# Patient Record
Sex: Female | Born: 1975 | Race: Black or African American | Hispanic: No | Smoking: Former smoker
Health system: Southern US, Community
[De-identification: ages and names within clinical notes are randomized; demographics above are authoritative.]

## PROBLEM LIST (undated history)

## (undated) DIAGNOSIS — I10 Essential (primary) hypertension: Secondary | ICD-10-CM

## (undated) DIAGNOSIS — E7800 Pure hypercholesterolemia, unspecified: Secondary | ICD-10-CM

## (undated) DIAGNOSIS — D219 Benign neoplasm of connective and other soft tissue, unspecified: Secondary | ICD-10-CM

## (undated) DIAGNOSIS — M1990 Unspecified osteoarthritis, unspecified site: Secondary | ICD-10-CM

## (undated) DIAGNOSIS — M3500 Sicca syndrome, unspecified: Secondary | ICD-10-CM

## (undated) DIAGNOSIS — R51 Headache: Secondary | ICD-10-CM

## (undated) DIAGNOSIS — M549 Dorsalgia, unspecified: Secondary | ICD-10-CM

## (undated) DIAGNOSIS — M2550 Pain in unspecified joint: Secondary | ICD-10-CM

## (undated) DIAGNOSIS — M7989 Other specified soft tissue disorders: Secondary | ICD-10-CM

## (undated) DIAGNOSIS — E559 Vitamin D deficiency, unspecified: Secondary | ICD-10-CM

## (undated) HISTORY — DX: Other specified soft tissue disorders: M79.89

## (undated) HISTORY — DX: Dorsalgia, unspecified: M54.9

## (undated) HISTORY — DX: Sjogren syndrome, unspecified: M35.00

## (undated) HISTORY — DX: Pain in unspecified joint: M25.50

## (undated) HISTORY — DX: Benign neoplasm of connective and other soft tissue, unspecified: D21.9

## (undated) HISTORY — DX: Vitamin D deficiency, unspecified: E55.9

## (undated) HISTORY — PX: BACK SURGERY: SHX140

## (undated) HISTORY — DX: Pure hypercholesterolemia, unspecified: E78.00

## (undated) HISTORY — PX: DILATION AND CURETTAGE OF UTERUS: SHX78

## (undated) HISTORY — PX: ABDOMINAL HYSTERECTOMY: SHX81

---
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
## 2006-02-16 ENCOUNTER — Emergency Department (HOSPITAL_COMMUNITY): Admission: EM | Admit: 2006-02-16 | Discharge: 2006-02-16 | Payer: Self-pay | Admitting: Emergency Medicine

## 2006-03-25 ENCOUNTER — Emergency Department (HOSPITAL_COMMUNITY): Admission: EM | Admit: 2006-03-25 | Discharge: 2006-03-25 | Payer: Self-pay | Admitting: Emergency Medicine

## 2006-03-29 ENCOUNTER — Ambulatory Visit (HOSPITAL_COMMUNITY): Admission: RE | Admit: 2006-03-29 | Discharge: 2006-03-29 | Payer: Self-pay | Admitting: Obstetrics & Gynecology

## 2006-03-29 ENCOUNTER — Encounter (INDEPENDENT_AMBULATORY_CARE_PROVIDER_SITE_OTHER): Payer: Self-pay | Admitting: Specialist

## 2006-12-18 ENCOUNTER — Emergency Department (HOSPITAL_COMMUNITY): Admission: EM | Admit: 2006-12-18 | Discharge: 2006-12-19 | Payer: Self-pay | Admitting: Emergency Medicine

## 2006-12-18 IMAGING — CT CT HEAD W/O CM
1 series · 16 of 30 positions shown, 20 images · IV contrast (agent unspecified)
Comparison: none

CLINICAL DATA: 11-weeks pregnant with frontal headache for two days.  History of hypertension.
 HEAD CT WITHOUT CONTRAST:
TECHNIQUE: Contiguous axial images were obtained from the base of the skull through the vertex according to standard protocol without contrast.  The patient was shielded with three lead aprons.

[Series 2: headseq 4.8 h37s · axial · 0.47mm/px · z∈[+142,+277]mm · 16 of 30 slices shown, 20 images]
[im 2/30  brain]
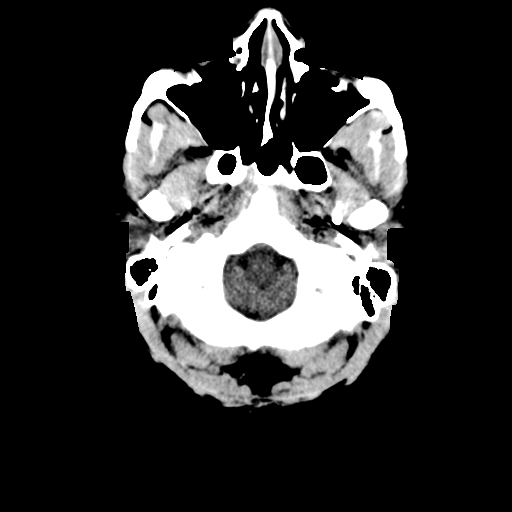
[im 2/30  bone]
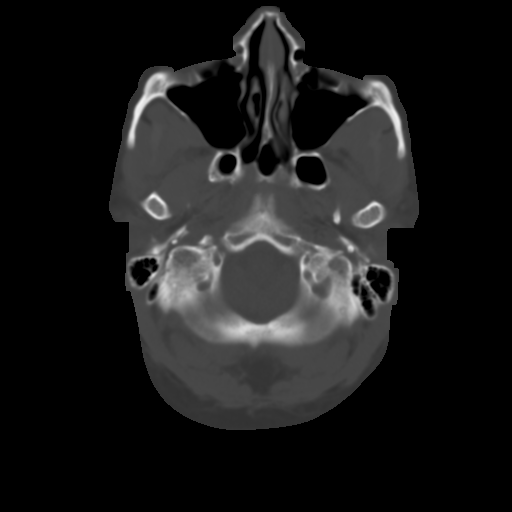
[im 4/30  brain]
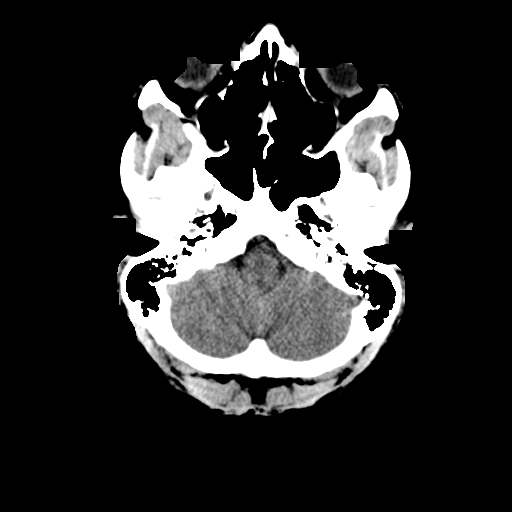
[im 6/30  brain]
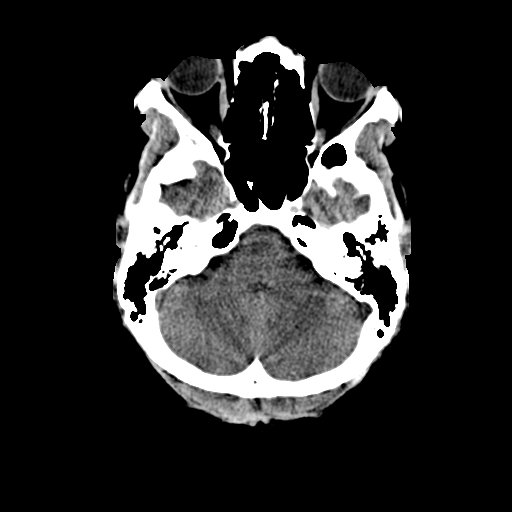
[im 8/30  brain]
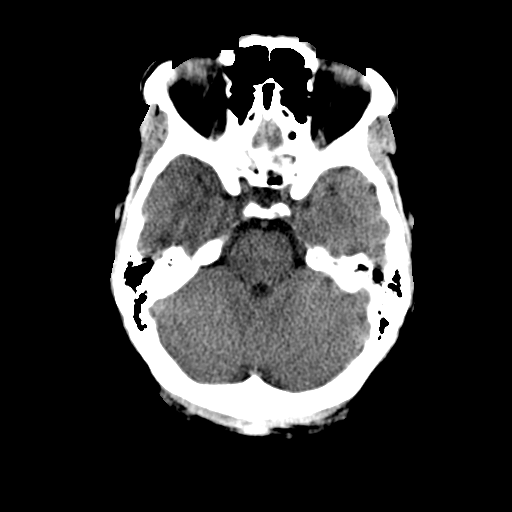
[im 9/30  brain]
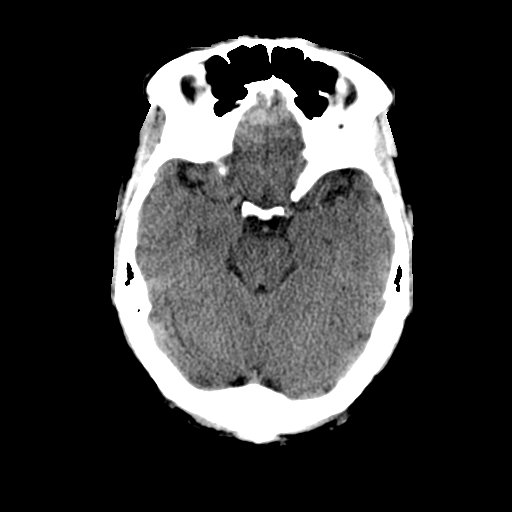
[im 9/30  bone]
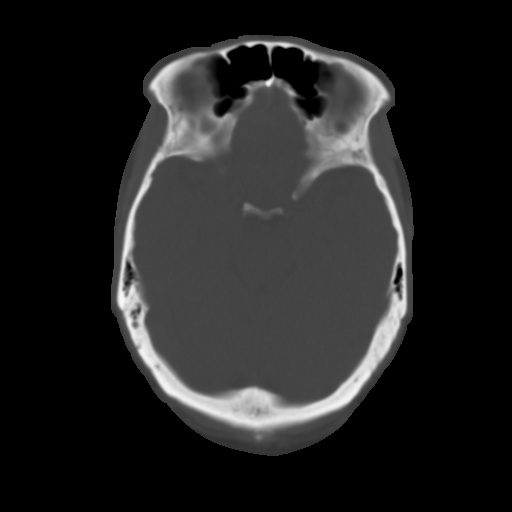
[im 11/30  brain]
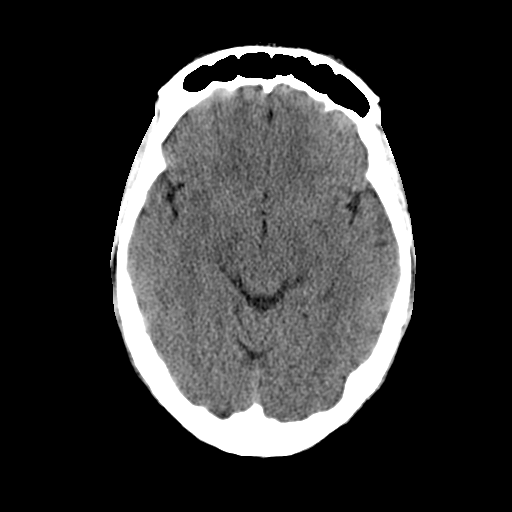
[im 13/30  brain]
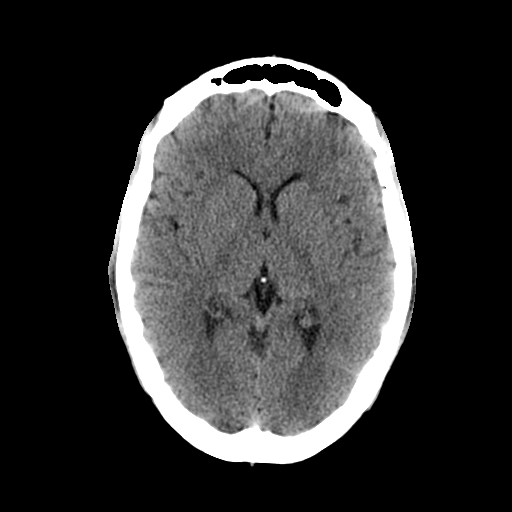
[im 15/30  brain]
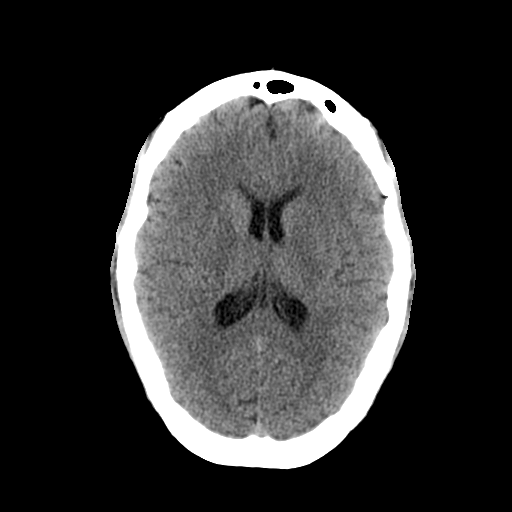
[im 16/30  brain]
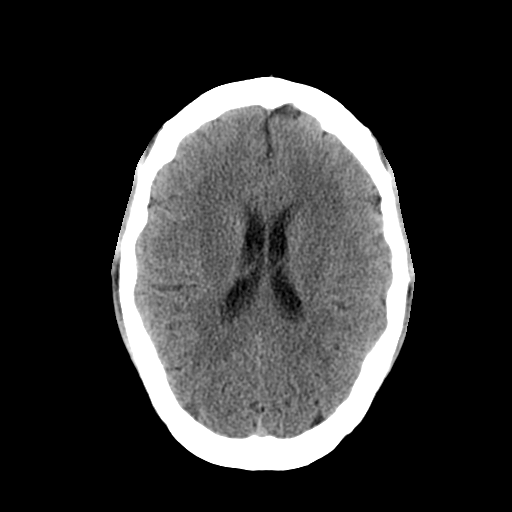
[im 16/30  bone]
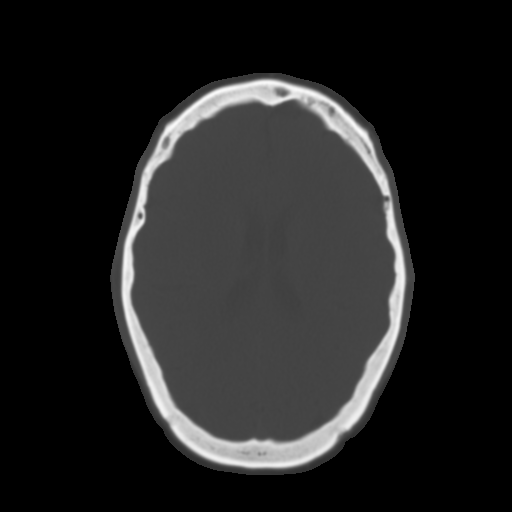
[im 18/30  brain]
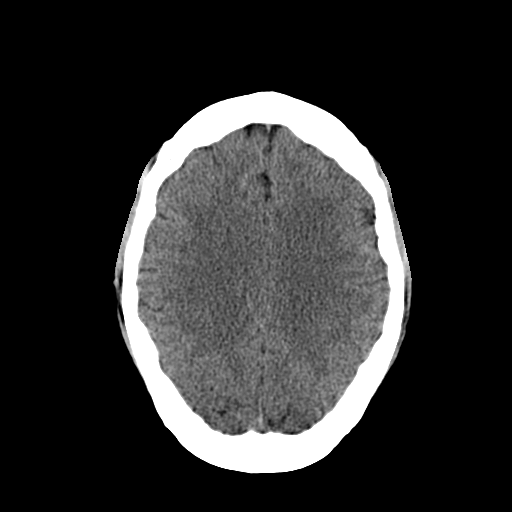
[im 20/30  brain]
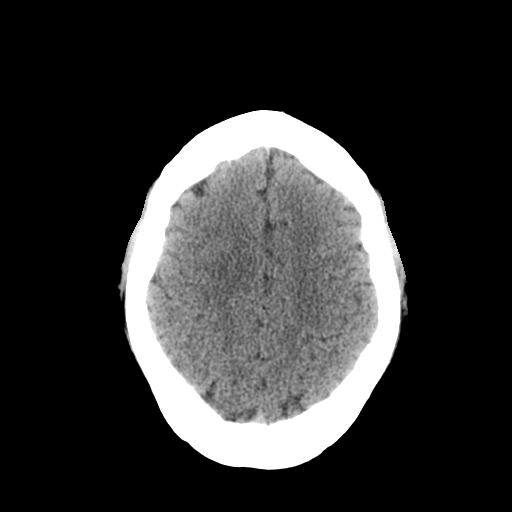
[im 22/30  brain]
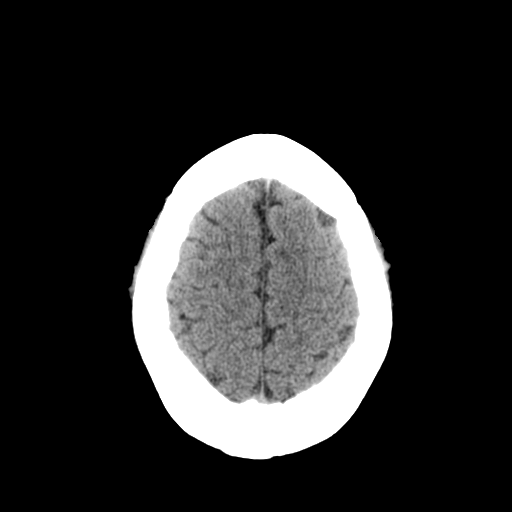
[im 23/30  brain]
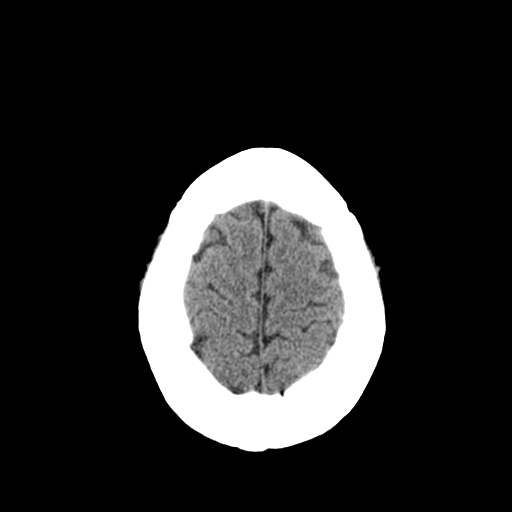
[im 23/30  bone]
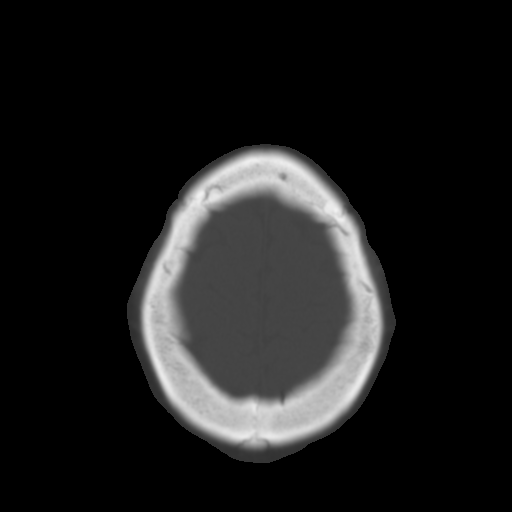
[im 25/30  brain]
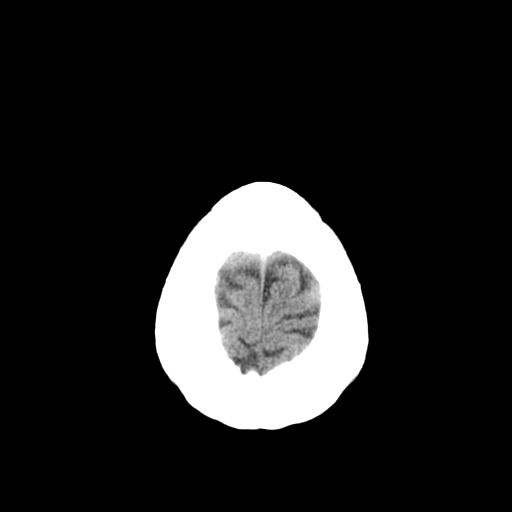
[im 27/30  brain]
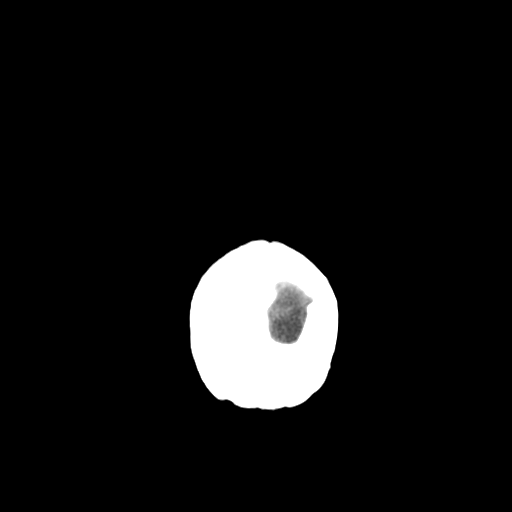
[im 29/30  brain]
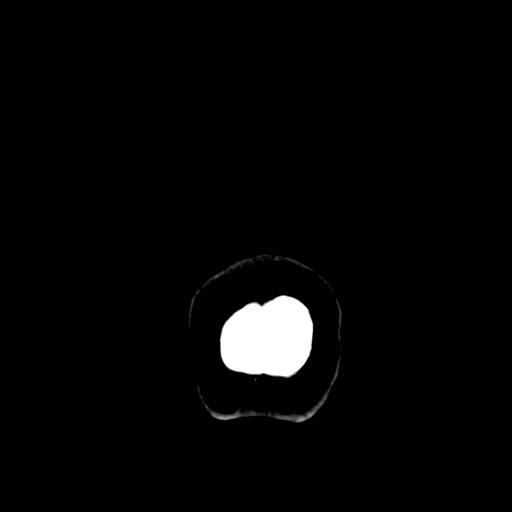

[16 of 30 positions shown; findings below may reference images not displayed]

FINDINGS: There is no evidence of intracranial hemorrhage, brain edema, acute infarct, mass lesion, or mass effect.  No other intra-axial abnormalities are seen, and the ventricles are within normal limits.  No abnormal extra-axial fluid collections or masses are identified.  No skull abnormalities are noted.
IMPRESSION: Negative non-contrast head CT.

## 2010-11-24 NOTE — Op Note (Signed)
Carol Vargas, Carol Vargas               ACCOUNT NO.:  [id number]   MEDICAL RECORD NO.:  [id number]          PATIENT TYPE:  AMB   LOCATION:  DAY                           FACILITY:  [facility]   PHYSICIAN:  Lazaro Arms, M.D.   DATE OF BIRTH:  [birth year]   DATE OF PROCEDURE:  03/29/2006  DATE OF DISCHARGE:  03/29/2006                                 OPERATIVE REPORT   PREOPERATIVE DIAGNOSIS:  Missed abortion in first trimester.   POSTOPERATIVE DIAGNOSIS:  Missed abortion in first trimester.   PROCEDURE:  Suction and sharp D&C.   SURGEON:  Lazaro Arms, MD.   ANESTHESIA:  Mask general.   FINDINGS:  The patient had been seen in the office for the last week or so.  She had had hCG levels, which were significantly declining with no  progression of pregnancy, and no fetal pulse seen.  As a result, she is  admitted for D&C at her request.   DESCRIPTION OF OPERATION:  The patient was taken to the operating room,  placed in the supine position, underwent mask anesthesia, placed in the  dorsal lithotomy position, prepped and draped in the usual sterile fashion.  The vagina was prepped and draped in the usual sterile fashion.  A  paracervical block was placed.  The cervix was grasped and dilated  superiorly to allow passage of an 8-French curved suction curette.  Several  passes were made, and all tissue was removed.  There was a good cry at the  end of the procedure.  The patient tolerated the procedure well, she  experienced minimal blood loss, and was taken to the recovery room in good,  stable condition.  All counts were correct.      Lazaro Arms, M.D.  Electronically Signed     LHE/MEDQ  D:  03/29/2006  T:  03/31/2006  Job:  [id number]

## 2011-04-26 LAB — COMPREHENSIVE METABOLIC PANEL
AST: 25
Albumin: 3.1 — ABNORMAL LOW
BUN: 10
Calcium: 9
Creatinine, Ser: 0.69
GFR calc Af Amer: >60
Total Bilirubin: 0.4
Total Protein: 6.7

## 2011-04-26 LAB — URINALYSIS, ROUTINE W REFLEX MICROSCOPIC
Glucose, UA: NEGATIVE
Hgb urine dipstick: NEGATIVE
Ketones, ur: NEGATIVE
Protein, ur: NEGATIVE
Urobilinogen, UA: 0.2

## 2011-04-26 LAB — CBC
HCT: 36.2
MCV: 90.7
Platelets: 181
RDW: 13.4

## 2011-04-26 LAB — DIFFERENTIAL
Basophils Absolute: 0
Lymphocytes Relative: 22
Lymphs Abs: 2.2
Monocytes Absolute: 0.7
Monocytes Relative: 7
Neutro Abs: 6.7

## 2012-02-28 ENCOUNTER — Telehealth: Payer: Self-pay

## 2012-02-28 NOTE — Telephone Encounter (Signed)
Created in error/cw,cma 

## 2013-07-05 ENCOUNTER — Emergency Department (HOSPITAL_COMMUNITY): Payer: Medicaid Other | Admitting: Anesthesiology

## 2013-07-05 ENCOUNTER — Emergency Department (HOSPITAL_COMMUNITY): Payer: Medicaid Other

## 2013-07-05 ENCOUNTER — Encounter (HOSPITAL_COMMUNITY): Payer: Medicaid Other | Admitting: Anesthesiology

## 2013-07-05 ENCOUNTER — Inpatient Hospital Stay (HOSPITAL_COMMUNITY)
Admission: EM | Admit: 2013-07-05 | Discharge: 2013-07-10 | DRG: 493 | Disposition: A | Discharge status: Home Health | Payer: Medicaid Other | Attending: Orthopedic Surgery | Admitting: Orthopedic Surgery

## 2013-07-05 ENCOUNTER — Encounter (HOSPITAL_COMMUNITY)
Admission: EM | Disposition: A | Discharge status: Home Health | Payer: Self-pay | Source: Home / Self Care | Attending: Orthopedic Surgery

## 2013-07-05 ENCOUNTER — Inpatient Hospital Stay (HOSPITAL_COMMUNITY): Payer: Medicaid Other

## 2013-07-05 ENCOUNTER — Encounter (HOSPITAL_COMMUNITY): Payer: Self-pay | Admitting: Emergency Medicine

## 2013-07-05 DIAGNOSIS — S82109A Unspecified fracture of upper end of unspecified tibia, initial encounter for closed fracture: Principal | ICD-10-CM | POA: Diagnosis present

## 2013-07-05 DIAGNOSIS — S0010XA Contusion of unspecified eyelid and periocular area, initial encounter: Secondary | ICD-10-CM | POA: Diagnosis present

## 2013-07-05 DIAGNOSIS — I10 Essential (primary) hypertension: Secondary | ICD-10-CM | POA: Diagnosis present

## 2013-07-05 DIAGNOSIS — K5900 Constipation, unspecified: Secondary | ICD-10-CM | POA: Diagnosis not present

## 2013-07-05 DIAGNOSIS — E669 Obesity, unspecified: Secondary | ICD-10-CM | POA: Diagnosis present

## 2013-07-05 DIAGNOSIS — S82131A Displaced fracture of medial condyle of right tibia, initial encounter for closed fracture: Secondary | ICD-10-CM

## 2013-07-05 DIAGNOSIS — S82143A Displaced bicondylar fracture of unspecified tibia, initial encounter for closed fracture: Secondary | ICD-10-CM

## 2013-07-05 DIAGNOSIS — Z6841 Body Mass Index (BMI) 40.0 and over, adult: Secondary | ICD-10-CM

## 2013-07-05 DIAGNOSIS — Z8781 Personal history of (healed) traumatic fracture: Secondary | ICD-10-CM | POA: Diagnosis present

## 2013-07-05 DIAGNOSIS — S82141A Displaced bicondylar fracture of right tibia, initial encounter for closed fracture: Secondary | ICD-10-CM

## 2013-07-05 HISTORY — PX: EXTERNAL FIXATION LEG: SHX1549

## 2013-07-05 LAB — COMPREHENSIVE METABOLIC PANEL
Albumin: 3.7 g/dL (ref 3.5–5.2)
Alkaline Phosphatase: 93 U/L (ref 39–117)
BUN: 7 mg/dL (ref 6–23)
CO2: 27 mEq/L (ref 19–32)
Chloride: 100 mEq/L (ref 96–112)
GFR calc non Af Amer: >90 mL/min (ref >90)
Potassium: 3.5 mEq/L (ref 3.5–5.1)
Total Bilirubin: 0.1 mg/dL — ABNORMAL LOW (ref 0.3–1.2)

## 2013-07-05 LAB — CBC
MCH: 29.3 pg (ref 26.0–34.0)
MCV: 87.7 fL (ref 78.0–100.0)
Platelets: 226 10*3/uL (ref 150–400)
RDW: 13.8 % (ref 11.5–15.5)
WBC: 9.9 10*3/uL (ref 4.0–10.5)

## 2013-07-05 LAB — POCT I-STAT, CHEM 8
Chloride: 102 mEq/L (ref 96–112)
HCT: 41 % (ref 36.0–46.0)
Hemoglobin: 13.9 g/dL (ref 12.0–15.0)
Potassium: 3.5 mEq/L (ref 3.5–5.1)
Sodium: 141 mEq/L (ref 135–145)

## 2013-07-05 LAB — POCT PREGNANCY, URINE: Preg Test, Ur: NEGATIVE

## 2013-07-05 LAB — SAMPLE TO BLOOD BANK

## 2013-07-05 LAB — CG4 I-STAT (LACTIC ACID): Lactic Acid, Venous: 1.01 mmol/L (ref 0.5–2.2)
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
## 2013-07-05 SURGERY — EXTERNAL FIXATION, LOWER EXTREMITY
Anesthesia: General | Site: Leg Lower | Laterality: Right

## 2013-07-05 MED ORDER — HYDROMORPHONE HCL PF 1 MG/ML IJ SOLN
INTRAMUSCULAR | Status: AC
  Filled 2013-07-05: qty 1

## 2013-07-05 MED ORDER — IOHEXOL 300 MG/ML  SOLN
125.0000 mL | Freq: Once | INTRAMUSCULAR | Status: AC | PRN
  Administered 2013-07-05: 120 mL via INTRAVENOUS

## 2013-07-05 MED ORDER — HYDROMORPHONE HCL PF 1 MG/ML IJ SOLN
0.2500 mg | INTRAMUSCULAR | Status: DC | PRN
  Administered 2013-07-05 (×4): 0.5 mg via INTRAVENOUS

## 2013-07-05 MED ORDER — LACTATED RINGERS IV SOLN
INTRAVENOUS | Status: DC | PRN
  Administered 2013-07-05: 19:00:00 via INTRAVENOUS

## 2013-07-05 MED ORDER — METHOCARBAMOL 100 MG/ML IJ SOLN
500.0000 mg | Freq: Four times a day (QID) | INTRAVENOUS | Status: DC | PRN
  Filled 2013-07-05: qty 5

## 2013-07-05 MED ORDER — FENTANYL CITRATE 0.05 MG/ML IJ SOLN
INTRAMUSCULAR | Status: DC | PRN
  Administered 2013-07-05: 50 ug via INTRAVENOUS

## 2013-07-05 MED ORDER — SUCCINYLCHOLINE CHLORIDE 20 MG/ML IJ SOLN
INTRAMUSCULAR | Status: DC | PRN
  Administered 2013-07-05: 100 mg via INTRAVENOUS

## 2013-07-05 MED ORDER — ENOXAPARIN SODIUM 40 MG/0.4ML ~~LOC~~ SOLN
40.0000 mg | SUBCUTANEOUS | Status: DC
  Administered 2013-07-05: 40 mg via SUBCUTANEOUS
  Filled 2013-07-05 (×2): qty 0.4

## 2013-07-05 MED ORDER — WHITE PETROLATUM GEL
Status: AC
  Filled 2013-07-05: qty 5

## 2013-07-05 MED ORDER — PROPOFOL 10 MG/ML IV BOLUS: INTRAVENOUS | Status: DC | PRN

## 2013-07-05 MED ORDER — KETOROLAC TROMETHAMINE 30 MG/ML IJ SOLN
30.0000 mg | Freq: Four times a day (QID) | INTRAMUSCULAR | Status: DC | PRN
  Administered 2013-07-05 – 2013-07-06 (×4): 30 mg via INTRAVENOUS
  Filled 2013-07-05 (×4): qty 1

## 2013-07-05 MED ORDER — METHOCARBAMOL 500 MG PO TABS
500.0000 mg | ORAL_TABLET | Freq: Four times a day (QID) | ORAL | Status: DC | PRN
  Administered 2013-07-06 (×2): 500 mg via ORAL
  Filled 2013-07-05 (×3): qty 1

## 2013-07-05 MED ORDER — HYDROMORPHONE HCL PF 1 MG/ML IJ SOLN
1.0000 mg | Freq: Once | INTRAMUSCULAR | Status: AC
  Administered 2013-07-05: 1 mg via INTRAVENOUS

## 2013-07-05 MED ORDER — SORBITOL 70 % SOLN
30.0000 mL | Freq: Every day | Status: DC | PRN
  Administered 2013-07-09: 30 mL via ORAL
  Filled 2013-07-05 (×2): qty 30

## 2013-07-05 MED ORDER — CEFAZOLIN SODIUM-DEXTROSE 2-3 GM-% IV SOLR
INTRAVENOUS | Status: DC | PRN
  Administered 2013-07-05: 2 g via INTRAVENOUS

## 2013-07-05 MED ORDER — SENNA 8.6 MG PO TABS
1.0000 | ORAL_TABLET | Freq: Two times a day (BID) | ORAL | Status: DC
  Administered 2013-07-05 – 2013-07-09 (×8): 8.6 mg via ORAL
  Filled 2013-07-05 (×13): qty 1

## 2013-07-05 MED ORDER — ONDANSETRON HCL 4 MG/2ML IJ SOLN
INTRAMUSCULAR | Status: AC
  Filled 2013-07-05: qty 2

## 2013-07-05 MED ORDER — CEFAZOLIN SODIUM-DEXTROSE 2-3 GM-% IV SOLR
2.0000 g | Freq: Four times a day (QID) | INTRAVENOUS | Status: AC
  Administered 2013-07-06 (×3): 2 g via INTRAVENOUS
  Filled 2013-07-05 (×3): qty 50

## 2013-07-05 MED ORDER — LIDOCAINE HCL (CARDIAC) 20 MG/ML IV SOLN: INTRAVENOUS | Status: DC | PRN

## 2013-07-05 MED ORDER — CEFAZOLIN SODIUM-DEXTROSE 2-3 GM-% IV SOLR
INTRAVENOUS | Status: AC
  Filled 2013-07-05: qty 50

## 2013-07-05 MED ORDER — ACETAMINOPHEN 10 MG/ML IV SOLN
INTRAVENOUS | Status: AC
  Administered 2013-07-05: 1000 mg via INTRAVENOUS
  Filled 2013-07-05: qty 100

## 2013-07-05 MED ORDER — ONDANSETRON HCL 4 MG PO TABS: 4.0000 mg | ORAL_TABLET | Freq: Four times a day (QID) | ORAL | Status: DC | PRN

## 2013-07-05 MED ORDER — ACETAMINOPHEN 10 MG/ML IV SOLN
1000.0000 mg | Freq: Once | INTRAVENOUS | Status: AC
  Administered 2013-07-05: 1000 mg via INTRAVENOUS

## 2013-07-05 MED ORDER — HYDROCODONE-ACETAMINOPHEN 5-325 MG PO TABS
1.0000 | ORAL_TABLET | ORAL | Status: DC | PRN
  Administered 2013-07-05 – 2013-07-06 (×3): 2 via ORAL
  Filled 2013-07-05 (×5): qty 2

## 2013-07-05 MED ORDER — ONDANSETRON HCL 4 MG/2ML IJ SOLN
INTRAMUSCULAR | Status: DC | PRN
  Administered 2013-07-05: 4 mg via INTRAVENOUS

## 2013-07-05 MED ORDER — OXYCODONE HCL 5 MG PO TABS
5.0000 mg | ORAL_TABLET | ORAL | Status: DC | PRN
  Administered 2013-07-06: 10 mg via ORAL
  Administered 2013-07-06 (×2): 15 mg via ORAL
  Administered 2013-07-07: 5 mg via ORAL
  Administered 2013-07-08 – 2013-07-09 (×4): 10 mg via ORAL
  Administered 2013-07-09: 15 mg via ORAL
  Administered 2013-07-09 – 2013-07-10 (×2): 10 mg via ORAL
  Filled 2013-07-05 (×5): qty 2
  Filled 2013-07-05 (×2): qty 3
  Filled 2013-07-05: qty 2
  Filled 2013-07-05: qty 1
  Filled 2013-07-05: qty 3
  Filled 2013-07-05: qty 2

## 2013-07-05 MED ORDER — KETOROLAC TROMETHAMINE 15 MG/ML IJ SOLN
INTRAMUSCULAR | Status: AC
  Administered 2013-07-05: 15 mg
  Filled 2013-07-05: qty 2

## 2013-07-05 MED ORDER — DIPHENHYDRAMINE HCL 12.5 MG/5ML PO ELIX: 25.0000 mg | ORAL_SOLUTION | ORAL | Status: DC | PRN

## 2013-07-05 MED ORDER — SODIUM CHLORIDE 0.9 % IV SOLN
INTRAVENOUS | Status: DC
  Administered 2013-07-05 – 2013-07-06 (×2): via INTRAVENOUS

## 2013-07-05 MED ORDER — MORPHINE SULFATE 2 MG/ML IJ SOLN
1.0000 mg | INTRAMUSCULAR | Status: DC | PRN
  Administered 2013-07-05 – 2013-07-06 (×3): 1 mg via INTRAVENOUS
  Filled 2013-07-05 (×3): qty 1

## 2013-07-05 MED ORDER — LIDOCAINE HCL (CARDIAC) 20 MG/ML IV SOLN
INTRAVENOUS | Status: DC | PRN
  Administered 2013-07-05: 80 mg via INTRAVENOUS

## 2013-07-05 MED ORDER — OXYCODONE HCL 5 MG PO TABS: 5.0000 mg | ORAL_TABLET | Freq: Once | ORAL | Status: DC | PRN

## 2013-07-05 MED ORDER — POLYETHYLENE GLYCOL 3350 17 G PO PACK
17.0000 g | PACK | Freq: Every day | ORAL | Status: DC | PRN
  Administered 2013-07-07: 17 g via ORAL
  Filled 2013-07-05 (×2): qty 1

## 2013-07-05 MED ORDER — MAGNESIUM CITRATE PO SOLN
1.0000 | Freq: Once | ORAL | Status: AC | PRN
  Filled 2013-07-05: qty 296

## 2013-07-05 MED ORDER — OXYCODONE HCL 5 MG/5ML PO SOLN: 5.0000 mg | Freq: Once | ORAL | Status: DC | PRN

## 2013-07-05 MED ORDER — ONDANSETRON HCL 4 MG/2ML IJ SOLN: 4.0000 mg | Freq: Once | INTRAMUSCULAR | Status: DC | PRN

## 2013-07-05 MED ORDER — METOCLOPRAMIDE HCL 5 MG/ML IJ SOLN: 5.0000 mg | Freq: Three times a day (TID) | INTRAMUSCULAR | Status: DC | PRN

## 2013-07-05 MED ORDER — KETOROLAC TROMETHAMINE 30 MG/ML IJ SOLN: 30.0000 mg | Freq: Once | INTRAMUSCULAR | Status: DC

## 2013-07-05 MED ORDER — METOCLOPRAMIDE HCL 5 MG PO TABS: 5.0000 mg | ORAL_TABLET | Freq: Three times a day (TID) | ORAL | Status: DC | PRN

## 2013-07-05 MED ORDER — PROPOFOL 10 MG/ML IV BOLUS
INTRAVENOUS | Status: DC | PRN
  Administered 2013-07-05: 340 mg via INTRAVENOUS

## 2013-07-05 MED ORDER — HYDROMORPHONE HCL PF 1 MG/ML IJ SOLN
1.0000 mg | Freq: Once | INTRAMUSCULAR | Status: DC
  Filled 2013-07-05: qty 1

## 2013-07-05 MED ORDER — ONDANSETRON HCL 4 MG/2ML IJ SOLN
4.0000 mg | Freq: Once | INTRAMUSCULAR | Status: AC
  Administered 2013-07-05: 4 mg via INTRAVENOUS

## 2013-07-05 MED ORDER — KETOROLAC TROMETHAMINE 30 MG/ML IJ SOLN
INTRAMUSCULAR | Status: AC
  Filled 2013-07-05: qty 1

## 2013-07-05 MED ORDER — TETANUS-DIPHTH-ACELL PERTUSSIS 5-2.5-18.5 LF-MCG/0.5 IM SUSP
0.5000 mL | Freq: Once | INTRAMUSCULAR | Status: AC
  Administered 2013-07-05: 0.5 mL via INTRAMUSCULAR
  Filled 2013-07-05: qty 0.5

## 2013-07-05 MED ORDER — ONDANSETRON HCL 4 MG/2ML IJ SOLN
4.0000 mg | Freq: Four times a day (QID) | INTRAMUSCULAR | Status: DC | PRN
  Administered 2013-07-06: 4 mg via INTRAVENOUS
  Filled 2013-07-05: qty 2

## 2013-07-05 SURGICAL SUPPLY — 33 items
300 CARBON BAR ×2 IMPLANT
400 CARBON BAR ×2 IMPLANT
5MM PIN TO BAR CLAMP ×8 IMPLANT
BANDAGE GAUZE ELAST BULKY 4 IN (GAUZE/BANDAGES/DRESSINGS) ×2 IMPLANT
BAR EXFIX SM BONE 10.5X300 (Trauma) ×2 IMPLANT
BAR EXFIX SM BONE 10.5X400 (Trauma) ×4 IMPLANT
BAR EXFX 400 NS DISP CFBR (Trauma) ×2 IMPLANT
BAR TO BAR CLAMP ×2 IMPLANT
BIT DRILL 3.5 SHOFT HALF PIN (BIT) ×2 IMPLANT
CLAMP BAR TO BAR (Clamp) ×8 IMPLANT
COVER LIGHT HANDLE  DEROYL (MISCELLANEOUS) ×2 IMPLANT
CUFF TOURNIQUET SINGLE 34IN LL (TOURNIQUET CUFF) ×2 IMPLANT
DRAPE C-ARM 42X72 X-RAY (DRAPES) ×2 IMPLANT
DRAPE C-ARMOR (DRAPES) ×2 IMPLANT
DRAPE SURG 17X23 STRL (DRAPES) ×2 IMPLANT
DRAPE U-SHAPE 47X51 STRL (DRAPES) ×2 IMPLANT
GAUZE XEROFORM 1X8 LF (GAUZE/BANDAGES/DRESSINGS) ×2 IMPLANT
GLOVE BIO SURGEON STRL SZ 6 (GLOVE) ×2 IMPLANT
GLOVE BIO SURGEON STRL SZ7.5 (GLOVE) ×2 IMPLANT
GLOVE BIOGEL PI IND STRL 7.0 (GLOVE) ×2 IMPLANT
GLOVE BIOGEL PI IND STRL 8 (GLOVE) ×2 IMPLANT
GLOVE BIOGEL PI INDICATOR 7.0 (GLOVE) ×2
GLOVE BIOGEL PI INDICATOR 8 (GLOVE) ×2
GOWN EXTRA PROTECTION XL (GOWNS) ×2 IMPLANT
GOWN STRL NON-REIN LRG LVL3 (GOWN DISPOSABLE) ×4 IMPLANT
KIT BASIN OR (CUSTOM PROCEDURE TRAY) ×2 IMPLANT
PACK ORTHO EXTREMITY (CUSTOM PROCEDURE TRAY) ×2 IMPLANT
PAD ARMBOARD 7.5X6 YLW CONV (MISCELLANEOUS) ×2 IMPLANT
PIN HALF 5X40 (EXFIX) ×2 IMPLANT
SPONGE GAUZE 4X4 12PLY (GAUZE/BANDAGES/DRESSINGS) ×2 IMPLANT
SPONGE LAP 4X18 X RAY DECT (DISPOSABLE) ×2 IMPLANT
TOWEL OR 17X24 6PK STRL BLUE (TOWEL DISPOSABLE) ×2 IMPLANT
TOWEL OR 17X26 10 PK STRL BLUE (TOWEL DISPOSABLE) ×2 IMPLANT

## 2013-07-05 NOTE — Anesthesia Preprocedure Evaluation (Addendum)
Anesthesia Evaluation  Patient identified by MRN, date of birth, ID band Patient awake    Reviewed: Allergy & Precautions, H&P , NPO status , Patient's Chart, lab work & pertinent test results  Airway Mallampati: I TM Distance: >3 FB Neck ROM: Full    Dental  (+) Teeth Intact and Dental Advisory Given   Pulmonary  breath sounds clear to auscultation        Cardiovascular Rhythm:Regular Rate:Normal     Neuro/Psych    GI/Hepatic   Endo/Other  Morbid obesity  Renal/GU      Musculoskeletal   Abdominal   Peds  Hematology   Anesthesia Other Findings Recent tooth extraction  Reproductive/Obstetrics                           Anesthesia Physical Anesthesia Plan  ASA: II and emergent  Anesthesia Plan:    Post-op Pain Management:    Induction: Intravenous, Rapid sequence and Cricoid pressure planned  Airway Management Planned: Oral ETT  Additional Equipment:   Intra-op Plan:   Post-operative Plan: Extubation in OR  Informed Consent: I have reviewed the patients History and Physical, chart, labs and discussed the procedure including the risks, benefits and alternatives for the proposed anesthesia with the patient or authorized representative who has indicated his/her understanding and acceptance.   Dental advisory given  Plan Discussed with: CRNA, Anesthesiologist and Surgeon  Anesthesia Plan Comments:         Anesthesia Quick Evaluation
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
## 2013-07-05 NOTE — H&P (Signed)
H&P update  The surgical history has been reviewed and remains accurate without interval change.  The patient was re-examined and patient's physiologic condition has not changed significantly in the last 30 days. The condition still exists that makes this procedure necessary. The treatment plan remains the same, without new options for care.  No new pharmacological allergies or types of therapy has been initiated that would change the plan or the appropriateness of the plan.  The patient and/or family understand the potential benefits and risks.  Mayra Reel, MD 07/05/2013 6:01 PM

## 2013-07-05 NOTE — Addendum Note (Signed)
Addendum created 07/05/13 2027 by Kerby Nora, MD   Modules edited: Anesthesia Attestations

## 2013-07-05 NOTE — Progress Notes (Signed)
Orthopedic Tech Progress Note Patient Details:  Carol Vargas 24-Sep-1975 [id number]  Ortho Devices Type of Ortho Device: Knee Immobilizer Ortho Device/Splint Location: ohf on bed Ortho Device/Splint Interventions: Ordered;Application   Jennye Moccasin 07/05/2013, 9:49 PM

## 2013-07-05 NOTE — Progress Notes (Signed)
Orthopedic Tech Progress Note Patient Details:  Carol Vargas [birth year] [id number]  Ortho Devices Type of Ortho Device: Knee Immobilizer Ortho Device/Splint Location: RLE Ortho Device/Splint Interventions: Ordered;Application   Jennye Moccasin 07/05/2013, 4:59 PM
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
## 2013-07-05 NOTE — ED Notes (Signed)
Patient in radiology from 1500 - 1630

## 2013-07-05 NOTE — Anesthesia Postprocedure Evaluation (Signed)
  Anesthesia Post-op Note  Patient: Carol Vargas  Procedure(s) Performed: Procedure(s): EXTERNAL FIXATION LEG (Right)  Patient Location: PACU  Anesthesia Type:General  Level of Consciousness: awake, alert  and oriented  Airway and Oxygen Therapy: Patient Spontanous Breathing and Patient connected to nasal cannula oxygen  Post-op Pain: mild  Post-op Assessment: Post-op Vital signs reviewed  Post-op Vital Signs: Reviewed  Complications: No apparent anesthesia complications

## 2013-07-05 NOTE — ED Notes (Signed)
Obvious deformity noted to R knee, 10/10 pain, becomes worse with movement. Hematoma noted to R eyebrow. Pt is alert and oriented x4 at the time.

## 2013-07-05 NOTE — Progress Notes (Signed)
D; all pain meds given, remains pain. A; notiified MD, tylenol 1g IV ordered

## 2013-07-05 NOTE — ED Provider Notes (Signed)
I assumed care in signout Trauma imaging negative Pt has right periorbital hematoma but no evidence of orbital injury Pt denies any chest/abd pain D/w ortho dr Fayrene Fearing, will admit to OR  Joya Gaskins, MD 07/05/13 1731

## 2013-07-05 NOTE — Transfer of Care (Signed)
Immediate Anesthesia Transfer of Care Note  Patient: Carol Vargas  Procedure(s) Performed: Procedure(s): EXTERNAL FIXATION LEG (Right)  Patient Location: PACU  Anesthesia Type:General  Level of Consciousness: awake, alert  and oriented  Airway & Oxygen Therapy: Patient Spontanous Breathing  Post-op Assessment: Report given to PACU RN and Post -op Vital signs reviewed and stable  Post vital signs: Reviewed and stable  Complications: No apparent anesthesia complications
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
## 2013-07-06 ENCOUNTER — Inpatient Hospital Stay (HOSPITAL_COMMUNITY): Payer: Medicaid Other

## 2013-07-06 DIAGNOSIS — I10 Essential (primary) hypertension: Secondary | ICD-10-CM

## 2013-07-06 DIAGNOSIS — E669 Obesity, unspecified: Secondary | ICD-10-CM | POA: Diagnosis present
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
## 2013-07-06 MED ORDER — HYDRALAZINE HCL 20 MG/ML IJ SOLN
20.0000 mg | Freq: Four times a day (QID) | INTRAMUSCULAR | Status: DC | PRN
Start: 2013-07-06 — End: 2013-07-10

## 2013-07-06 MED ORDER — DEXTROSE 5 % IV SOLN
3.0000 g | Freq: Once | INTRAVENOUS | Status: AC
  Administered 2013-07-07: 3 g via INTRAVENOUS
  Filled 2013-07-06: qty 3000

## 2013-07-06 MED ORDER — METHOCARBAMOL 500 MG PO TABS
500.0000 mg | ORAL_TABLET | Freq: Four times a day (QID) | ORAL | Status: DC | PRN
  Administered 2013-07-08 – 2013-07-09 (×2): 500 mg via ORAL
  Filled 2013-07-06 (×2): qty 1

## 2013-07-06 MED ORDER — PNEUMOCOCCAL VAC POLYVALENT 25 MCG/0.5ML IJ INJ
0.5000 mL | INJECTION | INTRAMUSCULAR | Status: DC
  Filled 2013-07-06: qty 0.5

## 2013-07-06 MED ORDER — METHOCARBAMOL 100 MG/ML IJ SOLN
500.0000 mg | Freq: Four times a day (QID) | INTRAVENOUS | Status: DC | PRN
  Administered 2013-07-06: 1000 mg via INTRAVENOUS
  Filled 2013-07-06 (×2): qty 10

## 2013-07-06 MED ORDER — OXYCODONE-ACETAMINOPHEN 5-325 MG PO TABS
1.0000 | ORAL_TABLET | Freq: Four times a day (QID) | ORAL | Status: DC | PRN
  Administered 2013-07-06 – 2013-07-07 (×3): 2 via ORAL
  Administered 2013-07-07: 1 via ORAL
  Administered 2013-07-08 – 2013-07-10 (×5): 2 via ORAL
  Filled 2013-07-06 (×4): qty 2
  Filled 2013-07-06: qty 1
  Filled 2013-07-06 (×4): qty 2

## 2013-07-06 MED ORDER — LISINOPRIL 5 MG PO TABS
5.0000 mg | ORAL_TABLET | Freq: Every day | ORAL | Status: DC
  Administered 2013-07-06 – 2013-07-09 (×3): 5 mg via ORAL
  Filled 2013-07-06 (×4): qty 1

## 2013-07-06 NOTE — Care Management Note (Signed)
  Page 2 of 2   07/10/2013     10:50:05 AM   CARE MANAGEMENT NOTE 07/10/2013  Patient:  Carol Vargas, Carol Vargas   Account Number:  [id number]  Date Initiated:  07/06/2013  Documentation initiated by:  Ronny Flurry  Subjective/Objective Assessment:     Action/Plan:   Anticipated DC Date:  07/10/2013   Anticipated DC Plan:  HOME W HOME HEALTH SERVICES         Choice offered to / List presented to:     DME arranged  3-N-1  WALKER - ROLLING  WHEELCHAIR - MANUAL      DME agency  [facility]     [facility] arranged  HH-1 RN      [facility] agency  [facility]   Status of service:  Completed, signed off Medicare Important Message given?   (If response is "NO", the following Medicare IM given date fields will be blank) Date Medicare IM given:   Date Additional Medicare IM given:    Discharge Disposition:    Per UR Regulation:    If discussed at Long Length of Stay Meetings, dates discussed:    Comments:  07-10-12 Exceptions for Percocet and Oxy IR entered into MATCH by Carlyle Lipa CM . [facility] and Jill Alexanders both aware of discharge today . Ronny Flurry RN BSN [phone]    07-08-13 Lovenox 40 mg SQ daily x 4 weeks ( 28 days ) costs $281.81 , which will be covered by [facility] program . Gave patient MATCH letter valid 07-09-13 through 07-16-13 .  Gave patient for [facility], states she will call to schedule an appointment .  [address] [place] phone [phone]  Ronny Flurry RN BSN [phone]       07-06-13 Patient does not met medicaid guide lines for HHPT / HHOT , DR XU aware . HHRN ordered . Patient and bedside nurse aware  Ronny Flurry RN BSN [phone]   07-06-13 Face sheet information correct , Husband's cell is the number listed [phone] , home [phone] . Referral given to [facility] , checking to see if patient has a medicaid qualifing DX for HHPT / OT awaiting call back. Ronny Flurry RN BSN [phone]

## 2013-07-06 NOTE — Progress Notes (Signed)
   Subjective:  Patient reports pain as moderate.  HTN overnight  Objective:   VITALS:   Filed Vitals:   07/05/13 2301 07/06/13 0001 07/06/13 0428 07/06/13 0609  BP:  147/104 140/92 137/79  Pulse:  68 66 75  Temp:  97.8 F (36.6 C) 97.9 F (36.6 C)   TempSrc:  Oral Oral   Resp:  20 20   Height: 5\' 8"  (1.727 m)     Weight: 126.9 kg (279 lb 12.2 oz)     SpO2:  100% 100%     Neurologically intact Neurovascular intact Sensation intact distally Intact pulses distally Dorsiflexion/Plantar flexion intact Incision: dressing C/D/I and no drainage No cellulitis present Compartment soft   Lab Results  Component Value Date   WBC 9.9 07/05/2013   HGB 13.9 07/05/2013   HCT 41.0 07/05/2013   MCV 87.7 07/05/2013   PLT 226 07/05/2013     Assessment/Plan: 1 Day Post-Op   Problem List Items Addressed This Visit   None    Visit Diagnoses   Right medial tibial plateau fracture, closed, initial encounter    -  Primary       Advance diet Up with PT/OT DVT ppx - SCDs, ambulation, lovenox NWB right and lower extremity Pain control Dr. Carola Frost has been consulted for definitive management of injury Will consult hospitalist for HTN Foot plate ordered   Carol Vargas 07/06/2013, 8:07 AM [phone]
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
## 2013-07-06 NOTE — Progress Notes (Addendum)
Clinical Social Work Department BRIEF PSYCHOSOCIAL ASSESSMENT 07/06/2013  Patient:  Carol Vargas, Carol Vargas     Account Number:  [id number]     Admit date:  07/05/2013  Clinical Social Worker:  Varney Biles  Date/Time:  07/06/2013 12:37 PM  Referred by:  Physician  Date Referred:  07/06/2013 Referred for  SNF Placement   Other Referral:   Interview type:  Patient Other interview type:    PSYCHOSOCIAL DATA Living Status:  FAMILY Admitted from facility:   Level of care:   Primary support name:  Henli Hey [phone]) Primary support relationship to patient:  SPOUSE Degree of support available:   Good--pt lives with spouse and children.    CURRENT CONCERNS Current Concerns  Post-Acute Placement   Other Concerns:    SOCIAL WORK ASSESSMENT / PLAN Pt works at [employer], and expressed understanding of SNF placement process. Pt expressed that because she does not have insurance, she understands that she might need to go to SNF further from home based on availability/willingness to work with letter of guarantee patient. Pt also mentioned her daughter is going to SNF she works for to pick up [employer] paperwork; CSW also offered to write letter verifying pt's hospitalization on hospital letterhead. Pt states this would be very helpful, and CSW has typed a letter and will present this to pt and will fax it to [facility] if pt wishes, providing pt with the original letter.  Addendum: CSW left voicemail for financial counselor providing pt's name and room number and that pt is requesting assistance applying for Medicaid.   Assessment/plan status:  Psychosocial Support/Ongoing Assessment of Needs Other assessment/ plan:   Information/referral to community resources:   SNF    PATIENT'S/FAMILY'S RESPONSE TO PLAN OF CARE: Pt friendly and understanding of CSW role in discharge and SNF process, as she works for [employer].       Maryclare Labrador, MSW, [facility] Clinical  Social Worker [phone]

## 2013-07-06 NOTE — Evaluation (Addendum)
Occupational Therapy Evaluation Patient Details Name: Carol Vargas MRN: [id number] DOB: December 12, 1975 Today's Date: 07/06/2013 Time: 2130-8657 OT Time Calculation (min): 43 min  OT Assessment / Plan / Recommendation History of present illness 37 y.o. s/p EXTERNAL FIXATION LEG (Right)   Clinical Impression   Pt presents with below problem list. Pt independent with ADLs, PTA. Pt will benefit from acute OT to increase independence prior to d/c. Foot strap made for RLE.    OT Assessment  Patient needs continued OT Services    Follow Up Recommendations  Home health OT;Supervision/Assistance - 24 hour    Barriers to Discharge      Equipment Recommendations  3 in 1 bedside comode    Recommendations for Other Services    Frequency  Min 2X/week    Precautions / Restrictions Precautions Precautions: Fall Required Braces or Orthoses:  (foot strap to RLE) Restrictions Weight Bearing Restrictions: Yes RLE Weight Bearing: Non weight bearing   Pertinent Vitals/Pain Pain 6/10. Repositioned.    ADL  Grooming: Set up Where Assessed - Grooming: Supported sitting Upper Body Dressing: Set up Where Assessed - Upper Body Dressing: Supported sitting Lower Body Dressing: Moderate assistance Where Assessed - Lower Body Dressing: Supported sit to stand Toilet Transfer: Minimal assistance;Min guard Toilet Transfer Method: Sit to stand;Stand pivot Acupuncturist: Bedside commode Toileting - Clothing Manipulation and Hygiene: Moderate assistance Where Assessed - Toileting Clothing Manipulation and Hygiene: Sit to stand from 3-in-1 or toilet Tub/Shower Transfer Method: Not assessed Equipment Used: Gait belt;Rolling walker Transfers/Ambulation Related to ADLs: Min A for stand pivot transfer to [facility]. Min guard for sit <> stand transfers. Pt able to hop in session.  ADL Comments: Educated to sit for bathing and also for dressing technique. Pt educated to wear foot strap all the time and she  can remove if her foot needs a break. Spoke with pt about use of reacher.     OT Diagnosis: Acute pain  OT Problem List: Decreased strength;Decreased range of motion;Decreased activity tolerance;Impaired balance (sitting and/or standing);Decreased knowledge of precautions;Decreased knowledge of use of DME or AE;Pain OT Treatment Interventions: Self-care/ADL training;DME and/or AE instruction;Therapeutic activities;Patient/family education;Balance training   OT Goals(Current goals can be found in the care plan section) Acute Rehab OT Goals Patient Stated Goal: get back to normal OT Goal Formulation: With patient Time For Goal Achievement: 07/13/13 Potential to Achieve Goals: Good ADL Goals Pt Will Perform Lower Body Bathing: with set-up;with supervision;sit to/from stand Pt Will Perform Lower Body Dressing: with set-up;with supervision;sit to/from stand Pt Will Transfer to Toilet: with modified independence;ambulating (3 in 1 over commode) Pt Will Perform Toileting - Clothing Manipulation and hygiene: with modified independence;sit to/from stand Additional ADL Goal #1: Pt will perform bed mobility at Mod I level as precursor for ADLs.  Additional ADL Goal #2: Pt will tolerate foot strap on RLE.   Visit Information  Last OT Received On: 07/06/13 Assistance Needed: +1 History of Present Illness: 37 y.o. s/p EXTERNAL FIXATION LEG (Right)       Prior Functioning     Home Living Family/patient expects to be discharged to:: Private residence Living Arrangements: Children Available Help at Discharge: Family;Available 24 hours/day Type of Home: House Home Access: Stairs to enter [employer] of Steps: 1 Entrance Stairs-Rails: None Home Layout: One level Home Equipment: None Prior Function Level of Independence: Independent Communication Communication: No difficulties         Vision/Perception     Cognition  Cognition Arousal/Alertness: Awake/alert Behavior  During Therapy:  WFL for tasks assessed/performed Overall Cognitive Status: Within Functional Limits for tasks assessed    Extremity/Trunk Assessment Upper Extremity Assessment Upper Extremity Assessment: Overall WFL for tasks assessed Lower Extremity Assessment Lower Extremity Assessment: Defer to PT evaluation RLE Deficits / Details: R LE in external fixation at knee.  Ankle/hip ROM and strength limited by pain and edema.       Mobility Bed Mobility Bed Mobility: Supine to Sit Supine to Sit: 3: Mod assist Details for Bed Mobility Assistance: Assisted with RLE and also helped with trunk minimally. Transfers Transfers: Sit to Stand;Stand to Sit Sit to Stand: 4: Min guard;With upper extremity assist;From bed;From chair/3-in-1 Stand to Sit: 4: Min guard;To chair/3-in-1 Details for Transfer Assistance: Min guard for safety. Cues for technique. Min A for stand pivot to [facility]     Exercise        End of Session OT - End of Session Equipment Utilized During Treatment: Gait belt;Rolling walker Activity Tolerance: Patient tolerated treatment well Patient left: in chair;with call bell/phone within reach Nurse Communication: Other (comment);Mobility status (spoke with her about foot strap)  GO     Earlie Raveling OTR/L [phone] 07/06/2013, 1:02 PM

## 2013-07-06 NOTE — Progress Notes (Signed)
Orthopedic Tech Progress Note Patient Details:  Carol Vargas 29-Jul-1975 [id number] OHf application documented and completed by Ethelene Browns Patient ID: Star Age, female   DOB: [birth year], 37 y.o.   MRN: [id number]   Orie Rout 07/06/2013, 7:07 AM

## 2013-07-06 NOTE — Evaluation (Signed)
Physical Therapy Evaluation Patient Details Name: Carol Vargas MRN: [id number] DOB: 01-Oct-1975 Today's Date: 07/06/2013 Time: 1150-1208 PT Time Calculation (min): 18 min  PT Assessment / Plan / Recommendation History of Present Illness  37 y.o. s/p EXTERNAL FIXATION LEG (Right)  Clinical Impression  Pt very motivated, but limited by feeling nauseated and "medicine head".  Pt does very well maintaining NWBing on R LE during standing activity.  At this time pt feels she will not need a W/C at home, she feels she will be able to "hop" the distances she needs around her home.  Will continue to follow.      PT Assessment  Patient needs continued PT services    Follow Up Recommendations  Home health PT;Supervision/Assistance - 24 hour    Does the patient have the potential to tolerate intense rehabilitation      Barriers to Discharge        Equipment Recommendations  Rolling walker with 5" wheels;3in1 (PT)    Recommendations for Other Services     Frequency Min 3X/week    Precautions / Restrictions Precautions Precautions: Fall Required Braces or Orthoses:  (foot strap to RLE) Restrictions Weight Bearing Restrictions: Yes RLE Weight Bearing: Non weight bearing   Pertinent Vitals/Pain 6-7/10 R LE.        Mobility  Bed Mobility Bed Mobility: Supine to Sit Supine to Sit: 3: Mod assist Details for Bed Mobility Assistance: Assisted with RLE and also helped with trunk minimally. Transfers Transfers: Sit to Stand;Stand to Sit Sit to Stand: 4: Min guard;With upper extremity assist;From bed;From chair/3-in-1 Stand to Sit: 4: Min guard;To chair/3-in-1 Details for Transfer Assistance: Min guard for safety. Cues for technique. Ambulation/Gait Ambulation/Gait Assistance: Not tested (comment) Stairs: No Wheelchair Mobility Wheelchair Mobility: No    Exercises     PT Diagnosis: Difficulty walking;Acute pain  PT Problem List: Decreased strength;Decreased activity  tolerance;Decreased balance;Decreased mobility;Decreased knowledge of use of DME;Decreased knowledge of precautions;Obesity;Pain PT Treatment Interventions: DME instruction;Gait training;Stair training;Functional mobility training;Therapeutic activities;Therapeutic exercise;Balance training;Patient/family education     PT Goals(Current goals can be found in the care plan section) Acute Rehab PT Goals Patient Stated Goal: get back to normal PT Goal Formulation: With patient Time For Goal Achievement: 07/20/13 Potential to Achieve Goals: Good  Visit Information  Last PT Received On: 07/06/13 Assistance Needed: +1 History of Present Illness: 38 y.o. s/p EXTERNAL FIXATION LEG (Right)       Prior Functioning  Home Living Family/patient expects to be discharged to:: Private residence Living Arrangements: Children Available Help at Discharge: Family;Available 24 hours/day Type of Home: House Home Access: Stairs to enter [employer] of Steps: 1 Entrance Stairs-Rails: None Home Layout: One level Home Equipment: None Prior Function Level of Independence: Independent Communication Communication: No difficulties    Cognition  Cognition Arousal/Alertness: Awake/alert Behavior During Therapy: WFL for tasks assessed/performed Overall Cognitive Status: Within Functional Limits for tasks assessed    Extremity/Trunk Assessment Upper Extremity Assessment Upper Extremity Assessment: Overall WFL for tasks assessed Lower Extremity Assessment Lower Extremity Assessment: Defer to PT evaluation RLE Deficits / Details: R LE in external fixation at knee.  Ankle/hip ROM and strength limited by pain and edema.     Balance Balance Balance Assessed: Yes Static Standing Balance Static Standing - Balance Support: Bilateral upper extremity supported Static Standing - Level of Assistance: 5: Stand by assistance  End of Session PT - End of Session Equipment Utilized During Treatment: Gait  belt Activity Tolerance: Patient limited by pain (Limited by  nausea) Patient left: in chair;with call bell/phone within reach Nurse Communication: Mobility status  GP     Sunny Schlein, [place] [phone] 07/06/2013, 1:03 PM

## 2013-07-07 ENCOUNTER — Encounter (HOSPITAL_COMMUNITY)
Admission: EM | Disposition: A | Discharge status: Home Health | Payer: Self-pay | Source: Home / Self Care | Attending: Orthopedic Surgery

## 2013-07-07 ENCOUNTER — Inpatient Hospital Stay (HOSPITAL_COMMUNITY): Payer: Medicaid Other

## 2013-07-07 ENCOUNTER — Encounter (HOSPITAL_COMMUNITY): Payer: Medicaid Other | Admitting: Anesthesiology

## 2013-07-07 ENCOUNTER — Encounter (HOSPITAL_COMMUNITY): Payer: Self-pay | Admitting: Anesthesiology

## 2013-07-07 ENCOUNTER — Inpatient Hospital Stay (HOSPITAL_COMMUNITY): Payer: Medicaid Other | Admitting: Anesthesiology

## 2013-07-07 DIAGNOSIS — S82109A Unspecified fracture of upper end of unspecified tibia, initial encounter for closed fracture: Principal | ICD-10-CM

## 2013-07-07 HISTORY — PX: EXTERNAL FIXATION LEG: SHX1549

## 2013-07-07 LAB — CBC
HCT: 31.7 % — ABNORMAL LOW (ref 36.0–46.0)
Hemoglobin: 10.4 g/dL — ABNORMAL LOW (ref 12.0–15.0)
MCH: 29.1 pg (ref 26.0–34.0)
RBC: 3.58 MIL/uL — ABNORMAL LOW (ref 3.87–5.11)
RDW: 13.8 % (ref 11.5–15.5)
WBC: 10.5 10*3/uL (ref 4.0–10.5)

## 2013-07-07 LAB — BASIC METABOLIC PANEL
BUN: 7 mg/dL (ref 6–23)
Calcium: 8.3 mg/dL — ABNORMAL LOW (ref 8.4–10.5)
Chloride: 102 mEq/L (ref 96–112)
Creatinine, Ser: 1 mg/dL (ref 0.50–1.10)
GFR calc Af Amer: 81 mL/min — ABNORMAL LOW (ref >90)
Glucose, Bld: 103 mg/dL — ABNORMAL HIGH (ref 70–99)
Potassium: 3.6 mEq/L — ABNORMAL LOW (ref 3.7–5.3)

## 2013-07-07 IMAGING — DX DG KNEE 1-2V PORT*R*
2 series · 2 of 2 positions shown · non-contrast
Comparison: Right knee radiographs - 07/05/2013; intraoperative
radiographic images of the right knee - 07/06/2013

CLINICAL DATA: External fixation

EXAM:
PORTABLE RIGHT KNEE - 1-2 VIEW

[ap]
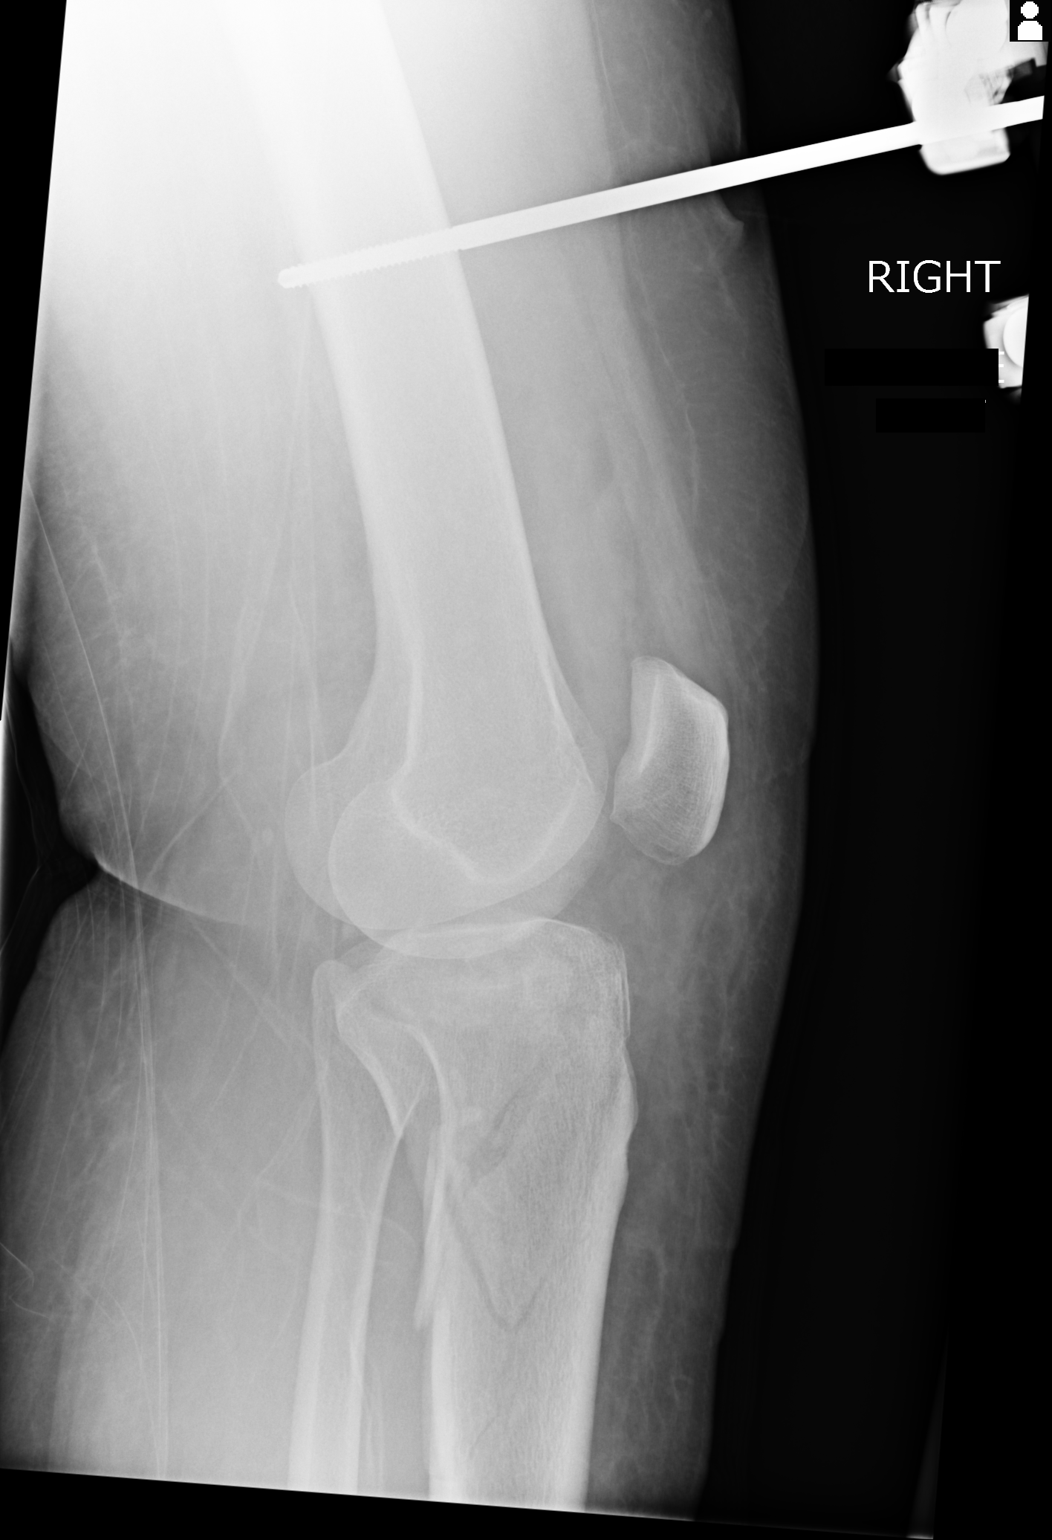

[lat]
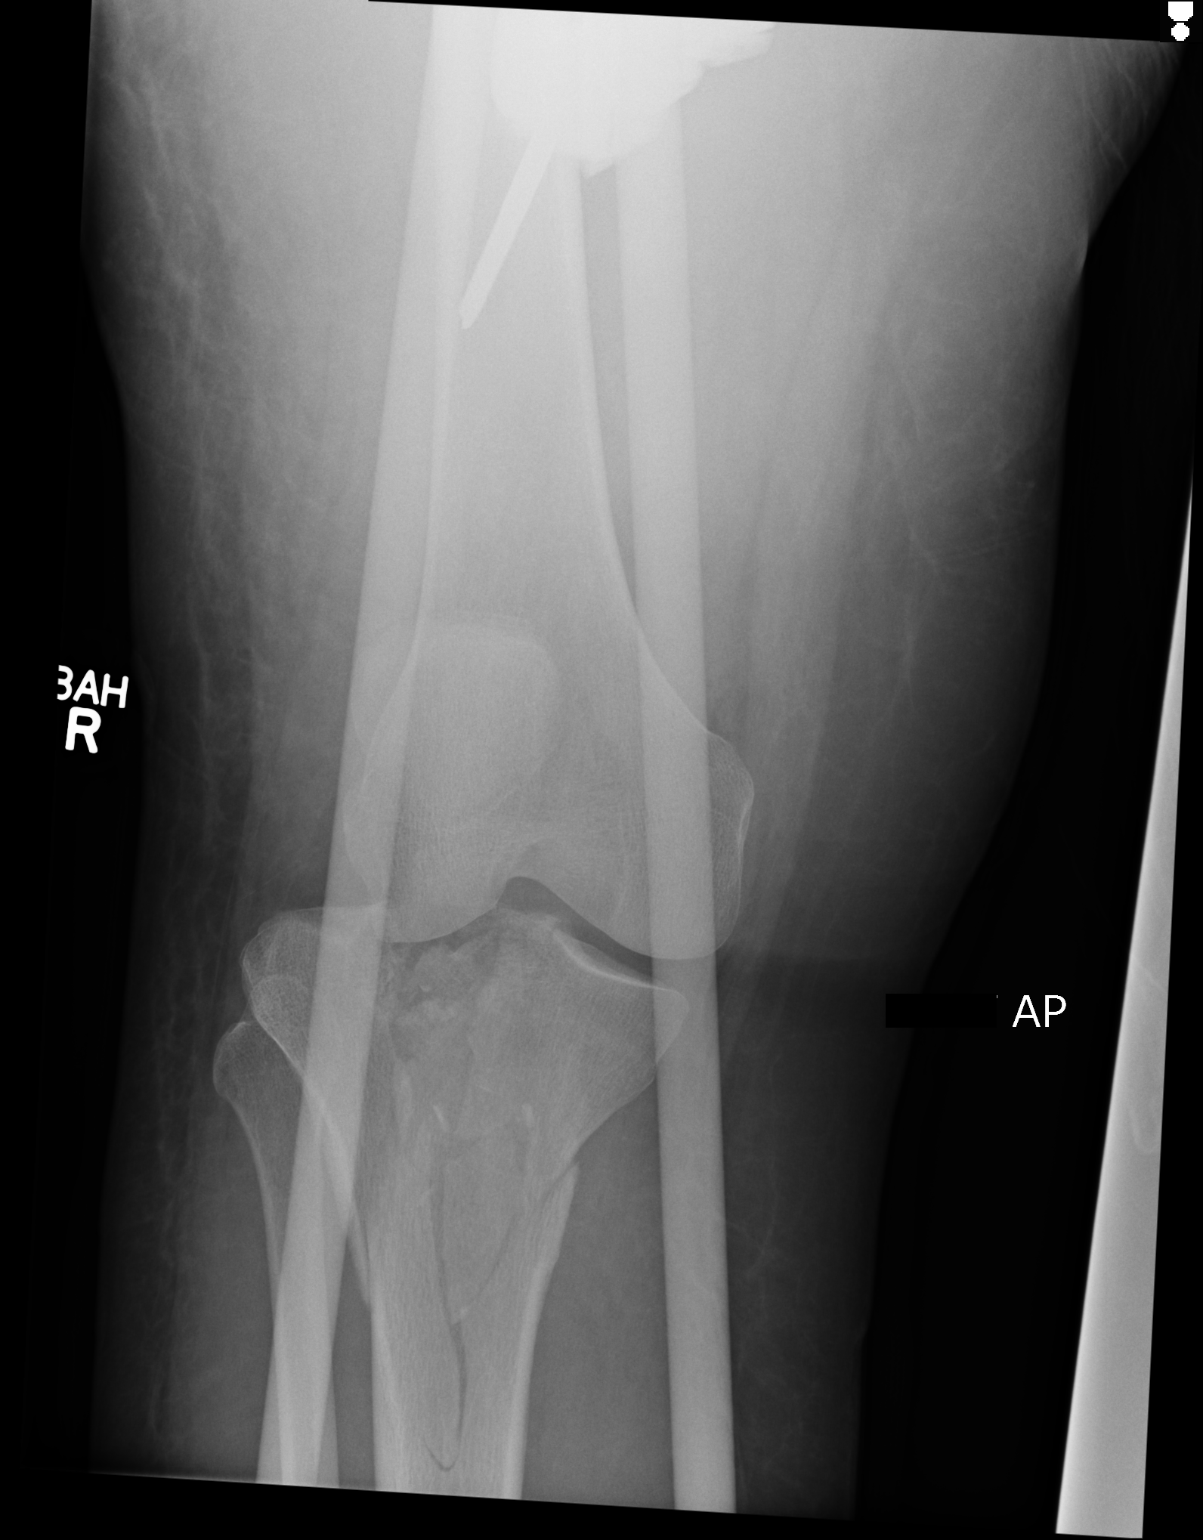

[2 of 2 positions shown; findings below may reference images not displayed]

FINDINGS: Images re demonstrate an external fixation rod within in the
mid/this aspect of the diaphysis of the femur. The known additional
external fixation rod within the proximal/mid tibial diaphysis is
not imaged appear

No change to minimally improved alignment of known comminuted tibial
plateau fracture. Expected adjacent soft tissue swelling. Persistent
findings of a lipohemarthrosis. No radiopaque foreign body.
IMPRESSION: No change to minimally improved alignment of the known comminuted
tibial plateau fracture.

## 2013-07-07 IMAGING — RF DG C-ARM 61-120 MIN
1 series · 5 of 5 positions shown · non-contrast
Comparison: None.

CLINICAL DATA: Revision of external fixation.

EXAM:
RIGHT TIBIA AND FIBULA - 2 VIEW; DG C-ARM 1-60 MIN

[Series 1: run · 5 of 5 slices shown]
[im 1/5]
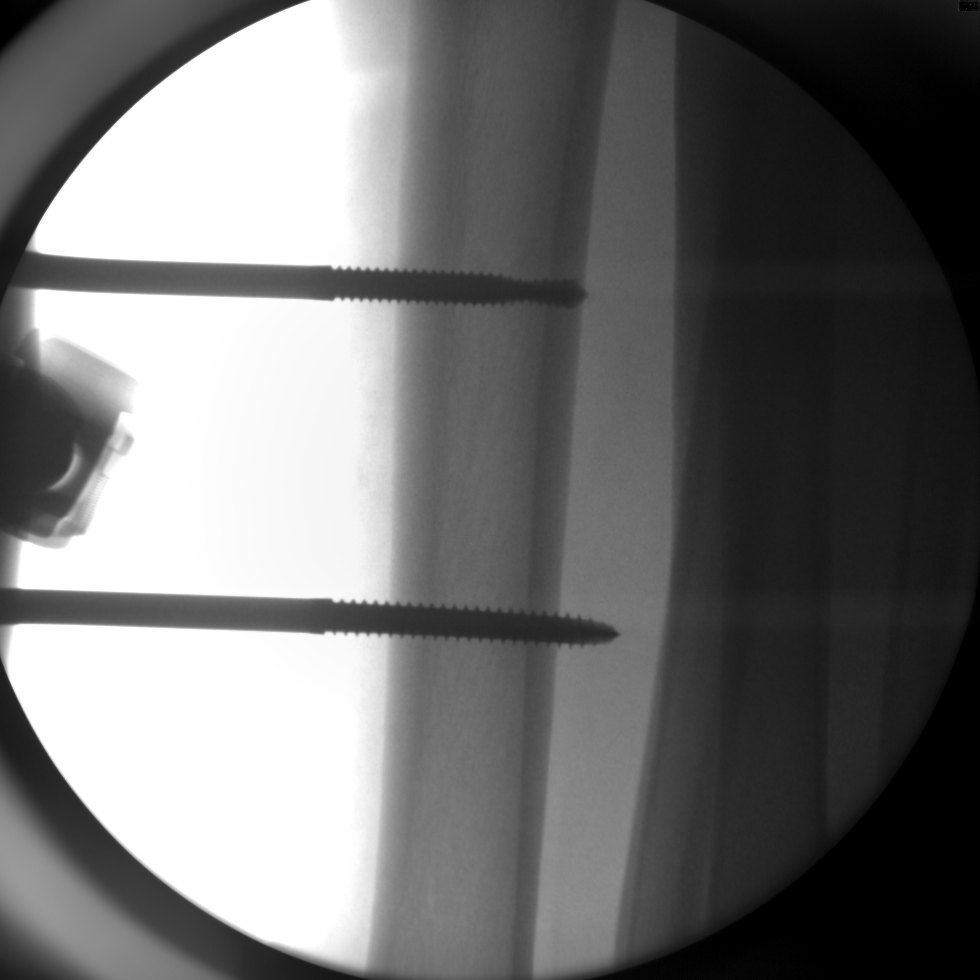
[im 2/5]
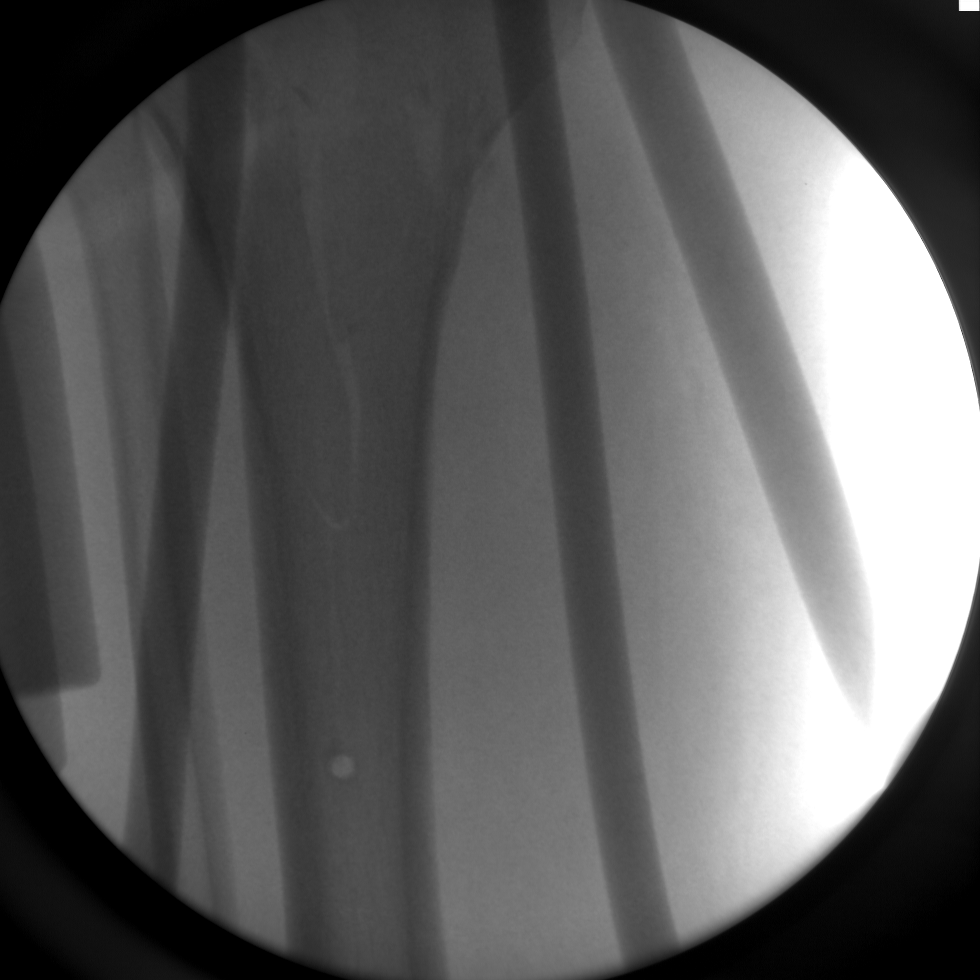
[im 3/5]
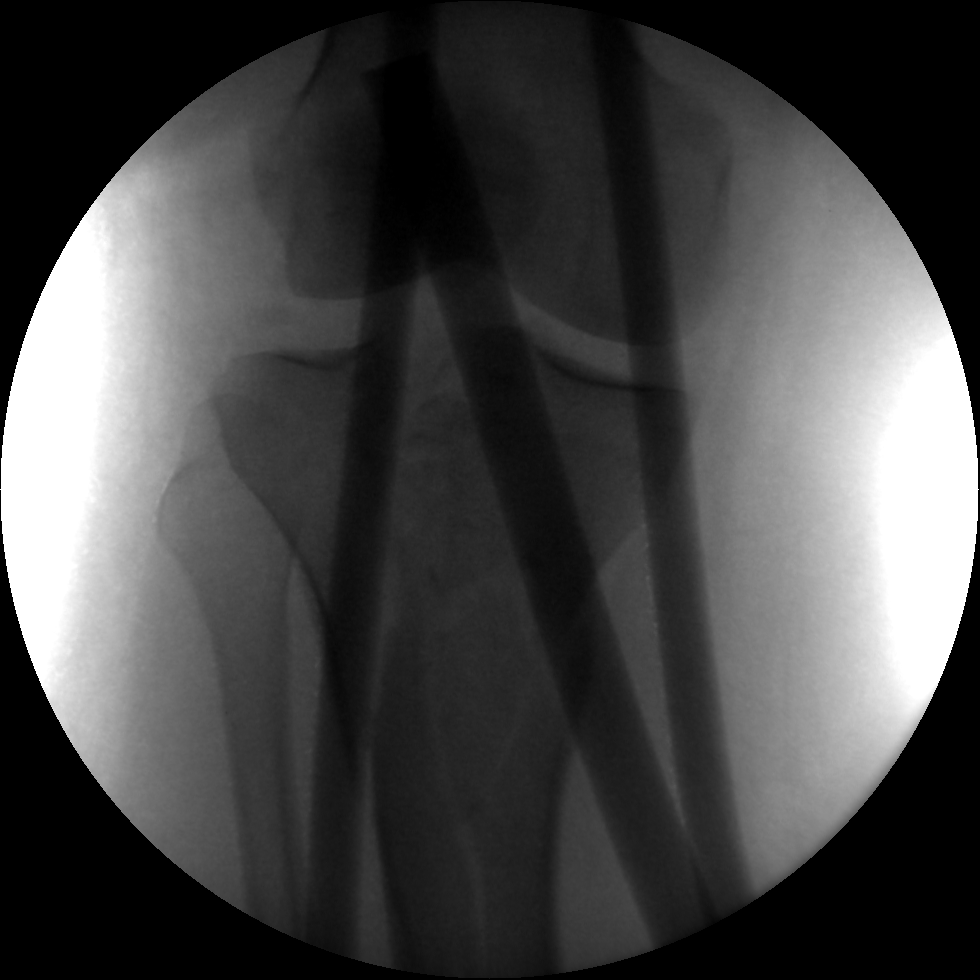
[im 4/5]
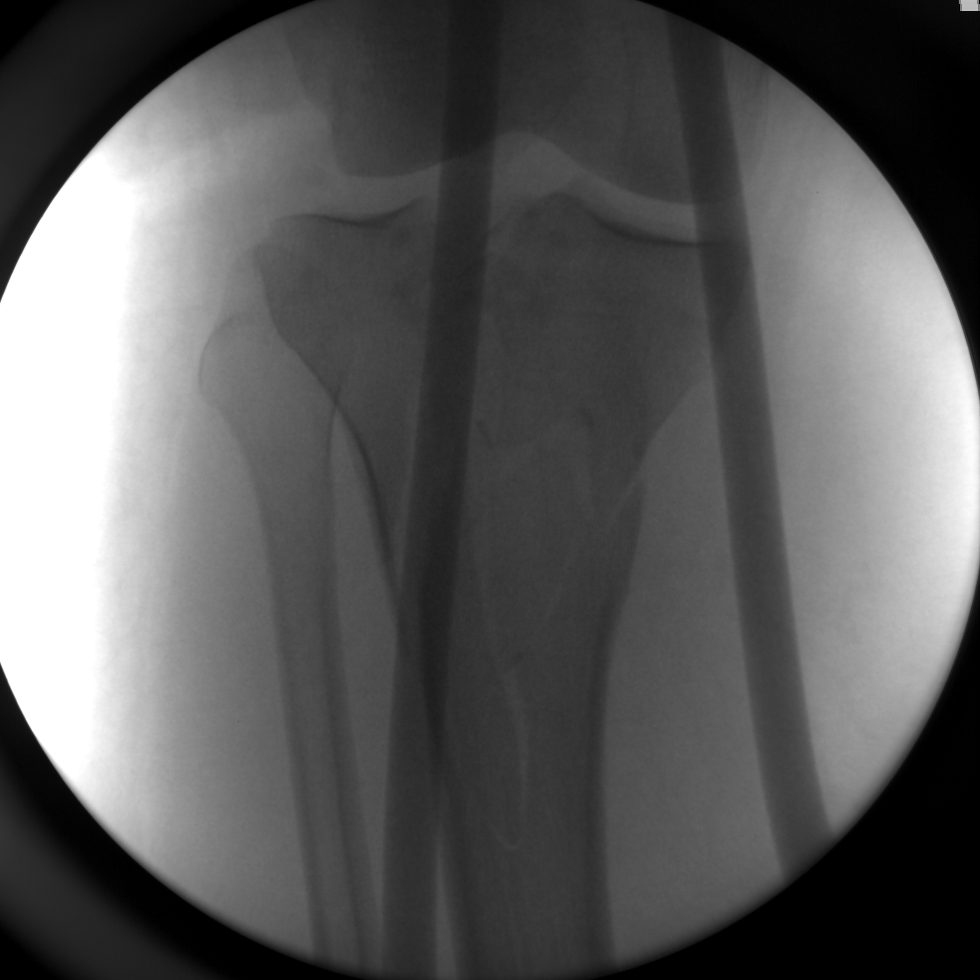
[im 5/5]
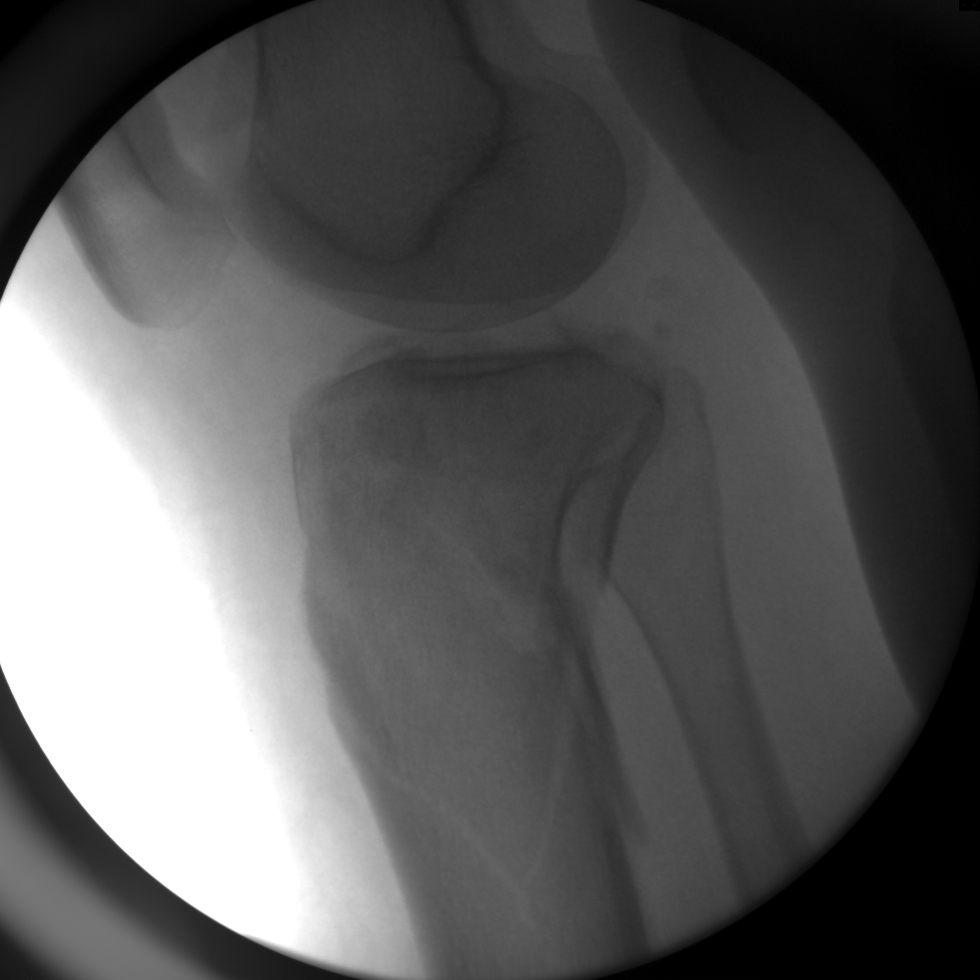

[5 of 5 positions shown; findings below may reference images not displayed]

FINDINGS: C-arm films document placement of external fixator device. The
initial scans appear to be in the tibia but now have been removed.
IMPRESSION: As above.

## 2013-07-07 SURGERY — EXTERNAL FIXATION, LOWER EXTREMITY
Anesthesia: General | Laterality: Right

## 2013-07-07 MED ORDER — PHENYLEPHRINE HCL 10 MG/ML IJ SOLN
INTRAMUSCULAR | Status: DC | PRN
  Administered 2013-07-07: 80 ug via INTRAVENOUS
  Administered 2013-07-07: 40 ug via INTRAVENOUS

## 2013-07-07 MED ORDER — LACTATED RINGERS IV SOLN
INTRAVENOUS | Status: DC
  Administered 2013-07-07: 08:00:00 via INTRAVENOUS

## 2013-07-07 MED ORDER — OXYCODONE HCL 5 MG/5ML PO SOLN: 5.0000 mg | Freq: Once | ORAL | Status: DC | PRN

## 2013-07-07 MED ORDER — OXYCODONE HCL 5 MG PO TABS: 5.0000 mg | ORAL_TABLET | Freq: Once | ORAL | Status: DC | PRN

## 2013-07-07 MED ORDER — PROMETHAZINE HCL 25 MG/ML IJ SOLN: 6.2500 mg | INTRAMUSCULAR | Status: DC | PRN

## 2013-07-07 MED ORDER — ENOXAPARIN SODIUM 40 MG/0.4ML ~~LOC~~ SOLN
40.0000 mg | SUBCUTANEOUS | Status: DC
  Administered 2013-07-07 – 2013-07-10 (×4): 40 mg via SUBCUTANEOUS
  Filled 2013-07-07 (×5): qty 0.4

## 2013-07-07 MED ORDER — NEOSTIGMINE METHYLSULFATE 1 MG/ML IJ SOLN
INTRAMUSCULAR | Status: DC | PRN
  Administered 2013-07-07: 4 mg via INTRAVENOUS

## 2013-07-07 MED ORDER — METOCLOPRAMIDE HCL 5 MG/ML IJ SOLN: 5.0000 mg | Freq: Three times a day (TID) | INTRAMUSCULAR | Status: DC | PRN

## 2013-07-07 MED ORDER — FENTANYL CITRATE 0.05 MG/ML IJ SOLN
INTRAMUSCULAR | Status: DC | PRN
  Administered 2013-07-07: 50 ug via INTRAVENOUS
  Administered 2013-07-07: 100 ug via INTRAVENOUS

## 2013-07-07 MED ORDER — HYDROMORPHONE HCL PF 1 MG/ML IJ SOLN
INTRAMUSCULAR | Status: AC
  Filled 2013-07-07: qty 1

## 2013-07-07 MED ORDER — HYDROMORPHONE HCL PF 1 MG/ML IJ SOLN
0.5000 mg | INTRAMUSCULAR | Status: DC | PRN
Start: 2013-07-07 — End: 2013-07-10
  Administered 2013-07-07 – 2013-07-08 (×3): 1 mg via INTRAVENOUS
  Filled 2013-07-07 (×3): qty 1

## 2013-07-07 MED ORDER — LIDOCAINE HCL (CARDIAC) 20 MG/ML IV SOLN
INTRAVENOUS | Status: DC | PRN
  Administered 2013-07-07: 30 mg via INTRAVENOUS

## 2013-07-07 MED ORDER — PROPOFOL 10 MG/ML IV BOLUS
INTRAVENOUS | Status: DC | PRN
  Administered 2013-07-07: 150 mg via INTRAVENOUS

## 2013-07-07 MED ORDER — HYDROMORPHONE HCL PF 1 MG/ML IJ SOLN
0.2500 mg | INTRAMUSCULAR | Status: DC | PRN
  Administered 2013-07-07 (×4): 0.5 mg via INTRAVENOUS

## 2013-07-07 MED ORDER — ARTIFICIAL TEARS OP OINT
TOPICAL_OINTMENT | OPHTHALMIC | Status: DC | PRN
  Administered 2013-07-07: 1 via OPHTHALMIC

## 2013-07-07 MED ORDER — ONDANSETRON HCL 4 MG/2ML IJ SOLN: 4.0000 mg | Freq: Four times a day (QID) | INTRAMUSCULAR | Status: DC | PRN

## 2013-07-07 MED ORDER — ONDANSETRON HCL 4 MG PO TABS: 4.0000 mg | ORAL_TABLET | Freq: Four times a day (QID) | ORAL | Status: DC | PRN

## 2013-07-07 MED ORDER — 0.9 % SODIUM CHLORIDE (POUR BTL) OPTIME
TOPICAL | Status: DC | PRN
  Administered 2013-07-07: 1000 mL

## 2013-07-07 MED ORDER — MIDAZOLAM HCL 5 MG/5ML IJ SOLN
INTRAMUSCULAR | Status: DC | PRN
  Administered 2013-07-07: 1 mg via INTRAVENOUS

## 2013-07-07 MED ORDER — LACTATED RINGERS IV SOLN
INTRAVENOUS | Status: DC | PRN
  Administered 2013-07-07 (×2): via INTRAVENOUS

## 2013-07-07 MED ORDER — POTASSIUM CHLORIDE IN NACL 20-0.9 MEQ/L-% IV SOLN
INTRAVENOUS | Status: DC
  Administered 2013-07-07 – 2013-07-08 (×2): via INTRAVENOUS
  Filled 2013-07-07 (×4): qty 1000

## 2013-07-07 MED ORDER — GLYCOPYRROLATE 0.2 MG/ML IJ SOLN
INTRAMUSCULAR | Status: DC | PRN
  Administered 2013-07-07: 0.6 mg via INTRAVENOUS

## 2013-07-07 MED ORDER — METOCLOPRAMIDE HCL 5 MG PO TABS: 5.0000 mg | ORAL_TABLET | Freq: Three times a day (TID) | ORAL | Status: DC | PRN

## 2013-07-07 MED ORDER — ONDANSETRON HCL 4 MG/2ML IJ SOLN
INTRAMUSCULAR | Status: DC | PRN
  Administered 2013-07-07: 4 mg via INTRAVENOUS

## 2013-07-07 MED ORDER — CEFAZOLIN SODIUM-DEXTROSE 2-3 GM-% IV SOLR
2.0000 g | Freq: Four times a day (QID) | INTRAVENOUS | Status: AC
  Administered 2013-07-07 – 2013-07-08 (×3): 2 g via INTRAVENOUS
  Filled 2013-07-07 (×3): qty 50

## 2013-07-07 MED ORDER — ROCURONIUM BROMIDE 100 MG/10ML IV SOLN
INTRAVENOUS | Status: DC | PRN
  Administered 2013-07-07: 5 mg via INTRAVENOUS
  Administered 2013-07-07: 50 mg via INTRAVENOUS

## 2013-07-07 SURGICAL SUPPLY — 75 items
BANDAGE ELASTIC 4 VELCRO ST LF (GAUZE/BANDAGES/DRESSINGS) ×2 IMPLANT
BANDAGE ELASTIC 6 VELCRO ST LF (GAUZE/BANDAGES/DRESSINGS) ×2 IMPLANT
BANDAGE ESMARK 6X9 LF (GAUZE/BANDAGES/DRESSINGS) ×1 IMPLANT
BANDAGE GAUZE ELAST BULKY 4 IN (GAUZE/BANDAGES/DRESSINGS) ×4 IMPLANT
BAR EXFX 200X11 NS LF (Trauma) ×1 IMPLANT
BAR EXFX 600X11 NS LF (MISCELLANEOUS) ×1
BAR GLASS FIBER EXFX 11X200 (Trauma) ×2 IMPLANT
BAR GLASS FIBER EXFX 11X500 (MISCELLANEOUS) ×2 IMPLANT
BAR GLASS FIBER EXFX 11X600 (MISCELLANEOUS) ×2 IMPLANT
BNDG CMPR 9X6 STRL LF SNTH (GAUZE/BANDAGES/DRESSINGS) ×1
BNDG COHESIVE 6X5 TAN STRL LF (GAUZE/BANDAGES/DRESSINGS) ×2 IMPLANT
BNDG ELASTIC 6X15 VLCR STRL LF (GAUZE/BANDAGES/DRESSINGS) ×2 IMPLANT
BNDG ESMARK 6X9 LF (GAUZE/BANDAGES/DRESSINGS) ×2
BNDG GAUZE ELAST 4 BULKY (GAUZE/BANDAGES/DRESSINGS) ×2 IMPLANT
BRUSH SCRUB DISP (MISCELLANEOUS) ×4 IMPLANT
CLAMP BLUE BAR TO BAR (MISCELLANEOUS) ×4 IMPLANT
CLAMP BLUE BAR TO PIN (MISCELLANEOUS) ×4 IMPLANT
CLEANER TIP ELECTROSURG 2X2 (MISCELLANEOUS) ×2 IMPLANT
CLOTH BEACON ORANGE TIMEOUT ST (SAFETY) ×2 IMPLANT
COVER SURGICAL LIGHT HANDLE (MISCELLANEOUS) ×4 IMPLANT
CUFF TOURNIQUET SINGLE 18IN (TOURNIQUET CUFF) IMPLANT
CUFF TOURNIQUET SINGLE 24IN (TOURNIQUET CUFF) IMPLANT
CUFF TOURNIQUET SINGLE 34IN LL (TOURNIQUET CUFF) IMPLANT
DRAPE C-ARM 42X72 X-RAY (DRAPES) IMPLANT
DRAPE C-ARMOR (DRAPES) ×2 IMPLANT
DRAPE U-SHAPE 47X51 STRL (DRAPES) ×2 IMPLANT
DRSG ADAPTIC 3X8 NADH LF (GAUZE/BANDAGES/DRESSINGS) ×2 IMPLANT
DRSG MEPITEL 3X4 ME34 (GAUZE/BANDAGES/DRESSINGS) ×2 IMPLANT
ELECT REM PT RETURN 9FT ADLT (ELECTROSURGICAL) ×2
ELECTRODE REM PT RTRN 9FT ADLT (ELECTROSURGICAL) ×1 IMPLANT
EVACUATOR 1/8 PVC DRAIN (DRAIN) IMPLANT
GAUZE XEROFORM 5X9 LF (GAUZE/BANDAGES/DRESSINGS) ×2 IMPLANT
GLOVE BIO SURGEON STRL SZ7.5 (GLOVE) ×2 IMPLANT
GLOVE BIO SURGEON STRL SZ8 (GLOVE) ×2 IMPLANT
GLOVE BIOGEL PI IND STRL 6.5 (GLOVE) ×3 IMPLANT
GLOVE BIOGEL PI IND STRL 7.5 (GLOVE) ×2 IMPLANT
GLOVE BIOGEL PI IND STRL 8 (GLOVE) ×1 IMPLANT
GLOVE BIOGEL PI INDICATOR 6.5 (GLOVE) ×3
GLOVE BIOGEL PI INDICATOR 7.5 (GLOVE) ×2
GLOVE BIOGEL PI INDICATOR 8 (GLOVE) ×1
GOWN PREVENTION PLUS XLARGE (GOWN DISPOSABLE) ×2 IMPLANT
GOWN STRL NON-REIN LRG LVL3 (GOWN DISPOSABLE) ×4 IMPLANT
GOWN STRL REIN XL XLG (GOWN DISPOSABLE) ×4 IMPLANT
HANDPIECE INTERPULSE COAX TIP (DISPOSABLE)
KIT BASIN OR (CUSTOM PROCEDURE TRAY) ×2 IMPLANT
KIT ROOM TURNOVER OR (KITS) ×2 IMPLANT
MANIFOLD NEPTUNE II (INSTRUMENTS) ×2 IMPLANT
NEEDLE 22X1 1/2 (OR ONLY) (NEEDLE) IMPLANT
NS IRRIG 1000ML POUR BTL (IV SOLUTION) ×2 IMPLANT
PACK ORTHO EXTREMITY (CUSTOM PROCEDURE TRAY) ×2 IMPLANT
PAD ARMBOARD 7.5X6 YLW CONV (MISCELLANEOUS) ×4 IMPLANT
PADDING CAST COTTON 6X4 STRL (CAST SUPPLIES) ×6 IMPLANT
PIN CLAMP 2BAR 75MM BLUE (PIN) ×2 IMPLANT
PIN HALF YELLOW 5X160X35 (PIN) ×2 IMPLANT
SET HNDPC FAN SPRY TIP SCT (DISPOSABLE) IMPLANT
SPONGE GAUZE 4X4 12PLY (GAUZE/BANDAGES/DRESSINGS) ×2 IMPLANT
SPONGE GAUZE 4X4 12PLY STER LF (GAUZE/BANDAGES/DRESSINGS) ×2 IMPLANT
SPONGE LAP 18X18 X RAY DECT (DISPOSABLE) ×2 IMPLANT
SPONGE SCRUB IODOPHOR (GAUZE/BANDAGES/DRESSINGS) ×2 IMPLANT
STAPLER VISISTAT 35W (STAPLE) IMPLANT
STOCKINETTE IMPERVIOUS LG (DRAPES) ×2 IMPLANT
STRIP CLOSURE SKIN 1/2X4 (GAUZE/BANDAGES/DRESSINGS) IMPLANT
SUCTION FRAZIER TIP 10 FR DISP (SUCTIONS) IMPLANT
SUT ETHILON 3 0 PS 1 (SUTURE) IMPLANT
SUT VIC AB 0 CT1 27 (SUTURE) ×4
SUT VIC AB 0 CT1 27XBRD ANBCTR (SUTURE) ×2 IMPLANT
SUT VIC AB 2-0 CT1 27 (SUTURE) ×4
SUT VIC AB 2-0 CT1 TAPERPNT 27 (SUTURE) ×2 IMPLANT
SYR CONTROL 10ML LL (SYRINGE) IMPLANT
TOWEL OR 17X24 6PK STRL BLUE (TOWEL DISPOSABLE) ×4 IMPLANT
TOWEL OR 17X26 10 PK STRL BLUE (TOWEL DISPOSABLE) ×4 IMPLANT
TUBE CONNECTING 12X1/4 (SUCTIONS) ×2 IMPLANT
UNDERPAD 30X30 INCONTINENT (UNDERPADS AND DIAPERS) ×2 IMPLANT
WATER STERILE IRR 1000ML POUR (IV SOLUTION) ×4 IMPLANT
YANKAUER SUCT BULB TIP NO VENT (SUCTIONS) ×2 IMPLANT
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
## 2013-07-07 NOTE — Progress Notes (Signed)
LATE ENTRY  Clinical Social Work Department CLINICAL SOCIAL WORK PLACEMENT NOTE 07/07/2013  Patient:  Carol Vargas, Carol Vargas  Account Number:  [id number] Admit date:  07/05/2013  Clinical Social Worker:  Maryclare Labrador, Theresia Majors  Date/time:  07/06/2013 12:00 N  Clinical Social Work is seeking post-discharge placement for this patient at the following level of care:   SKILLED NURSING   (*CSW will update this form in Epic as items are completed)   N/A-expanded search due to pt's status as self-pay  Patient/family provided with [facility] Department of Clinical Social Work's list of facilities offering this level of care within the geographic area requested by the patient (or if unable, by the patient's family).  07/06/13  Patient/family informed of their freedom to choose among providers that offer the needed level of care, that participate in Medicare, Medicaid or managed care program needed by the patient, have an available bed and are willing to accept the patient.  07/06/13 Patient/family informed of [facility]' ownership interest in [facility], as well as of the fact that they are under no obligation to receive care at this facility.  PASARR submitted to [employer] on 07/07/2013 PASARR number received from [employer] on 07/07/2013  FL2 transmitted to all facilities in geographic area requested by pt/family on  07/07/2013 FL2 transmitted to all facilities within larger geographic area on 07/07/2013  Patient informed that his/her managed care company has contracts with or will negotiate with  certain facilities, including the following:     Patient/family informed of bed offers received:   Patient chooses bed at  Physician recommends and patient chooses bed at    Patient to be transferred to  on   Patient to be transferred to facility by   The following physician request were entered in Epic:   Additional Comments:   Maryclare Labrador, MSW, [facility] Clinical Social  Worker [phone]

## 2013-07-07 NOTE — Progress Notes (Signed)
I have seen and examined the patient. I agree with the findings above.  I discussed with the patient the risks and benefits of surgery, including the possibility of infection, nerve injury, vessel injury, wound breakdown, arthritis, DVT/ PE, loss of motion, and need for further surgery among others.  We also specifically discussed the need to stage surgery because of the elevated risk of soft tissue breakdown that could lead to amputation.  She understood these risks and wished to proceed.   Budd Palmer, MD 07/07/2013 8:00 AM

## 2013-07-07 NOTE — Progress Notes (Signed)
Patient returned from O.R. S/P external fixator revision. ACE wrap dressing intact. Ice pack to site. Elevated on pillow. Call bell in reach.

## 2013-07-07 NOTE — Anesthesia Preprocedure Evaluation (Addendum)
Anesthesia Evaluation  Patient identified by MRN, date of birth, ID band Patient awake    Reviewed: Allergy & Precautions, H&P , NPO status , Patient's Chart, lab work & pertinent test results  Airway Mallampati: II TM Distance: >3 FB Neck ROM: Full    Dental  (+) Teeth Intact and Dental Advisory Given   Pulmonary Current Smoker,  breath sounds clear to auscultation        Cardiovascular hypertension, Pt. on medications Rhythm:Regular Rate:Normal     Neuro/Psych    GI/Hepatic   Endo/Other  Morbid obesity  Renal/GU      Musculoskeletal   Abdominal (+) + obese,   Peds  Hematology   Anesthesia Other Findings   Reproductive/Obstetrics                          Anesthesia Physical Anesthesia Plan  ASA: II  Anesthesia Plan: General   Post-op Pain Management:    Induction: Intravenous  Airway Management Planned: Oral ETT  Additional Equipment:   Intra-op Plan:   Post-operative Plan: Extubation in OR  Informed Consent: I have reviewed the patients History and Physical, chart, labs and discussed the procedure including the risks, benefits and alternatives for the proposed anesthesia with the patient or authorized representative who has indicated his/her understanding and acceptance.   Dental advisory given  Plan Discussed with: Anesthesiologist, Surgeon and CRNA  Anesthesia Plan Comments:        Anesthesia Quick Evaluation
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
## 2013-07-07 NOTE — Transfer of Care (Signed)
Immediate Anesthesia Transfer of Care Note  Patient: Carol Vargas  Procedure(s) Performed: Procedure(s): Ex-Fix Revision Right Tibial Plateau (Right)  Patient Location: PACU  Anesthesia Type:General  Level of Consciousness: awake, alert  and oriented  Airway & Oxygen Therapy: Patient Spontanous Breathing and Patient connected to nasal cannula oxygen  Post-op Assessment: Report given to PACU RN, Post -op Vital signs reviewed and stable and Patient moving all extremities X 4  Post vital signs: Reviewed and stable  Complications: No apparent anesthesia complications

## 2013-07-07 NOTE — Brief Op Note (Signed)
07/05/2013 - 07/07/2013  10:15 AM  PATIENT:  Carol Vargas  37 y.o. female  PRE-OPERATIVE DIAGNOSIS:  Right Tibial bicondylar Plateau Fx  POST-OPERATIVE DIAGNOSIS:  Right Tibial bicondylar Plateau Fx  PROCEDURE:  Procedure(s): 1. Ex-Fix Revision Right Tibial Plateau (Right) and 2. revision closed reduction of bicondylar plateau  SURGEON:  Surgeon(s) and Role:    * Budd Palmer, MD - Primary  PHYSICIAN ASSISTANT: Montez Morita, PA-C  ANESTHESIA:   general  I/O:  Total I/O In: 1000 [I.V.:1000] Out: -   SPECIMEN:  No Specimen  DISPOSITION OF SPECIMEN:  To micro  TOURNIQUET:  * No tourniquets in log *  DICTATION: Reubin Milan Dictation [id number]

## 2013-07-07 NOTE — Anesthesia Postprocedure Evaluation (Signed)
  Anesthesia Post-op Note  Patient: Carol Vargas  Procedure(s) Performed: Procedure(s): Ex-Fix Revision Right Tibial Plateau (Right)  Patient Location: PACU  Anesthesia Type:General  Level of Consciousness: awake, alert  and oriented  Airway and Oxygen Therapy: Patient Spontanous Breathing  Post-op Pain: mild  Post-op Assessment: Post-op Vital signs reviewed, Patient's Cardiovascular Status Stable, Respiratory Function Stable and Patent Airway  Post-op Vital Signs: stable  Complications: No apparent anesthesia complications

## 2013-07-07 NOTE — Preoperative (Signed)
Beta Blockers   Reason not to administer Beta Blockers:Not Applicable 

## 2013-07-07 NOTE — Anesthesia Procedure Notes (Signed)
Procedure Name: Intubation Date/Time: 07/07/2013 8:29 AM Performed by: Gayla Medicus Pre-anesthesia Checklist: Patient identified, Patient being monitored, Emergency Drugs available, Timeout performed and Suction available Patient Re-evaluated:Patient Re-evaluated prior to inductionOxygen Delivery Method: Circle system utilized Preoxygenation: Pre-oxygenation with 100% oxygen Intubation Type: IV induction Ventilation: Mask ventilation without difficulty and Oral airway inserted - appropriate to patient size Laryngoscope Size: Mac and 3 Grade View: Grade I Tube type: Oral Tube size: 7.5 mm Number of attempts: 1 Airway Equipment and Method: Stylet Placement Confirmation: ETT inserted through vocal cords under direct vision,  positive ETCO2 and breath sounds checked- equal and bilateral Secured at: 21 cm Tube secured with: Tape Dental Injury: Teeth and Oropharynx as per pre-operative assessment

## 2013-07-07 NOTE — Progress Notes (Signed)
PT Cancellation Note  Patient Details Name: Carol Vargas MRN: [id number] DOB: 09-16-1975   Cancelled Treatment:    Reason Eval/Treat Not Completed: Patient at procedure or test/unavailable . Patient in surgery at this time for external fixation. Will follow up in AM   Robinette, [name] 07/07/2013, 8:59 AM

## 2013-07-08 ENCOUNTER — Encounter (HOSPITAL_COMMUNITY): Payer: Self-pay | Admitting: Orthopedic Surgery

## 2013-07-08 ENCOUNTER — Inpatient Hospital Stay (HOSPITAL_COMMUNITY): Payer: Medicaid Other

## 2013-07-08 LAB — BASIC METABOLIC PANEL
CO2: 28 mEq/L (ref 19–32)
Calcium: 8.2 mg/dL — ABNORMAL LOW (ref 8.4–10.5)
Chloride: 104 mEq/L (ref 96–112)
Creatinine, Ser: 0.72 mg/dL (ref 0.50–1.10)
GFR calc Af Amer: >90 mL/min (ref >90)
Potassium: 3.8 mEq/L (ref 3.7–5.3)
Sodium: 142 mEq/L (ref 137–147)

## 2013-07-08 LAB — CBC
MCH: 28.7 pg (ref 26.0–34.0)
MCHC: 33 g/dL (ref 30.0–36.0)
MCV: 87 fL (ref 78.0–100.0)
Platelets: 211 10*3/uL (ref 150–400)
RBC: 3.24 MIL/uL — ABNORMAL LOW (ref 3.87–5.11)
RDW: 13.9 % (ref 11.5–15.5)
WBC: 8.9 10*3/uL (ref 4.0–10.5)

## 2013-07-08 IMAGING — CT CT KNEE*R* W/O CM
3 of 5 series · 7 of 33 positions shown, 8 images · non-contrast
Comparison: None.

CLINICAL DATA: Tibial plateau fracture

EXAM:
CT OF THE RIGHT KNEE WITHOUT CONTRAST
TECHNIQUE: Multidetector CT imaging was performed according to the standard
protocol. Multiplanar CT image reconstructions were also generated.

[Series 5: o-mar, soft tissue · axial · 0.33mm/px · z∈[-408,-358]mm · 2 of 79 slices shown]
[im 20/79  soft-tissue]
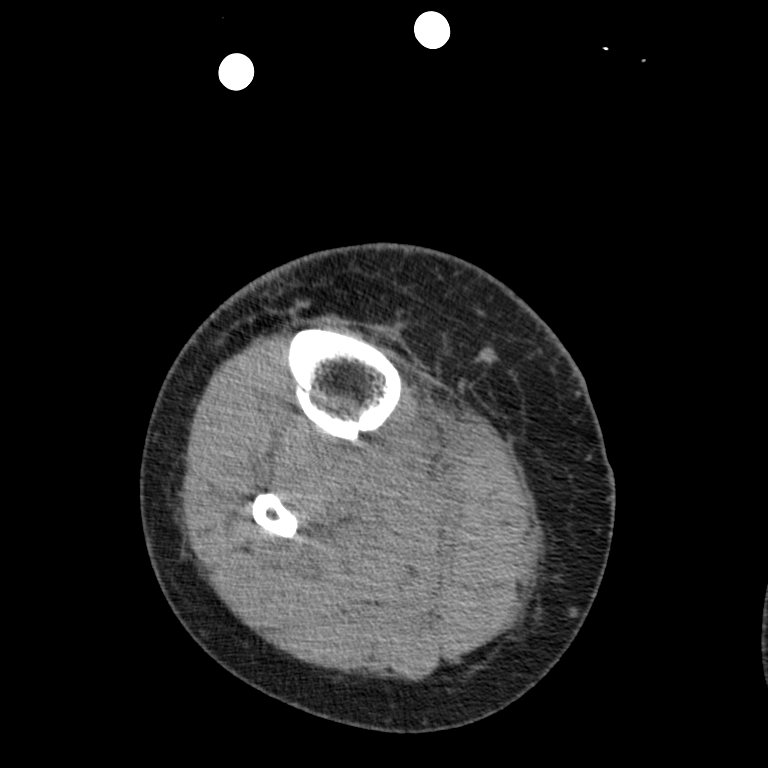
[im 40/79  soft-tissue]
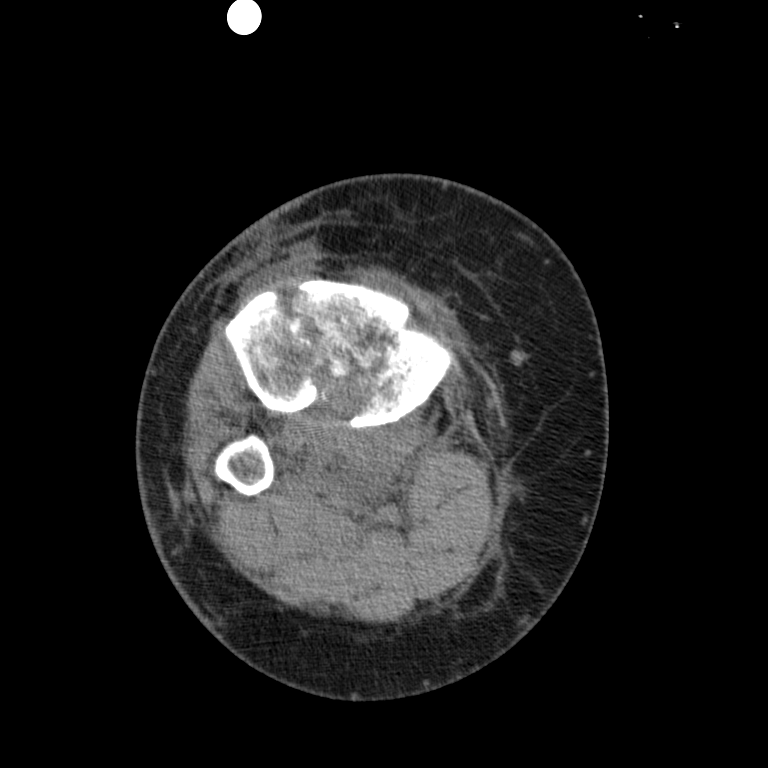

[Series 8078: o-mar, cor · coronal · 0.33mm/px · 3 of 56 slices shown]
[im 12/56  bone]
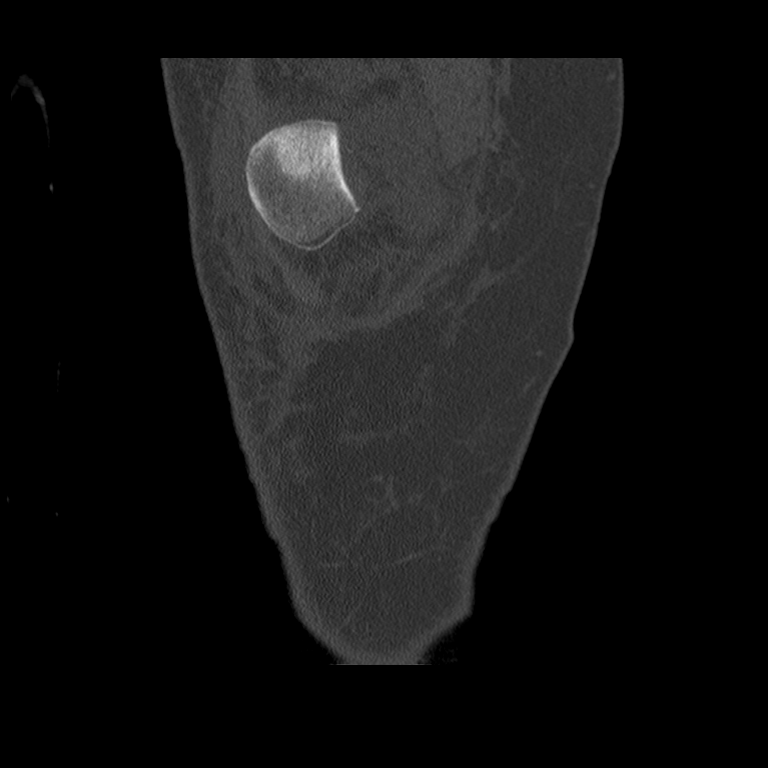
[im 23/56  bone]
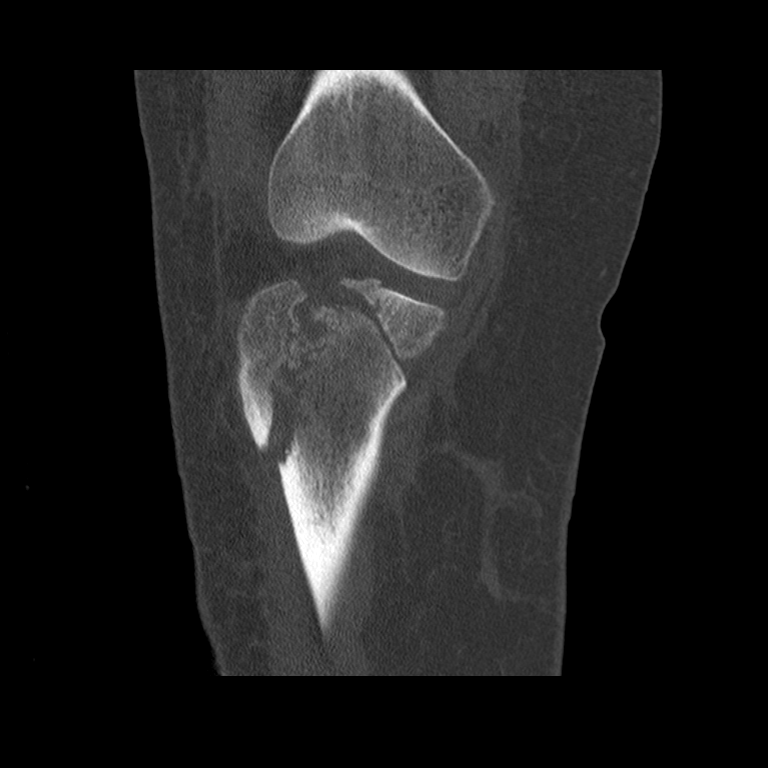
[im 34/56  bone]
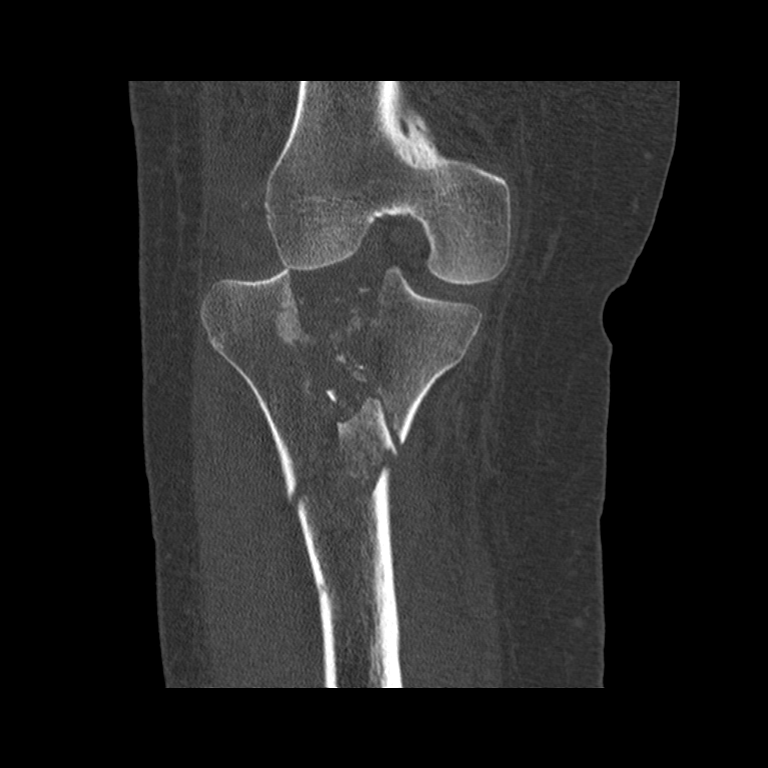

[o-mar, ang.axial · axial · 0.33mm/px · z∈[-405,-358]mm · 2 of 74 slices shown, 3 images]
[im 25/74  soft-tissue]
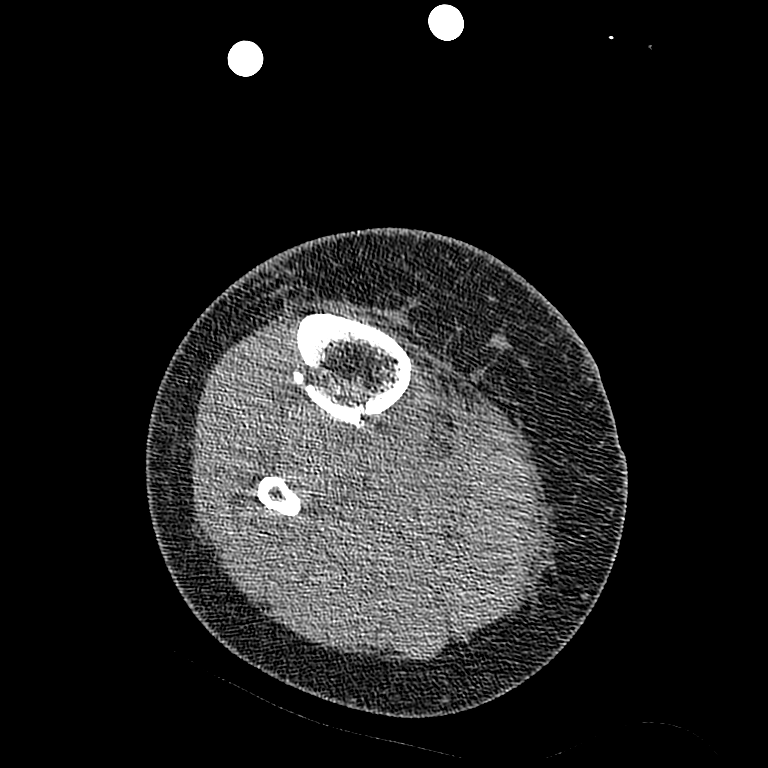
[im 25/74  bone]
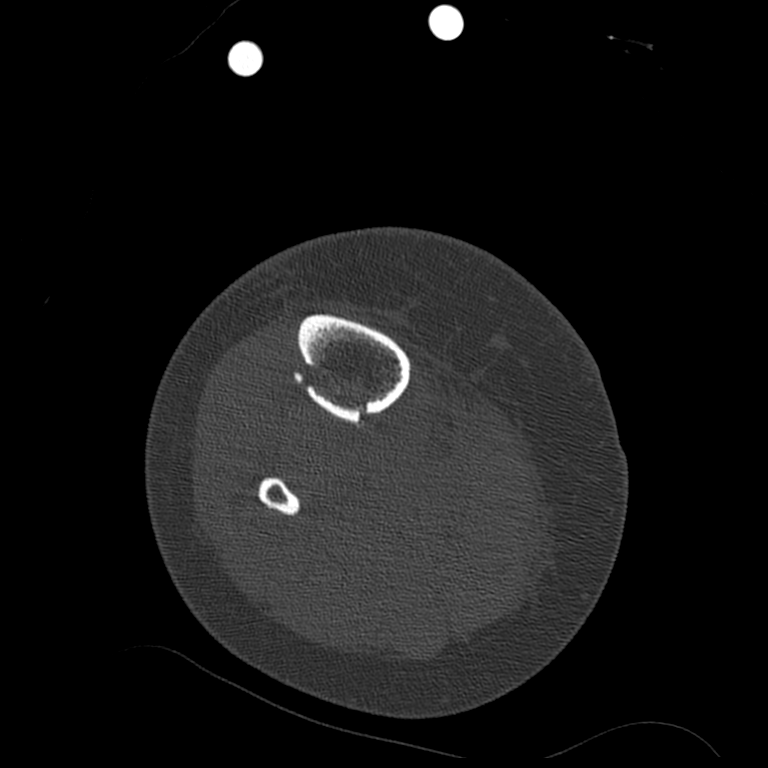
[im 49/74  bone]
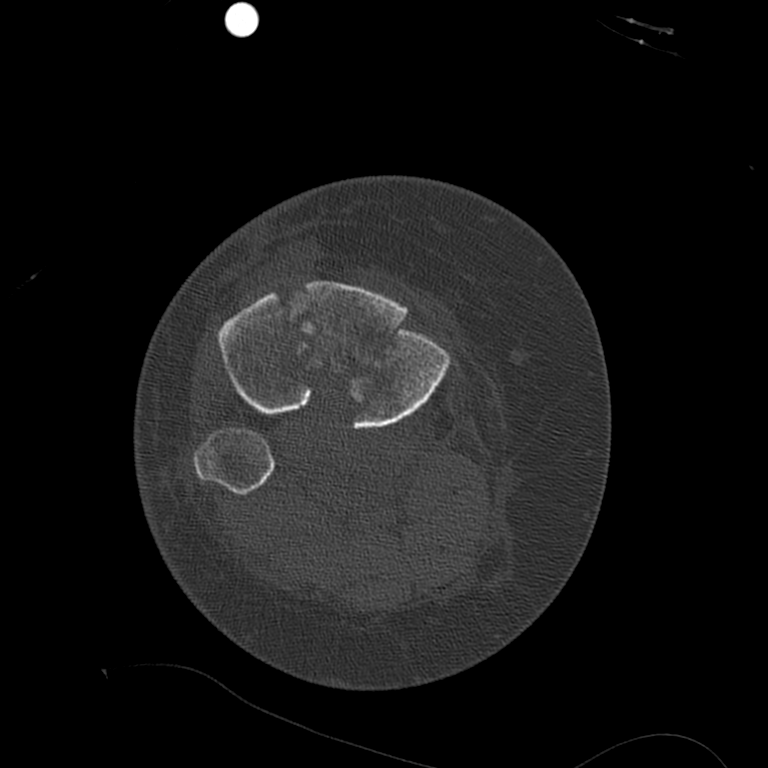

[7 of 33 positions shown; findings below may reference images not displayed]

FINDINGS: There is a severely comminuted fracture of the tibial plateau. The
fracture primarily involves the tibial spine. There is 2.7 cm of
lateral displacement of the lateral tibial plateau relative to the
femoral condyle. There is a fracture cleft which extends to the
lateral most aspect of the medial tibial plateau with 8 mm of
anterior displacement. The majority of the articular surface of the
medial and lateral tibial plateaus arm maintained. There are
multiple fracture clefts extending into the proximal tibial
metaphysis and diaphysis. The most severely comminuted fracture area
is at the site of the ACL insertion. There is a fracture cleft
extending to the tibial tubercle at the site of the patellar tendon
insertion. There are 3 osseous fragments along the posterior lateral
knee joint. There is a small 3 mm osseous fragment abutting the
lateral femoral condyle peripherally.

There is a tiny chip fracture from the peripheral corner of the
lateral femoral condyle.

The patellofemoral articulation is normal. There is no proximal
fibular fracture. There is a large joint effusion. There is a
lipomohemarthrosis. There is surrounding soft tissue edema. There is
no subcutaneous emphysema.
IMPRESSION: Severely comminuted fracture of the proximal right tibia primarily
involving the central portion in the region of the tibial spine with
2.7 cm of lateral displacement of the lateral tibial plateau a
relative to the femoral condyle. The fracture extends into the
proximal metaphysis and diaphysis with a fracture cleft extending to
the tibial tuberosity at the site of the patellar tendon insertion.
The ACL inserts at the site of severe comminution.

## 2013-07-08 MED ORDER — ACETAMINOPHEN 325 MG PO TABS
325.0000 mg | ORAL_TABLET | Freq: Four times a day (QID) | ORAL | Status: DC | PRN
  Administered 2013-07-08 (×2): 650 mg via ORAL
  Filled 2013-07-08 (×2): qty 2

## 2013-07-08 NOTE — Progress Notes (Signed)
Physical Therapy Treatment Patient Details Name: Carol Vargas MRN: [id number] DOB: [birth year] Today's Date: 07/08/2013 Time: 7829-5621 PT Time Calculation (min): 39 min  PT Assessment / Plan / Recommendation  History of Present Illness 37 y.o. s/p EXTERNAL FIXATION LEG (Right)   PT Comments   Pt nervous about attempting to step enter home and planning for transportation to home.  Pt ed on transfers in/out of car or use of transportation company.  Reviewed and then practiced single step for entering home with pt demoing good technique despite being nervous.  Also discussed using W/C and family members to A with bumping pt up step.  Will continue to follow.    Follow Up Recommendations  Home health PT;Supervision/Assistance - 24 hour     Does the patient have the potential to tolerate intense rehabilitation     Barriers to Discharge        Equipment Recommendations  Rolling walker with 5" wheels;3in1 (PT);Wheelchair (measurements PT);Wheelchair cushion (measurements PT)    Recommendations for Other Services    Frequency Min 3X/week   Progress towards PT Goals Progress towards PT goals: Progressing toward goals  Plan Current plan remains appropriate    Precautions / Restrictions Precautions Precautions: Fall Restrictions Weight Bearing Restrictions: Yes RLE Weight Bearing: Non weight bearing   Pertinent Vitals/Pain R LE "starting to hurt"  RN made aware.      Mobility  Bed Mobility Bed Mobility: Supine to Sit;Sitting - Scoot to Edge of Bed Supine to Sit: 5: Supervision Sitting - Scoot to Edge of Bed: 5: Supervision Details for Bed Mobility Assistance: pt demos good technique with sheet to move LE.   Transfers Transfers: Sit to Stand;Stand to Sit Sit to Stand: 5: Supervision;With upper extremity assist;From bed Stand to Sit: 5: Supervision;With upper extremity assist;To bed;To chair/3-in-1 Details for Transfer Assistance: cues for positioning of R LE.    Ambulation/Gait Ambulation/Gait Assistance: 4: Min guard Ambulation Distance (Feet): 5 Feet Assistive device: Rolling walker Ambulation/Gait Assistance Details: cues for use of RW, safety with R LE.   Gait Pattern: Step-to pattern Stairs: Yes Stairs Assistance: 4: Min assist Stairs Assistance Details (indicate cue type and reason): cues for safe technique with RW and encouragement.  pt's son-in-law present as 2nd person for pt safety and comfort.   Stair Management Technique: No rails;Backwards;With walker Number of Stairs: 1 Wheelchair Mobility Wheelchair Mobility: No    Exercises     PT Diagnosis:    PT Problem List:   PT Treatment Interventions:     PT Goals (current goals can now be found in the care plan section) Acute Rehab PT Goals Patient Stated Goal: not stated Time For Goal Achievement: 07/20/13 Potential to Achieve Goals: Good  Visit Information  Last PT Received On: 07/08/13 Assistance Needed: +1 History of Present Illness: 37 y.o. s/p EXTERNAL FIXATION LEG (Right)    Subjective Data  Patient Stated Goal: not stated   Cognition  Cognition Arousal/Alertness: Awake/alert Behavior During Therapy: WFL for tasks assessed/performed Overall Cognitive Status: Within Functional Limits for tasks assessed    Balance  Balance Balance Assessed: No  End of Session PT - End of Session Equipment Utilized During Treatment: Gait belt Activity Tolerance: Patient limited by fatigue;Patient limited by pain Patient left: with call bell/phone within reach;with family/visitor present (Sitting on 3-in-1 bathing with daughter.  ) Nurse Communication: Mobility status   GP     RitenourAlison Murray, [place] [phone] 07/08/2013, 2:17 PM
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
## 2013-07-08 NOTE — Progress Notes (Signed)
Occupational Therapy Treatment Patient Details Name: Carol Vargas MRN: [id number] DOB: 12-15-1975 Today's Date: 07/08/2013 Time: 4098-1191 OT Time Calculation (min): 28 min  OT Assessment / Plan / Recommendation  History of present illness 37 y.o. s/p EXTERNAL FIXATION LEG (Right)   OT comments  Pt moving well in session. Education provided to pt. New velcro placed on fixator for foot strap.   Follow Up Recommendations  Home health OT;Supervision/Assistance - 24 hour    Barriers to Discharge       Equipment Recommendations  3 in 1 bedside comode;Other (comment) (larger size 3 in 1)    Recommendations for Other Services    Frequency Min 2X/week   Progress towards OT Goals Progress towards OT goals: Progressing toward goals  Plan Discharge plan remains appropriate    Precautions / Restrictions Precautions Precautions: Fall Restrictions Weight Bearing Restrictions: Yes RLE Weight Bearing: Non weight bearing   Pertinent Vitals/Pain Pain 8/10. Repositioned. RLE elevated on pillow.     ADL  Grooming: Set up (hand sanitizer on hands) Where Assessed - Grooming: Supported sitting Lower Body Dressing: Minimal assistance Where Assessed - Lower Body Dressing: Supported sit to stand Toilet Transfer: Hydrographic surveyor Method: Sit to Barista: Raised toilet seat with arms (or 3-in-1 over toilet) Toileting - Clothing Manipulation and Hygiene: Minimal assistance Where Assessed - Engineer, mining and Hygiene: Sit to stand from 3-in-1 or toilet Equipment Used: Gait belt;Rolling walker;Reacher Transfers/Ambulation Related to ADLs: Min guard/Min A for ambulation-pt hopped to bathroom. Min guard for sit <> stand transfers. ADL Comments: Educated on use of reacher and pt practiced with this. Educated on dressing technique. Recommended sitting for bathing and dressing and to stand in front of chair/bed with walker in front when pulling up LB  clothing. Told pt she could take foot strap off to give foot a break if needed, otherwise have it on. New velcro placed on external fixator for foot strap. Explained that elevating and ice will help with edema of RLE.    OT Diagnosis:    OT Problem List:   OT Treatment Interventions:     OT Goals(current goals can now be found in the care plan section) Acute Rehab OT Goals Patient Stated Goal: not stated OT Goal Formulation: With patient Time For Goal Achievement: 07/13/13 Potential to Achieve Goals: Good ADL Goals Pt Will Perform Lower Body Bathing: with set-up;with supervision;sit to/from stand Pt Will Perform Lower Body Dressing: with set-up;with supervision;sit to/from stand Pt Will Transfer to Toilet: with modified independence;ambulating (3 in 1 over commode) Pt Will Perform Toileting - Clothing Manipulation and hygiene: with modified independence;sit to/from stand Additional ADL Goal #1: Pt will perform bed mobility at Mod I level as precursor for ADLs.  Additional ADL Goal #2: Pt will tolerate foot strap on RLE.   Visit Information  Last OT Received On: 07/08/13 Assistance Needed: +1 History of Present Illness: 37 y.o. s/p EXTERNAL FIXATION LEG (Right)    Subjective Data      Prior Functioning       Cognition  Cognition Arousal/Alertness: Awake/alert Behavior During Therapy: WFL for tasks assessed/performed Overall Cognitive Status: Within Functional Limits for tasks assessed    Mobility  Bed Mobility Bed Mobility: Supine to Sit;Sitting - Scoot to Edge of Bed Supine to Sit: 4: Min assist Sitting - Scoot to [place] of Bed: 4: Min guard Details for Bed Mobility Assistance: positioned sheet around RLE under ankle to help pt with moving RLE.  Transfers Transfers: Sit  to Stand;Stand to Sit Sit to Stand: 4: Min guard;With upper extremity assist;From bed;From chair/3-in-1 Stand to Sit: 4: Min guard;With upper extremity assist;To chair/3-in-1 Details for Transfer  Assistance: Cues for technique.    Exercises      Balance     End of Session OT - End of Session Equipment Utilized During Treatment: Gait belt;Rolling walker Activity Tolerance: Patient tolerated treatment well Patient left: in chair;with call bell/phone within reach  GO      Earlie Raveling OTR/L [phone] 07/08/2013, 12:45 PM

## 2013-07-08 NOTE — Progress Notes (Signed)
[facility] PROGRESS NOTE  Carol Vargas ZOX:[id number] DOB: 1976-01-28 DOA: 07/05/2013 PCP: No primary provider on file.  Assessment/Plan: Hypertension  -History of elevated blood pressure during pregnancy.  -BPs mostly controlled on current lisinopril- will continue to monitor and consider increasing to 10 if blood pressure staying high.   -Needs to followup with primary care physician for HTN.  Obesity  -Patient's weight is 279 pounds, her BMI is 42.  -Counseled about weight loss.  -Recommend to have a polysomnogram as outpatient.  -agree If her HTN is secondary to OSA this likely to be very difficult to control without OSA/CPAP control  Constipation  conitnue miralax, senokot and prn sorbitol per primary      Procedures: Ex-Fix Revision Right Tibial Plateau (Right) and 2. revision closed reduction of bicondylar plateau-per Dr. Carola Frost 12/30   Antibiotics:  Cefazolin per orthopedics  HPI/Subjective: -feels much better today- denies CP, no SOB  Objective: Filed Vitals:   07/08/13 0511  BP: 151/95  Pulse: 79  Temp: 97.8 F (36.6 C)  Resp: 20    Intake/Output Summary (Last 24 hours) at 07/08/13 1055 Last data filed at 07/08/13 0503  Gross per 24 hour  Intake   1461 ml  Output    550 ml  Net    911 ml   Filed Weights   07/05/13 1300 07/05/13 2301  Weight: 90.719 kg (200 lb) 126.9 kg (279 lb 12.2 oz)    Exam:  General: alert & oriented x 3 In NAD Cardiovascular: RRR, nl S1 s2 Respiratory: CTAB Abdomen: soft +BS NT/ND, no masses palpable Extremities: No cyanosis and no edema    Data Reviewed: Basic Metabolic Panel:  Recent Labs Lab 07/05/13 1356 07/05/13 1422 07/07/13 1025 07/08/13 0705  NA 137 141 141 142  K 3.5 3.5 3.6* 3.8  CL 100 102 102 104  CO2 27  --  24 28  GLUCOSE 104* 106* 103* 101*  BUN 7 6 7  4*  CREATININE 0.80 0.90 1.00 0.72  CALCIUM 8.7  --  8.3* 8.2*   Liver Function Tests:  Recent Labs Lab 07/05/13 1356  AST 25   ALT 25  ALKPHOS 93  BILITOT 0.1*  PROT 7.9  ALBUMIN 3.7   No results found for this basename: LIPASE, AMYLASE,  in the last 168 hours No results found for this basename: AMMONIA,  in the last 168 hours CBC:  Recent Labs Lab 07/05/13 1356 07/05/13 1422 07/07/13 1025 07/08/13 0705  WBC 9.9  --  10.5 8.9  HGB 12.4 13.9 10.4* 9.3*  HCT 37.1 41.0 31.7* 28.2*  MCV 87.7  --  88.5 87.0  PLT 226  --  198 211   Cardiac Enzymes: No results found for this basename: CKTOTAL, CKMB, CKMBINDEX, TROPONINI,  in the last 168 hours BNP (last 3 results) No results found for this basename: PROBNP,  in the last 8760 hours CBG: No results found for this basename: GLUCAP,  in the last 168 hours  Recent Results (from the past 240 hour(s))  SURGICAL PCR SCREEN     Status: None   Collection Time    07/06/13 10:10 PM      Result Value Range Status   MRSA, PCR NEGATIVE  NEGATIVE Final   Staphylococcus aureus NEGATIVE  NEGATIVE Final   Comment:            The Xpert SA Assay (FDA     approved for NASAL specimens     in patients over 40 years of age),  is one component of     a comprehensive surveillance     program.  Test performance has     been validated by [facility] for patients greater     than or equal to 28 year old.     It is not intended     to diagnose infection nor to     guide or monitor treatment.     Studies: Dg Tibia/fibula Right  07/07/2013   CLINICAL DATA:  Revision of external fixation.  EXAM: RIGHT TIBIA AND FIBULA - 2 VIEW; DG C-ARM 1-60 MIN  COMPARISON:  None.  FINDINGS: C-arm films document placement of external fixator device. The initial scans appear to be in the tibia but now have been removed.  IMPRESSION: As above.   Electronically Signed   By: Davonna Belling M.D.   On: 07/07/2013 11:05   Dg Knee Right Port  07/07/2013   CLINICAL DATA:  External fixation  EXAM: PORTABLE RIGHT KNEE - 1-2 VIEW  COMPARISON:  Right knee radiographs - 07/05/2013; intraoperative  radiographic images of the right knee - 07/06/2013  FINDINGS: Images re demonstrate an external fixation rod within in the mid/this aspect of the diaphysis of the femur. The known additional external fixation rod within the proximal/mid tibial diaphysis is not imaged appear  No change to minimally improved alignment of known comminuted tibial plateau fracture. Expected adjacent soft tissue swelling. Persistent findings of a lipohemarthrosis. No radiopaque foreign body.  IMPRESSION: No change to minimally improved alignment of the known comminuted tibial plateau fracture.   Electronically Signed   By: Simonne Come M.D.   On: 07/07/2013 11:02   Dg Knee Right Port  07/06/2013   CLINICAL DATA:  Right tibial plateau fracture and external fixator placement.  EXAM: PORTABLE RIGHT KNEE - 1-2 VIEW  COMPARISON:  07/05/2013  FINDINGS: Frontal and cross-table lateral view of the right knee were obtained. Fixator screw was placed through the distal femur and the proximal diaphysis of the right tibia. Again noted is a complex tibial plateau fracture with lateral displacement. Evidence for lipohemarthrosis in the suprapatellar region.  IMPRESSION: Placement of external fixator.  Comminuted and displaced tibial plateau fracture.   Electronically Signed   By: Richarda Overlie M.D.   On: 07/06/2013 14:43   Dg C-arm 1-60 Min  07/07/2013   CLINICAL DATA:  Revision of external fixation.  EXAM: RIGHT TIBIA AND FIBULA - 2 VIEW; DG C-ARM 1-60 MIN  COMPARISON:  None.  FINDINGS: C-arm films document placement of external fixator device. The initial scans appear to be in the tibia but now have been removed.  IMPRESSION: As above.   Electronically Signed   By: Davonna Belling M.D.   On: 07/07/2013 11:05    Scheduled Meds: . enoxaparin (LOVENOX) injection  40 mg Subcutaneous Q24H  . lisinopril  5 mg Oral Daily  . pneumococcal 23 valent vaccine  0.5 mL Intramuscular Tomorrow-1000  . senna  1 tablet Oral BID   Continuous Infusions:     Principal Problem:   Fracture of right tibial plateau Active Problems:   Fracture, tibial plateau   HTN (hypertension)   Severe obesity (BMI >= 40)    Time spent: 25    [facility]  [facility] Pager [phone]. If 7PM-7AM, please contact night-coverage at [email or web address], password [facility] 07/08/2013, 10:55 AM  LOS: 3 days

## 2013-07-09 LAB — CBC
HCT: 27.7 % — ABNORMAL LOW (ref 36.0–46.0)
Hemoglobin: 9 g/dL — ABNORMAL LOW (ref 12.0–15.0)
MCH: 28.7 pg (ref 26.0–34.0)
MCHC: 32.5 g/dL (ref 30.0–36.0)
MCV: 88.2 fL (ref 78.0–100.0)
Platelets: 209 10*3/uL (ref 150–400)
RBC: 3.14 MIL/uL — ABNORMAL LOW (ref 3.87–5.11)
RDW: 13.8 % (ref 11.5–15.5)
WBC: 7.8 10*3/uL (ref 4.0–10.5)

## 2013-07-09 MED ORDER — LISINOPRIL 10 MG PO TABS
10.0000 mg | ORAL_TABLET | Freq: Every day | ORAL | Status: DC
  Administered 2013-07-10: 10 mg via ORAL
  Filled 2013-07-09: qty 1

## 2013-07-09 MED ORDER — LISINOPRIL 10 MG PO TABS: 10.0000 mg | ORAL_TABLET | Freq: Every day | ORAL | Status: DC

## 2013-07-09 NOTE — Progress Notes (Signed)
Subjective: 2 Days Post-Op Procedure(s) (LRB): Ex-Fix Revision Right Tibial Plateau (Right) Patient reports pain as 4 on 0-10 scale.  Pain has somewhat subsided at rest but is a little more pronounced when up moving around.  Patient appears to be doing well otherwise.  Headache from yesterday has subsided.  Positive flatus.  No BM as of yet.  No nausea/vomiting.    Objective: Vital signs in last 24 hours: Temp:  [97.4 F (36.3 C)-98.6 F (37 C)] 97.4 F (36.3 C) (01/01 0607) Pulse Rate:  [67-81] 67 (01/01 0607) Resp:  [18] 18 (01/01 0607) BP: (129-160)/(84-86) 144/85 mmHg (01/01 0607) SpO2:  [99 %-100 %] 100 % (01/01 0607)  Intake/Output from previous day: 12/31 0701 - 01/01 0700 In: -  Out: 2250 [Urine:2250] Intake/Output this shift: Total I/O In: 360 [P.O.:360] Out: -    Recent Labs  07/07/13 1025 07/08/13 0705 07/09/13 0455  HGB 10.4* 9.3* 9.0*    Recent Labs  07/08/13 0705 07/09/13 0455  WBC 8.9 7.8  RBC 3.24* 3.14*  HCT 28.2* 27.7*  PLT 211 209    Recent Labs  07/07/13 1025 07/08/13 0705  NA 141 142  K 3.6* 3.8  CL 102 104  CO2 24 28  BUN 7 4*  CREATININE 1.00 0.72  GLUCOSE 103* 101*  CALCIUM 8.3* 8.2*   No results found for this basename: LABPT, INR,  in the last 72 hours  Neurologically intact ABD soft Neurovascular intact Sensation intact distally Intact pulses distally Dorsiflexion/Plantar flexion intact Compartment soft Negative calf tenderness No drainage through ACE or through dressing around exfix   Assessment/Plan: 2 Days Post-Op Procedure(s) (LRB): Ex-Fix Revision Right Tibial Plateau (Right) Advance diet Up with therapy Non-weight bearing Continue with toe/ankle motion  Continue with foot strap  Plan for surgery in the next 10-14 days Anticipate discharge tomorrow   Larae Grooms 07/09/2013, 9:17 AM

## 2013-07-09 NOTE — Progress Notes (Signed)
[facility] PROGRESS NOTE  Janette Harvie OHY:[id number] DOB: [birth year] DOA: 07/05/2013 PCP: No primary provider on file.  Assessment/Plan: Hypertension  -History of elevated blood pressure during pregnancy.  -will increase lisinopril to 10 mg a day for better blood pressure control and recommend to continue this dose on DC  -Needs to followup with primary care physician for HTN.  -she is doing well overall clinically, we'll sign>>she is to follow up outpatient with her PCP Obesity  -Patient's weight is 279 pounds, her BMI is 42.  -Counseled about weight loss.  -Recommend to have a polysomnogram as outpatient.  -agree If her HTN is secondary to OSA this likely to be very difficult to control without OSA/CPAP control  Constipation  conitnue miralax, senokot and prn sorbitol per primary      Procedures: Ex-Fix Revision Right Tibial Plateau (Right) and 2. revision closed reduction of bicondylar plateau-per Dr. Marcelino Scot 12/30   Antibiotics:  Cefazolin per orthopedics  HPI/Subjective: -doing well today, denies any nausea or vomiting. States no BM sofar  Objective: Filed Vitals:   07/09/13 0607  BP: 144/85  Pulse: 67  Temp: 97.4 F (36.3 C)  Resp: 18    Intake/Output Summary (Last 24 hours) at 07/09/13 1319 Last data filed at 07/09/13 0912  Gross per 24 hour  Intake    360 ml  Output   1450 ml  Net  -1090 ml   Filed Weights   07/05/13 1300 07/05/13 2301  Weight: 90.719 kg (200 lb) 126.9 kg (279 lb 12.2 oz)    Exam:  General: alert & oriented x 3 In NAD Cardiovascular: RRR, nl S1 s2 Respiratory: CTAB Abdomen: soft +BS NT/ND, no masses palpable Extremities: No cyanosis and no edema    Data Reviewed: Basic Metabolic Panel:  Recent Labs Lab 07/05/13 1356 07/05/13 1422 07/07/13 1025 07/08/13 0705  NA 137 141 141 142  K 3.5 3.5 3.6* 3.8  CL 100 102 102 104  CO2 27  --  24 28  GLUCOSE 104* 106* 103* 101*  BUN 7 6 7  4*  CREATININE 0.80 0.90 1.00  0.72  CALCIUM 8.7  --  8.3* 8.2*   Liver Function Tests:  Recent Labs Lab 07/05/13 1356  AST 25  ALT 25  ALKPHOS 93  BILITOT 0.1*  PROT 7.9  ALBUMIN 3.7   No results found for this basename: LIPASE, AMYLASE,  in the last 168 hours No results found for this basename: AMMONIA,  in the last 168 hours CBC:  Recent Labs Lab 07/05/13 1356 07/05/13 1422 07/07/13 1025 07/08/13 0705 07/09/13 0455  WBC 9.9  --  10.5 8.9 7.8  HGB 12.4 13.9 10.4* 9.3* 9.0*  HCT 37.1 41.0 31.7* 28.2* 27.7*  MCV 87.7  --  88.5 87.0 88.2  PLT 226  --  198 211 209   Cardiac Enzymes: No results found for this basename: CKTOTAL, CKMB, CKMBINDEX, TROPONINI,  in the last 168 hours BNP (last 3 results) No results found for this basename: PROBNP,  in the last 8760 hours CBG: No results found for this basename: GLUCAP,  in the last 168 hours  Recent Results (from the past 240 hour(s))  SURGICAL PCR SCREEN     Status: None   Collection Time    07/06/13 10:10 PM      Result Value Range Status   MRSA, PCR NEGATIVE  NEGATIVE Final   Staphylococcus aureus NEGATIVE  NEGATIVE Final   Comment:  The Xpert SA Assay (FDA     approved for NASAL specimens     in patients over 8 years of age),     is one component of     a comprehensive surveillance     program.  Test performance has     been validated by [employer] for patients greater     than or equal to 73 year old.     It is not intended     to diagnose infection nor to     guide or monitor treatment.     Studies: Ct Knee Right Wo Contrast  07/08/2013   CLINICAL DATA:  Tibial plateau fracture  EXAM: CT OF THE RIGHT KNEE WITHOUT CONTRAST  TECHNIQUE: Multidetector CT imaging was performed according to the standard protocol. Multiplanar CT image reconstructions were also generated.  COMPARISON:  None.  FINDINGS: There is a severely comminuted fracture of the tibial plateau. The fracture primarily involves the tibial spine. There is 2.7 cm  of lateral displacement of the lateral tibial plateau relative to the femoral condyle. There is a fracture cleft which extends to the lateral most aspect of the medial tibial plateau with 8 mm of anterior displacement. The majority of the articular surface of the medial and lateral tibial plateaus arm maintained. There are multiple fracture clefts extending into the proximal tibial metaphysis and diaphysis. The most severely comminuted fracture area is at the site of the ACL insertion. There is a fracture cleft extending to the tibial tubercle at the site of the patellar tendon insertion. There are 3 osseous fragments along the posterior lateral knee joint. There is a small 3 mm osseous fragment abutting the lateral femoral condyle peripherally.  There is a tiny chip fracture from the peripheral corner of the lateral femoral condyle.  The patellofemoral articulation is normal. There is no proximal fibular fracture. There is a large joint effusion. There is a lipomohemarthrosis. There is surrounding soft tissue edema. There is no subcutaneous emphysema.  IMPRESSION: Severely comminuted fracture of the proximal right tibia primarily involving the central portion in the region of the tibial spine with 2.7 cm of lateral displacement of the lateral tibial plateau a relative to the femoral condyle. The fracture extends into the proximal metaphysis and diaphysis with a fracture cleft extending to the tibial tuberosity at the site of the patellar tendon insertion. The ACL inserts at the site of severe comminution.   Electronically Signed   By: Kathreen Devoid   On: 07/08/2013 13:18    Scheduled Meds: . enoxaparin (LOVENOX) injection  40 mg Subcutaneous Q24H  . [START ON 07/10/2013] lisinopril  10 mg Oral Daily  . pneumococcal 23 valent vaccine  0.5 mL Intramuscular Tomorrow-1000  . senna  1 tablet Oral BID   Continuous Infusions:    Principal Problem:   Fracture of right tibial plateau Active Problems:   Fracture,  tibial plateau   HTN (hypertension)   Severe obesity (BMI >= 40)    Time spent: [facility] Pager [phone]. If 7PM-7AM, please contact night-coverage at [email or web address], password [email or web address] [facility] 07/09/2013, 1:19 PM  LOS: 4 days

## 2013-07-10 MED ORDER — OXYCODONE-ACETAMINOPHEN 5-325 MG PO TABS: 1.0000 | ORAL_TABLET | Freq: Four times a day (QID) | ORAL | Status: DC | PRN

## 2013-07-10 MED ORDER — ACETAMINOPHEN 325 MG PO TABS: 325.0000 mg | ORAL_TABLET | Freq: Four times a day (QID) | ORAL | Status: DC | PRN

## 2013-07-10 MED ORDER — OXYCODONE HCL 5 MG PO TABS: 5.0000 mg | ORAL_TABLET | ORAL | Status: DC | PRN

## 2013-07-10 MED ORDER — ENOXAPARIN SODIUM 40 MG/0.4ML ~~LOC~~ SOLN: 40.0000 mg | SUBCUTANEOUS | Status: DC

## 2013-07-10 MED ORDER — DOCUSATE SODIUM 100 MG PO CAPS: 100.0000 mg | ORAL_CAPSULE | Freq: Two times a day (BID) | ORAL | Status: DC

## 2013-07-10 MED ORDER — METHOCARBAMOL 500 MG PO TABS: 500.0000 mg | ORAL_TABLET | Freq: Four times a day (QID) | ORAL | Status: DC | PRN

## 2013-07-10 MED ORDER — POLYETHYLENE GLYCOL 3350 17 G PO PACK: 17.0000 g | PACK | Freq: Every day | ORAL | Status: DC | PRN

## 2013-07-10 MED ORDER — ENOXAPARIN (LOVENOX) PATIENT EDUCATION KIT: PACK | Freq: Once | Status: DC
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
## 2013-07-10 NOTE — Discharge Instructions (Signed)
[facility] Discharge Instructions   General Discharge Instructions  WEIGHT BEARING STATUS: Nonweightbearing Right Leg  RANGE OF MOTION/ACTIVITY: Range of motion of R ankle and toes as tolerated. Wear foot strap at night   Diet: as you were eating previously.  Can use over the counter stool softeners and bowel preparations, such as Miralax, to help with bowel movements.  Narcotics can be constipating.  Be sure to drink plenty of fluids  STOP SMOKING OR USING NICOTINE PRODUCTS!!!!  As discussed nicotine severely impairs your body's ability to heal surgical and traumatic wounds but also impairs bone healing.  Wounds and bone heal by forming microscopic blood vessels (angiogenesis) and nicotine is a vasoconstrictor (essentially, shrinks blood vessels).  Therefore, if vasoconstriction occurs to these microscopic blood vessels they essentially disappear and are unable to deliver necessary nutrients to the healing tissue.  This is one modifiable factor that you can do to dramatically increase your chances of healing your injury.    (This means no smoking, no nicotine gum, patches, etc)  DO NOT USE NONSTEROIDAL ANTI-INFLAMMATORY DRUGS (NSAID'S)  Using products such as Advil (ibuprofen), Aleve (naproxen), Motrin (ibuprofen) for additional pain control during fracture healing can delay and/or prevent the healing response.  If you would like to take over the counter (OTC) medication, Tylenol (acetaminophen) is ok.  However, some narcotic medications that are given for pain control contain acetaminophen as well. Therefore, you should not exceed more than 4000 mg of tylenol in a day if you do not have liver disease.  Also note that there are may OTC medicines, such as cold medicines and allergy medicines that my contain tylenol as well.  If you have any questions about medications and/or interactions please ask your doctor/PA or your pharmacist.   PAIN MEDICATION USE AND EXPECTATIONS  You have  likely been given narcotic medications to help control your pain.  After a traumatic event that results in an fracture (broken bone) with or without surgery, it is ok to use narcotic pain medications to help control one's pain.  We understand that everyone responds to pain differently and each individual patient will be evaluated on a regular basis for the continued need for narcotic medications. Ideally, narcotic medication use should last no more than 6-8 weeks (coinciding with fracture healing).   As a patient it is your responsibility as well to monitor narcotic medication use and report the amount and frequency you use these medications when you come to your office visit.   We would also advise that if you are using narcotic medications, you should take a dose prior to therapy to maximize you participation.  IF YOU ARE ON NARCOTIC MEDICATIONS IT IS NOT PERMISSIBLE TO OPERATE A MOTOR VEHICLE (MOTORCYCLE/CAR/TRUCK/MOPED) OR HEAVY MACHINERY DO NOT MIX NARCOTICS WITH OTHER CNS (CENTRAL NERVOUS SYSTEM) DEPRESSANTS SUCH AS ALCOHOL       ICE AND ELEVATE INJURED/OPERATIVE EXTREMITY  Using ice and elevating the injured extremity above your heart can help with swelling and pain control.  Icing in a pulsatile fashion, such as 20 minutes on and 20 minutes off, can be followed.    Do not place ice directly on skin. Make sure there is a barrier between to skin and the ice pack.    Using frozen items such as frozen peas works well as the conform nicely to the are that needs to be iced.  USE AN ACE WRAP OR TED HOSE FOR SWELLING CONTROL  In addition to icing and elevation, Ace wraps  or TED hose are used to help limit and resolve swelling.  It is recommended to use Ace wraps or TED hose until you are informed to stop.    When using Ace Wraps start the wrapping distally (farthest away from the body) and wrap proximally (closer to the body)   Example: If you had surgery on your leg or thing and you do not have a  splint on, start the ace wrap at the toes and work your way up to the thigh        If you had surgery on your upper extremity and do not have a splint on, start the ace wrap at your fingers and work your way up to the upper arm  IF YOU ARE IN A SPLINT OR CAST DO NOT [place]   If your splint gets wet for any reason please contact the office immediately. You may shower in your splint or cast as long as you keep it dry.  This can be done by wrapping in a cast cover or garbage back (or similar)  Do Not stick any thing down your splint or cast such as pencils, money, or hangers to try and scratch yourself with.  If you feel itchy take benadryl as prescribed on the bottle for itching  IF YOU ARE IN A CAM BOOT (BLACK BOOT)  You may remove boot periodically. Perform daily dressing changes as noted below.  Wash the liner of the boot regularly and wear a sock when wearing the boot. It is recommended that you sleep in the boot until told otherwise  CALL THE OFFICE WITH ANY QUESTIONS OR CONCERTS: [phone]     Discharge Pin Site Instructions  Dress pins daily with Kerlix roll starting on POD 2. Wrap the Kerlix so that it tamps the skin down around the pin-skin interface to prevent/limit motion of the skin relative to the pin.  (Pin-skin motion is the primary cause of pain and infection related to external fixator pin sites).  Remove any crust or coagulum that may obstruct drainage with a saline moistened gauze or soap and water.  After POD 3, if there is no discernable drainage on the pin site dressing, the interval for change can by increased to every other day.  You may shower with the fixator, cleaning all pin sites gently with soap and water.  If you have a surgical wound this needs to be completely dry and without drainage before showering.  The extremity can be lifted by the fixator to facilitate wound care and transfers.  Notify the office/Doctor if you experience increasing  drainage, redness, or pain from a pin site, or if you notice purulent (thick, snot-like) drainage.  Discharge Wound Care Instructions  Do NOT apply any ointments, solutions or lotions to pin sites or surgical wounds.  These prevent needed drainage and even though solutions like hydrogen peroxide kill bacteria, they also damage cells lining the pin sites that help fight infection.  Applying lotions or ointments can keep the wounds moist and can cause them to breakdown and open up as well. This can increase the risk for infection. When in doubt call the office.  Surgical incisions should be dressed daily.  If any drainage is noted, use one layer of adaptic, then gauze, Kerlix, and an ace wrap.  Once the incision is completely dry and without drainage, it may be left open to air out.  Showering may begin 36-48 hours later.  Cleaning gently with soap and  water.  Traumatic wounds should be dressed daily as well.    One layer of adaptic, gauze, Kerlix, then ace wrap.  The adaptic can be discontinued once the draining has ceased    If you have a wet to dry dressing: wet the gauze with saline the squeeze as much saline out so the gauze is moist (not soaking wet), place moistened gauze over wound, then place a dry gauze over the moist one, followed by Kerlix wrap, then ace wrap.
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
## 2013-07-10 NOTE — Discharge Planning (Signed)
Copy of home instructions, rx and match letter to pt for meds given to pt who verbalizes understanding.  Instruction on Lovenox injection done, pt gives insulin to her Mom so knows how to do injection. D'c' per her own w/c with [facility] and [facility]  Also to [place] home at 1245.

## 2013-07-13 ENCOUNTER — Encounter (HOSPITAL_COMMUNITY): Payer: Self-pay | Admitting: Orthopedic Surgery

## 2013-07-13 NOTE — Progress Notes (Signed)
Received call from pt who discharged from [facility] on 07/10/2013. She stated that she worked with PT here and agreed with them that home with [facility] was a safe option for her but now that she has been home for several days, she doesn't feel it is a safe option for her. Stated that she has maintained her NWB status but that her leg is very heavy and she has banged it into things and that the pain and swelling is the same or worse, rather than better.    Pt has no insurance at this time.  She asked about being able to go to a nursing facility.  That would have to be private pay which she can't afford to do.  She currently has a rolling walker, bedside commode and wheelchair and a HHRN is coming to do pin care and address the wounds pt has. She was willing to try to work with a HHPT to see if they could come for one [place] visit and work with her in her surroundings to see what they can recommend for mobility that might make the patient safer until her ORIF surgery.  Pt stated that she had a Medicaid application pending but I don't know that she will be approved with only one extremity out and even if she did, Medicaid wouldn't pay for PT for a single RLE fracture. Pt thinks she will do much better once the ex fix is removed from her leg.   I called Dr. Carlean Jews office and spoke with [facility] about pt's needs as I didn't want to interrupt MD in the OR with this non-emergent need.  I advised the patient that I would get back with her (or Gwen would) by the end of the day to let her know what we can work out for her. She was agreeable to this solution.  Pt's phone number is [phone].
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
## 2013-07-16 NOTE — Progress Notes (Signed)
I have seen and examined the patient. I agree with the findings above.  Camren Lipsett H, MD  

## 2013-07-20 ENCOUNTER — Other Ambulatory Visit (HOSPITAL_COMMUNITY): Payer: Self-pay | Admitting: *Deleted

## 2013-07-20 ENCOUNTER — Encounter (HOSPITAL_COMMUNITY): Payer: Self-pay | Admitting: Pharmacy Technician

## 2013-07-20 ENCOUNTER — Encounter (HOSPITAL_COMMUNITY): Payer: Self-pay | Admitting: *Deleted

## 2013-07-20 MED ORDER — ACETAMINOPHEN 500 MG PO TABS
1000.0000 mg | ORAL_TABLET | Freq: Once | ORAL | Status: AC
  Administered 2013-07-21: 1000 mg via ORAL
  Filled 2013-07-20: qty 2

## 2013-07-20 MED ORDER — CHLORHEXIDINE GLUCONATE 4 % EX LIQD: 60.0000 mL | Freq: Once | CUTANEOUS | Status: DC

## 2013-07-20 MED ORDER — CEFAZOLIN SODIUM 10 G IJ SOLR
3.0000 g | INTRAMUSCULAR | Status: DC
  Filled 2013-07-20 (×2): qty 3000

## 2013-07-20 NOTE — Progress Notes (Signed)
Pt states she was instructed to take Lovenox today, but not tomorrow (day of surgery).

## 2013-07-20 NOTE — Interval H&P Note (Signed)
History and Physical Interval Note:  Pt seen in office last week. No interval changes. Now presents for ORIF of complex R tibial plateau fx with removal of ex fix.   Anticipate 2-3 day hospital stay after surgery NWB x 8 weeks Unrestricted ROM R knee   07/20/2013 7:28 PM  Carol Vargas  has presented today for surgery, with the diagnosis of Right Tibial Plateau Fracture  The various methods of treatment have been discussed with the patient and family. After consideration of risks, benefits and other options for treatment, the patient has consented to  Procedure(s): OPEN REDUCTION INTERNAL FIXATION (ORIF) RIGHT TIBIAL PLATEAU (Right) REMOVAL EXTERNAL FIXATION RIGHT LEG (Right) as a surgical intervention .  The patient's history has been reviewed, patient examined, no change in status, stable for surgery.  I have reviewed the patient's chart and labs.  Questions were answered to the patient's satisfaction.     Jari Pigg, PA-C [facility] [phone] (P)

## 2013-07-20 NOTE — Progress Notes (Signed)
Called Dr. Carlean Jews office for pre-op orders. Spoke with [employer].
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
## 2013-07-20 NOTE — H&P (View-Only) (Signed)
I have seen and examined the patient. I agree with the findings above.  Right bicondylar tibial plateau fracture with shaft extension,  Schatzker 6 No sensory or motor deficits.  Proximal tibial pin in close enough proximity to fracture to constitute infection risk; excellent alignment, but strongly recommend return to OR for revision of pin and attempt at gaining more length.  I discussed with the patient the risks and benefits of surgery, including the possibility of infection, nerve injury, vessel injury, wound breakdown, arthritis, symptomatic hardware, DVT/ PE, loss of motion, and need for further surgery among others.  We also specifically discussed the need to stage surgery because of the elevated risk of soft tissue breakdown that could lead to amputation.  She understood these risks and wished to proceed.   Tandra Rosado H, MD 2:37 PM   

## 2013-07-21 ENCOUNTER — Inpatient Hospital Stay (HOSPITAL_COMMUNITY): Payer: Medicaid Other

## 2013-07-21 ENCOUNTER — Inpatient Hospital Stay (HOSPITAL_COMMUNITY): Payer: Medicaid Other | Admitting: Anesthesiology

## 2013-07-21 ENCOUNTER — Encounter (HOSPITAL_COMMUNITY)
Admission: RE | Disposition: A | Discharge status: Home / Self Care | Payer: Self-pay | Source: Ambulatory Visit | Attending: Orthopedic Surgery

## 2013-07-21 ENCOUNTER — Ambulatory Visit (HOSPITAL_COMMUNITY)
Admission: RE | Admit: 2013-07-21 | Discharge: 2013-07-21 | DRG: 563 | Disposition: A | Discharge status: Home / Self Care | Payer: Medicaid Other | Source: Ambulatory Visit | Attending: Orthopedic Surgery | Admitting: Orthopedic Surgery

## 2013-07-21 ENCOUNTER — Encounter (HOSPITAL_COMMUNITY): Payer: Medicaid Other | Admitting: Anesthesiology

## 2013-07-21 ENCOUNTER — Encounter (HOSPITAL_COMMUNITY): Payer: Self-pay | Admitting: Anesthesiology

## 2013-07-21 DIAGNOSIS — S82143A Displaced bicondylar fracture of unspecified tibia, initial encounter for closed fracture: Secondary | ICD-10-CM | POA: Diagnosis present

## 2013-07-21 DIAGNOSIS — E669 Obesity, unspecified: Secondary | ICD-10-CM | POA: Diagnosis present

## 2013-07-21 DIAGNOSIS — I10 Essential (primary) hypertension: Secondary | ICD-10-CM | POA: Diagnosis present

## 2013-07-21 DIAGNOSIS — I160 Hypertensive urgency: Secondary | ICD-10-CM | POA: Diagnosis present

## 2013-07-21 DIAGNOSIS — S82109A Unspecified fracture of upper end of unspecified tibia, initial encounter for closed fracture: Secondary | ICD-10-CM

## 2013-07-21 DIAGNOSIS — Z5309 Procedure and treatment not carried out because of other contraindication: Secondary | ICD-10-CM

## 2013-07-21 DIAGNOSIS — G4733 Obstructive sleep apnea (adult) (pediatric): Secondary | ICD-10-CM | POA: Diagnosis present

## 2013-07-21 HISTORY — DX: Headache: R51

## 2013-07-21 HISTORY — DX: Essential (primary) hypertension: I10

## 2013-07-21 LAB — URINE MICROSCOPIC-ADD ON

## 2013-07-21 LAB — CBC WITH DIFFERENTIAL/PLATELET
BASOS ABS: 0 10*3/uL (ref 0.0–0.1)
BASOS PCT: 1 % (ref 0–1)
EOS ABS: 0.2 10*3/uL (ref 0.0–0.7)
Eosinophils Relative: 3 % (ref 0–5)
HCT: 33.5 % — ABNORMAL LOW (ref 36.0–46.0)
HEMOGLOBIN: 10.8 g/dL — AB (ref 12.0–15.0)
Lymphocytes Relative: 24 % (ref 12–46)
Lymphs Abs: 1.9 10*3/uL (ref 0.7–4.0)
MCH: 28.4 pg (ref 26.0–34.0)
MCHC: 32.2 g/dL (ref 30.0–36.0)
MCV: 88.2 fL (ref 78.0–100.0)
MONO ABS: 0.6 10*3/uL (ref 0.1–1.0)
MONOS PCT: 7 % (ref 3–12)
Neutro Abs: 5.2 10*3/uL (ref 1.7–7.7)
Neutrophils Relative %: 66 % (ref 43–77)
Platelets: 437 10*3/uL — ABNORMAL HIGH (ref 150–400)
RBC: 3.8 MIL/uL — ABNORMAL LOW (ref 3.87–5.11)
RDW: 14.3 % (ref 11.5–15.5)
WBC: 7.9 10*3/uL (ref 4.0–10.5)

## 2013-07-21 LAB — COMPREHENSIVE METABOLIC PANEL
ALK PHOS: 109 U/L (ref 39–117)
ALT: 20 U/L (ref 0–35)
AST: 16 U/L (ref 0–37)
Albumin: 3.2 g/dL — ABNORMAL LOW (ref 3.5–5.2)
BUN: 11 mg/dL (ref 6–23)
CALCIUM: 9.3 mg/dL (ref 8.4–10.5)
CHLORIDE: 99 meq/L (ref 96–112)
CO2: 28 meq/L (ref 19–32)
Creatinine, Ser: 0.79 mg/dL (ref 0.50–1.10)
GFR calc Af Amer: >90 mL/min (ref >90)
Glucose, Bld: 96 mg/dL (ref 70–99)
Potassium: 4 mEq/L (ref 3.7–5.3)
Sodium: 140 mEq/L (ref 137–147)
Total Bilirubin: 0.3 mg/dL (ref 0.3–1.2)
Total Protein: 7.9 g/dL (ref 6.0–8.3)

## 2013-07-21 LAB — URINALYSIS, ROUTINE W REFLEX MICROSCOPIC
Bilirubin Urine: NEGATIVE
GLUCOSE, UA: NEGATIVE mg/dL
Ketones, ur: NEGATIVE mg/dL
Nitrite: NEGATIVE
PH: 6 (ref 5.0–8.0)
Protein, ur: NEGATIVE mg/dL
SPECIFIC GRAVITY, URINE: 1.026 (ref 1.005–1.030)
Urobilinogen, UA: 0.2 mg/dL (ref 0.0–1.0)

## 2013-07-21 LAB — APTT: aPTT: 35 seconds (ref 24–37)

## 2013-07-21 LAB — PROTIME-INR
INR: 0.96 (ref 0.00–1.49)
Prothrombin Time: 12.6 seconds (ref 11.6–15.2)

## 2013-07-21 LAB — HCG, SERUM, QUALITATIVE: Preg, Serum: NEGATIVE

## 2013-07-21 IMAGING — CR DG CHEST 2V
2 series · 2 of 2 positions shown · non-contrast
Comparison: 07/05/2013.

CLINICAL DATA: Preop examinations hypertension.

EXAM:
CHEST  2 VIEW

[w chest lat]
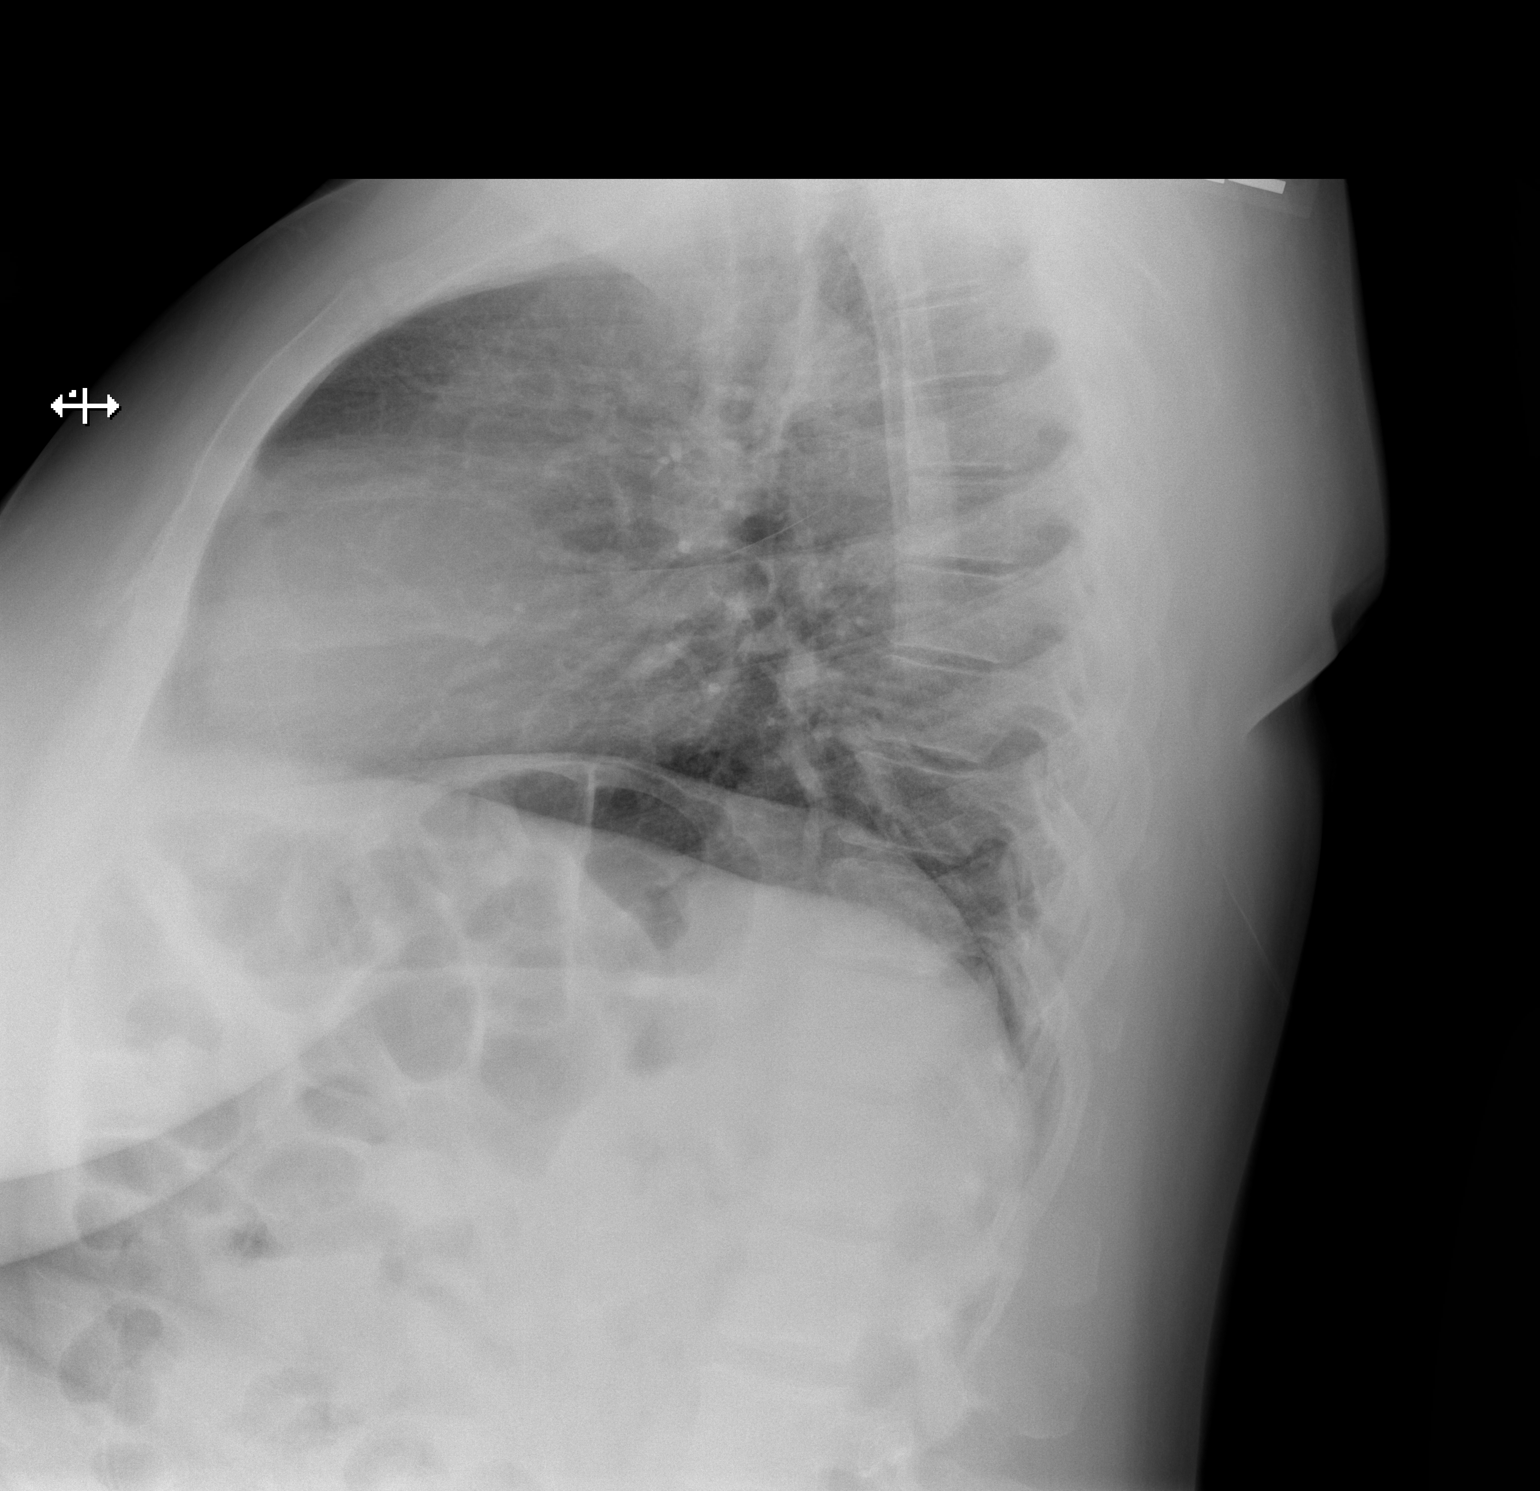

[x chest ap]
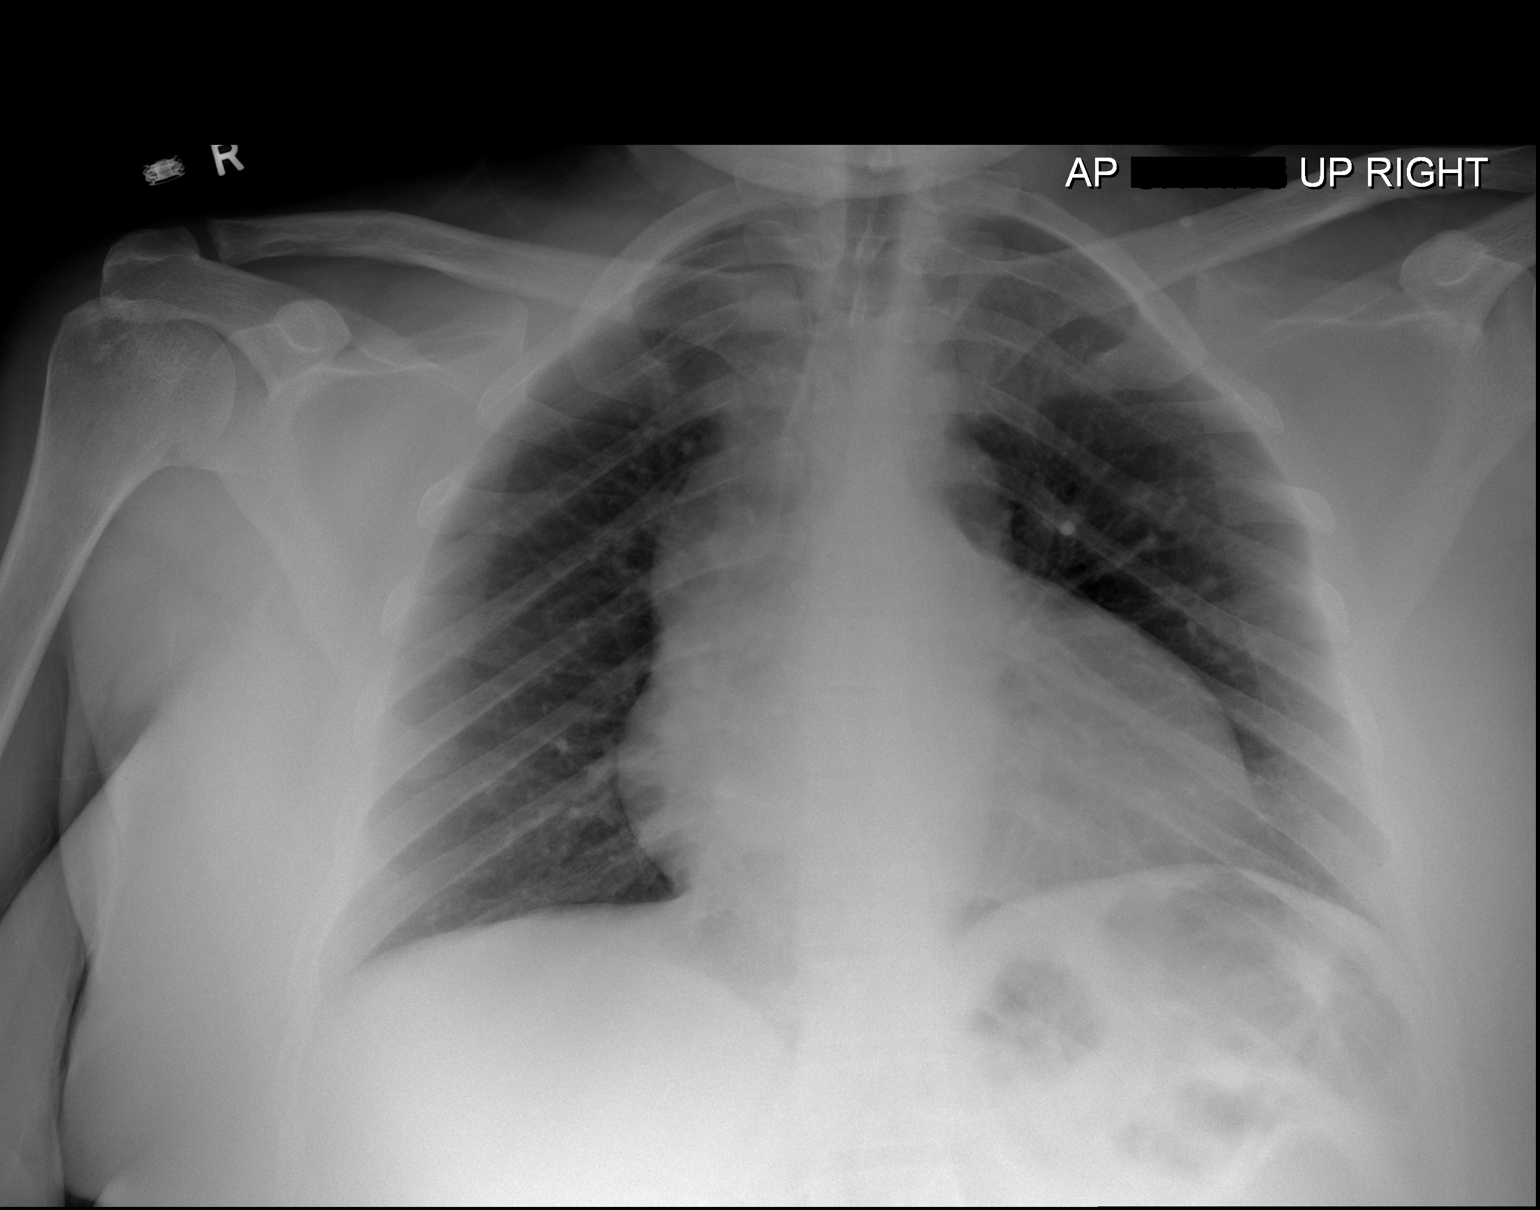

[2 of 2 positions shown; findings below may reference images not displayed]

FINDINGS: Chronic cardiomegaly. No failure. No infiltrate, effusion, or
pneumothorax. Negative osseous structures.
IMPRESSION: Cardiomegaly without edema.

## 2013-07-21 SURGERY — OPEN REDUCTION INTERNAL FIXATION (ORIF) TIBIAL PLATEAU
Anesthesia: General | Site: Leg Lower | Laterality: Right

## 2013-07-21 MED ORDER — LISINOPRIL 10 MG PO TABS
10.0000 mg | ORAL_TABLET | Freq: Every day | ORAL | Status: DC
  Administered 2013-07-21: 10 mg via ORAL
  Filled 2013-07-21: qty 1

## 2013-07-21 MED ORDER — ISOSORB DINITRATE-HYDRALAZINE 20-37.5 MG PO TABS
1.0000 | ORAL_TABLET | Freq: Two times a day (BID) | ORAL | Status: DC
  Administered 2013-07-21: 1 via ORAL
  Filled 2013-07-21 (×2): qty 1

## 2013-07-21 MED ORDER — METHOCARBAMOL 500 MG PO TABS
1000.0000 mg | ORAL_TABLET | Freq: Once | ORAL | Status: AC
  Administered 2013-07-21: 1000 mg via ORAL
  Filled 2013-07-21: qty 2

## 2013-07-21 MED ORDER — CLONIDINE HCL 0.2 MG PO TABS: 0.2000 mg | ORAL_TABLET | Freq: Two times a day (BID) | ORAL | Status: DC

## 2013-07-21 MED ORDER — OXYCODONE-ACETAMINOPHEN 5-325 MG PO TABS
ORAL_TABLET | ORAL | Status: AC
  Filled 2013-07-21: qty 2

## 2013-07-21 MED ORDER — LACTATED RINGERS IV SOLN: INTRAVENOUS | Status: DC

## 2013-07-21 MED ORDER — ISOSORB DINITRATE-HYDRALAZINE 20-37.5 MG PO TABS: 1.0000 | ORAL_TABLET | Freq: Two times a day (BID) | ORAL | Status: DC

## 2013-07-21 MED ORDER — 0.9 % SODIUM CHLORIDE (POUR BTL) OPTIME: TOPICAL | Status: DC | PRN

## 2013-07-21 MED ORDER — CLONIDINE HCL 0.2 MG PO TABS
0.2000 mg | ORAL_TABLET | Freq: Two times a day (BID) | ORAL | Status: DC
  Filled 2013-07-21 (×2): qty 1

## 2013-07-21 MED ORDER — OXYCODONE-ACETAMINOPHEN 5-325 MG PO TABS
2.0000 | ORAL_TABLET | Freq: Once | ORAL | Status: AC
  Administered 2013-07-21: 2 via ORAL

## 2013-07-21 SURGICAL SUPPLY — 80 items
BANDAGE ELASTIC 4 VELCRO ST LF (GAUZE/BANDAGES/DRESSINGS) IMPLANT
BANDAGE ELASTIC 6 VELCRO ST LF (GAUZE/BANDAGES/DRESSINGS) IMPLANT
BANDAGE ESMARK 6X9 LF (GAUZE/BANDAGES/DRESSINGS) IMPLANT
BANDAGE GAUZE ELAST BULKY 4 IN (GAUZE/BANDAGES/DRESSINGS) IMPLANT
BLADE SURG 10 STRL SS (BLADE) IMPLANT
BLADE SURG 15 STRL LF DISP TIS (BLADE) IMPLANT
BLADE SURG 15 STRL SS (BLADE)
BLADE SURG ROTATE 9660 (MISCELLANEOUS) IMPLANT
BNDG CMPR 9X6 STRL LF SNTH (GAUZE/BANDAGES/DRESSINGS)
BNDG COHESIVE 4X5 TAN STRL (GAUZE/BANDAGES/DRESSINGS) IMPLANT
BNDG COHESIVE 6X5 TAN STRL LF (GAUZE/BANDAGES/DRESSINGS) IMPLANT
BNDG ESMARK 6X9 LF (GAUZE/BANDAGES/DRESSINGS)
BRUSH SCRUB DISP (MISCELLANEOUS) IMPLANT
CLEANER TIP ELECTROSURG 2X2 (MISCELLANEOUS) IMPLANT
CLOTH BEACON ORANGE TIMEOUT ST (SAFETY) IMPLANT
COVER MAYO STAND STRL (DRAPES) IMPLANT
COVER SURGICAL LIGHT HANDLE (MISCELLANEOUS) IMPLANT
CUFF TOURNIQUET SINGLE 18IN (TOURNIQUET CUFF) IMPLANT
CUFF TOURNIQUET SINGLE 24IN (TOURNIQUET CUFF) IMPLANT
CUFF TOURNIQUET SINGLE 34IN LL (TOURNIQUET CUFF) IMPLANT
DRAPE C-ARM 42X72 X-RAY (DRAPES) IMPLANT
DRAPE C-ARMOR (DRAPES) IMPLANT
DRAPE INCISE IOBAN 66X45 STRL (DRAPES) IMPLANT
DRAPE OEC MINIVIEW 54X84 (DRAPES) IMPLANT
DRAPE ORTHO SPLIT 77X108 STRL (DRAPES)
DRAPE SURG ORHT 6 SPLT 77X108 (DRAPES) IMPLANT
DRAPE U-SHAPE 47X51 STRL (DRAPES) IMPLANT
DRSG ADAPTIC 3X8 NADH LF (GAUZE/BANDAGES/DRESSINGS) IMPLANT
DRSG PAD ABDOMINAL 8X10 ST (GAUZE/BANDAGES/DRESSINGS) IMPLANT
ELECT REM PT RETURN 9FT ADLT (ELECTROSURGICAL)
ELECTRODE REM PT RTRN 9FT ADLT (ELECTROSURGICAL) IMPLANT
EVACUATOR 1/8 PVC DRAIN (DRAIN) IMPLANT
EVACUATOR 3/16  PVC DRAIN (DRAIN)
EVACUATOR 3/16 PVC DRAIN (DRAIN) IMPLANT
GLOVE BIO SURGEON STRL SZ7.5 (GLOVE) IMPLANT
GLOVE BIO SURGEON STRL SZ8 (GLOVE) IMPLANT
GLOVE BIOGEL PI IND STRL 7.5 (GLOVE) IMPLANT
GLOVE BIOGEL PI IND STRL 8 (GLOVE) IMPLANT
GLOVE BIOGEL PI INDICATOR 7.5 (GLOVE)
GLOVE BIOGEL PI INDICATOR 8 (GLOVE)
GOWN PREVENTION PLUS XLARGE (GOWN DISPOSABLE) IMPLANT
GOWN STRL NON-REIN LRG LVL3 (GOWN DISPOSABLE) IMPLANT
IMMOBILIZER KNEE 22 UNIV (SOFTGOODS) IMPLANT
KIT BASIN OR (CUSTOM PROCEDURE TRAY) IMPLANT
KIT ROOM TURNOVER OR (KITS) IMPLANT
MANIFOLD NEPTUNE II (INSTRUMENTS) IMPLANT
NDL SUT 6 .5 CRC .975X.05 MAYO (NEEDLE) IMPLANT
NEEDLE 22X1 1/2 (OR ONLY) (NEEDLE) IMPLANT
NEEDLE MAYO TAPER (NEEDLE)
NS IRRIG 1000ML POUR BTL (IV SOLUTION) IMPLANT
PACK ORTHO EXTREMITY (CUSTOM PROCEDURE TRAY) IMPLANT
PAD ARMBOARD 7.5X6 YLW CONV (MISCELLANEOUS) IMPLANT
PAD CAST 4YDX4 CTTN HI CHSV (CAST SUPPLIES) IMPLANT
PADDING CAST COTTON 4X4 STRL (CAST SUPPLIES)
PADDING CAST COTTON 6X4 STRL (CAST SUPPLIES) IMPLANT
SPONGE GAUZE 4X4 12PLY (GAUZE/BANDAGES/DRESSINGS) IMPLANT
SPONGE LAP 18X18 X RAY DECT (DISPOSABLE) IMPLANT
SPONGE SCRUB IODOPHOR (GAUZE/BANDAGES/DRESSINGS) IMPLANT
STAPLER VISISTAT 35W (STAPLE) IMPLANT
STOCKINETTE IMPERVIOUS LG (DRAPES) IMPLANT
STRIP CLOSURE SKIN 1/2X4 (GAUZE/BANDAGES/DRESSINGS) IMPLANT
SUCTION FRAZIER TIP 10 FR DISP (SUCTIONS) IMPLANT
SUT ETHILON 3 0 PS 1 (SUTURE) IMPLANT
SUT PDS AB 2-0 CT1 27 (SUTURE) IMPLANT
SUT PROLENE 0 CT 2 (SUTURE) IMPLANT
SUT VIC AB 0 CT1 27 (SUTURE)
SUT VIC AB 0 CT1 27XBRD ANBCTR (SUTURE) IMPLANT
SUT VIC AB 1 CT1 27 (SUTURE)
SUT VIC AB 1 CT1 27XBRD ANBCTR (SUTURE) IMPLANT
SUT VIC AB 2-0 CT1 27 (SUTURE)
SUT VIC AB 2-0 CT1 TAPERPNT 27 (SUTURE) IMPLANT
SYR 20ML ECCENTRIC (SYRINGE) IMPLANT
SYR CONTROL 10ML LL (SYRINGE) IMPLANT
TOWEL OR 17X24 6PK STRL BLUE (TOWEL DISPOSABLE) IMPLANT
TOWEL OR 17X26 10 PK STRL BLUE (TOWEL DISPOSABLE) IMPLANT
TRAY FOLEY CATH 16FRSI W/METER (SET/KITS/TRAYS/PACK) IMPLANT
TUBE CONNECTING 12X1/4 (SUCTIONS) IMPLANT
UNDERPAD 30X30 INCONTINENT (UNDERPADS AND DIAPERS) IMPLANT
WATER STERILE IRR 1000ML POUR (IV SOLUTION) IMPLANT
YANKAUER SUCT BULB TIP NO VENT (SUCTIONS) IMPLANT

## 2013-07-21 NOTE — Consult Note (Addendum)
[facility] Medical Consultation  Carol Vargas UXN:[id number] DOB: [birth year] DOA: 07/21/2013 PCP: No primary provider on file.   Requesting physician: Marcelino Scot Date of consultation: 07/21/13 Reason for consultation: PEri-op HTn management  Impression/Recommendations Principal Problem:   Asymptomatic hypertensive urgency-in Active Problems:   Fracture, tibial plateau   Severe obesity (BMI >= 40)   HTN, goal below 130/80   OSA (obstructive sleep apnea)    1. Accelerated hypertension-we'll give 5 mg lisinopril stat, start Bidil 20-37.5 bid Stat.  She will need close follow-up in the outpatient setting. Perioperative beta blockade is a reasonable choice if surgery can be delayed about 14 days, but it increases risk if started prior to that 14 day window, hence I would recommend using either a calcium channel blocker nitroglycerin combination medication [quickest way to drop the blood pressure is likely a nitrate] and maybe adding a low-dose thiazide given she is an [place] American female and has high renin hypertension. I have also added clonidine 0.2 mg twice a day short-term to control the pressure-this can probably be weaned off in the outpatient setting        I suspect that pain is driving a lot of her hypertension as well as anxiety that the procedure and I will let anesthesiology determine what the best medication to give the patient for this indication      She will nevertheless need close follow-up in the outpatient setting and I called Dr. Karie Kirks, FP in [place] close to where the patient lives [her home is in [place]] and this appt is set up on 07/29/13, @09 :48.      Chief Complaint: tibial plateau fracture,hypertensive urgency  HPI:  This pleasant 38 year old female recently admitted to the hospital Right bicondylar tibial plateau fracture with shaft extension,  Schatzker 6, OTA classification 41-C3. recent external fixator placed 07/05/13 by Dr. Erlinda Hong, recent diagnosis of  both hypertension as well as class III in obesity at that hospital stay was readmitted 1/13/215 for definitive management and fixation as per Dr. Marcelino Scot Her initial hospital course was complicated by subluxation of the lateral plateau from under the lateral femoral condyle and she had a revision external fixation at that time on 12/30. She came to the preop this morning and had not taken any pain make medications last night at 8 PM. She was seen by home health nurse and it was noted that although her systolic blood pressure is 2:54, diastolic was 270. She states she's been taking all of her medications and in the perioperative area, her blood pressures went as high as 200/110. Per anesthesiology, it was thought that patient presented a relatively high risk for acute event perioperatively with blood sugars this high and hence internal medicine was consulted to help with medications She is a little anxious and she states that she's currently in 10/10 pain. She does not have a primary care physician.    Review of Systems:  Denies nausea vomiting chest pain blurred vision double visionweakness in any one side of body Does state that she has a headache currently  Past Medical History  Diagnosis Date  . Hypertension   . Headache(784.0)     at times, nothing severe per pt.   Past Surgical History  Procedure Laterality Date  . Cesarean section    . External fixation leg Right 07/05/2013    Procedure: EXTERNAL FIXATION LEG;  Surgeon: Marianna Payment, MD;  Location: [place];  Service: Orthopedics;  Laterality: Right;  . External fixation leg Right 07/07/2013  Procedure: Ex-Fix Revision Right Tibial Plateau;  Surgeon: Rozanna Box, MD;  Location: [place];  Service: Orthopedics;  Laterality: Right;  . Tonsillectomy  1992  . Dilation and curettage of uterus     Social History:  reports that she has never smoked. She has never used smokeless tobacco. She reports that she does not drink alcohol or  use illicit drugs.  No Known Allergies History reviewed. No pertinent family history.  Prior to Admission medications   Medication Sig Start Date End Date Taking? Authorizing Provider  acetaminophen (TYLENOL) 325 MG tablet Take 1-2 tablets (325-650 mg total) by mouth every 6 (six) hours as needed for mild pain, fever or headache. 07/10/13  Yes Jari Pigg, PA-C  docusate sodium (COLACE) 100 MG capsule Take 200 mg by mouth daily.   Yes Historical Provider, MD  enoxaparin (LOVENOX) 40 MG/0.4ML injection Inject 0.4 mLs (40 mg total) into the skin daily. 07/10/13  Yes Jari Pigg, PA-C  lisinopril (PRINIVIL,ZESTRIL) 10 MG tablet Take 1 tablet (10 mg total) by mouth daily. 07/10/13  Yes Adeline Saralyn Pilar, MD  methocarbamol (ROBAXIN) 500 MG tablet Take 1-2 tablets (500-1,000 mg total) by mouth every 6 (six) hours as needed for muscle spasms. 07/10/13  Yes Jari Pigg, PA-C  oxyCODONE (OXY IR/ROXICODONE) 5 MG immediate release tablet Take 1-2 tablets (5-10 mg total) by mouth every 3 (three) hours as needed for breakthrough pain (take between percocet). 07/10/13  Yes Jari Pigg, PA-C  oxyCODONE-acetaminophen (PERCOCET/ROXICET) 5-325 MG per tablet Take 1-2 tablets by mouth every 6 (six) hours as needed for moderate pain or severe pain. 07/10/13  Yes Jari Pigg, PA-C  polyethylene glycol [facility] / GLYCOLAX) packet Take 17 g by mouth daily as needed for mild constipation. 07/10/13  Yes Jari Pigg, PA-C   Physical Exam: Blood pressure 196/106, pulse 84, temperature 98.1 F (36.7 C), temperature source Oral, resp. rate 18, height 5\' 9"  (1.753 m), weight 117.283 kg (258 lb 9 oz), last menstrual period 07/08/2013, SpO2 99.00%. Filed Vitals:   07/21/13 0747  BP: 196/106  Pulse:   Temp:   Resp:      General:  Alert obese African American female  Eyes: no icterus or pallor  ENT: Mallampati 2  Neck: soft nontender nondistended no thyromegaly  Cardiovascular: S1-S2 no murmur rub or gallop  Respiratory:  clinically clear  Abdomen: soft nontender nondistended no rebound  Skin: no lower extremity edema, external fixator in place right lower extremity  Musculoskeletal: range of motion intact  Psychiatric: euthymic  Neurologic: moving all 4 limbs equally  Labs on Admission:  Basic Metabolic Panel:  Recent Labs Lab 07/21/13 0648  NA 140  K 4.0  CL 99  CO2 28  GLUCOSE 96  BUN 11  CREATININE 0.79  CALCIUM 9.3   Liver Function Tests:  Recent Labs Lab 07/21/13 0648  AST 16  ALT 20  ALKPHOS 109  BILITOT 0.3  PROT 7.9  ALBUMIN 3.2*   No results found for this basename: LIPASE, AMYLASE,  in the last 168 hours No results found for this basename: AMMONIA,  in the last 168 hours CBC:  Recent Labs Lab 07/21/13 0648  WBC 7.9  NEUTROABS 5.2  HGB 10.8*  HCT 33.5*  MCV 88.2  PLT 437*   Cardiac Enzymes: No results found for this basename: CKTOTAL, CKMB, CKMBINDEX, TROPONINI,  in the last 168 hours BNP: No components found with this basename: POCBNP,  CBG: No results found for this  basename: GLUCAP,  in the last 168 hours  Radiological Exams on Admission: Dg Chest 2 View  07/21/2013   CLINICAL DATA:  Preop examinations hypertension.  EXAM: CHEST  2 VIEW  COMPARISON:  07/05/2013.  FINDINGS: Chronic cardiomegaly. No failure. No infiltrate, effusion, or pneumothorax. Negative osseous structures.  IMPRESSION: Cardiomegaly without edema.   Electronically Signed   By: Jorje Guild M.D.   On: 07/21/2013 07:11    EKG: Independently reviewed. None perfomred  Time spent: [place], [facility] Pager [phone]  If 7PM-7AM, please contact night-coverage [email or web address] Password [facility] 07/21/2013, 8:49 AM

## 2013-07-21 NOTE — Progress Notes (Signed)
Update  Patient has now informed of [place] that her pain level and anxiety re surgery is significant.  She has not had any narcotics since 8pm yesterday.  She started lisinopril on last admission in late Dec, with increase to 10mg  dose on Jan 1st.  Review of prior OR records does show very well controlled pressure intraop.  She does not have a follow up PCP appt scheduled at this time, despite plans to do so, and it is unlikely she would be seen within the next two days, thereby delaying surgery until the following week.  She has already been in the fixator over two weeks.  I have asked internal medicine service to reevaluate BP with new recs in case of either surgery or discharge.  I have also asked Dr. Conrad Granville to allow administration of narcotics to control pain or meds to address anxiety and then to carefully reassess BP, particularly given previous actual intraop numbers that demonstrated excellent control.  Carol Bayfield, MD [facility] [phone] (p)

## 2013-07-21 NOTE — Progress Notes (Signed)
Call to pharmacy due to unread message sent for lisinopril and isosorbide/hydralazine. They will send ASAP.

## 2013-07-21 NOTE — Progress Notes (Signed)
Pt said she could not wait any longer because she needed to go home and rest. She said she had all that she needed with her prescriptions and what I had given her ( the phone number and appointment time for Dr. Karie Kirks ( 1/21 at 0950)).

## 2013-07-21 NOTE — Progress Notes (Signed)
I have seen and examined the patient. I agree with the findings above.  I discussed with the patient the risks and benefits of surgery for right tibial plateau fracture, including the possibility of infection, nerve injury, vessel injury, wound breakdown, arthritis, symptomatic hardware, DVT/ PE, loss of motion, and need for further surgery among others.  She understood these risks and wished to proceed.  Unfortunately, diastolic simply too high today. 165mmhg aand opinion of Dr. Orson Gear that poses stroke risk.  Will reschedule.  Rozanna Box, MD 07/21/2013

## 2013-07-21 NOTE — Progress Notes (Signed)
Pt was discharged to home with her mother.

## 2013-07-21 NOTE — Progress Notes (Signed)
Preop  Pressure rebounded to 124mmHg.  Simply too dangerous.  Will reschedule. Hospitalists to see with additional BP recs.  Altamese Ralston, MD [facility] [phone] (p)

## 2013-07-21 NOTE — Progress Notes (Signed)
Page to Dr. Verlon Au to inform him of patient's BP 151/94. Okay to DC from his standpoint and asked for me to call Dr. Marcelino Scot for his okay.  Dr. Marcelino Scot informed of Dr. Arlyss Queen instructions for patient to take the 3 BP meds ordered here, and that he would message Dr. Marcelino Scot about these and any other instructions for patient's discharge. Also that he has spoken with Dr. Joen Laura in [place] and has scheduled an appointment with him for F/U for patient.

## 2013-07-21 NOTE — Progress Notes (Signed)
Verbal order at 0940 from Dr. Verlon Au to give lisinopril and hydralazine which he ordered now, along with a heart healthy diet.

## 2013-07-21 NOTE — Progress Notes (Signed)
Unable to access [facility] to scan and administer meds.  Call to 2link to fix.

## 2013-07-21 NOTE — Preoperative (Signed)
Beta Blockers   Reason not to administer Beta Blockers:Not Applicable 

## 2013-07-22 LAB — URINE CULTURE

## 2013-07-25 ENCOUNTER — Encounter (HOSPITAL_COMMUNITY): Payer: Self-pay | Admitting: *Deleted

## 2013-07-27 ENCOUNTER — Emergency Department (HOSPITAL_COMMUNITY): Payer: Medicaid Other

## 2013-07-27 ENCOUNTER — Inpatient Hospital Stay (HOSPITAL_COMMUNITY)
Admission: EM | Admit: 2013-07-27 | Discharge: 2013-07-31 | DRG: 488 | Disposition: A | Discharge status: Skilled Nursing Facility | Payer: Medicaid Other | Attending: Orthopedic Surgery | Admitting: Orthopedic Surgery

## 2013-07-27 ENCOUNTER — Encounter (HOSPITAL_COMMUNITY): Payer: Self-pay | Admitting: Emergency Medicine

## 2013-07-27 DIAGNOSIS — E669 Obesity, unspecified: Secondary | ICD-10-CM | POA: Diagnosis present

## 2013-07-27 DIAGNOSIS — S82141A Displaced bicondylar fracture of right tibia, initial encounter for closed fracture: Secondary | ICD-10-CM | POA: Diagnosis present

## 2013-07-27 DIAGNOSIS — S82109A Unspecified fracture of upper end of unspecified tibia, initial encounter for closed fracture: Principal | ICD-10-CM | POA: Diagnosis present

## 2013-07-27 DIAGNOSIS — Z79899 Other long term (current) drug therapy: Secondary | ICD-10-CM

## 2013-07-27 DIAGNOSIS — Y92009 Unspecified place in unspecified non-institutional (private) residence as the place of occurrence of the external cause: Secondary | ICD-10-CM

## 2013-07-27 DIAGNOSIS — D62 Acute posthemorrhagic anemia: Secondary | ICD-10-CM | POA: Diagnosis not present

## 2013-07-27 DIAGNOSIS — Z23 Encounter for immunization: Secondary | ICD-10-CM

## 2013-07-27 DIAGNOSIS — W19XXXA Unspecified fall, initial encounter: Secondary | ICD-10-CM | POA: Diagnosis present

## 2013-07-27 DIAGNOSIS — Z87891 Personal history of nicotine dependence: Secondary | ICD-10-CM

## 2013-07-27 DIAGNOSIS — I10 Essential (primary) hypertension: Secondary | ICD-10-CM | POA: Diagnosis present

## 2013-07-27 DIAGNOSIS — Z4789 Encounter for other orthopedic aftercare: Secondary | ICD-10-CM

## 2013-07-27 DIAGNOSIS — S82143A Displaced bicondylar fracture of unspecified tibia, initial encounter for closed fracture: Secondary | ICD-10-CM | POA: Diagnosis present

## 2013-07-27 DIAGNOSIS — G4733 Obstructive sleep apnea (adult) (pediatric): Secondary | ICD-10-CM | POA: Diagnosis present
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
## 2013-07-27 MED ORDER — ONDANSETRON HCL 4 MG/2ML IJ SOLN
4.0000 mg | Freq: Once | INTRAMUSCULAR | Status: AC
  Administered 2013-07-27: 4 mg via INTRAVENOUS
  Filled 2013-07-27: qty 2

## 2013-07-27 MED ORDER — ACETAMINOPHEN 500 MG PO TABS: 1000.0000 mg | ORAL_TABLET | Freq: Once | ORAL | Status: DC

## 2013-07-27 MED ORDER — CEFAZOLIN SODIUM-DEXTROSE 2-3 GM-% IV SOLR
2.0000 g | INTRAVENOUS | Status: AC
  Administered 2013-07-28: 2 g via INTRAVENOUS

## 2013-07-27 MED ORDER — HYDROMORPHONE HCL PF 1 MG/ML IJ SOLN
1.0000 mg | Freq: Once | INTRAMUSCULAR | Status: AC
  Administered 2013-07-27: 1 mg via INTRAVENOUS
  Filled 2013-07-27: qty 1

## 2013-07-27 MED ORDER — CHLORHEXIDINE GLUCONATE CLOTH 2 % EX PADS: 6.0000 | MEDICATED_PAD | Freq: Once | CUTANEOUS | Status: DC

## 2013-07-27 MED ORDER — LACTATED RINGERS IV SOLN: INTRAVENOUS | Status: DC

## 2013-07-27 MED ORDER — CHLORHEXIDINE GLUCONATE 4 % EX LIQD: 60.0000 mL | Freq: Once | CUTANEOUS | Status: DC

## 2013-07-27 MED ORDER — HYDROMORPHONE HCL PF 1 MG/ML IJ SOLN
0.5000 mg | INTRAMUSCULAR | Status: DC | PRN
  Administered 2013-07-27 – 2013-07-28 (×3): 1 mg via INTRAVENOUS
  Filled 2013-07-27 (×3): qty 1

## 2013-07-27 NOTE — ED Notes (Signed)
Pt to xray at this time.

## 2013-07-27 NOTE — ED Notes (Signed)
Pt given something to drink.  °

## 2013-07-27 NOTE — ED Notes (Addendum)
Pt to ED via EMS after reported falling at home.  Pt was walking with walker and walker slid causing her to fall.  Pt has fx femur and is scheduled to have surgery tomorrow.  Pt c/o pain to right leg.  No bleeding noted at this time.

## 2013-07-27 NOTE — Interval H&P Note (Signed)
History and Physical Interval Note:  07/27/2013 10:22 AM  Carol Vargas  has presented today for surgery, with the diagnosis of right tibial plateau fracture  The various methods of treatment have been discussed with the patient and family. After consideration of risks, benefits and other options for treatment, the patient has consented to  Procedure(s): OPEN REDUCTION INTERNAL FIXATION (ORIF) RIGHT TIBIAL PLATEAU (Right) REMOVAL EXTERNAL FIXATION LEG (Right) as a surgical intervention .  The patient's history has been reviewed, patient examined, no change in status, stable for surgery.  I have reviewed the patient's chart and labs.  Questions were answered to the patient's satisfaction.    Pt was scheduled to have ORIF last week but surgery was cancelled due to very high BP.  She was seen and evaluated by hospitalist service and seen in follow up by PCP.  Pt cleared for ORIF.    Jari Pigg, PA-C [facility] [phone] (P)

## 2013-07-27 NOTE — ED Notes (Signed)
Pt returned from xray

## 2013-07-27 NOTE — H&P (Signed)
PREOPERATIVE H&P  Chief Complaint: Right tibia fracture, fall with uncontrolled pain  HPI: Carol Vargas is a 38 y.o. female who had a right tibial plateau fracture, underwent external fixation performed December 28, who is scheduled for conversion to plate fixation tomorrow. She fell at home tonight, and had acute onset severe pain, unable to walk, difficulty with ambulation even with her assistive devices, and came to the emergency room. She requested admission, due to the fact that she is going to be going surgery tomorrow morning, and she is not able to be controlled adequately with by mouth analgesics. She did not feel safe at home given her fall.  Past Medical History  Diagnosis Date  . Hypertension   . Headache(784.0)     at times, nothing severe per pt.   Past Surgical History  Procedure Laterality Date  . Cesarean section    . External fixation leg Right 07/05/2013    Procedure: EXTERNAL FIXATION LEG;  Surgeon: Marianna Payment, MD;  Location: [place];  Service: Orthopedics;  Laterality: Right;  . External fixation leg Right 07/07/2013    Procedure: Ex-Fix Revision Right Tibial Plateau;  Surgeon: Rozanna Box, MD;  Location: [place];  Service: Orthopedics;  Laterality: Right;  . Tonsillectomy  1992  . Dilation and curettage of uterus     History   Social History  . Marital Status: Divorced    Spouse Name: N/A    Number of Children: N/A  . Years of Education: N/A   Social History Main Topics  . Smoking status: Former Smoker -- 0.50 packs/day for 5 years    Quit date: 02/06/2013  . Smokeless tobacco: Never Used  . Alcohol Use: No  . Drug Use: No  . Sexual Activity: None   Other Topics Concern  . None   Social History Narrative  . None   Family History  Problem Relation Age of Onset  . Hypertension Mother   . Stroke Father     died from this  . Diabetes Mellitus II Mother   . Hypertension Father   . Lung cancer Maternal Aunt    No Known Allergies Prior  to Admission medications   Medication Sig Start Date End Date Taking? Authorizing Provider  acetaminophen (TYLENOL) 325 MG tablet Take 1-2 tablets (325-650 mg total) by mouth every 6 (six) hours as needed for mild pain, fever or headache. 07/10/13  Yes Jari Pigg, PA-C  cloNIDine (CATAPRES) 0.2 MG tablet Take 1 tablet (0.2 mg total) by mouth 2 (two) times daily. 07/21/13  Yes Jari Pigg, PA-C  docusate sodium (COLACE) 100 MG capsule Take 200 mg by mouth daily.   Yes Historical Provider, MD  enoxaparin (LOVENOX) 40 MG/0.4ML injection Inject 0.4 mLs (40 mg total) into the skin daily. 07/10/13  Yes Jari Pigg, PA-C  lisinopril (PRINIVIL,ZESTRIL) 10 MG tablet Take 1 tablet (10 mg total) by mouth daily. 07/10/13  Yes Adeline Saralyn Pilar, MD  methocarbamol (ROBAXIN) 500 MG tablet Take 1-2 tablets (500-1,000 mg total) by mouth every 6 (six) hours as needed for muscle spasms. 07/10/13  Yes Jari Pigg, PA-C  oxyCODONE (OXY IR/ROXICODONE) 5 MG immediate release tablet Take 1-2 tablets (5-10 mg total) by mouth every 3 (three) hours as needed for breakthrough pain (take between percocet). 07/10/13  Yes Jari Pigg, PA-C  polyethylene glycol [facility] / GLYCOLAX) packet Take 17 g by mouth daily as needed for mild constipation. 07/10/13  Yes Jari Pigg, PA-C  Positive ROS: All other systems have been reviewed and were otherwise negative with the exception of those mentioned in the HPI and as above.  Physical Exam: General: Alert, no acute distress Cardiovascular: No pedal edema, she does not have significant swelling in her right leg beyond what I would expect given her injury. Dorsalis pedis pulses intact. Respiratory: No cyanosis, no use of accessory musculature GI: No organomegaly, abdomen is soft and non-tender Skin: No lesions in the area of chief complaint, her pin sites are clean. Neurologic: Sensation intact distally throughout the right foot Psychiatric: Patient is competent for consent with normal  mood and affect Lymphatic: No axillary or cervical lymphadenopathy  MUSCULOSKELETAL: Right foot has intact EHL and FHL. She has an external fixator in place spanning the from femur across the knee with distal tibial fixation pins.  Assessment: Right bicondylar complex tibial plateau fracture, recent fall, uncontrolled pain  Plan: She is currently scheduled for surgical intervention tomorrow with Dr. handy. We will plan to admit her overnight, in order to maintain her pain, and optimize safety given her multiple recent falls. She is likely to need at least 2-3 days of inpatient hospitalization after her conversion to plate fixation, in order to monitor for neurovascular function, as well as minimize risks due to safety concerns, and we will plan to admit her tonight with IV pain medication. She'll be n.p.o. after midnight, and plan for surgery in the morning with Dr. handy.   Johnny Bridge, MD Cell [phone]   07/27/2013 9:43 PM
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
## 2013-07-27 NOTE — ED Notes (Signed)
Pain med given.  Pt waiting to go to x-ray.  Family at bedside.
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
## 2013-07-27 NOTE — H&P (View-Only) (Signed)
I have seen and examined the patient. I agree with the findings above.  Right bicondylar tibial plateau fracture with shaft extension,  Schatzker 6 No sensory or motor deficits.  Proximal tibial pin in close enough proximity to fracture to constitute infection risk; excellent alignment, but strongly recommend return to OR for revision of pin and attempt at gaining more length.  I discussed with the patient the risks and benefits of surgery, including the possibility of infection, nerve injury, vessel injury, wound breakdown, arthritis, symptomatic hardware, DVT/ PE, loss of motion, and need for further surgery among others.  We also specifically discussed the need to stage surgery because of the elevated risk of soft tissue breakdown that could lead to amputation.  She understood these risks and wished to proceed.   Rozanna Box, MD 2:37 PM

## 2013-07-28 ENCOUNTER — Inpatient Hospital Stay (HOSPITAL_COMMUNITY): Payer: Medicaid Other | Admitting: Anesthesiology

## 2013-07-28 ENCOUNTER — Encounter (HOSPITAL_COMMUNITY)
Admission: EM | Disposition: A | Discharge status: Skilled Nursing Facility | Payer: Self-pay | Source: Home / Self Care | Attending: Orthopedic Surgery

## 2013-07-28 ENCOUNTER — Inpatient Hospital Stay (HOSPITAL_COMMUNITY): Payer: Medicaid Other

## 2013-07-28 ENCOUNTER — Observation Stay (HOSPITAL_COMMUNITY): Payer: Medicaid Other

## 2013-07-28 ENCOUNTER — Encounter (HOSPITAL_COMMUNITY): Payer: Self-pay | Admitting: *Deleted

## 2013-07-28 ENCOUNTER — Inpatient Hospital Stay (HOSPITAL_COMMUNITY): Admission: RE | Admit: 2013-07-28 | Payer: Medicaid Other | Source: Ambulatory Visit | Admitting: Orthopedic Surgery

## 2013-07-28 ENCOUNTER — Encounter (HOSPITAL_COMMUNITY): Payer: Medicaid Other | Admitting: Anesthesiology

## 2013-07-28 DIAGNOSIS — S82141A Displaced bicondylar fracture of right tibia, initial encounter for closed fracture: Secondary | ICD-10-CM | POA: Diagnosis present

## 2013-07-28 HISTORY — PX: ORIF TIBIA PLATEAU: SHX2132

## 2013-07-28 HISTORY — PX: EXTERNAL FIXATION REMOVAL: SHX5040

## 2013-07-28 LAB — CREATININE, SERUM
Creatinine, Ser: 0.78 mg/dL (ref 0.50–1.10)
GFR calc Af Amer: >90 mL/min (ref >90)

## 2013-07-28 LAB — URINALYSIS, ROUTINE W REFLEX MICROSCOPIC
Bilirubin Urine: NEGATIVE
Glucose, UA: NEGATIVE mg/dL
Hgb urine dipstick: NEGATIVE
KETONES UR: NEGATIVE mg/dL
Nitrite: NEGATIVE
PROTEIN: NEGATIVE mg/dL
Specific Gravity, Urine: 1.02 (ref 1.005–1.030)
Urobilinogen, UA: 0.2 mg/dL (ref 0.0–1.0)
pH: 6 (ref 5.0–8.0)

## 2013-07-28 LAB — COMPREHENSIVE METABOLIC PANEL
ALT: 12 U/L (ref 0–35)
AST: 18 U/L (ref 0–37)
Albumin: 3.1 g/dL — ABNORMAL LOW (ref 3.5–5.2)
Alkaline Phosphatase: 104 U/L (ref 39–117)
BUN: 11 mg/dL (ref 6–23)
CO2: 28 mEq/L (ref 19–32)
CREATININE: 0.86 mg/dL (ref 0.50–1.10)
Calcium: 9.1 mg/dL (ref 8.4–10.5)
Chloride: 100 mEq/L (ref 96–112)
GFR calc non Af Amer: 84 mL/min — ABNORMAL LOW (ref >90)
GLUCOSE: 98 mg/dL (ref 70–99)
POTASSIUM: 3.9 meq/L (ref 3.7–5.3)
Sodium: 140 mEq/L (ref 137–147)
TOTAL PROTEIN: 7.2 g/dL (ref 6.0–8.3)
Total Bilirubin: 0.2 mg/dL — ABNORMAL LOW (ref 0.3–1.2)

## 2013-07-28 LAB — URINE MICROSCOPIC-ADD ON

## 2013-07-28 LAB — CBC WITH DIFFERENTIAL/PLATELET
BASOS PCT: 0 % (ref 0–1)
Basophils Absolute: 0 10*3/uL (ref 0.0–0.1)
EOS ABS: 0.1 10*3/uL (ref 0.0–0.7)
EOS PCT: 2 % (ref 0–5)
HCT: 32.7 % — ABNORMAL LOW (ref 36.0–46.0)
Hemoglobin: 10.3 g/dL — ABNORMAL LOW (ref 12.0–15.0)
Lymphocytes Relative: 26 % (ref 12–46)
Lymphs Abs: 2.1 10*3/uL (ref 0.7–4.0)
MCH: 27.9 pg (ref 26.0–34.0)
MCHC: 31.5 g/dL (ref 30.0–36.0)
MCV: 88.6 fL (ref 78.0–100.0)
MONO ABS: 0.7 10*3/uL (ref 0.1–1.0)
MONOS PCT: 8 % (ref 3–12)
NEUTROS PCT: 64 % (ref 43–77)
Neutro Abs: 5.2 10*3/uL (ref 1.7–7.7)
Platelets: 359 10*3/uL (ref 150–400)
RBC: 3.69 MIL/uL — ABNORMAL LOW (ref 3.87–5.11)
RDW: 14.1 % (ref 11.5–15.5)
WBC: 8.1 10*3/uL (ref 4.0–10.5)

## 2013-07-28 LAB — CBC
HCT: 35 % — ABNORMAL LOW (ref 36.0–46.0)
Hemoglobin: 11.3 g/dL — ABNORMAL LOW (ref 12.0–15.0)
MCH: 28.2 pg (ref 26.0–34.0)
MCHC: 32.3 g/dL (ref 30.0–36.0)
MCV: 87.3 fL (ref 78.0–100.0)
PLATELETS: 346 10*3/uL (ref 150–400)
RBC: 4.01 MIL/uL (ref 3.87–5.11)
RDW: 14 % (ref 11.5–15.5)
WBC: 13 10*3/uL — AB (ref 4.0–10.5)

## 2013-07-28 LAB — APTT: APTT: 35 s (ref 24–37)

## 2013-07-28 LAB — PROTIME-INR
INR: 1.04 (ref 0.00–1.49)
PROTHROMBIN TIME: 13.4 s (ref 11.6–15.2)

## 2013-07-28 IMAGING — RF DG KNEE 1-2V*R*
1 series · 4 of 4 positions shown · non-contrast
Comparison: Right femur and tibia/ fibula radiographs [DATE]

CLINICAL DATA: ORIF right tibial plateau fracture.

EXAM:
RIGHT KNEE - 1-2 VIEW; DG C-ARM 61-120 MIN

[Series 1: run · 4 of 4 slices shown]
[im 1/4]
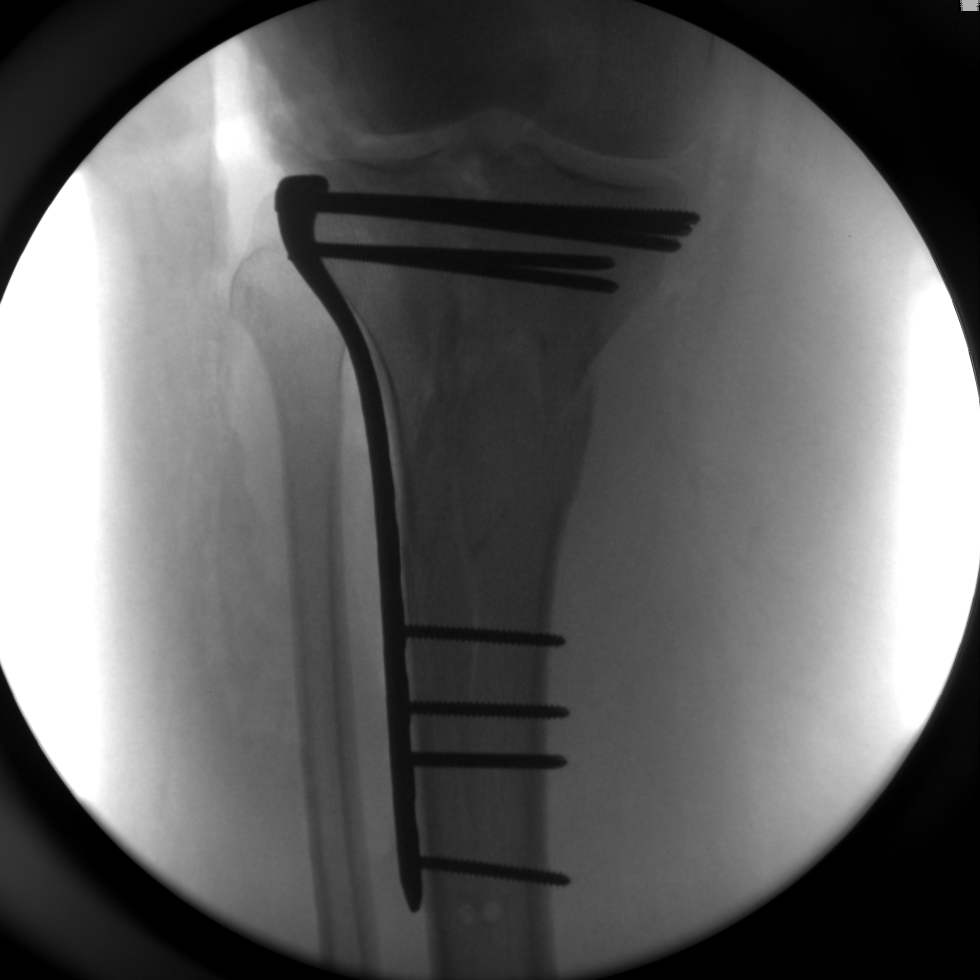
[im 2/4]
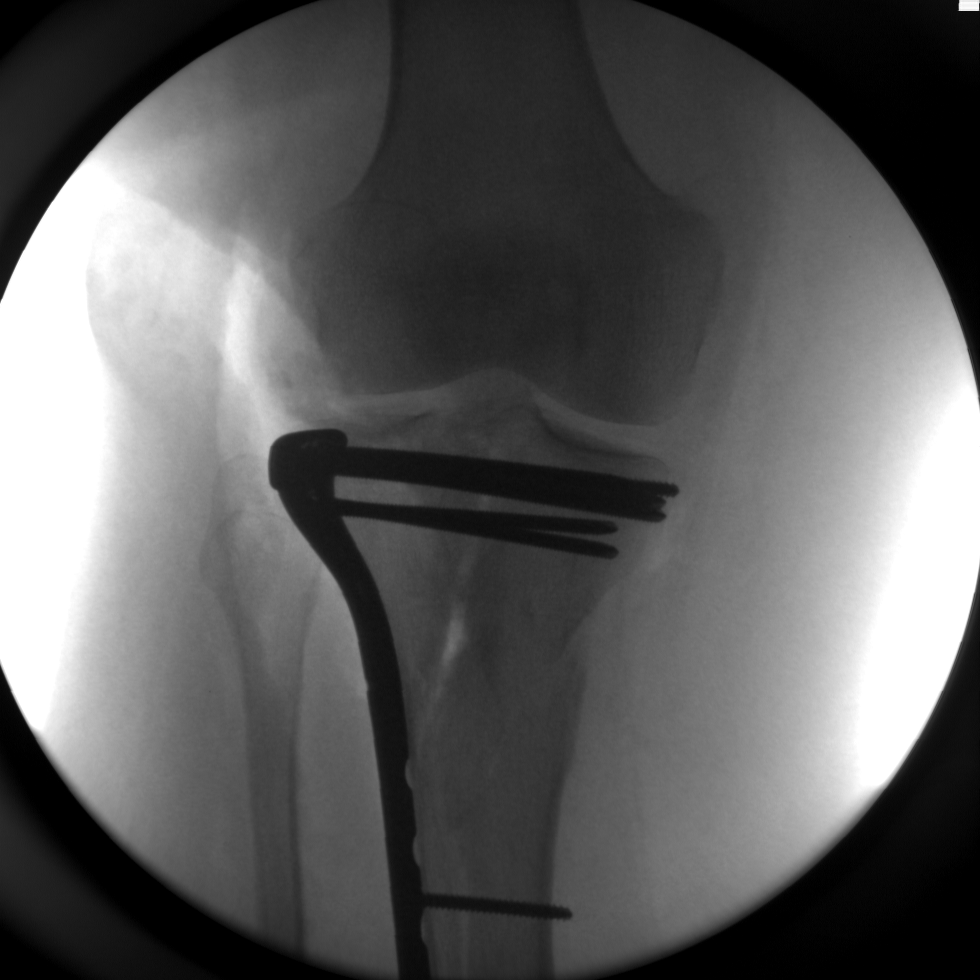
[im 3/4]
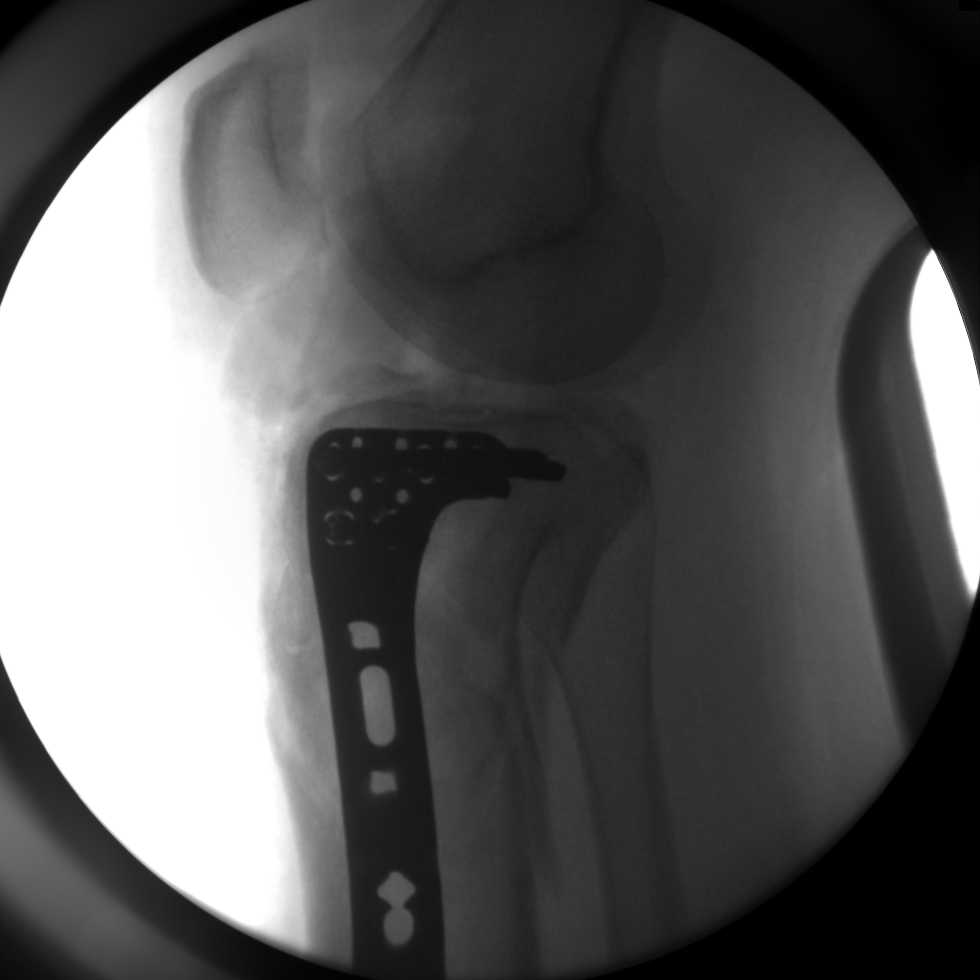
[im 4/4]
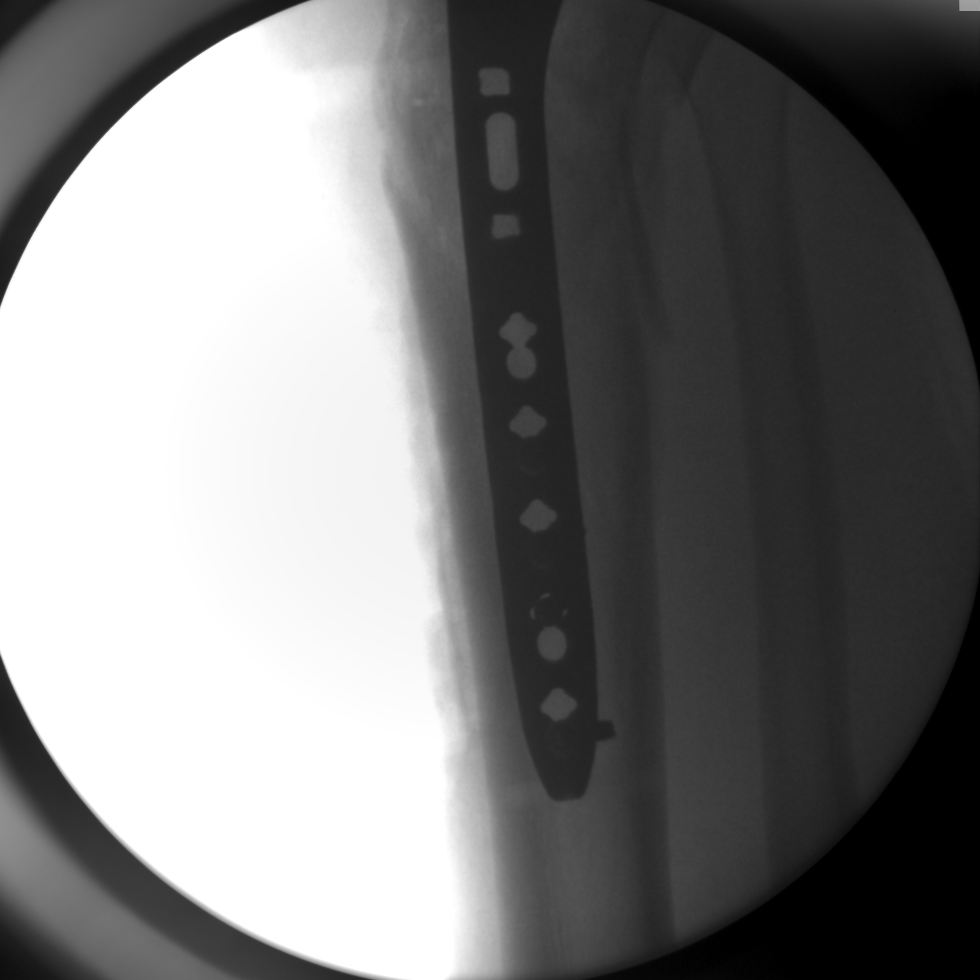

[4 of 4 positions shown; findings below may reference images not displayed]

FINDINGS: Two frontal and 2 lateral intraoperative spot fluoroscopic images of
the right knee/proximal tibia are provided. These demonstrate
interval removal of external fixation hardware and performance of
lateral plate and screw fixation of the previously described
comminuted tibial plateau fracture. The knee is approximated.
IMPRESSION: Intraoperative images during internal fixation of right tibial
plateau fracture.

## 2013-07-28 IMAGING — CR DG KNEE 1-2V PORT*R*
2 series · 2 of 2 positions shown · non-contrast
Comparison: 07/28/2013 intraoperative images an 07/27/2013 femur
and tibia/ fibula radiographs

CLINICAL DATA: Postop right tibial plateau fracture ORIF.

EXAM:
PORTABLE RIGHT KNEE - 1-2 VIEW

[AP]
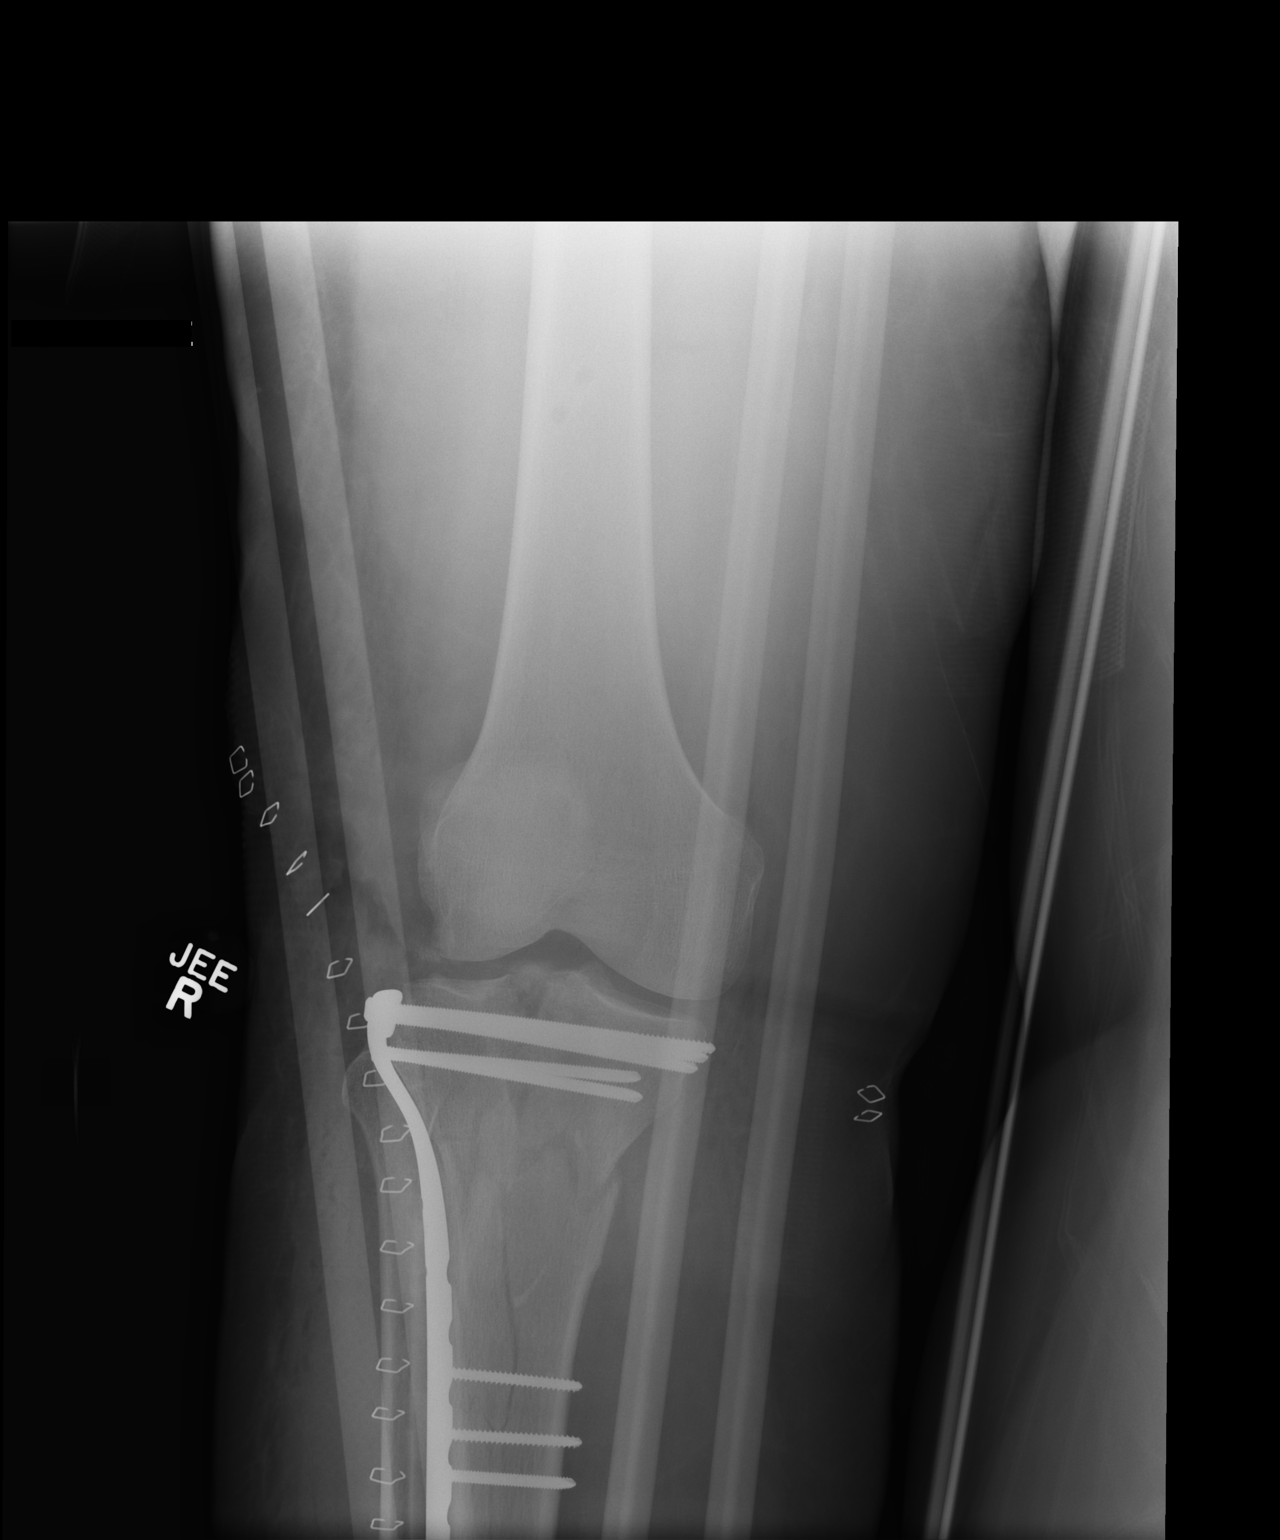

[xtable lateral]
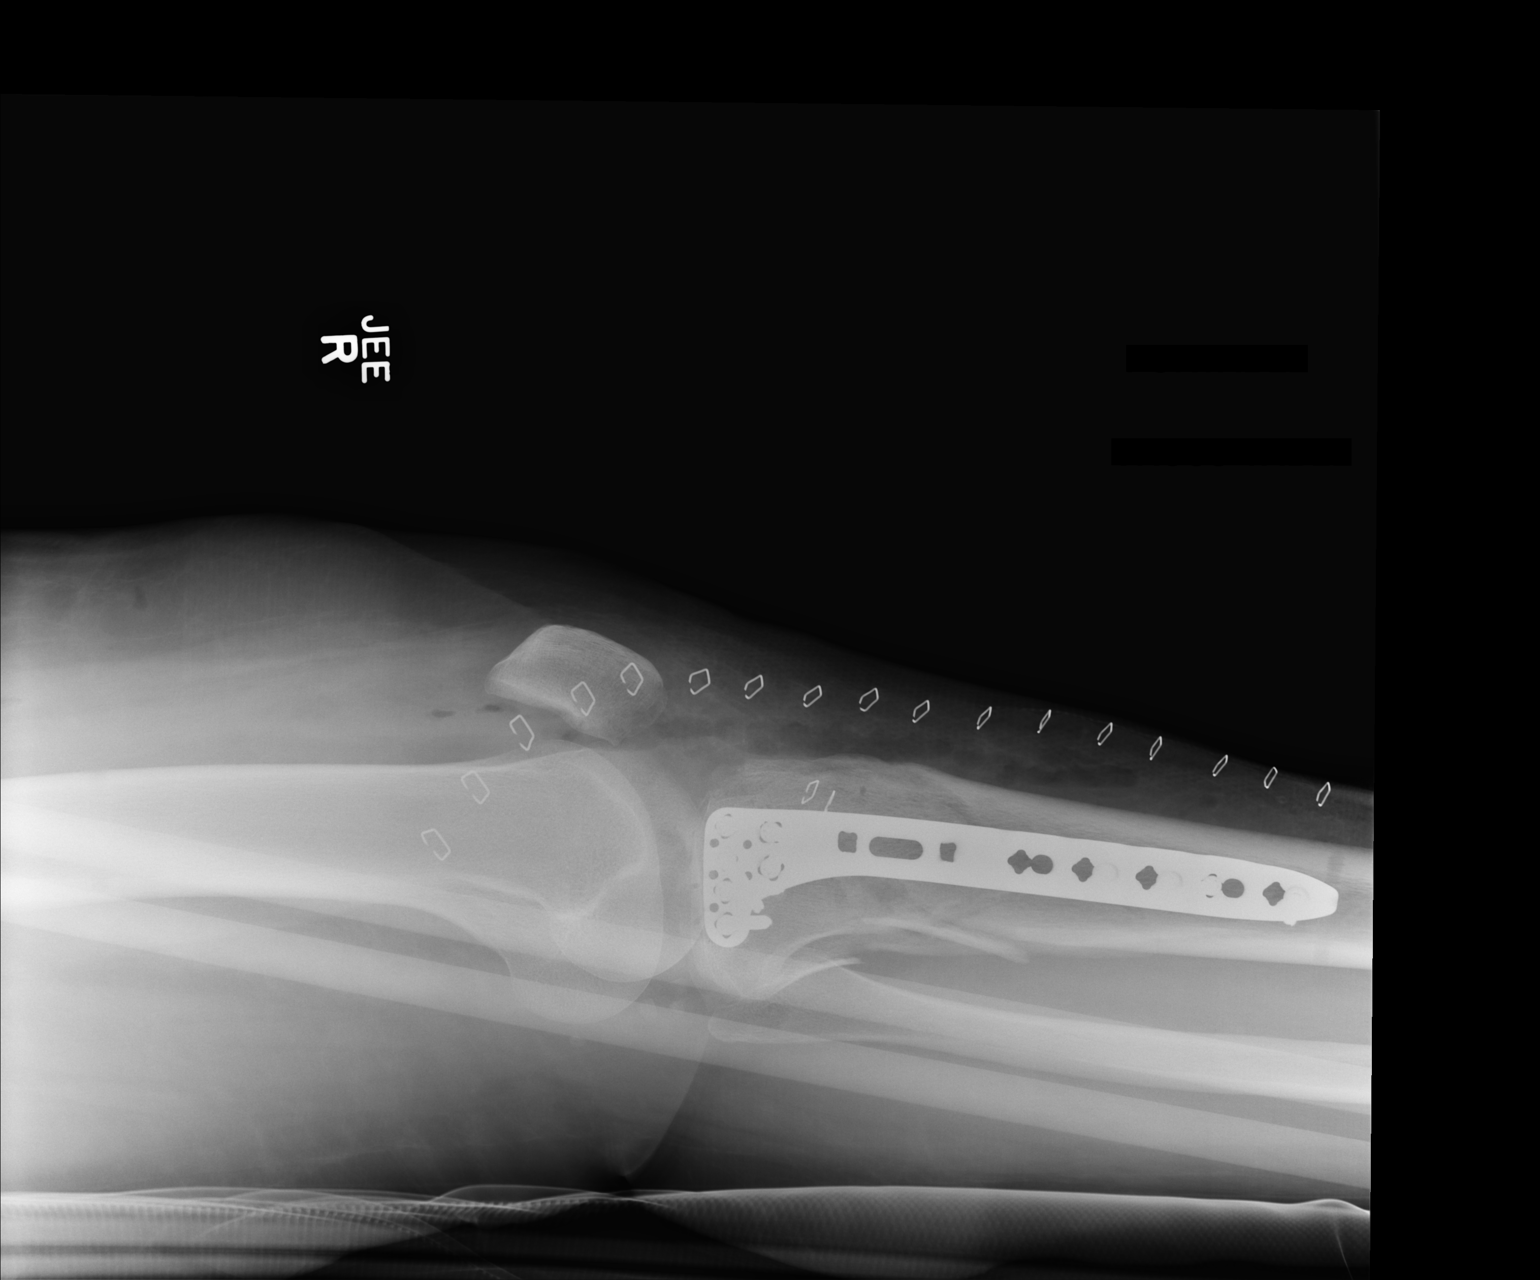

[2 of 2 positions shown; findings below may reference images not displayed]

FINDINGS: Sequelae of recent removal of external fixation hardware and
performance of lateral plate and screw fixation of a comminuted
tibial plateau fracture are identified. Overall alignment has
improved from the preoperative study, with improved approximation of
the 2 main tibial plateau fragments. There remains approximately a
1.5 cm wide articular surface discontinuity along the central aspect
of the tibial plateau. The femorotibial joint is approximated. Skin
staples and postoperative soft tissue emphysema is present. Small
suprapatellar effusion and gas is noted.
IMPRESSION: Improved alignment of tibial plateau fracture following ORIF as
above.

## 2013-07-28 IMAGING — CR DG CHEST 1V PORT
1 series · 1 of 1 positions shown · non-contrast
Comparison: PA and lateral chest x-ray July 21, 2013

CLINICAL DATA: Preoperative study prior to cesarean section.

EXAM:
PORTABLE CHEST - 1 VIEW

[AP]
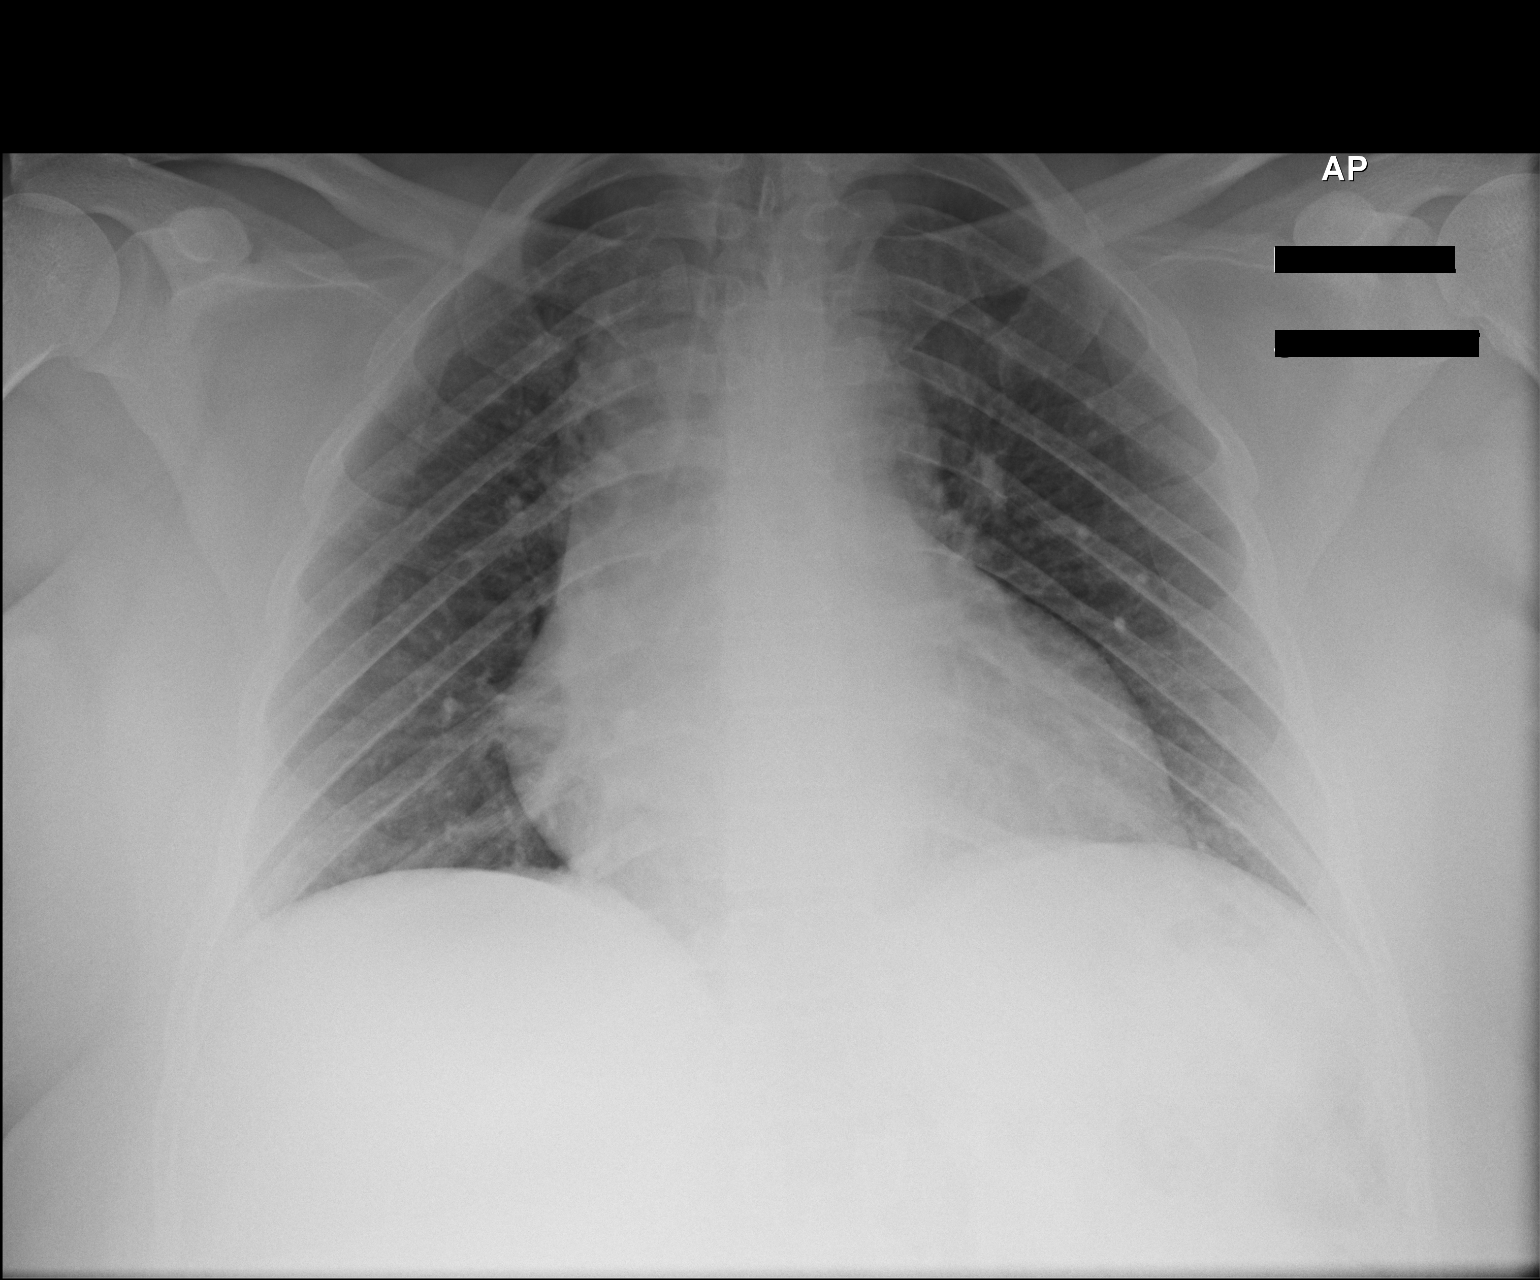

[1 of 1 positions shown; findings below may reference images not displayed]

FINDINGS: The cardiopericardial silhouette is mildly enlarged though stable.
There is no alveolar infiltrate. Interstitial markings are mildly
prominent bilaterally. The pulmonary vascularity is not engorged.
The mediastinum is normal in width. There is no pleural effusion.
The observed portions of the bony thorax appear normal.
IMPRESSION: There is mild stable enlargement of the cardiac silhouette. There is
no evidence of pulmonary edema or pneumonia nor other acute
cardiopulmonary disease.

## 2013-07-28 SURGERY — OPEN REDUCTION INTERNAL FIXATION (ORIF) TIBIAL PLATEAU
Anesthesia: General | Site: Leg Upper | Laterality: Right

## 2013-07-28 MED ORDER — GLYCOPYRROLATE 0.2 MG/ML IJ SOLN
INTRAMUSCULAR | Status: DC | PRN
  Administered 2013-07-28: .8 mg via INTRAVENOUS

## 2013-07-28 MED ORDER — 0.9 % SODIUM CHLORIDE (POUR BTL) OPTIME
TOPICAL | Status: DC | PRN
  Administered 2013-07-28: 1000 mL

## 2013-07-28 MED ORDER — CLONIDINE HCL 0.2 MG PO TABS
0.2000 mg | ORAL_TABLET | Freq: Two times a day (BID) | ORAL | Status: DC
  Administered 2013-07-28 – 2013-07-31 (×7): 0.2 mg via ORAL
  Filled 2013-07-28 (×9): qty 1

## 2013-07-28 MED ORDER — HYDROMORPHONE 0.3 MG/ML IV SOLN
INTRAVENOUS | Status: AC
Start: 2013-07-28 — End: 2013-07-28
  Administered 2013-07-28: 15:00:00
  Filled 2013-07-28: qty 25

## 2013-07-28 MED ORDER — LISINOPRIL 10 MG PO TABS
10.0000 mg | ORAL_TABLET | Freq: Every day | ORAL | Status: DC
  Administered 2013-07-28: 10 mg via ORAL
  Filled 2013-07-28: qty 1

## 2013-07-28 MED ORDER — VECURONIUM BROMIDE 10 MG IV SOLR
INTRAVENOUS | Status: DC | PRN
  Administered 2013-07-28 (×5): 2 mg via INTRAVENOUS

## 2013-07-28 MED ORDER — SODIUM CHLORIDE 0.9 % IJ SOLN: 9.0000 mL | INTRAMUSCULAR | Status: DC | PRN

## 2013-07-28 MED ORDER — METOCLOPRAMIDE HCL 5 MG PO TABS: 5.0000 mg | ORAL_TABLET | Freq: Three times a day (TID) | ORAL | Status: DC | PRN

## 2013-07-28 MED ORDER — DIPHENHYDRAMINE HCL 12.5 MG/5ML PO ELIX: 12.5000 mg | ORAL_SOLUTION | ORAL | Status: DC | PRN

## 2013-07-28 MED ORDER — METOCLOPRAMIDE HCL 5 MG PO TABS
5.0000 mg | ORAL_TABLET | Freq: Three times a day (TID) | ORAL | Status: DC | PRN
Start: 2013-07-28 — End: 2013-07-28

## 2013-07-28 MED ORDER — LABETALOL HCL 5 MG/ML IV SOLN
INTRAVENOUS | Status: DC | PRN
  Administered 2013-07-28 (×4): 5 mg via INTRAVENOUS

## 2013-07-28 MED ORDER — METOCLOPRAMIDE HCL 5 MG/ML IJ SOLN: 5.0000 mg | Freq: Three times a day (TID) | INTRAMUSCULAR | Status: DC | PRN

## 2013-07-28 MED ORDER — SENNA 8.6 MG PO TABS
1.0000 | ORAL_TABLET | Freq: Two times a day (BID) | ORAL | Status: DC
  Administered 2013-07-28 – 2013-07-31 (×6): 8.6 mg via ORAL
  Filled 2013-07-28 (×9): qty 1

## 2013-07-28 MED ORDER — PROMETHAZINE HCL 25 MG/ML IJ SOLN: 6.2500 mg | INTRAMUSCULAR | Status: DC | PRN

## 2013-07-28 MED ORDER — OXYCODONE HCL 5 MG/5ML PO SOLN: 5.0000 mg | Freq: Once | ORAL | Status: DC | PRN

## 2013-07-28 MED ORDER — ONDANSETRON HCL 4 MG/2ML IJ SOLN: 4.0000 mg | Freq: Four times a day (QID) | INTRAMUSCULAR | Status: DC | PRN

## 2013-07-28 MED ORDER — WHITE PETROLATUM GEL
Status: AC
  Filled 2013-07-28: qty 5

## 2013-07-28 MED ORDER — POLYETHYLENE GLYCOL 3350 17 G PO PACK: 17.0000 g | PACK | Freq: Every day | ORAL | Status: DC | PRN

## 2013-07-28 MED ORDER — PNEUMOCOCCAL VAC POLYVALENT 25 MCG/0.5ML IJ INJ
0.5000 mL | INJECTION | INTRAMUSCULAR | Status: AC
  Administered 2013-07-30: 0.5 mL via INTRAMUSCULAR
  Filled 2013-07-28: qty 0.5

## 2013-07-28 MED ORDER — POTASSIUM CHLORIDE IN NACL 20-0.9 MEQ/L-% IV SOLN
INTRAVENOUS | Status: DC
  Administered 2013-07-28 – 2013-07-29 (×2): via INTRAVENOUS
  Filled 2013-07-28 (×5): qty 1000

## 2013-07-28 MED ORDER — ONDANSETRON HCL 4 MG PO TABS
4.0000 mg | ORAL_TABLET | Freq: Four times a day (QID) | ORAL | Status: DC | PRN
  Administered 2013-07-31: 4 mg via ORAL
  Filled 2013-07-28: qty 1

## 2013-07-28 MED ORDER — FENTANYL CITRATE 0.05 MG/ML IJ SOLN: 50.0000 ug | Freq: Once | INTRAMUSCULAR | Status: DC

## 2013-07-28 MED ORDER — FENTANYL CITRATE 0.05 MG/ML IJ SOLN
INTRAMUSCULAR | Status: DC | PRN
  Administered 2013-07-28 (×3): 50 ug via INTRAVENOUS
  Administered 2013-07-28: 100 ug via INTRAVENOUS
  Administered 2013-07-28: 50 ug via INTRAVENOUS
  Administered 2013-07-28 (×2): 100 ug via INTRAVENOUS

## 2013-07-28 MED ORDER — MIDAZOLAM HCL 2 MG/2ML IJ SOLN: 1.0000 mg | INTRAMUSCULAR | Status: DC | PRN

## 2013-07-28 MED ORDER — POTASSIUM CHLORIDE IN NACL 20-0.45 MEQ/L-% IV SOLN
INTRAVENOUS | Status: DC
  Administered 2013-07-28: 02:00:00 via INTRAVENOUS
  Filled 2013-07-28 (×3): qty 1000

## 2013-07-28 MED ORDER — HYDROMORPHONE HCL PF 1 MG/ML IJ SOLN
INTRAMUSCULAR | Status: AC
  Filled 2013-07-28: qty 1

## 2013-07-28 MED ORDER — ZOLPIDEM TARTRATE 5 MG PO TABS
5.0000 mg | ORAL_TABLET | Freq: Every evening | ORAL | Status: DC | PRN
  Administered 2013-07-28: 5 mg via ORAL
  Filled 2013-07-28 (×2): qty 1

## 2013-07-28 MED ORDER — DOCUSATE SODIUM 100 MG PO CAPS: 200.0000 mg | ORAL_CAPSULE | Freq: Every day | ORAL | Status: DC

## 2013-07-28 MED ORDER — ONDANSETRON HCL 4 MG/2ML IJ SOLN
INTRAMUSCULAR | Status: DC | PRN
  Administered 2013-07-28: 4 mg via INTRAVENOUS

## 2013-07-28 MED ORDER — BISACODYL 10 MG RE SUPP: 10.0000 mg | Freq: Every day | RECTAL | Status: DC | PRN

## 2013-07-28 MED ORDER — NEOSTIGMINE METHYLSULFATE 1 MG/ML IJ SOLN
INTRAMUSCULAR | Status: DC | PRN
  Administered 2013-07-28: 4 mg via INTRAVENOUS

## 2013-07-28 MED ORDER — PROPOFOL 10 MG/ML IV BOLUS
INTRAVENOUS | Status: DC | PRN
  Administered 2013-07-28: 300 mg via INTRAVENOUS

## 2013-07-28 MED ORDER — CHLORHEXIDINE GLUCONATE 4 % EX LIQD
60.0000 mL | Freq: Once | CUTANEOUS | Status: AC
  Administered 2013-07-28: 4 via TOPICAL
  Filled 2013-07-28: qty 60

## 2013-07-28 MED ORDER — NALOXONE HCL 0.4 MG/ML IJ SOLN
0.4000 mg | INTRAMUSCULAR | Status: DC | PRN
  Filled 2013-07-28: qty 1

## 2013-07-28 MED ORDER — LIDOCAINE HCL (CARDIAC) 20 MG/ML IV SOLN
INTRAVENOUS | Status: DC | PRN
  Administered 2013-07-28: 100 mg via INTRAVENOUS

## 2013-07-28 MED ORDER — HYDROMORPHONE HCL PF 1 MG/ML IJ SOLN
0.2500 mg | INTRAMUSCULAR | Status: DC | PRN
  Administered 2013-07-28 (×4): 0.5 mg via INTRAVENOUS

## 2013-07-28 MED ORDER — ONDANSETRON HCL 4 MG PO TABS: 4.0000 mg | ORAL_TABLET | Freq: Four times a day (QID) | ORAL | Status: DC | PRN

## 2013-07-28 MED ORDER — CEFAZOLIN SODIUM-DEXTROSE 2-3 GM-% IV SOLR
2.0000 g | INTRAVENOUS | Status: DC
  Filled 2013-07-28: qty 50

## 2013-07-28 MED ORDER — POLYETHYLENE GLYCOL 3350 17 G PO PACK
17.0000 g | PACK | Freq: Every day | ORAL | Status: DC
  Administered 2013-07-29 – 2013-07-31 (×3): 17 g via ORAL
  Filled 2013-07-28 (×3): qty 1

## 2013-07-28 MED ORDER — OXYCODONE HCL 5 MG PO TABS
5.0000 mg | ORAL_TABLET | ORAL | Status: DC | PRN
  Administered 2013-07-29 – 2013-07-30 (×5): 10 mg via ORAL
  Filled 2013-07-28 (×5): qty 2

## 2013-07-28 MED ORDER — OXYCODONE-ACETAMINOPHEN 5-325 MG PO TABS
1.0000 | ORAL_TABLET | ORAL | Status: DC | PRN
  Administered 2013-07-28 – 2013-07-31 (×8): 2 via ORAL
  Filled 2013-07-28 (×2): qty 2
  Filled 2013-07-28: qty 1
  Filled 2013-07-28 (×5): qty 2
  Filled 2013-07-28: qty 1
  Filled 2013-07-28: qty 2

## 2013-07-28 MED ORDER — ONDANSETRON HCL 4 MG/2ML IJ SOLN
4.0000 mg | Freq: Four times a day (QID) | INTRAMUSCULAR | Status: DC | PRN
Start: 2013-07-28 — End: 2013-07-28
  Filled 2013-07-28: qty 2

## 2013-07-28 MED ORDER — MAGNESIUM CITRATE PO SOLN
1.0000 | Freq: Once | ORAL | Status: AC | PRN
  Filled 2013-07-28 (×2): qty 296

## 2013-07-28 MED ORDER — MIDAZOLAM HCL 5 MG/5ML IJ SOLN
INTRAMUSCULAR | Status: DC | PRN
  Administered 2013-07-28: 2 mg via INTRAVENOUS

## 2013-07-28 MED ORDER — CHLORHEXIDINE GLUCONATE CLOTH 2 % EX PADS
6.0000 | MEDICATED_PAD | Freq: Once | CUTANEOUS | Status: AC
  Administered 2013-07-28: 6 via TOPICAL

## 2013-07-28 MED ORDER — OXYCODONE HCL 5 MG PO TABS: 5.0000 mg | ORAL_TABLET | Freq: Once | ORAL | Status: DC | PRN

## 2013-07-28 MED ORDER — DOCUSATE SODIUM 100 MG PO CAPS
100.0000 mg | ORAL_CAPSULE | Freq: Two times a day (BID) | ORAL | Status: DC
  Administered 2013-07-28 – 2013-07-31 (×6): 100 mg via ORAL
  Filled 2013-07-28 (×6): qty 1

## 2013-07-28 MED ORDER — ENOXAPARIN SODIUM 40 MG/0.4ML ~~LOC~~ SOLN
40.0000 mg | Freq: Every day | SUBCUTANEOUS | Status: DC
  Administered 2013-07-28 – 2013-07-30 (×3): 40 mg via SUBCUTANEOUS
  Filled 2013-07-28 (×3): qty 0.4

## 2013-07-28 MED ORDER — DIPHENHYDRAMINE HCL 50 MG/ML IJ SOLN
12.5000 mg | Freq: Four times a day (QID) | INTRAMUSCULAR | Status: DC | PRN
  Filled 2013-07-28: qty 0.25

## 2013-07-28 MED ORDER — LACTATED RINGERS IV SOLN
INTRAVENOUS | Status: DC
  Administered 2013-07-28: 11:00:00 via INTRAVENOUS

## 2013-07-28 MED ORDER — LACTATED RINGERS IV SOLN
INTRAVENOUS | Status: DC | PRN
  Administered 2013-07-28 (×2): via INTRAVENOUS

## 2013-07-28 MED ORDER — HYDROMORPHONE 0.3 MG/ML IV SOLN
INTRAVENOUS | Status: DC
  Administered 2013-07-28: 4.5 mg via INTRAVENOUS
  Administered 2013-07-29: 0.3 mg via INTRAVENOUS
  Administered 2013-07-29: 2.1 mg via INTRAVENOUS
  Administered 2013-07-29: 11:00:00 via INTRAVENOUS
  Administered 2013-07-29: 1.2 mg via INTRAVENOUS
  Administered 2013-07-29: 6.6 mg via INTRAVENOUS
  Administered 2013-07-29 (×2): 0.3 mg via INTRAVENOUS
  Administered 2013-07-30: 3 mg via INTRAVENOUS
  Administered 2013-07-30: 0.9 mg via INTRAVENOUS
  Administered 2013-07-30: 1.8 mg via INTRAVENOUS
  Filled 2013-07-28 (×3): qty 25

## 2013-07-28 MED ORDER — METHOCARBAMOL 500 MG PO TABS
500.0000 mg | ORAL_TABLET | Freq: Four times a day (QID) | ORAL | Status: DC | PRN
  Administered 2013-07-28 – 2013-07-31 (×11): 1000 mg via ORAL
  Filled 2013-07-28 (×11): qty 2

## 2013-07-28 MED ORDER — CHLORHEXIDINE GLUCONATE CLOTH 2 % EX PADS
6.0000 | MEDICATED_PAD | Freq: Once | CUTANEOUS | Status: AC
  Administered 2013-07-28: 6 via TOPICAL
  Filled 2013-07-28: qty 6

## 2013-07-28 MED ORDER — DIPHENHYDRAMINE HCL 12.5 MG/5ML PO ELIX
12.5000 mg | ORAL_SOLUTION | Freq: Four times a day (QID) | ORAL | Status: DC | PRN
  Filled 2013-07-28: qty 5

## 2013-07-28 MED ORDER — HYDROMORPHONE HCL PF 1 MG/ML IJ SOLN
INTRAMUSCULAR | Status: AC
Start: 2013-07-28 — End: 2013-07-28
  Filled 2013-07-28: qty 1

## 2013-07-28 MED ORDER — CEFAZOLIN SODIUM 1-5 GM-% IV SOLN
1.0000 g | Freq: Four times a day (QID) | INTRAVENOUS | Status: AC
  Administered 2013-07-28 – 2013-07-29 (×3): 1 g via INTRAVENOUS
  Filled 2013-07-28 (×3): qty 50

## 2013-07-28 MED ORDER — ROCURONIUM BROMIDE 100 MG/10ML IV SOLN
INTRAVENOUS | Status: DC | PRN
  Administered 2013-07-28: 50 mg via INTRAVENOUS

## 2013-07-28 SURGICAL SUPPLY — 114 items
1.6 K-WIRES ×9 IMPLANT
ANCH SUT 2 CRKSCW 12X3.5 EYLT (Anchor) IMPLANT
ANCH SUT 2 SUTTK 14.5X3 BABSR (Orthopedic Implant) ×1 IMPLANT
ANCHOR BIO-SUTURETAK 3X14.5 (Orthopedic Implant) ×3 IMPLANT
ANCHOR CORKSCREW 3.5 FIBERWIRE (Anchor) IMPLANT
BANDAGE ELASTIC 4 VELCRO ST LF (GAUZE/BANDAGES/DRESSINGS) ×3 IMPLANT
BANDAGE ELASTIC 6 VELCRO ST LF (GAUZE/BANDAGES/DRESSINGS) IMPLANT
BANDAGE ESMARK 6X9 LF (GAUZE/BANDAGES/DRESSINGS) ×2 IMPLANT
BANDAGE GAUZE ELAST BULKY 4 IN (GAUZE/BANDAGES/DRESSINGS) ×3 IMPLANT
BIT DRILL 2.5X110 QC LCP DISP (BIT) ×3 IMPLANT
BIT DRILL PERC QC 2.8X200 100 (BIT) ×2 IMPLANT
BIT DRILL QC 3.5X110 (BIT) ×3 IMPLANT
BLADE SURG 10 STRL SS (BLADE) ×3 IMPLANT
BLADE SURG 15 STRL LF DISP TIS (BLADE) IMPLANT
BLADE SURG 15 STRL SS (BLADE)
BLADE SURG ROTATE 9660 (MISCELLANEOUS) IMPLANT
BNDG CMPR 9X6 STRL LF SNTH (GAUZE/BANDAGES/DRESSINGS) ×2
BNDG CMPR MED 10X6 ELC LF (GAUZE/BANDAGES/DRESSINGS) ×1
BNDG COHESIVE 4X5 TAN STRL (GAUZE/BANDAGES/DRESSINGS) IMPLANT
BNDG COHESIVE 6X5 TAN STRL LF (GAUZE/BANDAGES/DRESSINGS) ×3 IMPLANT
BNDG ELASTIC 6X10 VLCR STRL LF (GAUZE/BANDAGES/DRESSINGS) ×3 IMPLANT
BNDG ESMARK 6X9 LF (GAUZE/BANDAGES/DRESSINGS) ×3
BRUSH SCRUB DISP (MISCELLANEOUS) ×6 IMPLANT
CLEANER TIP ELECTROSURG 2X2 (MISCELLANEOUS) IMPLANT
CLOTH BEACON ORANGE TIMEOUT ST (SAFETY) IMPLANT
COVER MAYO STAND STRL (DRAPES) IMPLANT
COVER SURGICAL LIGHT HANDLE (MISCELLANEOUS) ×3 IMPLANT
CUFF TOURNIQUET SINGLE 18IN (TOURNIQUET CUFF) IMPLANT
CUFF TOURNIQUET SINGLE 24IN (TOURNIQUET CUFF) IMPLANT
CUFF TOURNIQUET SINGLE 34IN LL (TOURNIQUET CUFF) IMPLANT
CUFF TOURNIQUET SINGLE 44IN (TOURNIQUET CUFF) ×3 IMPLANT
DRAPE C-ARM 42X72 X-RAY (DRAPES) ×3 IMPLANT
DRAPE C-ARMOR (DRAPES) ×3 IMPLANT
DRAPE INCISE IOBAN 66X45 STRL (DRAPES) ×6 IMPLANT
DRAPE OEC MINIVIEW 54X84 (DRAPES) IMPLANT
DRAPE ORTHO SPLIT 77X108 STRL (DRAPES) ×4
DRAPE PROXIMA HALF (DRAPES) ×6 IMPLANT
DRAPE SURG ORHT 6 SPLT 77X108 (DRAPES) ×4 IMPLANT
DRAPE U-SHAPE 47X51 STRL (DRAPES) ×3 IMPLANT
DRILL BIT QUICK COUP 2.8MM 100 (BIT) ×1
DRSG ADAPTIC 3X8 NADH LF (GAUZE/BANDAGES/DRESSINGS) IMPLANT
DRSG MEPITEL 4X7.2 (GAUZE/BANDAGES/DRESSINGS) ×3 IMPLANT
DRSG PAD ABDOMINAL 8X10 ST (GAUZE/BANDAGES/DRESSINGS) ×3 IMPLANT
ELECT CAUTERY BLADE 6.4 (BLADE) ×3 IMPLANT
ELECT REM PT RETURN 9FT ADLT (ELECTROSURGICAL) ×3
ELECTRODE REM PT RTRN 9FT ADLT (ELECTROSURGICAL) ×2 IMPLANT
EVACUATOR 1/8 PVC DRAIN (DRAIN) IMPLANT
EVACUATOR 3/16  PVC DRAIN (DRAIN)
EVACUATOR 3/16 PVC DRAIN (DRAIN) IMPLANT
GLOVE BIO SURGEON STRL SZ7 (GLOVE) ×15 IMPLANT
GLOVE BIO SURGEON STRL SZ7.5 (GLOVE) ×3 IMPLANT
GLOVE BIO SURGEON STRL SZ8 (GLOVE) ×6 IMPLANT
GLOVE BIO SURGEON STRL SZ8.5 (GLOVE) ×3 IMPLANT
GLOVE BIOGEL PI IND STRL 6.5 (GLOVE) ×2 IMPLANT
GLOVE BIOGEL PI IND STRL 7.0 (GLOVE) ×6 IMPLANT
GLOVE BIOGEL PI IND STRL 7.5 (GLOVE) ×2 IMPLANT
GLOVE BIOGEL PI IND STRL 8 (GLOVE) ×2 IMPLANT
GLOVE BIOGEL PI INDICATOR 6.5 (GLOVE) ×1
GLOVE BIOGEL PI INDICATOR 7.0 (GLOVE) ×3
GLOVE BIOGEL PI INDICATOR 7.5 (GLOVE) ×1
GLOVE BIOGEL PI INDICATOR 8 (GLOVE) ×1
GOWN PREVENTION PLUS XLARGE (GOWN DISPOSABLE) ×9 IMPLANT
GOWN STRL NON-REIN LRG LVL3 (GOWN DISPOSABLE) ×6 IMPLANT
IMMOBILIZER KNEE 22 UNIV (SOFTGOODS) ×3 IMPLANT
KIT BASIN OR (CUSTOM PROCEDURE TRAY) ×3 IMPLANT
KIT ROOM TURNOVER OR (KITS) ×3 IMPLANT
MANIFOLD NEPTUNE II (INSTRUMENTS) IMPLANT
NDL SUT 6 .5 CRC .975X.05 MAYO (NEEDLE) ×2 IMPLANT
NEEDLE 22X1 1/2 (OR ONLY) (NEEDLE) IMPLANT
NEEDLE MAYO TAPER (NEEDLE) ×3
NS IRRIG 1000ML POUR BTL (IV SOLUTION) ×3 IMPLANT
PACK ORTHO EXTREMITY (CUSTOM PROCEDURE TRAY) ×3 IMPLANT
PAD ARMBOARD 7.5X6 YLW CONV (MISCELLANEOUS) ×3 IMPLANT
PAD CAST 4YDX4 CTTN HI CHSV (CAST SUPPLIES) IMPLANT
PADDING CAST COTTON 4X4 STRL (CAST SUPPLIES)
PADDING CAST COTTON 6X4 STRL (CAST SUPPLIES) IMPLANT
PENCIL BUTTON HOLSTER BLD 10FT (ELECTRODE) ×3 IMPLANT
PLATE PROX TIBIA VA LCP 3.5MM (Plate) ×3 IMPLANT
SCREW CORTEX 3.5 34MM (Screw) ×1 IMPLANT
SCREW CORTEX 3.5 36MM (Screw) ×2 IMPLANT
SCREW LOCK CORT ST 3.5X34 (Screw) ×2 IMPLANT
SCREW LOCK CORT ST 3.5X36 (Screw) ×4 IMPLANT
SCREW LOCKING 3.5X80MM VA (Screw) ×6 IMPLANT
SCREW LOCKING VA 3.5X34MM (Screw) ×3 IMPLANT
SCREW PELVIC CORT ST 3.5X85 (Screw) ×2 IMPLANT
SCREW SELF TAP 3.5 85M (Screw) ×1 IMPLANT
SCREW VA-LOCKING 65MM 3.5 (Screw) ×9 IMPLANT
SPONGE GAUZE 4X4 12PLY (GAUZE/BANDAGES/DRESSINGS) ×3 IMPLANT
SPONGE LAP 18X18 X RAY DECT (DISPOSABLE) IMPLANT
SPONGE SCRUB IODOPHOR (GAUZE/BANDAGES/DRESSINGS) ×3 IMPLANT
STAPLER VISISTAT 35W (STAPLE) ×3 IMPLANT
STOCKINETTE IMPERVIOUS LG (DRAPES) ×3 IMPLANT
STRIP CLOSURE SKIN 1/2X4 (GAUZE/BANDAGES/DRESSINGS) IMPLANT
SUCTION FRAZIER TIP 10 FR DISP (SUCTIONS) ×3 IMPLANT
SUT ETHILON 3 0 PS 1 (SUTURE) IMPLANT
SUT PDS AB 2-0 CT1 27 (SUTURE) IMPLANT
SUT PROLENE 0 CT 2 (SUTURE) ×6 IMPLANT
SUT VIC AB 0 CT1 27 (SUTURE) ×6
SUT VIC AB 0 CT1 27XBRD ANBCTR (SUTURE) ×4 IMPLANT
SUT VIC AB 1 CT1 27 (SUTURE) ×4
SUT VIC AB 1 CT1 27XBRD ANBCTR (SUTURE) ×4 IMPLANT
SUT VIC AB 2-0 CT1 27 (SUTURE) ×4
SUT VIC AB 2-0 CT1 TAPERPNT 27 (SUTURE) ×4 IMPLANT
SYR 20ML ECCENTRIC (SYRINGE) IMPLANT
SYR BULB 3OZ (MISCELLANEOUS) ×3 IMPLANT
SYR CONTROL 10ML LL (SYRINGE) IMPLANT
TOWEL OR 17X24 6PK STRL BLUE (TOWEL DISPOSABLE) ×3 IMPLANT
TOWEL OR 17X26 10 PK STRL BLUE (TOWEL DISPOSABLE) ×6 IMPLANT
TRAY FOLEY CATH 14FR (SET/KITS/TRAYS/PACK) ×3 IMPLANT
TRAY FOLEY CATH 16FRSI W/METER (SET/KITS/TRAYS/PACK) IMPLANT
TUBE CONNECTING 12X1/4 (SUCTIONS) ×3 IMPLANT
UNDERPAD 30X30 INCONTINENT (UNDERPADS AND DIAPERS) ×3 IMPLANT
WATER STERILE IRR 1000ML POUR (IV SOLUTION) IMPLANT
YANKAUER SUCT BULB TIP NO VENT (SUCTIONS) ×3 IMPLANT

## 2013-07-28 NOTE — Progress Notes (Signed)
I have seen and examined the patient. I have no additions to Dr. Luanna Cole note and findings above. Ms. Folden is well known to the Orthopaedic Trauma Service and has been scheduled for surgical repair of her right tibial plateau.  I have discussed with the patient the risks and benefits of surgery, including the possibility of infection, nerve injury, vessel injury, wound breakdown, arthritis, symptomatic hardware, DVT/ PE, loss of motion, and need for further surgery among others.  She understood these risks and wished to proceed.   Rozanna Box, MD 07/28/2013 10:46 AM

## 2013-07-28 NOTE — ED Notes (Signed)
Pt updated again on bed assignment.  Pain med given for pain in right leg.  Warm blanket given and lights turned down.

## 2013-07-28 NOTE — Anesthesia Preprocedure Evaluation (Addendum)
Anesthesia Evaluation  Patient identified by MRN, date of birth, ID band Patient awake    Reviewed: Allergy & Precautions, H&P , NPO status , Patient's Chart, lab work & pertinent test results  Airway Mallampati: II TM Distance: >3 FB Neck ROM: Full    Dental  (+) Teeth Intact and Dental Advisory Given   Pulmonary sleep apnea , former smoker,  breath sounds clear to auscultation        Cardiovascular hypertension, Pt. on medications Rhythm:Regular Rate:Normal     Neuro/Psych  Headaches,    GI/Hepatic   Endo/Other  Morbid obesity  Renal/GU      Musculoskeletal   Abdominal (+) + obese,   Peds  Hematology   Anesthesia Other Findings   Reproductive/Obstetrics                         Anesthesia Physical Anesthesia Plan  ASA: III  Anesthesia Plan: General   Post-op Pain Management:    Induction: Intravenous  Airway Management Planned: Oral ETT  Additional Equipment:   Intra-op Plan:   Post-operative Plan: Extubation in OR  Informed Consent: I have reviewed the patients History and Physical, chart, labs and discussed the procedure including the risks, benefits and alternatives for the proposed anesthesia with the patient or authorized representative who has indicated his/her understanding and acceptance.     Plan Discussed with: CRNA and Surgeon  Anesthesia Plan Comments:         Anesthesia Quick Evaluation

## 2013-07-28 NOTE — Anesthesia Procedure Notes (Signed)
Procedure Name: Intubation Date/Time: 07/28/2013 11:03 AM Performed by: Rush Farmer E Pre-anesthesia Checklist: Patient identified, Emergency Drugs available, Suction available, Patient being monitored and Timeout performed Patient Re-evaluated:Patient Re-evaluated prior to inductionOxygen Delivery Method: Circle system utilized Preoxygenation: Pre-oxygenation with 100% oxygen Intubation Type: IV induction Ventilation: Mask ventilation without difficulty Laryngoscope Size: Mac and 3 Grade View: Grade I Tube type: Oral Tube size: 7.0 mm Number of attempts: 1 Airway Equipment and Method: Stylet Placement Confirmation: ETT inserted through vocal cords under direct vision,  positive ETCO2 and breath sounds checked- equal and bilateral Secured at: 22 cm Tube secured with: Tape Dental Injury: Teeth and Oropharynx as per pre-operative assessment

## 2013-07-28 NOTE — Anesthesia Postprocedure Evaluation (Signed)
  Anesthesia Post-op Note  Patient: Carol Vargas  Procedure(s) Performed: Procedure(s): OPEN REDUCTION INTERNAL FIXATION (ORIF) RIGHT TIBIAL PLATEAU (Right) REMOVAL EXTERNAL FIXATION LEG (Right)  Patient Location: PACU  Anesthesia Type:General  Level of Consciousness: awake and alert   Airway and Oxygen Therapy: Patient Spontanous Breathing  Post-op Pain: mild  Post-op Assessment: Post-op Vital signs reviewed, Patient's Cardiovascular Status Stable, Respiratory Function Stable, Patent Airway, No signs of Nausea or vomiting and Pain level controlled  Post-op Vital Signs: Reviewed and stable  Complications: No apparent anesthesia complications

## 2013-07-28 NOTE — Preoperative (Signed)
Beta Blockers   Reason not to administer Beta Blockers:Not Applicable 

## 2013-07-28 NOTE — Brief Op Note (Signed)
07/27/2013 - 07/28/2013  2:18 PM  PATIENT:  Carol Vargas  38 y.o. female  PRE-OPERATIVE DIAGNOSIS:  right tibial plateau fracture, bicondylar and tibial shaft (Schatzker 6); retained ex-fix; ulcerated pin sites  POST-OPERATIVE DIAGNOSIS:  right tibial plateau fracture, bicondylar and tibial shaft (Schatzker 6); retained ex-fix; avulsed lateral meniscus; ulcerated pin sites  PROCEDURE:  Procedure(s): 1. OPEN REDUCTION INTERNAL FIXATION (ORIF) RIGHT TIBIAL PLATEAU (Right), bicondylar 2. ORIF tibial shaft 3. Arthrotomy with repair of lateral meniscus 4. Anterior compartment fasciotomy 5. Removal of external fixator under anesthesia 6. Debridement of ulcerated pin sites femur and tibia  SURGEON:  Surgeon(s) and Role:    * Rozanna Box, MD - Primary  PHYSICIAN ASSISTANT: Ainsley Spinner, PA-C  ANESTHESIA:   general  I/O:  Total I/O In: 1000 [I.V.:1000] Out: 850 [Urine:750; Blood:100]  SPECIMEN:  No Specimen  TOURNIQUET:   Total Tourniquet Time Documented: Thigh (Right) - 119 minutes Total: Thigh (Right) - 119 minutes   DICTATION: .Other Dictation: Dictation Number [id number]

## 2013-07-28 NOTE — Progress Notes (Signed)
0230 Patient states she is going for surgery in am and does not want to be awaken for q1h vital signs.

## 2013-07-28 NOTE — Transfer of Care (Signed)
Immediate Anesthesia Transfer of Care Note  Patient: Carol Vargas  Procedure(s) Performed: Procedure(s): OPEN REDUCTION INTERNAL FIXATION (ORIF) RIGHT TIBIAL PLATEAU (Right) REMOVAL EXTERNAL FIXATION LEG (Right)  Patient Location: PACU  Anesthesia Type:General  Level of Consciousness: awake, alert  and oriented  Airway & Oxygen Therapy: Patient Spontanous Breathing and Patient connected to nasal cannula oxygen  Post-op Assessment: Report given to PACU RN, Post -op Vital signs reviewed and stable and Patient moving all extremities X 4  Post vital signs: Reviewed and stable  Complications: No apparent anesthesia complications

## 2013-07-29 LAB — BASIC METABOLIC PANEL
BUN: 9 mg/dL (ref 6–23)
CALCIUM: 9 mg/dL (ref 8.4–10.5)
CO2: 25 meq/L (ref 19–32)
CREATININE: 0.78 mg/dL (ref 0.50–1.10)
Chloride: 99 mEq/L (ref 96–112)
GFR calc Af Amer: >90 mL/min (ref >90)
GFR calc non Af Amer: >90 mL/min (ref >90)
Glucose, Bld: 105 mg/dL — ABNORMAL HIGH (ref 70–99)
Potassium: 4.3 mEq/L (ref 3.7–5.3)
Sodium: 139 mEq/L (ref 137–147)
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
## 2013-07-29 NOTE — Progress Notes (Signed)
Orthopedic Tech Progress Note Patient Details:  Carol Vargas Jun 09, 1976 [id number]  Patient ID: Carol Vargas, female   DOB: Jan 23, 1976, 38 y.o.   MRN: [id number]   Carol Vargas, Carol Vargas 07/29/2013, 9:58 AMTrapeze bar

## 2013-07-29 NOTE — Progress Notes (Signed)
PT Cancellation Note  Patient Details Name: Jacilyn Sanpedro MRN: [id number] DOB: 1975/11/10   Cancelled Treatment:    Reason Eval/Treat Not Completed: Pain limiting ability to participate.  Will f/u another time.     Finnleigh Marchetti, Thornton Papas 07/29/2013, 11:05 AM

## 2013-07-29 NOTE — Progress Notes (Signed)
OT Cancellation Note  Patient Details Name: Carol Vargas MRN: [id number] DOB: [birth year]   Cancelled Treatment:    Reason Eval/Treat Not Completed: Pain limiting ability to participate  Peri Maris 07/29/2013, 3:36 PM

## 2013-07-30 LAB — BASIC METABOLIC PANEL
BUN: 10 mg/dL (ref 6–23)
CALCIUM: 8.7 mg/dL (ref 8.4–10.5)
CO2: 26 mEq/L (ref 19–32)
CREATININE: 0.84 mg/dL (ref 0.50–1.10)
Chloride: 99 mEq/L (ref 96–112)
GFR calc Af Amer: >90 mL/min (ref >90)
GFR calc non Af Amer: 86 mL/min — ABNORMAL LOW (ref >90)
GLUCOSE: 91 mg/dL (ref 70–99)
Potassium: 4.2 mEq/L (ref 3.7–5.3)
Sodium: 137 mEq/L (ref 137–147)

## 2013-07-30 MED ORDER — HYDROMORPHONE HCL PF 1 MG/ML IJ SOLN
0.5000 mg | INTRAMUSCULAR | Status: DC | PRN
  Administered 2013-07-30 – 2013-07-31 (×2): 0.5 mg via INTRAVENOUS
  Filled 2013-07-30 (×2): qty 1

## 2013-07-30 MED ORDER — OXYCODONE HCL 5 MG PO TABS
5.0000 mg | ORAL_TABLET | ORAL | Status: DC | PRN
  Administered 2013-07-30: 10 mg via ORAL
  Administered 2013-07-31 (×2): 15 mg via ORAL
  Filled 2013-07-30: qty 3
  Filled 2013-07-30: qty 2
  Filled 2013-07-30: qty 3

## 2013-07-30 NOTE — Progress Notes (Signed)
Orthopedic Tech Progress Note Patient Details:  Carol Vargas 09/21/1975 [id number] Brace completed by [facility]  Patient ID: Carol Vargas, female   DOB: 1975/09/19, 38 y.o.   MRN: [id number]   Braulio Bosch 07/30/2013, 4:07 PM

## 2013-07-30 NOTE — Progress Notes (Signed)
Orthopedic Tech Progress Note Patient Details:  Georga Stys May 12, 1976 [id number] [facility] called for brace order Patient ID: Lezlie Lye, female   DOB: Sep 17, 1975, 38 y.o.   MRN: [id number]   Fenton Foy 07/30/2013, 11:19 AM

## 2013-07-30 NOTE — Progress Notes (Signed)
I saw and examined the patient with Carol Vargas, communicating the findings and plan noted above.  Chelsea Nusz, MD [facility] [phone] (p)  

## 2013-07-30 NOTE — Progress Notes (Signed)
Orthopaedic Trauma Service Progress Note  Subjective  Doing much better Pain is controlled No new issues   Review of Systems  Constitutional: Negative for fever and chills.  Respiratory: Negative for shortness of breath.   Cardiovascular: Negative for chest pain and palpitations.  Gastrointestinal: Negative for heartburn, nausea and vomiting.  Genitourinary: Negative for dysuria.  Neurological: Negative for tingling, sensory change and headaches.      Objective   BP 131/77  Pulse 78  Temp(Src) 98.3 F (36.8 C) (Oral)  Resp 14  Ht 5\' 9"  (1.753 m)  Wt 121.473 kg (267 lb 12.8 oz)  BMI 39.53 kg/m2  SpO2 99%  LMP 07/08/2013  Intake/Output     01/21 0701 - 01/22 0700 01/22 0701 - 01/23 0700   P.O.     I.V. (mL/kg) 774 (6.4)    Total Intake(mL/kg) 774 (6.4)    Urine (mL/kg/hr)     Blood     Total Output       Net +774          Urine Occurrence 4 x      Labs  Results for Carol, Vargas (MRN [id number]) as of 07/30/2013 12:43  Ref. Range 07/30/2013 03:56  Sodium Latest Range: 137-147 mEq/L 137  Potassium Latest Range: 3.7-5.3 mEq/L 4.2  Chloride Latest Range: 96-112 mEq/L 99  CO2 Latest Range: 19-32 mEq/L 26  BUN Latest Range: 6-23 mg/dL 10  Creatinine Latest Range: 0.50-1.10 mg/dL 0.84  Calcium Latest Range: 8.4-10.5 mg/dL 8.7  GFR calc non Af Amer Latest Range: >90 mL/min 86 (L)  GFR calc Af Amer Latest Range: >90 mL/min >90  Glucose Latest Range: 70-99 mg/dL 91    Exam   Gen: awake and alert, sitting chair Lungs: clear B   Cardiac: RRR, s1 and s2 Abd: +  BS, NTND Ext:        Right Lower Extremity               Dressing c/d/i             Knee immobilizer in place             DPN, SPN, TN sensation intact             EHL, FHL, AT, PT, peroneals, gastroc motor intact             Ext warm             + DP pulse             No DCT             Compartments soft and NT             No pain with passive stretch     Assessment and Plan   POD/HD#:  2  1. R bicondylar tibial plateau fracture s/p ORIF             NWB x 8 weeks             Unrestricted R knee ROM, foot and ankle ROM as tolerated             Will have fitted for hinge knee brace             Continue with Ice and elevation                       Dressing change tomorrow  PT/OT                                    2. Pain management:            dc PCA  PO meds  3. ABL anemia/Hemodynamics             Stable              4. DVT/PE prophylaxis:             Lovenox x 4 weeks  5. ID:               Completed periop abx  6. Medical issues             HTN controlled, continuing to hold lisinopril for time being  7. Activity:             As tolerated             See #1  8. FEN/Foley/Lines:             Advance diet as tolerated             KVO IVF  9. Dispo:             PT/OT             SW eval for snf, pt with limited help at home               Anticipate ready for dc Friday if snf available       Jari Pigg, PA-C [facility] [phone] (P) 07/30/2013 12:38 PM
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
## 2013-07-31 LAB — CBC
HCT: 27.8 % — ABNORMAL LOW (ref 36.0–46.0)
Hemoglobin: 9 g/dL — ABNORMAL LOW (ref 12.0–15.0)
MCH: 28.4 pg (ref 26.0–34.0)
MCHC: 32.4 g/dL (ref 30.0–36.0)
MCV: 87.7 fL (ref 78.0–100.0)
PLATELETS: 243 10*3/uL (ref 150–400)
RBC: 3.17 MIL/uL — ABNORMAL LOW (ref 3.87–5.11)
RDW: 13.7 % (ref 11.5–15.5)
WBC: 8.3 10*3/uL (ref 4.0–10.5)

## 2013-07-31 LAB — BASIC METABOLIC PANEL
BUN: 8 mg/dL (ref 6–23)
CALCIUM: 8.9 mg/dL (ref 8.4–10.5)
CO2: 26 mEq/L (ref 19–32)
Chloride: 102 mEq/L (ref 96–112)
Creatinine, Ser: 0.76 mg/dL (ref 0.50–1.10)
Glucose, Bld: 101 mg/dL — ABNORMAL HIGH (ref 70–99)
POTASSIUM: 4.2 meq/L (ref 3.7–5.3)
Sodium: 141 mEq/L (ref 137–147)

## 2013-07-31 MED ORDER — OXYCODONE HCL 5 MG PO TABS: 5.0000 mg | ORAL_TABLET | ORAL | Status: DC | PRN

## 2013-07-31 MED ORDER — OXYCODONE-ACETAMINOPHEN 5-325 MG PO TABS: 1.0000 | ORAL_TABLET | ORAL | Status: DC | PRN

## 2013-07-31 MED ORDER — ENOXAPARIN SODIUM 40 MG/0.4ML ~~LOC~~ SOLN: 40.0000 mg | SUBCUTANEOUS | Status: DC

## 2013-07-31 MED ORDER — METHOCARBAMOL 500 MG PO TABS: 500.0000 mg | ORAL_TABLET | Freq: Four times a day (QID) | ORAL | Status: DC | PRN

## 2013-07-31 NOTE — Discharge Instructions (Signed)
[facility] Discharge Instructions   General Discharge Instructions  WEIGHT BEARING STATUS: nonweight bearing R leg  RANGE OF MOTION/ACTIVITY: Range of motion as tolerated R knee. No ROM restrictions to R hip or ankle.  Ok to leave brace off for sleeping.  Brace on during ambulation   Wound care/soft tissue care: dry dressing changes daily or every other day.  Ok to shower and wash surgical wounds with soap and water. See detailed instructions below.  TED hose or other compressive wrap to Leg from foot to thigh.  Ok to remove during sleep   Diet: as you were eating previously.  Can use over the counter stool softeners and bowel preparations, such as Miralax, to help with bowel movements.  Narcotics can be constipating.  Be sure to drink plenty of fluids  STOP SMOKING OR USING NICOTINE PRODUCTS!!!!  As discussed nicotine severely impairs your body's ability to heal surgical and traumatic wounds but also impairs bone healing.  Wounds and bone heal by forming microscopic blood vessels (angiogenesis) and nicotine is a vasoconstrictor (essentially, shrinks blood vessels).  Therefore, if vasoconstriction occurs to these microscopic blood vessels they essentially disappear and are unable to deliver necessary nutrients to the healing tissue.  This is one modifiable factor that you can do to dramatically increase your chances of healing your injury.    (This means no smoking, no nicotine gum, patches, etc)  DO NOT USE NONSTEROIDAL ANTI-INFLAMMATORY DRUGS (NSAID'S)  Using products such as Advil (ibuprofen), Aleve (naproxen), Motrin (ibuprofen) for additional pain control during fracture healing can delay and/or prevent the healing response.  If you would like to take over the counter (OTC) medication, Tylenol (acetaminophen) is ok.  However, some narcotic medications that are given for pain control contain acetaminophen as well. Therefore, you should not exceed more than 4000 mg of tylenol in  a day if you do not have liver disease.  Also note that there are may OTC medicines, such as cold medicines and allergy medicines that my contain tylenol as well.  If you have any questions about medications and/or interactions please ask your doctor/PA or your pharmacist.   PAIN MEDICATION USE AND EXPECTATIONS  You have likely been given narcotic medications to help control your pain.  After a traumatic event that results in an fracture (broken bone) with or without surgery, it is ok to use narcotic pain medications to help control one's pain.  We understand that everyone responds to pain differently and each individual patient will be evaluated on a regular basis for the continued need for narcotic medications. Ideally, narcotic medication use should last no more than 6-8 weeks (coinciding with fracture healing).   As a patient it is your responsibility as well to monitor narcotic medication use and report the amount and frequency you use these medications when you come to your office visit.   We would also advise that if you are using narcotic medications, you should take a dose prior to therapy to maximize you participation.  IF YOU ARE ON NARCOTIC MEDICATIONS IT IS NOT PERMISSIBLE TO OPERATE A MOTOR VEHICLE (MOTORCYCLE/CAR/TRUCK/MOPED) OR HEAVY MACHINERY DO NOT MIX NARCOTICS WITH OTHER CNS (CENTRAL NERVOUS SYSTEM) DEPRESSANTS SUCH AS ALCOHOL       ICE AND ELEVATE INJURED/OPERATIVE EXTREMITY  Using ice and elevating the injured extremity above your heart can help with swelling and pain control.  Icing in a pulsatile fashion, such as 20 minutes on and 20 minutes off, can be followed.    Do  not place ice directly on skin. Make sure there is a barrier between to skin and the ice pack.    Using frozen items such as frozen peas works well as the conform nicely to the are that needs to be iced.  USE AN ACE WRAP OR TED HOSE FOR SWELLING CONTROL  In addition to icing and elevation, Ace wraps or TED  hose are used to help limit and resolve swelling.  It is recommended to use Ace wraps or TED hose until you are informed to stop.    When using Ace Wraps start the wrapping distally (farthest away from the body) and wrap proximally (closer to the body)   Example: If you had surgery on your leg or thing and you do not have a splint on, start the ace wrap at the toes and work your way up to the thigh        If you had surgery on your upper extremity and do not have a splint on, start the ace wrap at your fingers and work your way up to the upper arm  IF YOU ARE IN A SPLINT OR CAST DO NOT [place]   If your splint gets wet for any reason please contact the office immediately. You may shower in your splint or cast as long as you keep it dry.  This can be done by wrapping in a cast cover or garbage back (or similar)  Do Not stick any thing down your splint or cast such as pencils, money, or hangers to try and scratch yourself with.  If you feel itchy take benadryl as prescribed on the bottle for itching  IF YOU ARE IN A CAM BOOT (BLACK BOOT)  You may remove boot periodically. Perform daily dressing changes as noted below.  Wash the liner of the boot regularly and wear a sock when wearing the boot. It is recommended that you sleep in the boot until told otherwise  CALL THE OFFICE WITH ANY QUESTIONS OR CONCERTS: [phone]     Discharge Pin Site Instructions  Dress pins daily with Kerlix roll starting on POD 2. Wrap the Kerlix so that it tamps the skin down around the pin-skin interface to prevent/limit motion of the skin relative to the pin.  (Pin-skin motion is the primary cause of pain and infection related to external fixator pin sites).  Remove any crust or coagulum that may obstruct drainage with a saline moistened gauze or soap and water.  After POD 3, if there is no discernable drainage on the pin site dressing, the interval for change can by increased to every other  day.  You may shower with the fixator, cleaning all pin sites gently with soap and water.  If you have a surgical wound this needs to be completely dry and without drainage before showering.  The extremity can be lifted by the fixator to facilitate wound care and transfers.  Notify the office/Doctor if you experience increasing drainage, redness, or pain from a pin site, or if you notice purulent (thick, snot-like) drainage.  Discharge Wound Care Instructions  Do NOT apply any ointments, solutions or lotions to pin sites or surgical wounds.  These prevent needed drainage and even though solutions like hydrogen peroxide kill bacteria, they also damage cells lining the pin sites that help fight infection.  Applying lotions or ointments can keep the wounds moist and can cause them to breakdown and open up as well. This can increase the risk for  infection. When in doubt call the office.  Surgical incisions should be dressed daily.  If any drainage is noted, use one layer of adaptic, then gauze, Kerlix, and an ace wrap.  Once the incision is completely dry and without drainage, it may be left open to air out.  Showering may begin 36-48 hours later.  Cleaning gently with soap and water.  Traumatic wounds should be dressed daily as well.    One layer of adaptic, gauze, Kerlix, then ace wrap.  The adaptic can be discontinued once the draining has ceased    If you have a wet to dry dressing: wet the gauze with saline the squeeze as much saline out so the gauze is moist (not soaking wet), place moistened gauze over wound, then place a dry gauze over the moist one, followed by Kerlix wrap, then ace wrap.
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
## 2013-07-31 NOTE — Progress Notes (Signed)
[facility]thopaedic Trauma Service Progress Note  Subjective  Doing great!! No new issues Tolerating diet Pain controlled with PO meds Fitted for hinge brace   Review of Systems  Constitutional: Negative for fever and chills.  Eyes: Negative for blurred vision.  Cardiovascular: Negative for chest pain and palpitations.  Gastrointestinal: Negative for nausea, vomiting and abdominal pain.  Genitourinary: Negative for dysuria.  Neurological: Negative for tingling, sensory change and headaches.     Objective   BP 117/69  Pulse 70  Temp(Src) 98.3 F (36.8 C) (Oral)  Resp 20  Ht 5\' 9"  (1.753 m)  Wt 121.473 kg (267 lb 12.8 oz)  BMI 39.53 kg/m2  SpO2 97%  LMP 07/08/2013  Intake/Output     01/22 0701 - 01/23 0700 01/23 0701 - 01/24 0700   I.V. (mL/kg)     Total Intake(mL/kg)     Net            Urine Occurrence 4 x      Labs  Results for AHRIANA, GUNKEL (MRN [id number]) as of 07/31/2013 08:58  Ref. Range 07/31/2013 03:20  Sodium Latest Range: 137-147 mEq/L 141  Potassium Latest Range: 3.7-5.3 mEq/L 4.2  Chloride Latest Range: 96-112 mEq/L 102  CO2 Latest Range: 19-32 mEq/L 26  BUN Latest Range: 6-23 mg/dL 8  Creatinine Latest Range: 0.50-1.10 mg/dL 0.76  Calcium Latest Range: 8.4-10.5 mg/dL 8.9  GFR calc non Af Amer Latest Range: >90 mL/min >90  GFR calc Af Amer Latest Range: >90 mL/min >90  Glucose Latest Range: 70-99 mg/dL 101 (H)  WBC Latest Range: 4.0-10.5 K/uL 8.3  RBC Latest Range: 3.87-5.11 MIL/uL 3.17 (L)  Hemoglobin Latest Range: 12.0-15.0 g/dL 9.0 (L)  HCT Latest Range: 36.0-46.0 % 27.8 (L)  MCV Latest Range: 78.0-100.0 fL 87.7  MCH Latest Range: 26.0-34.0 pg 28.4  MCHC Latest Range: 30.0-36.0 g/dL 32.4  RDW Latest Range: 11.5-15.5 % 13.7  Platelets Latest Range: 150-400 K/uL 243    Exam  Gen: awake and alert, NAD, comfortable Lungs: clear B Cardiac: s1 and s2, RRR Abd: +  BS, NTND Ext:       Right Lower Extremity   Incisions look fantastic   No signs  of infection  No drainage  pinsites healing nicely  Distal motor and sensory functions intact  Ext warm  + DP pulse  No DCT  Compartments soft and NT  Swelling stable     Assessment and Plan   POD/HD#: 3    1. R bicondylar tibial plateau fracture s/p ORIF             NWB x 8 weeks             Unrestricted R knee ROM, foot and ankle ROM as tolerated             hinge knee brace when up. Ok to leave off when in bed/sleeping             Continue with Ice and elevation                       Dressing changes as needed  Ok to shower, soap and water only.  No ointments, lotions or solutions              PT/OT  2. Pain management:                         PO meds  3. ABL anemia/Hemodynamics             Stable              4. DVT/PE prophylaxis:             Lovenox x 4 weeks after discharge  5. ID:               Completed periop abx  6. Medical issues             HTN controlled, continuing to hold lisinopril for time being  7. Activity:             As tolerated             See #1  8. FEN/Foley/Lines:             Advance diet as tolerated             nsl   9. Dispo:             PT/OT             SW eval for snf, pt with limited help at home    Pt is ready for snf when bed available                 Jari Pigg, PA-C [facility] [phone] (P) 07/31/2013 8:57 AM
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
## 2013-07-31 NOTE — Progress Notes (Addendum)
UR completed.  Await CSW and SNF placement process. Pt attempted home discharge while awaiting surgery and was not successful there, had multiple falls, the last requiring earlier admission for this most recent surgery.  Unfortunately will not be able to assist pt with medications for this discharge as that was done earlier this month at her last d/c and program only allows once per 35-month period.   Sandi Mariscal, RN BSN [facility] Trauma/Neuro ICU Case Manager [phone]

## 2013-07-31 NOTE — Progress Notes (Signed)
Chaplain offered spiritual and emotional support. Patient had no pastoral needs. Patient asked me to come back to see her. Patient thanked the chaplain for the pastoral visit.   07/31/13 1600  Clinical Encounter Type  Visited With Patient  Visit Type Initial

## 2013-07-31 NOTE — Progress Notes (Addendum)
Pt to be d/c today to [facility] in [place].   Pt and family agreeable. Confirmed plans with facility.  Plan transfer via EMS. PTAR stated that there are 9 ahead of the Pt and it may take several hours for transport.     [facility]  [address] 1[address];  [phone] Phone: [phone]

## 2013-08-03 ENCOUNTER — Encounter (HOSPITAL_COMMUNITY): Payer: Self-pay | Admitting: Orthopedic Surgery

## 2013-08-03 NOTE — Progress Notes (Signed)
I agree with the following treatment note after reviewing documentation.   Carolyn Maniscalco, Brynn OTR/L Pager: [phone] Office: [phone] .   

## 2013-12-30 ENCOUNTER — Other Ambulatory Visit: Payer: Self-pay | Admitting: Orthopedic Surgery

## 2013-12-30 DIAGNOSIS — M25561 Pain in right knee: Secondary | ICD-10-CM

## 2014-01-01 ENCOUNTER — Ambulatory Visit
Admission: RE | Admit: 2014-01-01 | Discharge: 2014-01-01 | Disposition: A | Discharge status: Home / Self Care | Payer: Medicaid Other | Source: Ambulatory Visit | Attending: Orthopedic Surgery | Admitting: Orthopedic Surgery

## 2014-01-01 DIAGNOSIS — M25561 Pain in right knee: Secondary | ICD-10-CM

## 2014-01-01 IMAGING — CT CT KNEE*R* W/O CM
1 of 5 series · 3 of 14 positions shown, 4 images · non-contrast
Comparison: 07/28/2013.  07/08/2013.

CLINICAL DATA: Generalized knee pain. Tibial plateau fracture with
fixation July 2013.

EXAM:
CT OF THE RIGHT KNEE WITHOUT CONTRAST
TECHNIQUE: Multidetector CT imaging of the RIGHT knee was performed according
to the standard protocol. Multiplanar CT image reconstructions were
also generated.

[Series 3: knee detail · axial · 0.35mm/px · z∈[-154,+36]mm · 3 of 77 slices shown, 4 images]
[im 1/77  soft-tissue]
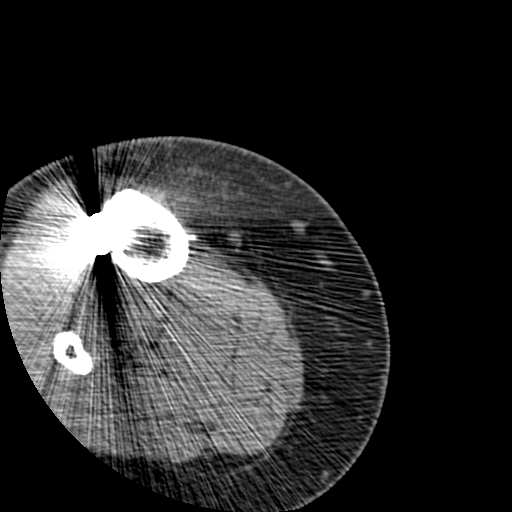
[im 1/77  bone]
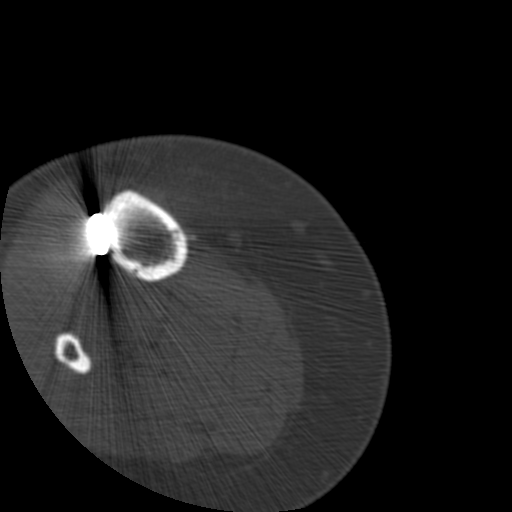
[im 39/77  bone]
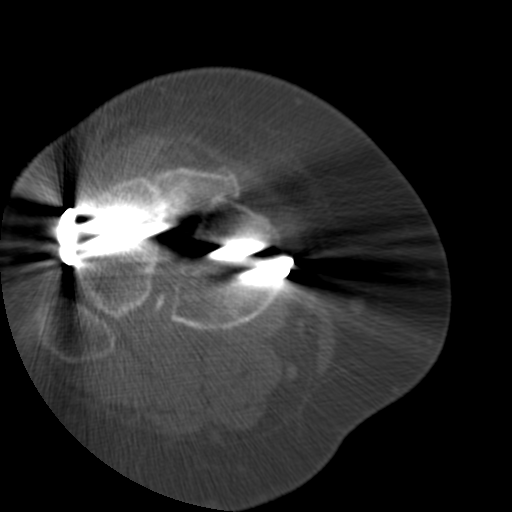
[im 77/77  bone]
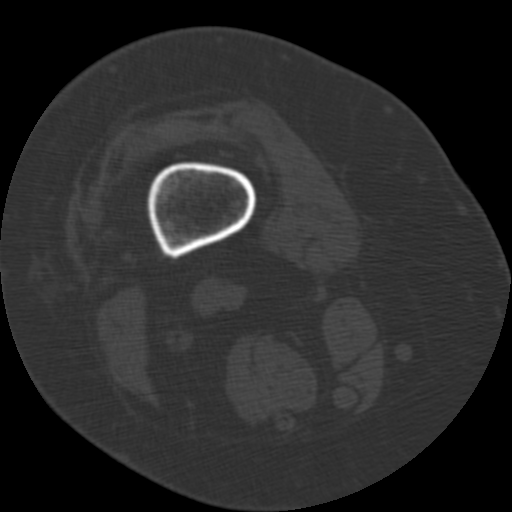

[3 of 14 positions shown; findings below may reference images not displayed]

FINDINGS: Bicondylar tibial plateau fracture status post lateral approach
buttress plate and screw fixation. The proximal screws traverse the
vertical split fracture and terminate in the medial tibial plateau.
The fracture is healed, with bridging bone along the posterior
cortex. There is still a substantial gap in the lateral tibial
plateau extending to the tibial eminence, measuring 17 mm
transverse.

There is no hardware complication or failure. Lateral compartment
osteoarthritis is present with subchondral cysts in the far lateral
femoral condyle. Subchondral cysts are developing in the tibial
plateau is well. Bony debris is present in the lateral aspect of the
knee joint. There is ala moderate knee effusion. The patella and
femur appear within normal limits. Proximal fibula appears normal.
Mild disuse osteopenia.
IMPRESSION: 1. Healed tibial plateau fracture status post lateral buttress plate
and screw fixation.
2. Articular surface gap in the lateral tibial plateau extending to
the intercondylar eminence measuring 17 mm in width.

## 2014-06-18 ENCOUNTER — Ambulatory Visit
Admission: RE | Admit: 2014-06-18 | Discharge: 2014-06-18 | Disposition: A | Discharge status: Home / Self Care | Payer: Medicaid Other | Source: Ambulatory Visit | Attending: Orthopedic Surgery | Admitting: Orthopedic Surgery

## 2014-06-18 ENCOUNTER — Other Ambulatory Visit: Payer: Self-pay | Admitting: Orthopedic Surgery

## 2014-06-18 DIAGNOSIS — M25561 Pain in right knee: Secondary | ICD-10-CM
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
## 2014-06-21 ENCOUNTER — Other Ambulatory Visit: Payer: Self-pay | Admitting: Orthopedic Surgery

## 2014-06-21 DIAGNOSIS — S82101K Unspecified fracture of upper end of right tibia, subsequent encounter for closed fracture with nonunion: Secondary | ICD-10-CM

## 2014-07-13 ENCOUNTER — Other Ambulatory Visit: Payer: Self-pay | Admitting: Orthopedic Surgery

## 2014-07-22 ENCOUNTER — Encounter (HOSPITAL_COMMUNITY): Payer: Self-pay | Admitting: Orthopedic Surgery

## 2014-07-22 NOTE — Pre-Procedure Instructions (Addendum)
Carol Vargas  07/22/2014   Your procedure is scheduled on:  Monday, January 25th  Report to [facility] Admitting at 8 AM.  Call this number if you have problems the morning of surgery: [phone]   Remember:   Do not eat food or drink liquids after midnight.   Take these medicines the morning of surgery with A SIP OF WATER:pain medication if needed    STOP all herbel meds, nsaids (aleve,naproxen,advil,ibuprofen) 5 days prior to surgery 07/28/14 including aspirin, vitamins,        Do not wear jewelry, make-up or nail polish.  Do not wear lotions, powders, or perfumes. You may wear deodorant.  Do not shave 48 hours prior to surgery. Men may shave face and neck.  Do not bring valuables to the hospital.  [facility] is not responsible for any belongings or valuables.               Contacts, dentures or bridgework may not be worn into surgery.  Leave suitcase in the car. After surgery it may be brought to your room.  For patients admitted to the hospital, discharge time is determined by your treatment team.               Patients discharged the day of surgery will not be allowed to drive home.  Please read over the following fact sheets that you were given: Pain Booklet, Coughing and Deep Breathing and Surgical Site Infection Prevention  [place] - Preparing for Surgery  Before surgery, you can play an important role.  Because skin is not sterile, your skin needs to be as free of germs as possible.  You can reduce the number of germs on you skin by washing with CHG (chlorahexidine gluconate) soap before surgery.  CHG is an antiseptic cleaner which kills germs and bonds with the skin to continue killing germs even after washing.  Please DO NOT use if you have an allergy to CHG or antibacterial soaps.  If your skin becomes reddened/irritated stop using the CHG and inform your nurse when you arrive at Short Stay.  Do not shave (including legs and underarms) for at least 48  hours prior to the first CHG shower.  You may shave your face.  Please follow these instructions carefully:   1.  Shower with CHG Soap the night before surgery and the morning of Surgery.  2.  If you choose to wash your hair, wash your hair first as usual with your normal shampoo.  3.  After you shampoo, rinse your hair and body thoroughly to remove the shampoo.  4.  Use CHG as you would any other liquid soap.  You can apply CHG directly to the skin and wash gently with scrungie or a clean washcloth.  5.  Apply the CHG Soap to your body ONLY FROM THE NECK DOWN.  Do not use on open wounds or open sores.  Avoid contact with your eyes, ears, mouth and genitals (private parts).  Wash genitals (private parts) with your normal soap.  6.  Wash thoroughly, paying special attention to the area where your surgery will be performed.  7.  Thoroughly rinse your body with warm water from the neck down.  8.  DO NOT shower/wash with your normal soap after using and rinsing off the CHG Soap.  9.  Pat yourself dry with a clean towel.            10.  Wear clean pajamas.  11.  Place clean sheets on your bed the night of your first shower and do not sleep with pets.  Day of Surgery  Do not apply any lotions/deoderants the morning of surgery.  Please wear clean clothes to the hospital/surgery center.

## 2014-07-23 ENCOUNTER — Encounter (HOSPITAL_COMMUNITY): Payer: Self-pay

## 2014-07-23 ENCOUNTER — Encounter (HOSPITAL_COMMUNITY)
Admission: RE | Admit: 2014-07-23 | Discharge: 2014-07-23 | Disposition: A | Discharge status: Home / Self Care | Payer: 59 | Source: Ambulatory Visit | Attending: Orthopedic Surgery | Admitting: Orthopedic Surgery

## 2014-07-23 DIAGNOSIS — Z01812 Encounter for preprocedural laboratory examination: Secondary | ICD-10-CM | POA: Insufficient documentation

## 2014-07-23 DIAGNOSIS — Y838 Other surgical procedures as the cause of abnormal reaction of the patient, or of later complication, without mention of misadventure at the time of the procedure: Secondary | ICD-10-CM | POA: Insufficient documentation

## 2014-07-23 DIAGNOSIS — T8484XA Pain due to internal orthopedic prosthetic devices, implants and grafts, initial encounter: Secondary | ICD-10-CM | POA: Insufficient documentation

## 2014-07-23 LAB — BASIC METABOLIC PANEL
Anion gap: 12 (ref 5–15)
BUN: 11 mg/dL (ref 6–23)
CO2: 30 mmol/L (ref 19–32)
Calcium: 9.4 mg/dL (ref 8.4–10.5)
Chloride: 97 mEq/L (ref 96–112)
Creatinine, Ser: 0.88 mg/dL (ref 0.50–1.10)
GFR calc Af Amer: >90 mL/min (ref >90)
GFR calc non Af Amer: 81 mL/min — ABNORMAL LOW (ref >90)
GLUCOSE: 102 mg/dL — AB (ref 70–99)
POTASSIUM: 4 mmol/L (ref 3.5–5.1)
Sodium: 139 mmol/L (ref 135–145)

## 2014-07-23 LAB — CBC
HCT: 41 % (ref 36.0–46.0)
HEMOGLOBIN: 13.8 g/dL (ref 12.0–15.0)
MCH: 29.7 pg (ref 26.0–34.0)
MCHC: 33.7 g/dL (ref 30.0–36.0)
MCV: 88.4 fL (ref 78.0–100.0)
PLATELETS: 220 10*3/uL (ref 150–400)
RBC: 4.64 MIL/uL (ref 3.87–5.11)
RDW: 13.1 % (ref 11.5–15.5)
WBC: 7.3 10*3/uL (ref 4.0–10.5)

## 2014-07-23 LAB — HCG, SERUM, QUALITATIVE: Preg, Serum: NEGATIVE

## 2014-08-01 MED ORDER — DEXTROSE 5 % IV SOLN
3.0000 g | INTRAVENOUS | Status: AC
  Administered 2014-08-02: 3 g via INTRAVENOUS
  Filled 2014-08-01: qty 3000

## 2014-08-01 MED ORDER — CHLORHEXIDINE GLUCONATE 4 % EX LIQD
60.0000 mL | Freq: Once | CUTANEOUS | Status: DC
Start: 2014-08-01 — End: 2014-08-02
  Filled 2014-08-01: qty 60

## 2014-08-01 MED ORDER — CHLORHEXIDINE GLUCONATE 4 % EX LIQD
60.0000 mL | Freq: Once | CUTANEOUS | Status: DC
  Filled 2014-08-01: qty 60

## 2014-08-02 ENCOUNTER — Ambulatory Visit (HOSPITAL_COMMUNITY): Payer: 59 | Admitting: Anesthesiology

## 2014-08-02 ENCOUNTER — Encounter (HOSPITAL_COMMUNITY): Payer: Self-pay | Admitting: *Deleted

## 2014-08-02 ENCOUNTER — Ambulatory Visit (HOSPITAL_COMMUNITY)
Admission: RE | Admit: 2014-08-02 | Discharge: 2014-08-02 | Disposition: A | Discharge status: Home / Self Care | Payer: 59 | Source: Ambulatory Visit | Attending: Orthopedic Surgery | Admitting: Orthopedic Surgery

## 2014-08-02 ENCOUNTER — Encounter (HOSPITAL_COMMUNITY)
Admission: RE | Disposition: A | Discharge status: Home / Self Care | Payer: Self-pay | Source: Ambulatory Visit | Attending: Orthopedic Surgery

## 2014-08-02 DIAGNOSIS — Y929 Unspecified place or not applicable: Secondary | ICD-10-CM | POA: Diagnosis not present

## 2014-08-02 DIAGNOSIS — G4730 Sleep apnea, unspecified: Secondary | ICD-10-CM | POA: Insufficient documentation

## 2014-08-02 DIAGNOSIS — Z87891 Personal history of nicotine dependence: Secondary | ICD-10-CM | POA: Insufficient documentation

## 2014-08-02 DIAGNOSIS — Y939 Activity, unspecified: Secondary | ICD-10-CM | POA: Insufficient documentation

## 2014-08-02 DIAGNOSIS — G4733 Obstructive sleep apnea (adult) (pediatric): Secondary | ICD-10-CM | POA: Diagnosis not present

## 2014-08-02 DIAGNOSIS — S83206A Unspecified tear of unspecified meniscus, current injury, right knee, initial encounter: Secondary | ICD-10-CM | POA: Insufficient documentation

## 2014-08-02 DIAGNOSIS — Z6841 Body Mass Index (BMI) 40.0 and over, adult: Secondary | ICD-10-CM | POA: Diagnosis not present

## 2014-08-02 DIAGNOSIS — I10 Essential (primary) hypertension: Secondary | ICD-10-CM | POA: Insufficient documentation

## 2014-08-02 DIAGNOSIS — X58XXXA Exposure to other specified factors, initial encounter: Secondary | ICD-10-CM | POA: Insufficient documentation

## 2014-08-02 DIAGNOSIS — M94261 Chondromalacia, right knee: Secondary | ICD-10-CM | POA: Diagnosis not present

## 2014-08-02 DIAGNOSIS — Y999 Unspecified external cause status: Secondary | ICD-10-CM | POA: Insufficient documentation

## 2014-08-02 DIAGNOSIS — M1732 Unilateral post-traumatic osteoarthritis, left knee: Secondary | ICD-10-CM | POA: Insufficient documentation

## 2014-08-02 DIAGNOSIS — M25561 Pain in right knee: Secondary | ICD-10-CM | POA: Diagnosis present

## 2014-08-02 DIAGNOSIS — Z8249 Family history of ischemic heart disease and other diseases of the circulatory system: Secondary | ICD-10-CM | POA: Diagnosis not present

## 2014-08-02 HISTORY — PX: KNEE ARTHROSCOPY: SHX127

## 2014-08-02 HISTORY — PX: HARDWARE REMOVAL: SHX979

## 2014-08-02 SURGERY — ARTHROSCOPY, KNEE
Anesthesia: Regional | Site: Knee | Laterality: Right

## 2014-08-02 MED ORDER — SODIUM CHLORIDE 0.9 % IR SOLN
Status: DC | PRN
  Administered 2014-08-02: 1000 mL

## 2014-08-02 MED ORDER — ONDANSETRON HCL 4 MG/2ML IJ SOLN
INTRAMUSCULAR | Status: DC | PRN
  Administered 2014-08-02 (×2): 4 mg via INTRAVENOUS

## 2014-08-02 MED ORDER — PROPOFOL 10 MG/ML IV BOLUS
INTRAVENOUS | Status: DC | PRN
  Administered 2014-08-02: 200 mg via INTRAVENOUS

## 2014-08-02 MED ORDER — IBUPROFEN 200 MG PO TABS: 800.0000 mg | ORAL_TABLET | Freq: Three times a day (TID) | ORAL | Status: DC

## 2014-08-02 MED ORDER — DEXAMETHASONE SODIUM PHOSPHATE 4 MG/ML IJ SOLN
INTRAMUSCULAR | Status: AC
  Filled 2014-08-02: qty 1

## 2014-08-02 MED ORDER — LACTATED RINGERS IV SOLN
INTRAVENOUS | Status: DC | PRN
  Administered 2014-08-02 (×2): via INTRAVENOUS

## 2014-08-02 MED ORDER — OXYCODONE HCL 5 MG PO TABS
5.0000 mg | ORAL_TABLET | Freq: Once | ORAL | Status: AC | PRN
  Administered 2014-08-02: 5 mg via ORAL

## 2014-08-02 MED ORDER — MIDAZOLAM HCL 2 MG/2ML IJ SOLN
INTRAMUSCULAR | Status: AC
  Filled 2014-08-02: qty 2

## 2014-08-02 MED ORDER — FENTANYL CITRATE 0.05 MG/ML IJ SOLN
INTRAMUSCULAR | Status: AC
  Administered 2014-08-02: 50 ug
  Filled 2014-08-02: qty 2

## 2014-08-02 MED ORDER — HYDROMORPHONE HCL 1 MG/ML IJ SOLN
0.2500 mg | INTRAMUSCULAR | Status: DC | PRN
  Administered 2014-08-02: 0.5 mg via INTRAVENOUS
  Administered 2014-08-02 (×2): 0.25 mg via INTRAVENOUS
  Administered 2014-08-02 (×2): 0.5 mg via INTRAVENOUS

## 2014-08-02 MED ORDER — EPHEDRINE SULFATE 50 MG/ML IJ SOLN
INTRAMUSCULAR | Status: AC
  Filled 2014-08-02: qty 1

## 2014-08-02 MED ORDER — PROMETHAZINE HCL 25 MG/ML IJ SOLN: 6.2500 mg | INTRAMUSCULAR | Status: DC | PRN

## 2014-08-02 MED ORDER — LACTATED RINGERS IV SOLN
INTRAVENOUS | Status: DC
  Administered 2014-08-02: 09:00:00 via INTRAVENOUS

## 2014-08-02 MED ORDER — OXYCODONE HCL 5 MG/5ML PO SOLN: 5.0000 mg | Freq: Once | ORAL | Status: AC | PRN

## 2014-08-02 MED ORDER — ONDANSETRON HCL 4 MG/2ML IJ SOLN
INTRAMUSCULAR | Status: AC
  Filled 2014-08-02: qty 2

## 2014-08-02 MED ORDER — LIDOCAINE HCL (CARDIAC) 20 MG/ML IV SOLN
INTRAVENOUS | Status: AC
  Filled 2014-08-02: qty 5

## 2014-08-02 MED ORDER — ONDANSETRON 4 MG PO TBDP
4.0000 mg | ORAL_TABLET | Freq: Once | ORAL | Status: AC
  Administered 2014-08-02: 4 mg via ORAL
  Filled 2014-08-02 (×2): qty 1

## 2014-08-02 MED ORDER — OXYCODONE HCL 5 MG PO TABS: 5.0000 mg | ORAL_TABLET | ORAL | Status: DC | PRN

## 2014-08-02 MED ORDER — FENTANYL CITRATE 0.05 MG/ML IJ SOLN
INTRAMUSCULAR | Status: AC
  Filled 2014-08-02: qty 5

## 2014-08-02 MED ORDER — HYDROMORPHONE HCL 1 MG/ML IJ SOLN
INTRAMUSCULAR | Status: AC
  Filled 2014-08-02: qty 1

## 2014-08-02 MED ORDER — HYDROMORPHONE HCL 1 MG/ML IJ SOLN
INTRAMUSCULAR | Status: DC
Start: 2014-08-02 — End: 2014-08-02
  Filled 2014-08-02: qty 1

## 2014-08-02 MED ORDER — FENTANYL CITRATE 0.05 MG/ML IJ SOLN
INTRAMUSCULAR | Status: DC | PRN
  Administered 2014-08-02 (×2): 50 ug via INTRAVENOUS
  Administered 2014-08-02 (×2): 25 ug via INTRAVENOUS
  Administered 2014-08-02: 50 ug via INTRAVENOUS
  Administered 2014-08-02 (×2): 25 ug via INTRAVENOUS

## 2014-08-02 MED ORDER — OXYCODONE HCL 5 MG PO TABS
ORAL_TABLET | ORAL | Status: AC
  Filled 2014-08-02: qty 1

## 2014-08-02 MED ORDER — LIDOCAINE HCL (CARDIAC) 20 MG/ML IV SOLN
INTRAVENOUS | Status: DC | PRN
  Administered 2014-08-02: 50 mg via INTRAVENOUS

## 2014-08-02 MED ORDER — EPHEDRINE SULFATE 50 MG/ML IJ SOLN
INTRAMUSCULAR | Status: DC | PRN
  Administered 2014-08-02 (×4): 10 mg via INTRAVENOUS

## 2014-08-02 MED ORDER — STERILE WATER FOR INJECTION IJ SOLN
INTRAMUSCULAR | Status: AC
  Filled 2014-08-02: qty 10

## 2014-08-02 MED ORDER — MIDAZOLAM HCL 2 MG/2ML IJ SOLN
INTRAMUSCULAR | Status: AC
  Administered 2014-08-02: 2 mg
  Filled 2014-08-02: qty 2

## 2014-08-02 MED ORDER — MEPERIDINE HCL 25 MG/ML IJ SOLN: 6.2500 mg | INTRAMUSCULAR | Status: DC | PRN

## 2014-08-02 MED ORDER — PROPOFOL 10 MG/ML IV BOLUS
INTRAVENOUS | Status: AC
  Filled 2014-08-02: qty 20

## 2014-08-02 MED ORDER — DEXAMETHASONE SODIUM PHOSPHATE 4 MG/ML IJ SOLN
INTRAMUSCULAR | Status: DC | PRN
  Administered 2014-08-02: 4 mg via INTRAVENOUS

## 2014-08-02 MED ORDER — PHENYLEPHRINE 40 MCG/ML (10ML) SYRINGE FOR IV PUSH (FOR BLOOD PRESSURE SUPPORT)
PREFILLED_SYRINGE | INTRAVENOUS | Status: AC
  Filled 2014-08-02: qty 10

## 2014-08-02 SURGICAL SUPPLY — 65 items
BANDAGE ELASTIC 4 VELCRO ST LF (GAUZE/BANDAGES/DRESSINGS) ×2 IMPLANT
BANDAGE ELASTIC 6 VELCRO ST LF (GAUZE/BANDAGES/DRESSINGS) ×2 IMPLANT
BANDAGE ESMARK 6X9 LF (GAUZE/BANDAGES/DRESSINGS) ×1 IMPLANT
BLADE GREAT WHITE 4.2 (BLADE) ×2 IMPLANT
BNDG CMPR 9X6 STRL LF SNTH (GAUZE/BANDAGES/DRESSINGS) ×1
BNDG COHESIVE 4X5 TAN STRL (GAUZE/BANDAGES/DRESSINGS) ×2 IMPLANT
BNDG ESMARK 6X9 LF (GAUZE/BANDAGES/DRESSINGS) ×2
BNDG GAUZE ELAST 4 BULKY (GAUZE/BANDAGES/DRESSINGS) ×2 IMPLANT
CLSR STERI-STRIP ANTIMIC 1/2X4 (GAUZE/BANDAGES/DRESSINGS) ×2 IMPLANT
COVER SURGICAL LIGHT HANDLE (MISCELLANEOUS) ×2 IMPLANT
DRAPE ARTHROSCOPY W/POUCH 114 (DRAPES) ×2 IMPLANT
DRAPE C-ARM 42X72 X-RAY (DRAPES) ×2 IMPLANT
DRAPE EXTREMITY T 121X128X90 (DRAPE) ×2 IMPLANT
DRAPE INCISE IOBAN 66X45 STRL (DRAPES) ×2 IMPLANT
DRAPE ORTHO SPLIT 77X108 STRL (DRAPES) ×4
DRAPE PROXIMA HALF (DRAPES) ×4 IMPLANT
DRAPE SURG ORHT 6 SPLT 77X108 (DRAPES) ×2 IMPLANT
DRAPE U-SHAPE 47X51 STRL (DRAPES) ×2 IMPLANT
DRSG ADAPTIC 3X8 NADH LF (GAUZE/BANDAGES/DRESSINGS) ×2 IMPLANT
DRSG EMULSION OIL 3X3 NADH (GAUZE/BANDAGES/DRESSINGS) ×2 IMPLANT
DRSG PAD ABDOMINAL 8X10 ST (GAUZE/BANDAGES/DRESSINGS) ×2 IMPLANT
ELECT REM PT RETURN 9FT ADLT (ELECTROSURGICAL) ×2
ELECTRODE REM PT RTRN 9FT ADLT (ELECTROSURGICAL) ×1 IMPLANT
GAUZE SPONGE 4X4 12PLY STRL (GAUZE/BANDAGES/DRESSINGS) ×2 IMPLANT
GAUZE XEROFORM 1X8 LF (GAUZE/BANDAGES/DRESSINGS) ×2 IMPLANT
GLOVE BIOGEL M SZ8.5 STRL (GLOVE) ×2 IMPLANT
GLOVE BIOGEL PI IND STRL 7.5 (GLOVE) ×1 IMPLANT
GLOVE BIOGEL PI IND STRL 8.5 (GLOVE) ×3 IMPLANT
GLOVE BIOGEL PI INDICATOR 7.5 (GLOVE) ×1
GLOVE BIOGEL PI INDICATOR 8.5 (GLOVE) ×3
GLOVE SURG ORTHO 7.0 STRL STRW (GLOVE) IMPLANT
GLOVE SURG ORTHO 8.0 STRL STRW (GLOVE) ×6 IMPLANT
GOWN STRL REUS W/ TWL LRG LVL3 (GOWN DISPOSABLE) ×2 IMPLANT
GOWN STRL REUS W/ TWL XL LVL3 (GOWN DISPOSABLE) ×2 IMPLANT
GOWN STRL REUS W/TWL LRG LVL3 (GOWN DISPOSABLE) ×2
GOWN STRL REUS W/TWL XL LVL3 (GOWN DISPOSABLE) ×8 IMPLANT
KIT BASIN OR (CUSTOM PROCEDURE TRAY) ×2 IMPLANT
KIT ROOM TURNOVER OR (KITS) ×2 IMPLANT
MANIFOLD NEPTUNE II (INSTRUMENTS) ×2 IMPLANT
NS IRRIG 1000ML POUR BTL (IV SOLUTION) ×2 IMPLANT
PACK ARTHROSCOPY DSU (CUSTOM PROCEDURE TRAY) ×2 IMPLANT
PACK GENERAL/GYN (CUSTOM PROCEDURE TRAY) ×2 IMPLANT
PAD ARMBOARD 7.5X6 YLW CONV (MISCELLANEOUS) ×4 IMPLANT
PAD CAST 4YDX4 CTTN HI CHSV (CAST SUPPLIES) ×1 IMPLANT
PADDING CAST COTTON 4X4 STRL (CAST SUPPLIES) ×2
PADDING CAST COTTON 6X4 STRL (CAST SUPPLIES) ×2 IMPLANT
PENCIL BUTTON HOLSTER BLD 10FT (ELECTRODE) IMPLANT
SET ARTHROSCOPY TUBING (MISCELLANEOUS) ×2
SET ARTHROSCOPY TUBING LN (MISCELLANEOUS) ×1 IMPLANT
SPONGE GAUZE 4X4 12PLY STER LF (GAUZE/BANDAGES/DRESSINGS) ×2 IMPLANT
SPONGE LAP 4X18 X RAY DECT (DISPOSABLE) ×2 IMPLANT
STAPLER VISISTAT 35W (STAPLE) ×2 IMPLANT
STOCKINETTE IMPERVIOUS 9X36 MD (GAUZE/BANDAGES/DRESSINGS) ×2 IMPLANT
SUT ETHILON 4 0 FS 1 (SUTURE) ×2 IMPLANT
SUT ETHILON 4 0 PS 2 18 (SUTURE) IMPLANT
SUT VIC AB 0 CT1 27 (SUTURE) ×4
SUT VIC AB 0 CT1 27XBRD ANBCTR (SUTURE) ×2 IMPLANT
SUT VIC AB 2-0 CT1 27 (SUTURE) ×4
SUT VIC AB 2-0 CT1 TAPERPNT 27 (SUTURE) ×2 IMPLANT
SYR 30ML LL (SYRINGE) ×4 IMPLANT
SYR BULB 3OZ (MISCELLANEOUS) ×2 IMPLANT
TOWEL OR 17X24 6PK STRL BLUE (TOWEL DISPOSABLE) ×2 IMPLANT
TOWEL OR 17X26 10 PK STRL BLUE (TOWEL DISPOSABLE) ×2 IMPLANT
WAND HAND CNTRL MULTIVAC 90 (MISCELLANEOUS) ×2 IMPLANT
WATER STERILE IRR 1000ML POUR (IV SOLUTION) ×2 IMPLANT

## 2014-08-02 NOTE — Op Note (Signed)
Dictation Number:  [id number]

## 2014-08-02 NOTE — Transfer of Care (Signed)
Immediate Anesthesia Transfer of Care Note  Patient: Carol Vargas  Procedure(s) Performed: Procedure(s): ARTHROSCOPY KNEE (Right) HARDWARE REMOVAL (Right)  Patient Location: PACU  Anesthesia Type:General  Level of Consciousness: awake and alert   Airway & Oxygen Therapy: Patient Spontanous Breathing and Patient connected to nasal cannula oxygen  Post-op Assessment: Report given to PACU RN and Post -op Vital signs reviewed and stable  Post vital signs: Reviewed and stable  Complications: No apparent anesthesia complications

## 2014-08-02 NOTE — Discharge Instructions (Signed)
Diet: As you were doing prior to hospitalization   Activity:  Increase activity slowly as tolerated                  No lifting or driving for 6 weeks  Shower:  May shower without a dressing once there is no drainage from your wound.                 Do NOT wash over the wound.                 Dressing:  You may change your dressing on Wednesday                    Then change the dressing daily with sterile 4"x4"s gauze dressing                     And TED hose for knees.  Weight Bearing:  Weight bearing as tolerated as taught in physical therapy.  Use a                                walker or Crutches as instructed.  To prevent constipation: you may use a stool softener such as -               Colace ( over the counter) 100 mg by mouth twice a day                Drink plenty of fluids ( prune juice may be helpful) and high fiber foods                Miralax ( over the counter) for constipation as needed.    Precautions:  If you experience chest pain or shortness of breath - call 911 immediately               For transfer to the hospital emergency department!!               If you develop a fever greater that 101 F, purulent drainage from wound,                             increased redness or drainage from wound, or calf pain -- Call the office.  Follow- Up Appointment:  Please call for an appointment to be seen on 08/05/14                                              [facility] office:  [phone]            [place], [place] [address]                 General Anesthesia, Adult, Care After  Refer to this sheet in the next few weeks. These instructions provide you with information on caring for yourself after your procedure. Your health care provider may also give you more specific instructions. Your treatment has been planned according to current medical practices, but problems sometimes occur. Call your health care provider if you have any problems or questions  after your procedure.  WHAT TO EXPECT AFTER THE PROCEDURE  After the procedure, it is typical to experience:  Sleepiness.  Nausea and vomiting. HOME  CARE INSTRUCTIONS  For the first 24 hours after general anesthesia:  Have a responsible person with you.  Do not drive a car. If you are alone, do not take public transportation.  Do not drink alcohol.  Do not take medicine that has not been prescribed by your health care provider.  Do not sign important papers or make important decisions.  You may resume a normal diet and activities as directed by your health care provider.  Change bandages (dressings) as directed.  If you have questions or problems that seem related to general anesthesia, call the hospital and ask for the anesthetist or anesthesiologist on call. SEEK MEDICAL CARE IF:  You have nausea and vomiting that continue the day after anesthesia.  You develop a rash. SEEK IMMEDIATE MEDICAL CARE IF:  You have difficulty breathing.  You have chest pain.  You have any allergic problems. Document Released: 10/01/2000 Document Revised: 02/25/2013 Document Reviewed: 01/08/2013  [facility] Patient Information 2014 [place], [place].

## 2014-08-02 NOTE — Anesthesia Preprocedure Evaluation (Addendum)
Anesthesia Evaluation  Patient identified by MRN, date of birth, ID band Patient awake    Reviewed: Allergy & Precautions, H&P , NPO status , Patient's Chart, lab work & pertinent test results  Airway Mallampati: II  TM Distance: >3 FB Neck ROM: Full    Dental  (+) Teeth Intact, Dental Advisory Given   Pulmonary sleep apnea , former smoker,  breath sounds clear to auscultation        Cardiovascular hypertension, Pt. on medications Rhythm:Regular Rate:Normal     Neuro/Psych  Headaches, negative neurological ROS  negative psych ROS   GI/Hepatic negative GI ROS, Neg liver ROS,   Endo/Other  Morbid obesity  Renal/GU negative Renal ROS     Musculoskeletal negative musculoskeletal ROS (+)   Abdominal (+) + obese,   Peds  Hematology negative hematology ROS (+)   Anesthesia Other Findings   Reproductive/Obstetrics negative OB ROS                            Anesthesia Physical  Anesthesia Plan  ASA: III  Anesthesia Plan: Regional and General   Post-op Pain Management:    Induction: Intravenous  Airway Management Planned: LMA  Additional Equipment: None  Intra-op Plan:   Post-operative Plan: Extubation in OR  Informed Consent: I have reviewed the patients History and Physical, chart, labs and discussed the procedure including the risks, benefits and alternatives for the proposed anesthesia with the patient or authorized representative who has indicated his/her understanding and acceptance.   Dental advisory given  Plan Discussed with: CRNA  Anesthesia Plan Comments:        Anesthesia Quick Evaluation

## 2014-08-02 NOTE — Anesthesia Procedure Notes (Addendum)
Anesthesia Regional Block:  Adductor canal block  Pre-Anesthetic Checklist: ,, timeout performed, Correct Patient, Correct Site, Correct Laterality, Correct Procedure, Correct Position, site marked, Risks and benefits discussed,  Surgical consent,  Pre-op evaluation,  At surgeon's request and post-op pain management  Laterality: Right  Prep: Betadine       Needles:  Injection technique: Single-shot  Needle Type: Stimulator Needle - 80      Needle Gauge: 22 and 22 G  Needle insertion depth: 6 cm   Additional Needles:  Procedures: ultrasound guided (picture in chart) Adductor canal block Narrative:   Performed by: Personally   Additional Notes: BP cuff, EKG monitors applied. Sedation begun. Artery located with U/S. Nerve localized, good perineural spread.   Procedure Name: LMA Insertion Date/Time: 08/02/2014 10:08 AM Performed by: Ollen Bowl Pre-anesthesia Checklist: Patient identified, Emergency Drugs available, Suction available, Patient being monitored and Timeout performed Patient Re-evaluated:Patient Re-evaluated prior to inductionOxygen Delivery Method: Circle system utilized and Simple face mask Preoxygenation: Pre-oxygenation with 100% oxygen Intubation Type: IV induction Ventilation: Mask ventilation without difficulty LMA: LMA inserted LMA Size: 5.0 Number of attempts: 1 Airway Equipment and Method: Patient positioned with wedge pillow Placement Confirmation: positive ETCO2 and breath sounds checked- equal and bilateral Tube secured with: Tape Dental Injury: Teeth and Oropharynx as per pre-operative assessment
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
## 2014-08-02 NOTE — Anesthesia Postprocedure Evaluation (Signed)
Anesthesia Post Note  Patient: Carol Vargas  Procedure(s) Performed: Procedure(s) (LRB): ARTHROSCOPY KNEE (Right) HARDWARE REMOVAL (Right)  Anesthesia type: General  Patient location: PACU  Post pain: Pain level controlled  Post assessment: Post-op Vital signs reviewed  Last Vitals: BP 147/95 mmHg  Pulse 91  Temp(Src) 36.4 C (Oral)  Resp 15  Ht 5\' 9"  (1.753 m)  Wt 261 lb (118.389 kg)  BMI 38.53 kg/m2  SpO2 94%  LMP 07/27/2014  Post vital signs: Reviewed  Level of consciousness: sedated  Complications: No apparent anesthesia complications

## 2014-08-02 NOTE — Progress Notes (Signed)
Orthopedic Tech Progress Note Patient Details:  Carol Vargas [birth year] [id number] Delivered to PACU. Patient unable to stand at this time. Crutches set for approximate height. Patient states she has used crutches before and feels comfortable with them. If patient needs crutches adjusted or more crutch training PACU nurse to call Ortho Tech back.  Ortho Devices Type of Ortho Device: Crutches Ortho Device/Splint Interventions: Ordered   [place] 08/02/2014, 12:25 PM

## 2014-08-03 ENCOUNTER — Encounter (HOSPITAL_COMMUNITY): Payer: Self-pay | Admitting: Orthopedic Surgery

## 2014-08-05 NOTE — Op Note (Signed)
Carol Vargas, BRECHT               ACCOUNT NO.:  [id number]  MEDICAL RECORD NO.:  [id number]  LOCATION:                                 FACILITY:  PHYSICIAN:  Estill Bamberg. Ronnie Derby, M.D. DATE OF BIRTH:  10-12-1975  DATE OF PROCEDURE:  08/02/2014 DATE OF DISCHARGE:  08/02/2014                              OPERATIVE REPORT   SURGEON:  Estill Bamberg. Ronnie Derby, M.D.  ASSISTANT:  Carlynn Spry, [facility]  ANESTHESIA:  General.  PREOPERATIVE DIAGNOSIS:  Left knee retained hardware posttraumatic osteoarthritis.  POSTOPERATIVE DIAGNOSIS:  Left knee retained hardware posttraumatic osteoarthritis.  PROCEDURE:  Right knee arthroscopy with chondromalacia debridement and open removal of hardware.  Informed consent obtained.  DESCRIPTION OF PROCEDURE:  The patient was laid supine, administered general anesthesia.  Right leg was prepped and draped in usual fashion. Inferolateral and inferomedial portals were created with a #11 blade, blunt trocar and cannula.  After debridement of all 3 compartments with grade 4 chondromalacia in the lateral compartment, I then removed the fluid and closed the portals with 4-0 nylon.  I then exsanguinated the extremity, elevated the tourniquet to 350 mmHg.  I made the proximal 3-4 cm of incision and the distal 3-4 cm of the incision with a tibial plateau fracture had been fixed.  Once I developed layered down to the fascia, then incised the fascia proximally and removed all 6 screws proximally.  I then did the same distally and used a periosteal elevator, connected to subperiosteally underneath to loosen the plate. I removed the 4 distal screws as well.  I then removed the plate through the proximal incision.  I then irrigated it copiously.  The tourniquet down to ensure hemostasis and closed with 0 and 2-0 Vicryl sutures. Dressed with Xeroform, dressing sponges, sterile Webril, Ace wrap.  COMPLICATIONS:  None.  DRAINS:  None.  TOURNIQUET TIME:  20 minutes.     ______________________________ Estill Bamberg Ronnie Derby, M.D.    SDL/MEDQ  D:  08/02/2014  T:  08/03/2014  Job:  [id number]

## 2014-08-17 ENCOUNTER — Ambulatory Visit (HOSPITAL_COMMUNITY)
Admission: RE | Admit: 2014-08-17 | Discharge: 2014-08-17 | Disposition: A | Discharge status: Home / Self Care | Payer: 59 | Source: Ambulatory Visit | Attending: Orthopedic Surgery | Admitting: Orthopedic Surgery

## 2014-08-17 ENCOUNTER — Encounter (HOSPITAL_COMMUNITY): Payer: Self-pay | Admitting: Physical Therapy

## 2014-08-17 DIAGNOSIS — M25661 Stiffness of right knee, not elsewhere classified: Secondary | ICD-10-CM | POA: Insufficient documentation

## 2014-08-17 DIAGNOSIS — M25561 Pain in right knee: Secondary | ICD-10-CM | POA: Diagnosis not present

## 2014-08-17 DIAGNOSIS — Z9889 Other specified postprocedural states: Secondary | ICD-10-CM

## 2014-08-17 DIAGNOSIS — M6281 Muscle weakness (generalized): Secondary | ICD-10-CM | POA: Insufficient documentation

## 2014-08-17 DIAGNOSIS — R269 Unspecified abnormalities of gait and mobility: Secondary | ICD-10-CM | POA: Diagnosis not present

## 2014-08-17 DIAGNOSIS — R29898 Other symptoms and signs involving the musculoskeletal system: Secondary | ICD-10-CM

## 2014-08-17 NOTE — Patient Instructions (Signed)
EXTENSION: Standing (Active)   Stand, both feet flat. Draw right leg behind body as far as possible.  Complete _2__ sets of _10__ repetitions. Perform _2__ sessions per day.  Hamstring Stretch   Place sound foot on the floor, keep back and residual limb knee straight. Lean forward until a stretch is felt in back of residual limb thigh. Hold __30__ seconds. Repeat __2__ times. Do _2___ sessions per day.  Copyright  VHI. All rights reserved.  Toe / Heel Raise   Gently rock back on heels and raise toes. Then rock forward on toes and raise heels. Repeat sequence __15__ times per session. Do __2__ sessions per day.  Copyright  VHI. All rights reserved.    Copyright  VHI. All rights reserved.  Hamstring Curl   Hold onto kitchen counter to assist with balance. Shift weight onto right leg, while bending left knee. Repeat, switching legs. Repeat _10__ times, alternating legs. Do _2__ times per day.  Copyright  VHI. All rights reserved.
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
## 2014-08-19 ENCOUNTER — Ambulatory Visit (HOSPITAL_COMMUNITY): Payer: 59

## 2014-08-24 ENCOUNTER — Ambulatory Visit (HOSPITAL_COMMUNITY)
Admission: RE | Admit: 2014-08-24 | Discharge: 2014-08-24 | Disposition: A | Discharge status: Home / Self Care | Payer: 59 | Source: Ambulatory Visit | Attending: Orthopedic Surgery | Admitting: Orthopedic Surgery

## 2014-08-24 DIAGNOSIS — M25661 Stiffness of right knee, not elsewhere classified: Secondary | ICD-10-CM | POA: Diagnosis not present

## 2014-08-24 DIAGNOSIS — Z9889 Other specified postprocedural states: Secondary | ICD-10-CM

## 2014-08-24 DIAGNOSIS — M25561 Pain in right knee: Secondary | ICD-10-CM

## 2014-08-24 DIAGNOSIS — R269 Unspecified abnormalities of gait and mobility: Secondary | ICD-10-CM

## 2014-08-24 DIAGNOSIS — R29898 Other symptoms and signs involving the musculoskeletal system: Secondary | ICD-10-CM

## 2014-08-24 NOTE — Patient Instructions (Addendum)
Piriformis (Supine)   Cross legs, right on top. Gently pull other knee toward chest until stretch is felt in buttock/hip of top leg. Hold 30 seconds. Repeat 3 times per set.  Do 1-2 sessions per day.  http://orth.exer.us/677   Copyright  VHI. All rights reserved.   Calf Stretch + Stand with hands supported on wall, elbows slightly bent, front knee bent, back knee straight, feet parallel and both heels on floor. Lean into wall by pushing hips forward until a stretch is felt in calf muscle. Hold 30  Seconds, 3 sets. Repeat with leg positions switched.  Copyright  VHI. All rights reserved.  Quad Sets   Slowly tighten thigh muscles of straight, left leg while counting out loud to 5 seconds Relax. Repeat 10-20 times. Do 1-2  sessions per day.  http://gt2.exer.us/293   Copyright  VHI. All rights reserved.  Knee / Archie Balboa on stomach, knees together. Grab one ankle with same side hand. Use towel if needed to reach. Gently pull foot toward buttock. Hold 30  seconds. Repeat with other leg. Repeat 3  times. Do 1-2  sessions per day.  Copyright  VHI. All rights reserved.

## 2014-08-24 NOTE — Addendum Note (Signed)
Encounter addended by: Hunt Oris, PT on: 08/24/2014 10:03 AM<BR>     Documentation filed: Orders
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
## 2014-08-26 ENCOUNTER — Encounter (HOSPITAL_COMMUNITY): Payer: 59

## 2014-08-31 ENCOUNTER — Ambulatory Visit (HOSPITAL_COMMUNITY): Payer: 59 | Admitting: Physical Therapy

## 2014-08-31 DIAGNOSIS — M25561 Pain in right knee: Secondary | ICD-10-CM

## 2014-08-31 DIAGNOSIS — Z9889 Other specified postprocedural states: Secondary | ICD-10-CM

## 2014-08-31 DIAGNOSIS — M25661 Stiffness of right knee, not elsewhere classified: Secondary | ICD-10-CM | POA: Diagnosis not present

## 2014-08-31 DIAGNOSIS — R269 Unspecified abnormalities of gait and mobility: Secondary | ICD-10-CM

## 2014-08-31 DIAGNOSIS — R29898 Other symptoms and signs involving the musculoskeletal system: Secondary | ICD-10-CM
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
## 2014-09-02 ENCOUNTER — Ambulatory Visit (HOSPITAL_COMMUNITY): Payer: 59 | Admitting: Physical Therapy

## 2014-09-03 ENCOUNTER — Ambulatory Visit (HOSPITAL_COMMUNITY): Payer: 59

## 2014-09-03 DIAGNOSIS — M25661 Stiffness of right knee, not elsewhere classified: Secondary | ICD-10-CM | POA: Diagnosis not present

## 2014-09-03 DIAGNOSIS — M25561 Pain in right knee: Secondary | ICD-10-CM

## 2014-09-03 DIAGNOSIS — R269 Unspecified abnormalities of gait and mobility: Secondary | ICD-10-CM

## 2014-09-03 DIAGNOSIS — R29898 Other symptoms and signs involving the musculoskeletal system: Secondary | ICD-10-CM

## 2014-09-03 DIAGNOSIS — Z9889 Other specified postprocedural states: Secondary | ICD-10-CM
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
## 2014-09-07 ENCOUNTER — Ambulatory Visit (HOSPITAL_COMMUNITY): Payer: 59 | Admitting: Physical Therapy

## 2014-09-09 ENCOUNTER — Encounter (HOSPITAL_COMMUNITY): Payer: 59

## 2014-09-14 ENCOUNTER — Ambulatory Visit (HOSPITAL_COMMUNITY): Payer: 59 | Admitting: Physical Therapy

## 2014-09-15 ENCOUNTER — Ambulatory Visit (HOSPITAL_COMMUNITY): Payer: 59 | Attending: Orthopedic Surgery | Admitting: Physical Therapy

## 2014-09-15 DIAGNOSIS — R269 Unspecified abnormalities of gait and mobility: Secondary | ICD-10-CM | POA: Diagnosis not present

## 2014-09-15 DIAGNOSIS — M25661 Stiffness of right knee, not elsewhere classified: Secondary | ICD-10-CM | POA: Diagnosis not present

## 2014-09-15 DIAGNOSIS — M25561 Pain in right knee: Secondary | ICD-10-CM | POA: Insufficient documentation

## 2014-09-15 DIAGNOSIS — M6281 Muscle weakness (generalized): Secondary | ICD-10-CM | POA: Diagnosis not present

## 2014-09-15 DIAGNOSIS — Z9889 Other specified postprocedural states: Secondary | ICD-10-CM

## 2014-09-15 DIAGNOSIS — R29898 Other symptoms and signs involving the musculoskeletal system: Secondary | ICD-10-CM
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
## 2014-09-16 ENCOUNTER — Ambulatory Visit (HOSPITAL_COMMUNITY): Payer: 59 | Admitting: Physical Therapy

## 2014-09-21 ENCOUNTER — Ambulatory Visit (HOSPITAL_COMMUNITY): Payer: 59 | Admitting: Physical Therapy

## 2014-09-23 ENCOUNTER — Ambulatory Visit (HOSPITAL_COMMUNITY): Payer: 59

## 2014-09-28 ENCOUNTER — Ambulatory Visit (HOSPITAL_COMMUNITY): Payer: 59 | Admitting: Physical Therapy

## 2014-12-09 ENCOUNTER — Other Ambulatory Visit: Payer: Self-pay | Admitting: Orthopedic Surgery

## 2014-12-21 NOTE — Therapy (Addendum)
[place], [place], [address] Phone: [phone]   Fax:  [phone]  Patient Details  Name: Carol Vargas MRN: [id number] Date of Birth: [birth year] Referring Provider:  Vickey Huger, MD  Encounter Date: 12/21/2014  PHYSICAL THERAPY DISCHARGE SUMMARY  Visits from Start of Care: 5  Current functional level related to goals / functional outcomes:  Patient has not returned since appointment on 09/15/14, unable to assess functional status   Remaining deficits:  Unable to assess remaining functional deficits, as patient has not returned since last session.   Education / Equipment: None  Plan: Patient agrees to discharge.  Patient goals were not met. Patient is being discharged due to not returning since the last visit.  ?????        Deniece Ree PT, DPT [facility] [address] [place], [place], [address] Phone: [phone]   Fax:  [phone]

## 2014-12-24 ENCOUNTER — Other Ambulatory Visit: Payer: Self-pay

## 2014-12-24 ENCOUNTER — Encounter (HOSPITAL_COMMUNITY): Payer: Self-pay

## 2014-12-24 ENCOUNTER — Encounter (HOSPITAL_COMMUNITY)
Admission: RE | Admit: 2014-12-24 | Discharge: 2014-12-24 | Disposition: A | Discharge status: Home / Self Care | Payer: 59 | Source: Ambulatory Visit | Attending: Orthopedic Surgery | Admitting: Orthopedic Surgery

## 2014-12-24 ENCOUNTER — Ambulatory Visit (HOSPITAL_COMMUNITY): Admission: RE | Admit: 2014-12-24 | Payer: 59 | Source: Ambulatory Visit

## 2014-12-24 DIAGNOSIS — Z0183 Encounter for blood typing: Secondary | ICD-10-CM | POA: Diagnosis not present

## 2014-12-24 DIAGNOSIS — Z01810 Encounter for preprocedural cardiovascular examination: Secondary | ICD-10-CM | POA: Insufficient documentation

## 2014-12-24 DIAGNOSIS — Z01818 Encounter for other preprocedural examination: Secondary | ICD-10-CM | POA: Diagnosis not present

## 2014-12-24 DIAGNOSIS — I10 Essential (primary) hypertension: Secondary | ICD-10-CM | POA: Insufficient documentation

## 2014-12-24 DIAGNOSIS — Z01812 Encounter for preprocedural laboratory examination: Secondary | ICD-10-CM | POA: Diagnosis not present

## 2014-12-24 HISTORY — DX: Unspecified osteoarthritis, unspecified site: M19.90

## 2014-12-24 LAB — URINE MICROSCOPIC-ADD ON

## 2014-12-24 LAB — CBC WITH DIFFERENTIAL/PLATELET
BASOS ABS: 0 10*3/uL (ref 0.0–0.1)
BASOS PCT: 1 % (ref 0–1)
Eosinophils Absolute: 0.1 10*3/uL (ref 0.0–0.7)
Eosinophils Relative: 2 % (ref 0–5)
HCT: 42 % (ref 36.0–46.0)
Hemoglobin: 14.2 g/dL (ref 12.0–15.0)
Lymphocytes Relative: 26 % (ref 12–46)
Lymphs Abs: 1.9 10*3/uL (ref 0.7–4.0)
MCH: 30.3 pg (ref 26.0–34.0)
MCHC: 33.8 g/dL (ref 30.0–36.0)
MCV: 89.6 fL (ref 78.0–100.0)
Monocytes Absolute: 0.5 10*3/uL (ref 0.1–1.0)
Monocytes Relative: 7 % (ref 3–12)
NEUTROS PCT: 64 % (ref 43–77)
Neutro Abs: 4.9 10*3/uL (ref 1.7–7.7)
Platelets: 208 10*3/uL (ref 150–400)
RBC: 4.69 MIL/uL (ref 3.87–5.11)
RDW: 12.9 % (ref 11.5–15.5)
WBC: 7.5 10*3/uL (ref 4.0–10.5)

## 2014-12-24 LAB — COMPREHENSIVE METABOLIC PANEL
ALT: 20 U/L (ref 14–54)
ANION GAP: 11 (ref 5–15)
AST: 20 U/L (ref 15–41)
Albumin: 3.7 g/dL (ref 3.5–5.0)
Alkaline Phosphatase: 74 U/L (ref 38–126)
BILIRUBIN TOTAL: 0.7 mg/dL (ref 0.3–1.2)
BUN: 12 mg/dL (ref 6–20)
CO2: 26 mmol/L (ref 22–32)
CREATININE: 0.93 mg/dL (ref 0.44–1.00)
Calcium: 9.4 mg/dL (ref 8.9–10.3)
Chloride: 103 mmol/L (ref 101–111)
GLUCOSE: 101 mg/dL — AB (ref 65–99)
Potassium: 3.7 mmol/L (ref 3.5–5.1)
Sodium: 140 mmol/L (ref 135–145)
TOTAL PROTEIN: 7.6 g/dL (ref 6.5–8.1)

## 2014-12-24 LAB — HCG, SERUM, QUALITATIVE: Preg, Serum: NEGATIVE

## 2014-12-24 LAB — URINALYSIS, ROUTINE W REFLEX MICROSCOPIC
Bilirubin Urine: NEGATIVE
Glucose, UA: NEGATIVE mg/dL
Ketones, ur: NEGATIVE mg/dL
Leukocytes, UA: NEGATIVE
Nitrite: NEGATIVE
PROTEIN: NEGATIVE mg/dL
SPECIFIC GRAVITY, URINE: 1.019 (ref 1.005–1.030)
Urobilinogen, UA: 0.2 mg/dL (ref 0.0–1.0)
pH: 5 (ref 5.0–8.0)

## 2014-12-24 LAB — TYPE AND SCREEN
ABO/RH(D): O POS
Antibody Screen: NEGATIVE

## 2014-12-24 LAB — SURGICAL PCR SCREEN
MRSA, PCR: NEGATIVE
STAPHYLOCOCCUS AUREUS: NEGATIVE

## 2014-12-24 LAB — ABO/RH: ABO/RH(D): O POS

## 2014-12-24 LAB — PROTIME-INR
INR: 1.02 (ref 0.00–1.49)
Prothrombin Time: 13.6 seconds (ref 11.6–15.2)

## 2014-12-24 LAB — APTT: aPTT: 34 seconds (ref 24–37)

## 2014-12-24 IMAGING — CR DG CHEST 2V
2 series · 2 of 2 positions shown · non-contrast
Comparison: 07/28/2013.

CLINICAL DATA: Hypertension.

EXAM:
CHEST  2 VIEW

[w chest pa]
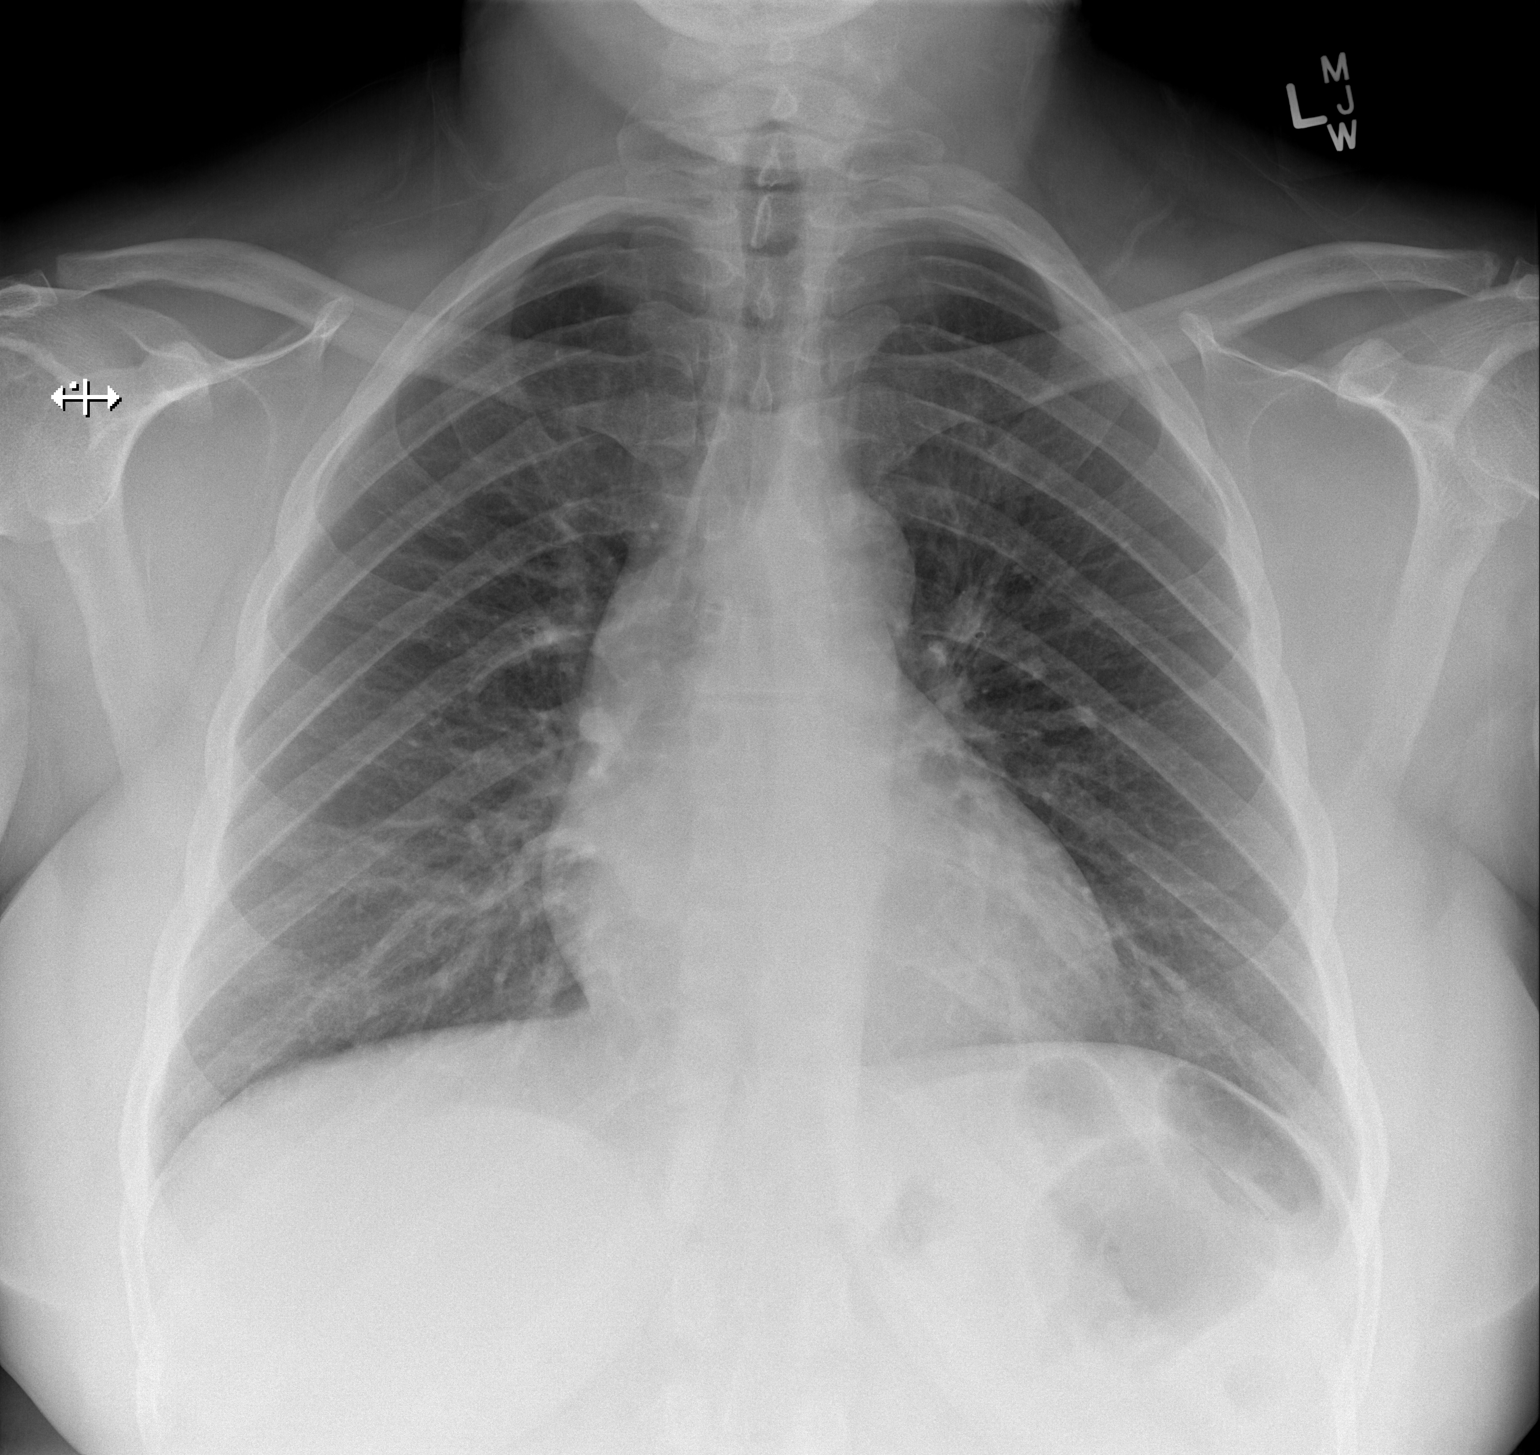

[w chest lat]
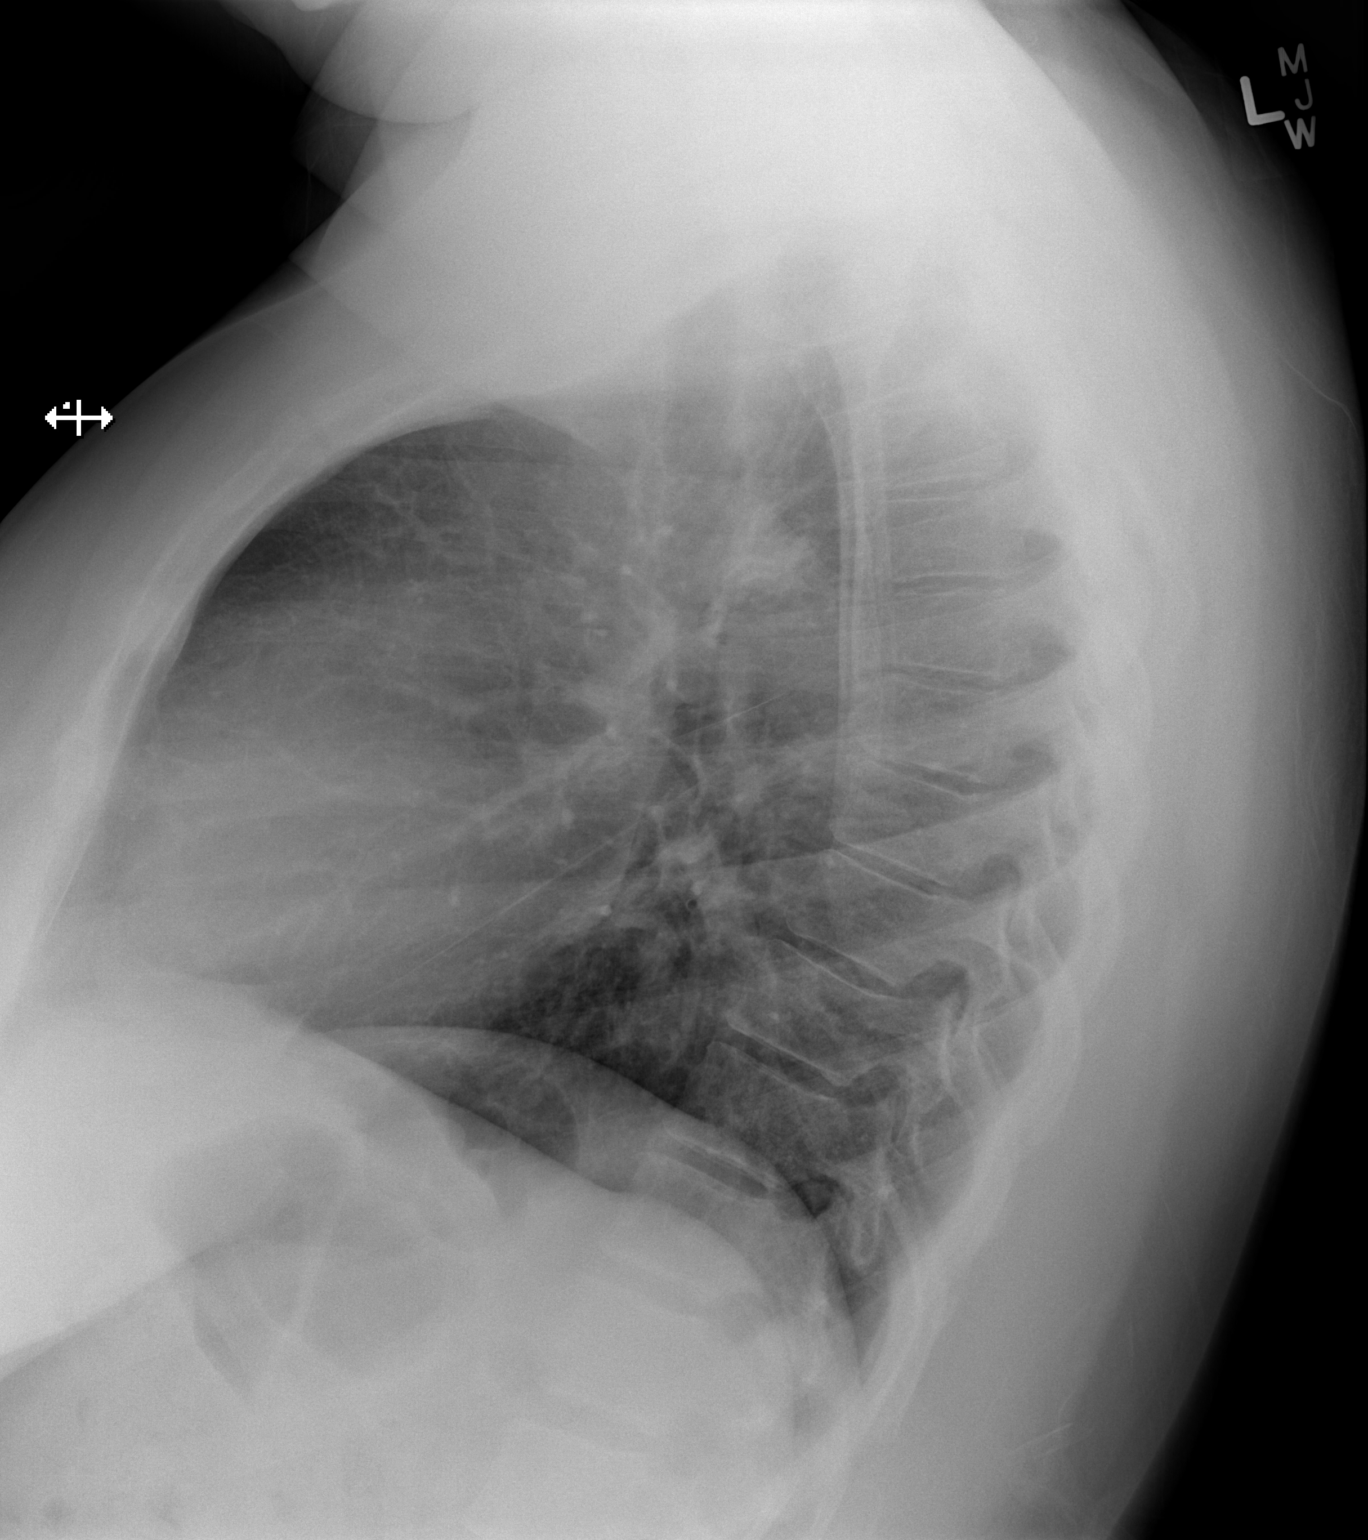

[2 of 2 positions shown; findings below may reference images not displayed]

FINDINGS: Mediastinum hilar structures are normal. Heart size stable. No focal
infiltrate. No pleural effusion or pneumothorax.
IMPRESSION: No acute cardiopulmonary disease.

## 2014-12-24 NOTE — Pre-Procedure Instructions (Addendum)
Carol Vargas  12/24/2014      [facility] [place], [place], [place] - [place] [address] Phone: [phone] Fax: [phone]    Your procedure is scheduled on 01/03/15  Report to [facility] short stay admitting at 914 A.M.  Call this number if you have problems the morning of surgery:  [phone]   Remember:  Do not eat food or drink liquids after midnight.  Take these medicines the morning of surgery with A SIP OF WATER  pain med if needed     STOP all herbel meds, nsaids (aleve,naproxen,advil,ibuprofen) 5 days prior to surgery starting 12/29/14 including vitamins, aspirin,    Do not wear jewelry, make-up or nail polish.  Do not wear lotions, powders, or perfumes.  You may wear deodorant.  Do not shave 48 hours prior to surgery.  Men may shave face and neck.  Do not bring valuables to the hospital.  [facility] is not responsible for any belongings or valuables.  Contacts, dentures or bridgework may not be worn into surgery.  Leave your suitcase in the car.  After surgery it may be brought to your room.  For patients admitted to the hospital, discharge time will be determined by your treatment team.  Patients discharged the day of surgery will not be allowed to drive home.   Name and phone number of your driver:    Special instructions:   Special Instructions: [place] - Preparing for Surgery  Before surgery, you can play an important role.  Because skin is not sterile, your skin needs to be as free of germs as possible.  You can reduce the number of germs on you skin by washing with CHG (chlorahexidine gluconate) soap before surgery.  CHG is an antiseptic cleaner which kills germs and bonds with the skin to continue killing germs even after washing.  Please DO NOT use if you have an allergy to CHG or antibacterial soaps.  If your skin becomes reddened/irritated stop using the CHG and inform your nurse when you arrive at Short  Stay.  Do not shave (including legs and underarms) for at least 48 hours prior to the first CHG shower.  You may shave your face.  Please follow these instructions carefully:   1.  Shower with CHG Soap the night before surgery and the morning of Surgery.  2.  If you choose to wash your hair, wash your hair first as usual with your normal shampoo.  3.  After you shampoo, rinse your hair and body thoroughly to remove the Shampoo.  4.  Use CHG as you would any other liquid soap.  You can apply chg directly  to the skin and wash gently with scrungie or a clean washcloth.  5.  Apply the CHG Soap to your body ONLY FROM THE NECK DOWN.  Do not use on open wounds or open sores.  Avoid contact with your eyes ears, mouth and genitals (private parts).  Wash genitals (private parts)       with your normal soap.  6.  Wash thoroughly, paying special attention to the area where your surgery will be performed.  7.  Thoroughly rinse your body with warm water from the neck down.  8.  DO NOT shower/wash with your normal soap after using and rinsing off the CHG Soap.  9.  Pat yourself dry with a clean towel.  10.  Wear clean pajamas.            11.  Place clean sheets on your bed the night of your first shower and do not sleep with pets.  Day of Surgery  Do not apply any lotions/deodorants the morning of surgery.  Please wear clean clothes to the hospital/surgery center.  Please read over the following fact sheets that you were given. Pain Booklet, Coughing and Deep Breathing, Blood Transfusion Information, Total Joint Packet, MRSA Information and Surgical Site Infection Prevention

## 2014-12-26 LAB — URINE CULTURE

## 2015-01-03 ENCOUNTER — Inpatient Hospital Stay (HOSPITAL_COMMUNITY)
Admission: RE | Admit: 2015-01-03 | Discharge: 2015-01-04 | DRG: 470 | Disposition: A | Discharge status: Home Health | Payer: 59 | Source: Ambulatory Visit | Attending: Orthopedic Surgery | Admitting: Orthopedic Surgery

## 2015-01-03 ENCOUNTER — Inpatient Hospital Stay (HOSPITAL_COMMUNITY): Payer: 59 | Admitting: Anesthesiology

## 2015-01-03 ENCOUNTER — Encounter (HOSPITAL_COMMUNITY): Payer: Self-pay | Admitting: Certified Registered Nurse Anesthetist

## 2015-01-03 ENCOUNTER — Encounter (HOSPITAL_COMMUNITY)
Admission: RE | Disposition: A | Discharge status: Home Health | Payer: Self-pay | Source: Ambulatory Visit | Attending: Orthopedic Surgery

## 2015-01-03 DIAGNOSIS — Z79899 Other long term (current) drug therapy: Secondary | ICD-10-CM

## 2015-01-03 DIAGNOSIS — D62 Acute posthemorrhagic anemia: Secondary | ICD-10-CM | POA: Diagnosis not present

## 2015-01-03 DIAGNOSIS — Z6841 Body Mass Index (BMI) 40.0 and over, adult: Secondary | ICD-10-CM | POA: Diagnosis not present

## 2015-01-03 DIAGNOSIS — Z8249 Family history of ischemic heart disease and other diseases of the circulatory system: Secondary | ICD-10-CM

## 2015-01-03 DIAGNOSIS — I10 Essential (primary) hypertension: Secondary | ICD-10-CM | POA: Diagnosis present

## 2015-01-03 DIAGNOSIS — Z96659 Presence of unspecified artificial knee joint: Secondary | ICD-10-CM

## 2015-01-03 DIAGNOSIS — M25561 Pain in right knee: Secondary | ICD-10-CM | POA: Diagnosis present

## 2015-01-03 DIAGNOSIS — Z823 Family history of stroke: Secondary | ICD-10-CM | POA: Diagnosis not present

## 2015-01-03 DIAGNOSIS — Z833 Family history of diabetes mellitus: Secondary | ICD-10-CM | POA: Diagnosis not present

## 2015-01-03 DIAGNOSIS — G4733 Obstructive sleep apnea (adult) (pediatric): Secondary | ICD-10-CM | POA: Diagnosis present

## 2015-01-03 DIAGNOSIS — Z87891 Personal history of nicotine dependence: Secondary | ICD-10-CM

## 2015-01-03 DIAGNOSIS — M12561 Traumatic arthropathy, right knee: Secondary | ICD-10-CM | POA: Diagnosis present

## 2015-01-03 HISTORY — PX: TOTAL KNEE ARTHROPLASTY: SHX125

## 2015-01-03 LAB — CREATININE, SERUM
CREATININE: 0.84 mg/dL (ref 0.44–1.00)
GFR calc Af Amer: >60 mL/min (ref >60)
GFR calc non Af Amer: >60 mL/min (ref >60)

## 2015-01-03 LAB — CBC
HEMATOCRIT: 40.1 % (ref 36.0–46.0)
HEMOGLOBIN: 13.9 g/dL (ref 12.0–15.0)
MCH: 30.8 pg (ref 26.0–34.0)
MCHC: 34.7 g/dL (ref 30.0–36.0)
MCV: 88.9 fL (ref 78.0–100.0)
Platelets: 10 10*3/uL — CL (ref 150–400)
RBC: 4.51 MIL/uL (ref 3.87–5.11)
RDW: 13.1 % (ref 11.5–15.5)
WBC: 7 10*3/uL (ref 4.0–10.5)

## 2015-01-03 LAB — PLATELET COUNT: Platelets: 165 10*3/uL (ref 150–400)

## 2015-01-03 SURGERY — ARTHROPLASTY, KNEE, TOTAL
Anesthesia: General | Site: Knee | Laterality: Right

## 2015-01-03 MED ORDER — OXYCODONE HCL 5 MG PO TABS
5.0000 mg | ORAL_TABLET | ORAL | Status: DC | PRN
  Administered 2015-01-03 – 2015-01-04 (×6): 10 mg via ORAL
  Administered 2015-01-04: 5 mg via ORAL
  Filled 2015-01-03 (×6): qty 2

## 2015-01-03 MED ORDER — ONDANSETRON HCL 4 MG/2ML IJ SOLN
INTRAMUSCULAR | Status: AC
  Filled 2015-01-03: qty 2

## 2015-01-03 MED ORDER — DOCUSATE SODIUM 100 MG PO CAPS
100.0000 mg | ORAL_CAPSULE | Freq: Two times a day (BID) | ORAL | Status: DC
  Administered 2015-01-03 – 2015-01-04 (×2): 100 mg via ORAL
  Filled 2015-01-03 (×2): qty 1

## 2015-01-03 MED ORDER — BUPIVACAINE-EPINEPHRINE 0.25% -1:200000 IJ SOLN
INTRAMUSCULAR | Status: DC | PRN
  Administered 2015-01-03: 30 mL

## 2015-01-03 MED ORDER — ACETAMINOPHEN 325 MG PO TABS
650.0000 mg | ORAL_TABLET | Freq: Four times a day (QID) | ORAL | Status: DC | PRN
Start: 2015-01-03 — End: 2015-01-04
  Administered 2015-01-04: 650 mg via ORAL
  Filled 2015-01-03: qty 2

## 2015-01-03 MED ORDER — METOCLOPRAMIDE HCL 5 MG PO TABS: 5.0000 mg | ORAL_TABLET | Freq: Three times a day (TID) | ORAL | Status: DC | PRN

## 2015-01-03 MED ORDER — OXYCODONE HCL 5 MG/5ML PO SOLN: 5.0000 mg | Freq: Once | ORAL | Status: DC | PRN

## 2015-01-03 MED ORDER — ROCURONIUM BROMIDE 100 MG/10ML IV SOLN
INTRAVENOUS | Status: DC | PRN
  Administered 2015-01-03: 50 mg via INTRAVENOUS

## 2015-01-03 MED ORDER — BUPIVACAINE LIPOSOME 1.3 % IJ SUSP
20.0000 mL | Freq: Once | INTRAMUSCULAR | Status: AC
  Administered 2015-01-03: 20 mL
  Filled 2015-01-03: qty 20

## 2015-01-03 MED ORDER — FENTANYL CITRATE (PF) 100 MCG/2ML IJ SOLN
INTRAMUSCULAR | Status: DC | PRN
  Administered 2015-01-03: 100 ug via INTRAVENOUS
  Administered 2015-01-03: 50 ug via INTRAVENOUS
  Administered 2015-01-03: 100 ug via INTRAVENOUS
  Administered 2015-01-03 (×5): 50 ug via INTRAVENOUS

## 2015-01-03 MED ORDER — OXYCODONE HCL 5 MG PO TABS
ORAL_TABLET | ORAL | Status: AC
  Filled 2015-01-03: qty 2

## 2015-01-03 MED ORDER — FENTANYL CITRATE (PF) 100 MCG/2ML IJ SOLN
INTRAMUSCULAR | Status: AC
  Filled 2015-01-03: qty 2

## 2015-01-03 MED ORDER — ENOXAPARIN SODIUM 30 MG/0.3ML ~~LOC~~ SOLN
30.0000 mg | Freq: Two times a day (BID) | SUBCUTANEOUS | Status: DC
  Administered 2015-01-04: 30 mg via SUBCUTANEOUS
  Filled 2015-01-03: qty 0.3

## 2015-01-03 MED ORDER — 0.9 % SODIUM CHLORIDE (POUR BTL) OPTIME
TOPICAL | Status: DC | PRN
  Administered 2015-01-03: 1000 mL

## 2015-01-03 MED ORDER — PROMETHAZINE HCL 25 MG/ML IJ SOLN: 6.2500 mg | INTRAMUSCULAR | Status: DC | PRN

## 2015-01-03 MED ORDER — CELECOXIB 200 MG PO CAPS
200.0000 mg | ORAL_CAPSULE | Freq: Two times a day (BID) | ORAL | Status: DC
  Administered 2015-01-03 – 2015-01-04 (×2): 200 mg via ORAL
  Filled 2015-01-03 (×2): qty 1

## 2015-01-03 MED ORDER — BISACODYL 5 MG PO TBEC: 5.0000 mg | DELAYED_RELEASE_TABLET | Freq: Every day | ORAL | Status: DC | PRN

## 2015-01-03 MED ORDER — DIPHENHYDRAMINE HCL 12.5 MG/5ML PO ELIX: 12.5000 mg | ORAL_SOLUTION | ORAL | Status: DC | PRN

## 2015-01-03 MED ORDER — LISINOPRIL 20 MG PO TABS
20.0000 mg | ORAL_TABLET | Freq: Two times a day (BID) | ORAL | Status: DC
  Administered 2015-01-03 – 2015-01-04 (×3): 20 mg via ORAL
  Filled 2015-01-03 (×3): qty 1

## 2015-01-03 MED ORDER — LISINOPRIL-HYDROCHLOROTHIAZIDE 20-12.5 MG PO TABS: 1.0000 | ORAL_TABLET | Freq: Two times a day (BID) | ORAL | Status: DC

## 2015-01-03 MED ORDER — LACTATED RINGERS IV SOLN
INTRAVENOUS | Status: DC
  Administered 2015-01-03 (×2): via INTRAVENOUS

## 2015-01-03 MED ORDER — OXYCODONE HCL ER 10 MG PO T12A
10.0000 mg | EXTENDED_RELEASE_TABLET | Freq: Two times a day (BID) | ORAL | Status: DC
  Administered 2015-01-03: 10 mg via ORAL
  Filled 2015-01-03: qty 1

## 2015-01-03 MED ORDER — NEOSTIGMINE METHYLSULFATE 10 MG/10ML IV SOLN
INTRAVENOUS | Status: DC | PRN
  Administered 2015-01-03: 4 mg via INTRAVENOUS

## 2015-01-03 MED ORDER — ACETAMINOPHEN 650 MG RE SUPP: 650.0000 mg | Freq: Four times a day (QID) | RECTAL | Status: DC | PRN

## 2015-01-03 MED ORDER — FLEET ENEMA 7-19 GM/118ML RE ENEM: 1.0000 | ENEMA | Freq: Once | RECTAL | Status: AC | PRN

## 2015-01-03 MED ORDER — SODIUM CHLORIDE 0.9 % IV SOLN
INTRAVENOUS | Status: DC
  Administered 2015-01-03 (×2): via INTRAVENOUS

## 2015-01-03 MED ORDER — SENNOSIDES-DOCUSATE SODIUM 8.6-50 MG PO TABS: 1.0000 | ORAL_TABLET | Freq: Every evening | ORAL | Status: DC | PRN

## 2015-01-03 MED ORDER — CEFAZOLIN SODIUM-DEXTROSE 2-3 GM-% IV SOLR
2.0000 g | Freq: Four times a day (QID) | INTRAVENOUS | Status: AC
  Administered 2015-01-03 (×2): 2 g via INTRAVENOUS
  Filled 2015-01-03 (×3): qty 50

## 2015-01-03 MED ORDER — PHENYLEPHRINE 40 MCG/ML (10ML) SYRINGE FOR IV PUSH (FOR BLOOD PRESSURE SUPPORT)
PREFILLED_SYRINGE | INTRAVENOUS | Status: AC
  Filled 2015-01-03: qty 10

## 2015-01-03 MED ORDER — TRANEXAMIC ACID 1000 MG/10ML IV SOLN
1000.0000 mg | INTRAVENOUS | Status: AC
  Administered 2015-01-03: 1000 mg via INTRAVENOUS
  Filled 2015-01-03: qty 10

## 2015-01-03 MED ORDER — ARTIFICIAL TEARS OP OINT
TOPICAL_OINTMENT | OPHTHALMIC | Status: AC
  Filled 2015-01-03: qty 3.5

## 2015-01-03 MED ORDER — CHLORHEXIDINE GLUCONATE 4 % EX LIQD: 60.0000 mL | Freq: Once | CUTANEOUS | Status: DC

## 2015-01-03 MED ORDER — METHOCARBAMOL 500 MG PO TABS
500.0000 mg | ORAL_TABLET | Freq: Four times a day (QID) | ORAL | Status: DC | PRN
  Administered 2015-01-03 – 2015-01-04 (×4): 500 mg via ORAL
  Filled 2015-01-03 (×3): qty 1

## 2015-01-03 MED ORDER — PHENYLEPHRINE HCL 10 MG/ML IJ SOLN
INTRAMUSCULAR | Status: DC | PRN
  Administered 2015-01-03: 80 ug via INTRAVENOUS

## 2015-01-03 MED ORDER — NEOSTIGMINE METHYLSULFATE 10 MG/10ML IV SOLN
INTRAVENOUS | Status: AC
  Filled 2015-01-03: qty 1

## 2015-01-03 MED ORDER — DEXTROSE 5 % IV SOLN: 500.0000 mg | Freq: Four times a day (QID) | INTRAVENOUS | Status: DC | PRN

## 2015-01-03 MED ORDER — LIDOCAINE HCL (CARDIAC) 20 MG/ML IV SOLN
INTRAVENOUS | Status: AC
  Filled 2015-01-03: qty 5

## 2015-01-03 MED ORDER — MIDAZOLAM HCL 5 MG/5ML IJ SOLN
INTRAMUSCULAR | Status: DC | PRN
  Administered 2015-01-03: 2 mg via INTRAVENOUS

## 2015-01-03 MED ORDER — MENTHOL 3 MG MT LOZG: 1.0000 | LOZENGE | OROMUCOSAL | Status: DC | PRN

## 2015-01-03 MED ORDER — ALUM & MAG HYDROXIDE-SIMETH 200-200-20 MG/5ML PO SUSP: 30.0000 mL | ORAL | Status: DC | PRN

## 2015-01-03 MED ORDER — BUPIVACAINE-EPINEPHRINE (PF) 0.5% -1:200000 IJ SOLN
INTRAMUSCULAR | Status: AC
  Filled 2015-01-03: qty 30

## 2015-01-03 MED ORDER — CEFAZOLIN SODIUM-DEXTROSE 2-3 GM-% IV SOLR
2.0000 g | INTRAVENOUS | Status: AC
  Administered 2015-01-03: 2 g via INTRAVENOUS
  Filled 2015-01-03: qty 50

## 2015-01-03 MED ORDER — ARTIFICIAL TEARS OP OINT
TOPICAL_OINTMENT | OPHTHALMIC | Status: DC | PRN
  Administered 2015-01-03: 1 via OPHTHALMIC

## 2015-01-03 MED ORDER — ONDANSETRON HCL 4 MG/2ML IJ SOLN: 4.0000 mg | Freq: Four times a day (QID) | INTRAMUSCULAR | Status: DC | PRN

## 2015-01-03 MED ORDER — ONDANSETRON HCL 4 MG PO TABS: 4.0000 mg | ORAL_TABLET | Freq: Four times a day (QID) | ORAL | Status: DC | PRN

## 2015-01-03 MED ORDER — HYDROCHLOROTHIAZIDE 12.5 MG PO CAPS
12.5000 mg | ORAL_CAPSULE | Freq: Two times a day (BID) | ORAL | Status: DC
  Administered 2015-01-03 – 2015-01-04 (×2): 12.5 mg via ORAL
  Filled 2015-01-03 (×2): qty 1

## 2015-01-03 MED ORDER — HYDROMORPHONE HCL 1 MG/ML IJ SOLN
INTRAMUSCULAR | Status: AC
  Filled 2015-01-03: qty 2

## 2015-01-03 MED ORDER — HYDROMORPHONE HCL 1 MG/ML IJ SOLN
1.0000 mg | INTRAMUSCULAR | Status: DC | PRN
  Administered 2015-01-03: 1 mg via INTRAVENOUS
  Filled 2015-01-03: qty 1

## 2015-01-03 MED ORDER — ROCURONIUM BROMIDE 50 MG/5ML IV SOLN
INTRAVENOUS | Status: AC
  Filled 2015-01-03: qty 1

## 2015-01-03 MED ORDER — METOCLOPRAMIDE HCL 5 MG/ML IJ SOLN: 5.0000 mg | Freq: Three times a day (TID) | INTRAMUSCULAR | Status: DC | PRN

## 2015-01-03 MED ORDER — METHOCARBAMOL 500 MG PO TABS
ORAL_TABLET | ORAL | Status: AC
  Filled 2015-01-03: qty 1

## 2015-01-03 MED ORDER — SODIUM CHLORIDE 0.9 % IV SOLN: INTRAVENOUS | Status: DC

## 2015-01-03 MED ORDER — GLYCOPYRROLATE 0.2 MG/ML IJ SOLN
INTRAMUSCULAR | Status: AC
  Filled 2015-01-03: qty 3

## 2015-01-03 MED ORDER — FENTANYL CITRATE (PF) 100 MCG/2ML IJ SOLN: 25.0000 ug | INTRAMUSCULAR | Status: DC | PRN

## 2015-01-03 MED ORDER — LIDOCAINE HCL (CARDIAC) 20 MG/ML IV SOLN
INTRAVENOUS | Status: DC | PRN
  Administered 2015-01-03: 60 mg via INTRAVENOUS

## 2015-01-03 MED ORDER — ZOLPIDEM TARTRATE 5 MG PO TABS: 5.0000 mg | ORAL_TABLET | Freq: Every evening | ORAL | Status: DC | PRN

## 2015-01-03 MED ORDER — MIDAZOLAM HCL 2 MG/2ML IJ SOLN
INTRAMUSCULAR | Status: AC
  Filled 2015-01-03: qty 2

## 2015-01-03 MED ORDER — FENTANYL CITRATE (PF) 250 MCG/5ML IJ SOLN
INTRAMUSCULAR | Status: AC
  Filled 2015-01-03: qty 5

## 2015-01-03 MED ORDER — PROPOFOL 10 MG/ML IV BOLUS
INTRAVENOUS | Status: DC | PRN
  Administered 2015-01-03: 200 mg via INTRAVENOUS

## 2015-01-03 MED ORDER — ONDANSETRON HCL 4 MG/2ML IJ SOLN
4.0000 mg | Freq: Four times a day (QID) | INTRAMUSCULAR | Status: DC | PRN
  Administered 2015-01-03: 4 mg via INTRAVENOUS
  Filled 2015-01-03: qty 2

## 2015-01-03 MED ORDER — ONDANSETRON HCL 4 MG/2ML IJ SOLN
INTRAMUSCULAR | Status: DC | PRN
  Administered 2015-01-03: 4 mg via INTRAVENOUS

## 2015-01-03 MED ORDER — GLYCOPYRROLATE 0.2 MG/ML IJ SOLN
INTRAMUSCULAR | Status: DC | PRN
  Administered 2015-01-03: 0.6 mg via INTRAVENOUS

## 2015-01-03 MED ORDER — PHENOL 1.4 % MT LIQD: 1.0000 | OROMUCOSAL | Status: DC | PRN

## 2015-01-03 MED ORDER — ATORVASTATIN CALCIUM 10 MG PO TABS
20.0000 mg | ORAL_TABLET | Freq: Every day | ORAL | Status: DC
  Administered 2015-01-03: 20 mg via ORAL
  Filled 2015-01-03: qty 2

## 2015-01-03 MED ORDER — OXYCODONE HCL 5 MG PO TABS: 5.0000 mg | ORAL_TABLET | Freq: Once | ORAL | Status: DC | PRN

## 2015-01-03 MED ORDER — HYDROMORPHONE HCL 1 MG/ML IJ SOLN
0.2500 mg | INTRAMUSCULAR | Status: DC | PRN
  Administered 2015-01-03 (×4): 0.5 mg via INTRAVENOUS

## 2015-01-03 SURGICAL SUPPLY — 73 items
BANDAGE ELASTIC 6 VELCRO ST LF (GAUZE/BANDAGES/DRESSINGS) ×2 IMPLANT
BANDAGE ESMARK 6X9 LF (GAUZE/BANDAGES/DRESSINGS) ×1 IMPLANT
BLADE SAGITTAL 13X1.27X60 (BLADE) ×2 IMPLANT
BLADE SAW SGTL 13X75X1.27 (BLADE) ×2 IMPLANT
BLADE SAW SGTL 83.5X18.5 (BLADE) ×2 IMPLANT
BLADE SAW SGTL NAR THIN XSHT (BLADE) ×2 IMPLANT
BLADE SURG 10 STRL SS (BLADE) ×8 IMPLANT
BNDG CMPR 9X6 STRL LF SNTH (GAUZE/BANDAGES/DRESSINGS) ×1
BNDG ESMARK 6X9 LF (GAUZE/BANDAGES/DRESSINGS) ×2
BOWL SMART MIX CTS (DISPOSABLE) IMPLANT
CAPT KNEE TOTAL 3 ×2 IMPLANT
CEMENT BONE SIMPLEX SPEEDSET (Cement) ×4 IMPLANT
COVER SURGICAL LIGHT HANDLE (MISCELLANEOUS) ×2 IMPLANT
CUFF TOURNIQUET SINGLE 34IN LL (TOURNIQUET CUFF) IMPLANT
DRAPE EXTREMITY T 121X128X90 (DRAPE) ×2 IMPLANT
DRAPE IMP U-DRAPE 54X76 (DRAPES) ×2 IMPLANT
DRAPE INCISE IOBAN 66X45 STRL (DRAPES) ×4 IMPLANT
DRAPE PROXIMA HALF (DRAPES) ×2 IMPLANT
DRAPE U-SHAPE 47X51 STRL (DRAPES) ×2 IMPLANT
DRSG ADAPTIC 3X8 NADH LF (GAUZE/BANDAGES/DRESSINGS) ×2 IMPLANT
DRSG PAD ABDOMINAL 8X10 ST (GAUZE/BANDAGES/DRESSINGS) ×4 IMPLANT
DURAPREP 26ML APPLICATOR (WOUND CARE) ×4 IMPLANT
ELECT REM PT RETURN 9FT ADLT (ELECTROSURGICAL) ×2
ELECTRODE REM PT RTRN 9FT ADLT (ELECTROSURGICAL) ×1 IMPLANT
EVACUATOR 1/8 PVC DRAIN (DRAIN) ×2 IMPLANT
FACESHIELD WRAPAROUND (MASK) ×2 IMPLANT
GAUZE SPONGE 4X4 12PLY STRL (GAUZE/BANDAGES/DRESSINGS) ×2 IMPLANT
GLOVE BIOGEL M 7.0 STRL (GLOVE) ×2 IMPLANT
GLOVE BIOGEL PI IND STRL 7.5 (GLOVE) IMPLANT
GLOVE BIOGEL PI IND STRL 8.5 (GLOVE) ×1 IMPLANT
GLOVE BIOGEL PI INDICATOR 7.5 (GLOVE)
GLOVE BIOGEL PI INDICATOR 8.5 (GLOVE) ×1
GLOVE BIOGEL PI ORTHO PRO SZ8 (GLOVE) ×1
GLOVE PI ORTHO PRO STRL SZ8 (GLOVE) ×1 IMPLANT
GLOVE SURG ORTHO 8.0 STRL STRW (GLOVE) ×6 IMPLANT
GOWN STRL REUS W/ TWL LRG LVL3 (GOWN DISPOSABLE) ×2 IMPLANT
GOWN STRL REUS W/ TWL XL LVL3 (GOWN DISPOSABLE) ×2 IMPLANT
GOWN STRL REUS W/TWL LRG LVL3 (GOWN DISPOSABLE) ×2
GOWN STRL REUS W/TWL XL LVL3 (GOWN DISPOSABLE) ×4
HANDPIECE INTERPULSE COAX TIP (DISPOSABLE)
HOOD PEEL AWAY FACE SHEILD DIS (HOOD) ×8 IMPLANT
KIT BASIN OR (CUSTOM PROCEDURE TRAY) ×2 IMPLANT
KIT ROOM TURNOVER OR (KITS) ×2 IMPLANT
KNEE CAPITATED TOTAL 3 ×1 IMPLANT
MANIFOLD NEPTUNE II (INSTRUMENTS) ×2 IMPLANT
NEEDLE 18GX1X1/2 (RX/OR ONLY) (NEEDLE) IMPLANT
NEEDLE 22X1 1/2 (OR ONLY) (NEEDLE) ×4 IMPLANT
NS IRRIG 1000ML POUR BTL (IV SOLUTION) ×2 IMPLANT
PACK TOTAL JOINT (CUSTOM PROCEDURE TRAY) ×2 IMPLANT
PACK UNIVERSAL I (CUSTOM PROCEDURE TRAY) ×2 IMPLANT
PAD ARMBOARD 7.5X6 YLW CONV (MISCELLANEOUS) ×4 IMPLANT
PADDING CAST COTTON 6X4 STRL (CAST SUPPLIES) ×2 IMPLANT
SET HNDPC FAN SPRY TIP SCT (DISPOSABLE) IMPLANT
SPONGE GAUZE 4X4 12PLY STER LF (GAUZE/BANDAGES/DRESSINGS) ×2 IMPLANT
STAPLER VISISTAT 35W (STAPLE) ×2 IMPLANT
SUCTION FRAZIER TIP 10 FR DISP (SUCTIONS) ×2 IMPLANT
SUT BONE WAX W31G (SUTURE) ×2 IMPLANT
SUT PDS AB 0 CT 36 (SUTURE) IMPLANT
SUT PDS AB 1 CT  36 (SUTURE)
SUT PDS AB 1 CT 36 (SUTURE) IMPLANT
SUT PDS AB 2-0 CT1 27 (SUTURE) IMPLANT
SUT VIC AB 0 CTB1 27 (SUTURE) IMPLANT
SUT VIC AB 1 CT1 27 (SUTURE)
SUT VIC AB 1 CT1 27XBRD ANBCTR (SUTURE) IMPLANT
SUT VIC AB 2-0 CTB1 (SUTURE) IMPLANT
SWAB COLLECTION DEVICE MRSA (MISCELLANEOUS) ×2 IMPLANT
SYR 20CC LL (SYRINGE) ×4 IMPLANT
SYR CONTROL 10ML LL (SYRINGE) ×4 IMPLANT
TOWEL OR 17X24 6PK STRL BLUE (TOWEL DISPOSABLE) ×2 IMPLANT
TOWEL OR 17X26 10 PK STRL BLUE (TOWEL DISPOSABLE) ×2 IMPLANT
TRAY FOLEY CATH 16FRSI W/METER (SET/KITS/TRAYS/PACK) IMPLANT
TUBE ANAEROBIC SPECIMEN COL (MISCELLANEOUS) IMPLANT
WATER STERILE IRR 1000ML POUR (IV SOLUTION) ×6 IMPLANT
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
## 2015-01-03 NOTE — Anesthesia Preprocedure Evaluation (Addendum)
Anesthesia Evaluation  Patient identified by MRN, date of birth, ID band Patient awake    Reviewed: Allergy & Precautions, NPO status , Patient's Chart, lab work & pertinent test results  Airway Mallampati: II   Neck ROM: full    Dental   Pulmonary sleep apnea , former smoker,  breath sounds clear to auscultation        Cardiovascular hypertension, Rhythm:regular Rate:Normal     Neuro/Psych  Headaches,    GI/Hepatic   Endo/Other  Morbid obesity  Renal/GU      Musculoskeletal  (+) Arthritis -,   Abdominal   Peds  Hematology   Anesthesia Other Findings   Reproductive/Obstetrics                            Anesthesia Physical Anesthesia Plan  ASA: II  Anesthesia Plan: General   Post-op Pain Management:    Induction: Intravenous  Airway Management Planned: Oral ETT  Additional Equipment:   Intra-op Plan:   Post-operative Plan: Extubation in OR  Informed Consent: I have reviewed the patients History and Physical, chart, labs and discussed the procedure including the risks, benefits and alternatives for the proposed anesthesia with the patient or authorized representative who has indicated his/her understanding and acceptance.     Plan Discussed with: CRNA, Anesthesiologist and Surgeon  Anesthesia Plan Comments:        Anesthesia Quick Evaluation

## 2015-01-03 NOTE — Anesthesia Procedure Notes (Signed)
Procedure Name: Intubation Date/Time: 01/03/2015 10:44 AM Performed by: Rogers Blocker Pre-anesthesia Checklist: Patient identified, Timeout performed, Emergency Drugs available, Suction available and Patient being monitored Patient Re-evaluated:Patient Re-evaluated prior to inductionOxygen Delivery Method: Circle system utilized Preoxygenation: Pre-oxygenation with 100% oxygen Intubation Type: IV induction Ventilation: Mask ventilation without difficulty Laryngoscope Size: Mac and 3 Grade View: Grade I Tube type: Oral Tube size: 7.5 mm Number of attempts: 1 Airway Equipment and Method: Stylet Placement Confirmation: ETT inserted through vocal cords under direct vision,  positive ETCO2,  CO2 detector and breath sounds checked- equal and bilateral Secured at: 21 cm Tube secured with: Tape Dental Injury: Teeth and Oropharynx as per pre-operative assessment

## 2015-01-03 NOTE — Anesthesia Postprocedure Evaluation (Signed)
  Anesthesia Post-op Note  Patient: Carol Vargas  Procedure(s) Performed: Procedure(s) with comments: TOTAL KNEE ARTHROPLASTY (Right) - former tibial plateau fracture orif with hardware removed 07/2014  Patient Location: PACU  Anesthesia Type:General  Level of Consciousness: awake, alert  and sedated  Airway and Oxygen Therapy: Patient Spontanous Breathing  Post-op Pain: mild  Post-op Assessment: Post-op Vital signs reviewed     RLE Motor Response: Purposeful movement, No tremor RLE Sensation: Full sensation      Post-op Vital Signs: stable  Last Vitals:  Filed Vitals:   01/03/15 1421  BP: 152/79  Pulse: 85  Temp: 36.7 C  Resp: 18    Complications: No apparent anesthesia complications

## 2015-01-03 NOTE — Op Note (Signed)
TOTAL KNEE REPLACEMENT OPERATIVE NOTE:  01/03/2015  2:19 PM  PATIENT:  Carol Vargas  39 y.o. female  PRE-OPERATIVE DIAGNOSIS:  post traumatic arthritis   POST-OPERATIVE DIAGNOSIS:  post traumatic arthritis   PROCEDURE:  Procedure(s): TOTAL KNEE ARTHROPLASTY  SURGEON:  Surgeon(s): Vickey Huger, MD  PHYSICIAN ASSISTANT: Carlynn Spry, [facility]  ANESTHESIA:   spinal  DRAINS: Hemovac  SPECIMEN: None  COUNTS:  Correct  TOURNIQUET:   Total Tourniquet Time Documented: Calf (Right) - 79 minutes Total: Calf (Right) - 79 minutes   DICTATION:  Indication for procedure:    The patient is a 39 y.o. female who has failed conservative treatment for post traumatic arthritis .  Informed consent was obtained prior to anesthesia. The risks versus benefits of the operation were explain and in a way the patient can, and did, understand.   On the implant demand matching protocol, this patient scored 8.  Therefore, this patient was not receive a polyethylene insert with vitamin E which is a high demand implant.  Description of procedure:     The patient was taken to the operating room and placed under anesthesia.  The patient was positioned in the usual fashion taking care that all body parts were adequately padded and/or protected.  I foley catheter was not placed.  A tourniquet was applied and the leg prepped and draped in the usual sterile fashion.  The extremity was exsanguinated with the esmarch and tourniquet inflated to 350 mmHg.  Pre-operative range of motion was normal.  The knee was in 5 degree of mild varus.  A midline incision approximately 6-7 inches long was made with a #10 blade.  A new blade was used to make a parapatellar arthrotomy going 2-3 cm into the quadriceps tendon, over the patella, and alongside the medial aspect of the patellar tendon.  A synovectomy was then performed with the #10 blade and forceps. I then elevated the deep MCL off the medial tibial metaphysis  subperiosteally around to the semimembranosus attachment.    I everted the patella and used calipers to measure patellar thickness.  I used the reamer to ream down to appropriate thickness to recreate the native thickness.  I then removed excess bone with the rongeur and sagittal saw.  I used the appropriately sized template and drilled the three lug holes.  I then put the trial in place and measured the thickness with the calipers to ensure recreation of the native thickness.  The trial was then removed and the patella subluxed and the knee brought into flexion.  A homan retractor was place to retract and protect the patella and lateral structures.  A Z-retractor was place medially to protect the medial structures.  The extra-medullary alignment system was used to make cut the tibial articular surface perpendicular to the anamotic axis of the tibia and in 3 degrees of posterior slope.  The cut surface and alignment jig was removed.  I then used the intramedullary alignment guide to make a 6 valgus cut on the distal femur.  I then marked out the epicondylar axis on the distal femur.  The posterior condylar axis measured 3 degrees.  I then used the anterior referencing sizer and measured the femur to be a size 8.  The 4-In-1 cutting block was screwed into place in external rotation matching the posterior condylar angle, making our cuts perpendicular to the epicondylar axis.  Anterior, posterior and chamfer cuts were made with the sagittal saw.  The cutting block and cut pieces were removed.  A lamina spreader was placed in 90 degrees of flexion.  The ACL, PCL, menisci, and posterior condylar osteophytes were removed.  A 16 mm spacer blocked was found to offer good flexion and extension gap balance after moderate in degree releasing.   The scoop retractor was then placed and the femoral finishing block was pinned in place.  The small sagittal saw was used as well as the lug drill to finish the femur.  The  block and cut surfaces were removed and the medullary canal hole filled with autograft bone from the cut pieces.  The tibia was delivered forward in deep flexion and external rotation.  A size D tray was selected and pinned into place centered on the medial 1/3 of the tibial tubercle.  The reamer and keel was used to prepare the tibia through the tray.    I then trialed with the size 8 femur, size D tibia, a 16 mm insert and the 32 patella.  I had excellent flexion/extension gap balance, excellent patella tracking.  Flexion was full and beyond 120 degrees; extension was zero.  These components were chosen and the staff opened them to me on the back table while the knee was lavaged copiously and the cement mixed.  The soft tissue was infiltrated with 60cc of exparel 1.3% through a 21 gauge needle.  I cemented in the components and removed all excess cement.  The polyethylene tibial component was snapped into place and the knee placed in extension while cement was hardening.  The capsule was infilltrated with 30cc of .25% Marcaine with epinephrine.  A hemovac was place in the joint exiting superolaterally.  A pain pump was place superomedially superficial to the arthrotomy.  Once the cement was hard, the tourniquet was let down.  Hemostasis was obtained.  The arthrotomy was closed with figure-8 #1 vicryl sutures.  The deep soft tissues were closed with #0 vicryls and the subcuticular layer closed with a running #2-0 vicryl.  The skin was reapproximated and closed with skin staples.  The wound was dressed with xeroform, 4 x4's, 2 ABD sponges, a single layer of webril and a TED stocking.   The patient was then awakened, extubated, and taken to the recovery room in stable condition.  BLOOD LOSS:  300cc DRAINS: 1 hemovac, 1 pain catheter COMPLICATIONS:  None.  PLAN OF CARE: Admit to inpatient   PATIENT DISPOSITION:  PACU - hemodynamically stable.   Delay start of Pharmacological VTE agent (>24hrs) due to  surgical blood loss or risk of bleeding:  not applicable  Please fax a copy of this op note to my office at [phone] (please only include page 1 and 2 of the Case Information op note)

## 2015-01-03 NOTE — Transfer of Care (Signed)
Immediate Anesthesia Transfer of Care Note  Patient: Carol Vargas  Procedure(s) Performed: Procedure(s) with comments: TOTAL KNEE ARTHROPLASTY (Right) - former tibial plateau fracture orif with hardware removed 07/2014  Patient Location: PACU  Anesthesia Type:General  Level of Consciousness: awake, alert , oriented and patient cooperative  Airway & Oxygen Therapy: Patient Spontanous Breathing and Patient connected to nasal cannula oxygen  Post-op Assessment: Report given to RN, Post -op Vital signs reviewed and stable and Patient moving all extremities X 4  Post vital signs: Reviewed and stable  Last Vitals:  Filed Vitals:   01/03/15 1136  BP:   Pulse:   Temp: 36.6 C  Resp:     Complications: No apparent anesthesia complications

## 2015-01-03 NOTE — Progress Notes (Signed)
Orthopedic Tech Progress Note Patient Details:  Carol Vargas March 29, 1976 [id number] CPM applied to RLE with appropriate settings. OHF applied to bed. Footsie roll provided. CPM Right Knee CPM Right Knee: On Right Knee Flexion (Degrees): 90 Right Knee Extension (Degrees): 0   Asia R Thompson 01/03/2015, 1:25 PM

## 2015-01-03 NOTE — Evaluation (Signed)
Physical Therapy Evaluation Patient Details Name: Carol Vargas MRN: [id number] DOB: [birth year] Today's Date: 01/03/2015   History of Present Illness  Patient is a 39 y/o female s/p R TKA -former tibial plateau fx ORIF with hardware removal 07/2014. PMH includes HTN, arthritis, HA.  Clinical Impression  Patient presents with nausea, lethargy, pain and post surgical deficits RLE impacting mobility. Nausea and pain are biggest limiting factors during today's session. Pt not able to tolerate zero degree knee or towel under right ankle. Able to get to EOB with Min A however needed to return to supine due to nausea. Pt plans to go home with parents at d/c. Will continue to follow to maximize independence and mobility prior to return home.     Follow Up Recommendations Home health PT;Supervision/Assistance - 24 hour    Equipment Recommendations  Rolling walker with 5" wheels    Recommendations for Other Services       Precautions / Restrictions Precautions Precautions: Fall;Knee Precaution Booklet Issued: No Precaution Comments: Reviewed no pillow under knee and zero degree knee.  Restrictions Weight Bearing Restrictions: Yes RLE Weight Bearing: Weight bearing as tolerated      Mobility  Bed Mobility Overal bed mobility: Needs Assistance Bed Mobility: Sit to Supine     Supine to sit: Min assist Sit to supine: Min assist   General bed mobility comments: Min A to bring RLE to EOB and into bed. Increased time. Lethargic. + nausea.   Transfers Overall transfer level:  (Declined OOB secondary to nausea/pain.)                  Ambulation/Gait                Stairs            Wheelchair Mobility    Modified Rankin (Stroke Patients Only)       Balance Overall balance assessment: Needs assistance Sitting-balance support: Feet supported;Bilateral upper extremity supported Sitting balance-Leahy Scale: Fair                                        Pertinent Vitals/Pain Pain Assessment: Faces Faces Pain Scale: Hurts whole lot Pain Location: right knee with movement.  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     Hand Dominance   Dominant Hand: Right    Extremity/Trunk Assessment   Upper Extremity Assessment: Defer to OT evaluation           Lower Extremity Assessment: RLE deficits/detail RLE Deficits / Details: Limited AROM/strength secondary to surgery and pain.        Communication   Communication: No difficulties  Cognition Arousal/Alertness:  (Eyes closed for most of session. ) Behavior During Therapy: WFL for tasks assessed/performed Overall Cognitive Status: Within Functional Limits for tasks assessed                      General Comments  General comments (skin integrity, edema, etc.): Pt's parents present in room during session.    Exercises Total Joint Exercises Ankle Circles/Pumps: Both;10 reps;Supine Quad Sets: Right;5 reps;Supine Gluteal Sets: Both;5 reps;Supine      Assessment/Plan    PT Assessment Patient needs continued PT services  PT Diagnosis Acute pain;Generalized weakness   PT Problem List Decreased strength;Pain;Decreased range of motion;Impaired sensation;Decreased activity tolerance;Decreased balance;Decreased mobility  PT Treatment Interventions Balance training;Gait  training;Functional mobility training;Therapeutic activities;Therapeutic exercise;Patient/family education;Stair training;DME instruction   PT Goals (Current goals can be found in the Care Plan section) Acute Rehab PT Goals Patient Stated Goal: pt unable. PT Goal Formulation: With patient Time For Goal Achievement: 01/17/15 Potential to Achieve Goals: Fair    Frequency 7X/week   Barriers to discharge        Co-evaluation               End of Session Equipment Utilized During Treatment: Gait belt Activity Tolerance: Patient limited by pain;Other (comment);Patient limited by lethargy (nausea.) Patient left: in bed;with call bell/phone within reach;with family/visitor present Nurse Communication: Mobility status         Time: 7001-7494 PT Time Calculation (min) (ACUTE ONLY): 29 min   Charges:   PT Evaluation $Initial PT Evaluation Tier I: 1 Procedure PT Treatments $Therapeutic Activity: 8-22 mins   PT G Codes:        Morty Ortwein A Avonna Iribe 01/03/2015, 4:35 PM Wray Kearns, [place], DPT [phone]

## 2015-01-03 NOTE — Plan of Care (Signed)
Problem: Consults Goal: Diagnosis- Total Joint Replacement Primary Total Knee Right     

## 2015-01-03 NOTE — Care Management (Signed)
Utilization review completed by Davonne Jarnigan N. Tanza Pellot, RN BSN 

## 2015-01-04 ENCOUNTER — Encounter (HOSPITAL_COMMUNITY): Payer: Self-pay | Admitting: Orthopedic Surgery

## 2015-01-04 LAB — CBC
HCT: 33.9 % — ABNORMAL LOW (ref 36.0–46.0)
Hemoglobin: 11.2 g/dL — ABNORMAL LOW (ref 12.0–15.0)
MCH: 29.9 pg (ref 26.0–34.0)
MCHC: 33 g/dL (ref 30.0–36.0)
MCV: 90.4 fL (ref 78.0–100.0)
PLATELETS: 186 10*3/uL (ref 150–400)
RBC: 3.75 MIL/uL — ABNORMAL LOW (ref 3.87–5.11)
RDW: 13.1 % (ref 11.5–15.5)
WBC: 11.2 10*3/uL — AB (ref 4.0–10.5)

## 2015-01-04 LAB — BASIC METABOLIC PANEL
Anion gap: 6 (ref 5–15)
BUN: 7 mg/dL (ref 6–20)
CHLORIDE: 102 mmol/L (ref 101–111)
CO2: 26 mmol/L (ref 22–32)
Calcium: 8 mg/dL — ABNORMAL LOW (ref 8.9–10.3)
Creatinine, Ser: 0.86 mg/dL (ref 0.44–1.00)
GFR calc non Af Amer: >60 mL/min (ref >60)
GLUCOSE: 121 mg/dL — AB (ref 65–99)
POTASSIUM: 3.1 mmol/L — AB (ref 3.5–5.1)
Sodium: 134 mmol/L — ABNORMAL LOW (ref 135–145)

## 2015-01-04 MED ORDER — METHOCARBAMOL 500 MG PO TABS: 500.0000 mg | ORAL_TABLET | Freq: Four times a day (QID) | ORAL | Status: DC | PRN

## 2015-01-04 MED ORDER — OXYCODONE HCL 5 MG PO TABS: 5.0000 mg | ORAL_TABLET | ORAL | Status: DC | PRN

## 2015-01-04 MED ORDER — OXYCODONE HCL ER 10 MG PO T12A: 10.0000 mg | EXTENDED_RELEASE_TABLET | Freq: Two times a day (BID) | ORAL | Status: DC

## 2015-01-04 MED ORDER — ENOXAPARIN SODIUM 40 MG/0.4ML ~~LOC~~ SOLN: 40.0000 mg | SUBCUTANEOUS | Status: DC

## 2015-01-04 NOTE — Progress Notes (Addendum)
Occupational Therapy Evaluation Patient Details Name: Carol Vargas MRN: [id number] DOB: [birth year] Today's Date: 01/04/2015    History of Present Illness Patient is a 39 y/o female s/p R TKA -former tibial plateau fx ORIF with hardware removal 07/2014. PMH includes HTN, arthritis, HA.   Clinical Impression   Pt admitted with the above diagnoses and presents with below problem list. Pt will benefit from continued acute OT to address the below listed deficits and maximize independence with BADLs prior to d/c home with family assisting. PTA pt was independent with ADLs. Pt is currently min guard level with ADLs. OT to continue to follow acutely.      Follow Up Recommendations  Supervision/Assistance - 24 hour;No OT follow up    Equipment Recommendations  3 in 1 bedside comode    Recommendations for Other Services       Precautions / Restrictions Precautions Precautions: Fall;Knee Precaution Booklet Issued: No Precaution Comments: Reviewed no pillow under knee and zero degree knee.  Restrictions Weight Bearing Restrictions: Yes RLE Weight Bearing: Weight bearing as tolerated      Mobility Bed Mobility               General bed mobility comments: not assessed  Transfers Overall transfer level: Needs assistance Equipment used: Rolling walker (2 wheeled) Transfers: Sit to/from Stand Sit to Stand: Min guard;From elevated surface         General transfer comment: from [facility]. Pt reluctant to weight bear on RLE.Reminded of WBAT status.    Balance Overall balance assessment: Needs assistance Sitting-balance support: No upper extremity supported;Feet supported Sitting balance-Leahy Scale: Good Sitting balance - Comments: Completed UB/LB bathing dressing in unsupported seating   Standing balance support: Bilateral upper extremity supported;During functional activity Standing balance-Leahy Scale: Fair Standing balance comment: rw for balance                            ADL Overall ADL's : Needs assistance/impaired Eating/Feeding: Set up;Sitting   Grooming: Set up;Sitting   Upper Body Bathing: Set up;Sitting   Lower Body Bathing: Min guard;Sit to/from stand Lower Body Bathing Details (indicate cue type and reason): donned LB dressing in sit<>stand Upper Body Dressing : Set up;Sitting   Lower Body Dressing: Min guard;Sit to/from stand Lower Body Dressing Details (indicate cue type and reason): donned in sit<>stand Toilet Transfer: Min guard;BSC;RW;Ambulation   Toileting- Clothing Manipulation and Hygiene: Min guard;Sit to/from stand   Tub/ Shower Transfer: Min guard;Ambulation;3 in 1;Rolling walker   Functional mobility during ADLs: Min guard;Rolling walker General ADL Comments: Pt completed UB/LB bathing/dressing in sit<>stand at min guard level. Pt then ambulated to recliner at min guard level. ADL education provided.      Vision     Perception     Praxis      Pertinent Vitals/Pain Pain Assessment: 0-10 Pain Score: 4  Pain Location: rt knee Pain Descriptors / Indicators: Aching;Sore Pain Intervention(s): Monitored during session;Repositioned     Hand Dominance Right   Extremity/Trunk Assessment Upper Extremity Assessment Upper Extremity Assessment: Overall WFL for tasks assessed   Lower Extremity Assessment Lower Extremity Assessment: Defer to PT evaluation       Communication Communication Communication: No difficulties   Cognition Arousal/Alertness: Awake/alert Behavior During Therapy: WFL for tasks assessed/performed Overall Cognitive Status: Within Functional Limits for tasks assessed                     General Comments  Exercises       Shoulder Instructions      Home Living Family/patient expects to be discharged to:: Private residence Living Arrangements: Parent Available Help at Discharge: Family;Available 24 hours/day Type of Home: House Home Access: Stairs to enter [employer] of Steps: 1 Entrance Stairs-Rails: None Home Layout: One level     Bathroom Shower/Tub: Occupational psychologist: Standard Bathroom Accessibility: Yes How Accessible: Accessible via walker Home Equipment: Shower seat (cane)          Prior Functioning/Environment Level of Independence: Independent        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    OT Diagnosis: Acute pain   OT Problem List: Impaired balance (sitting and/or standing);Decreased knowledge of use of DME or AE;Decreased knowledge of precautions;Pain   OT Treatment/Interventions: Self-care/ADL training;DME and/or AE instruction;Therapeutic activities;Patient/family education;Balance training    OT Goals(Current goals can be found in the care plan section) Acute Rehab OT Goals Patient Stated Goal: not stated OT Goal Formulation: With patient Time For Goal Achievement: 01/11/15 Potential to Achieve Goals: Good ADL Goals Pt Will Transfer to Toilet: with modified independence;ambulating (3n1 over toilet) Additional ADL Goal #1: Pt will complete walk-in shower transfer to 3n1 at supervision level.   OT Frequency: Min 2X/week   Barriers to D/C:            Co-evaluation              End of Session Equipment Utilized During Treatment: Rolling walker  Additional Comments: zero knee on  Activity Tolerance: Patient tolerated treatment well Patient left: in chair;with call bell/phone within reach;Other (comment) (in zero knee)   Time: 8590-9311 OT Time Calculation (min): 23 min Charges:  OT General Charges $OT Visit: 1 Procedure OT Evaluation $Initial OT Evaluation Tier I: 1 Procedure OT Treatments $Self Care/Home Management : 8-22 mins G-Codes:    Hortencia Pilar January 19, 2015, 9:54 AM

## 2015-01-04 NOTE — Plan of Care (Signed)
Problem: Consults Goal: Diagnosis- Total Joint Replacement Outcome: Completed/Met Date Met:  01/04/15 Primary Total Knee right

## 2015-01-04 NOTE — Progress Notes (Signed)
Discharge teaching and prescriptions given to pt, all questions answered. Peripheral IV removed. Equipment delivered to room and taken by family member at d/c. Wheeled out by NT.   Raquel James 01/04/2015 6:20 PM

## 2015-01-04 NOTE — Progress Notes (Signed)
Orthopedic Tech Progress Note Patient Details:  Carol Vargas [birth year] [id number] Patient placed in CPM CPM Right Knee CPM Right Knee: On Right Knee Flexion (Degrees): 80 Right Knee Extension (Degrees): 0 Additional Comments: zero knee on   [place] R Thompson 01/04/2015, 3:24 PM

## 2015-01-04 NOTE — Discharge Instructions (Signed)
INSTRUCTIONS AFTER JOINT REPLACEMENT   o Remove items at home which could result in a fall. This includes throw rugs or furniture in walking pathways o ICE to the affected joint every three hours while awake for 30 minutes at a time, for at least the first 3-5 days, and then as needed for pain and swelling.  Continue to use ice for pain and swelling. You may notice swelling that will progress down to the foot and ankle.  This is normal after surgery.  Elevate your leg when you are not up walking on it.   o Continue to use the breathing machine you got in the hospital (incentive spirometer) which will help keep your temperature down.  It is common for your temperature to cycle up and down following surgery, especially at night when you are not up moving around and exerting yourself.  The breathing machine keeps your lungs expanded and your temperature down.   DIET:  As you were doing prior to hospitalization, we recommend a well-balanced diet.  DRESSING / WOUND CARE / SHOWERING  May change dressing on Wednesday  ACTIVITY  o Increase activity slowly as tolerated, but follow the weight bearing instructions below.   o No driving for 6 weeks or until further direction given by your physician.  You cannot drive while taking narcotics.  o No lifting or carrying greater than 10 lbs. until further directed by your surgeon. o Avoid periods of inactivity such as sitting longer than an hour when not asleep. This helps prevent blood clots.  o You may return to work once you are authorized by your doctor.     WEIGHT BEARING   Weight bearing as tolerated with assist device (walker, cane, etc) as directed, use it as long as suggested by your surgeon or therapist, typically at least 4-6 weeks.   EXERCISES  Results after joint replacement surgery are often greatly improved when you follow the exercise, range of motion and muscle strengthening exercises prescribed by your doctor. Safety measures are also  important to protect the joint from further injury. Any time any of these exercises cause you to have increased pain or swelling, decrease what you are doing until you are comfortable again and then slowly increase them. If you have problems or questions, call your caregiver or physical therapist for advice.   Rehabilitation is important following a joint replacement. After just a few days of immobilization, the muscles of the leg can become weakened and shrink (atrophy).  These exercises are designed to build up the tone and strength of the thigh and leg muscles and to improve motion. Often times heat used for twenty to thirty minutes before working out will loosen up your tissues and help with improving the range of motion but do not use heat for the first two weeks following surgery (sometimes heat can increase post-operative swelling).   These exercises can be done on a training (exercise) mat, on the floor, on a table or on a bed. Use whatever works the best and is most comfortable for you.    Use music or television while you are exercising so that the exercises are a pleasant break in your day. This will make your life better with the exercises acting as a break in your routine that you can look forward to.   Perform all exercises about fifteen times, three times per day or as directed.  You should exercise both the operative leg and the other leg as well.  Exercises include:  Quad Sets - Tighten up the muscle on the front of the thigh (Quad) and hold for 5-10 seconds.   °• Straight Leg Raises - With your knee straight (if you were given a brace, keep it on), lift the leg to 60 degrees, hold for 3 seconds, and slowly lower the leg.  Perform this exercise against resistance later as your leg gets stronger.  °• Leg Slides: Lying on your back, slowly slide your foot toward your buttocks, bending your knee up off the floor (only go as far as is comfortable). Then slowly slide your foot back down until  your leg is flat on the floor again.  °• Angel Wings: Lying on your back spread your legs to the side as far apart as you can without causing discomfort.  °• Hamstring Strength:  Lying on your back, push your heel against the floor with your leg straight by tightening up the muscles of your buttocks.  Repeat, but this time bend your knee to a comfortable angle, and push your heel against the floor.  You may put a pillow under the heel to make it more comfortable if necessary.  ° °A rehabilitation program following joint replacement surgery can speed recovery and prevent re-injury in the future due to weakened muscles. Contact your doctor or a physical therapist for more information on knee rehabilitation.  ° ° °CONSTIPATION ° °Constipation is defined medically as fewer than three stools per week and severe constipation as less than one stool per week.  Even if you have a regular bowel pattern at home, your normal regimen is likely to be disrupted due to multiple reasons following surgery.  Combination of anesthesia, postoperative narcotics, change in appetite and fluid intake all can affect your bowels.  ° °YOU MUST use at least one of the following options; they are listed in order of increasing strength to get the job done.  They are all available over the counter, and you may need to use some, POSSIBLY even all of these options:   ° °Drink plenty of fluids (prune juice may be helpful) and high fiber foods °Colace 100 mg by mouth twice a day  °Senokot for constipation as directed and as needed Dulcolax (bisacodyl), take with full glass of water  °Miralax (polyethylene glycol) once or twice a day as needed. ° °If you have tried all these things and are unable to have a bowel movement in the first 3-4 days after surgery call either your surgeon or your primary doctor.   ° °If you experience loose stools or diarrhea, hold the medications until you stool forms back up.  If your symptoms do not get better within 1 week  or if they get worse, check with your doctor.  If you experience "the worst abdominal pain ever" or develop nausea or vomiting, please contact the office immediately for further recommendations for treatment. ° ° °ITCHING:  If you experience itching with your medications, try taking only a single pain pill, or even half a pain pill at a time.  You can also use Benadryl over the counter for itching or also to help with sleep.  ° °TED HOSE STOCKINGS:  Use stockings on both legs until for at least 2 weeks or as directed by physician office. They may be removed at night for sleeping. ° °MEDICATIONS:  See your medication summary on the “After Visit Summary” that nursing will review with you.  You may have some home medications which will be placed on hold until   you complete the course of blood thinner medication.  It is important for you to complete the blood thinner medication as prescribed. ° °PRECAUTIONS:  If you experience chest pain or shortness of breath - call 911 immediately for transfer to the hospital emergency department.  ° °If you develop a fever greater that 101 F, purulent drainage from wound, increased redness or drainage from wound, foul odor from the wound/dressing, or calf pain - CONTACT YOUR SURGEON.   °                                                °FOLLOW-UP APPOINTMENTS:  If you do not already have a post-op appointment, please call the office for an appointment to be seen by your surgeon.  Guidelines for how soon to be seen are listed in your “After Visit Summary”, but are typically between 1-4 weeks after surgery. ° °OTHER INSTRUCTIONS:  ° °Knee Replacement:  Do not place pillow under knee, focus on keeping the knee straight while resting. CPM instructions: 0-90 degrees, 2 hours in the morning, 2 hours in the afternoon, and 2 hours in the evening. Place foam block, curve side up under heel at all times except when in CPM or when walking.  DO NOT modify, tear, cut, or change the foam block in any  way. ° °MAKE SURE YOU:  °• Understand these instructions.  °• Get help right away if you are not doing well or get worse.  ° ° °Thank you for letting us be a part of your medical care team.  It is a privilege we respect greatly.  We hope these instructions will help you stay on track for a fast and full recovery!  ° °

## 2015-01-04 NOTE — Progress Notes (Signed)
SPORTS MEDICINE AND JOINT REPLACEMENT  Lara Mulch, MD   Carlynn Spry, PA-C [address], [place], [place]  [address]                             [phone]   PROGRESS NOTE  Subjective:  negative for Chest Pain  negative for Shortness of Breath  negative for Nausea/Vomiting   negative for Calf Pain  negative for Bowel Movement   Tolerating Diet: yes         Patient reports pain as 5 on 0-10 scale.    Objective: Vital signs in last 24 hours:   Patient Vitals for the past 24 hrs:  BP Temp Temp src Pulse Resp SpO2  01/04/15 0515 123/74 mmHg 99.5 F (37.5 C) Oral 68 18 96 %  01/04/15 0027 134/70 mmHg 98.6 F (37 C) Oral 80 18 97 %  01/03/15 2104 129/81 mmHg 98 F (36.7 C) Oral - - -  01/03/15 2008 120/85 mmHg - - 93 - -  01/03/15 1957 (!) 90/58 mmHg 99.5 F (37.5 C) Oral 73 18 97 %  01/03/15 1421 (!) 152/79 mmHg 98 F (36.7 C) - 85 18 100 %  01/03/15 1410 - - - 81 15 100 %  01/03/15 1403 (!) 152/96 mmHg - - - - -  01/03/15 1356 - - - 80 12 100 %  01/03/15 1348 (!) 141/81 mmHg - - - - -  01/03/15 1345 - - - 64 14 100 %  01/03/15 1333 (!) 141/90 mmHg - - - - -  01/03/15 1330 - - - 79 14 100 %  01/03/15 1318 (!) 151/92 mmHg - - - - -  01/03/15 1315 - - - 85 15 100 %  01/03/15 1312 - 98.2 F (36.8 C) - - - -  01/03/15 1303 (!) 145/81 mmHg - - - - -    @flow {1959:LAST@   Intake/Output from previous day:   06/27 0701 - 06/28 0700 In: 3693.8 [P.O.:1260; I.V.:2333.8] Out: 680 [Drains:630]   Intake/Output this shift:   06/28 0701 - 06/28 1900 In: 262.5 [I.V.:262.5] Out: -    Intake/Output      06/27 0701 - 06/28 0700 06/28 0701 - 06/29 0700   P.O. 1260    I.V. (mL/kg) 2333.8 (19.2) 262.5 (2.2)   IV Piggyback 100    Total Intake(mL/kg) 3693.8 (30.4) 262.5 (2.2)   Urine (mL/kg/hr) 0    Drains 630    Blood 50    Total Output 680     Net +3013.8 +262.5        Urine Occurrence 4 x 1 x      LABORATORY DATA:  Recent Labs  01/03/15 1608  01/03/15 1902 01/04/15 0808  WBC 7.0  --  11.2*  HGB 13.9  --  11.2*  HCT 40.1  --  33.9*  PLT 10* 165 186    Recent Labs  01/03/15 1608 01/04/15 0808  NA  --  134*  K  --  3.1*  CL  --  102  CO2  --  26  BUN  --  7  CREATININE 0.84 0.86  GLUCOSE  --  121*  CALCIUM  --  8.0*   Lab Results  Component Value Date   INR 1.02 12/24/2014   INR 1.04 07/28/2013   INR 0.96 07/21/2013    Examination:  General appearance: alert, cooperative and no distress Extremities: Homans sign is negative, no sign  of DVT  Wound Exam: clean, dry, intact   Drainage:  None: wound tissue dry  Motor Exam: EHL and FHL Intact  Sensory Exam: Deep Peroneal normal   Assessment:    1 Day Post-Op  Procedure(s) (LRB): TOTAL KNEE ARTHROPLASTY (Right)  ADDITIONAL DIAGNOSIS:  Active Problems:   S/P total knee arthroplasty  Acute Blood Loss Anemia   Plan: Physical Therapy as ordered Weight Bearing as Tolerated (WBAT)  DVT Prophylaxis:  Lovenox  DISCHARGE PLAN: Home  DISCHARGE NEEDS: HHPT, CPM, Walker and 3-in-1 comode seat         Zoeie Ritter 01/04/2015, 11:53 AM

## 2015-01-04 NOTE — Progress Notes (Signed)
Physical Therapy Treatment Patient Details Name: Carol Vargas MRN: [id number] DOB: Aug 01, 1975 Today's Date: 01/04/2015    History of Present Illness Patient is a 39 y/o female s/p R TKA -former tibial plateau fx ORIF with hardware removal 07/2014. PMH includes HTN, arthritis, HA.    PT Comments    Patient progressing well towards PT goals. Tolerated stair training and gait training this PM with Min guard-S for safety. Reviewed exercises. Discussed car transport home. Pt safe to discharge from a mobility standpoint. Will continue to follow if still in hospital tomorrow to maximize independence.   Follow Up Recommendations  Home health PT;Supervision/Assistance - 24 hour     Equipment Recommendations  Rolling walker with 5" wheels    Recommendations for Other Services       Precautions / Restrictions Precautions Precautions: Fall;Knee Precaution Booklet Issued: Yes (comment) Precaution Comments: Reviewed no pillow under knee and zero degree knee.  Restrictions Weight Bearing Restrictions: Yes RLE Weight Bearing: Weight bearing as tolerated    Mobility  Bed Mobility Overal bed mobility: Needs Assistance Bed Mobility: Supine to Sit;Sit to Supine     Supine to sit: Modified independent (Device/Increase time) Sit to supine: Modified independent (Device/Increase time)   General bed mobility comments: Pt in bathroom upon PT arrival.   Transfers Overall transfer level: Needs assistance Equipment used: Rolling walker (2 wheeled) Transfers: Sit to/from Stand Sit to Stand: Supervision         General transfer comment: Supervision for safety. Stood from toilet x1, from chair x2.   Ambulation/Gait Ambulation/Gait assistance: Supervision Ambulation Distance (Feet): 100 Feet Assistive device: Rolling walker (2 wheeled) Gait Pattern/deviations: Decreased stance time - right;Decreased step length - left;Step-through pattern;Step-to pattern;Trunk flexed;Antalgic   Gait  velocity interpretation: Below normal speed for age/gender General Gait Details: Cues for knee extension during stance phase for quad activation.    Stairs Stairs: Yes Stairs assistance: Min guard Stair Management: Step to pattern;Backwards;With walker Number of Stairs: 1 General stair comments: Cues for technique.  Wheelchair Mobility    Modified Rankin (Stroke Patients Only)       Balance Overall balance assessment: Needs assistance Sitting-balance support: Feet supported;No upper extremity supported Sitting balance-Leahy Scale: Good     Standing balance support: During functional activity Standing balance-Leahy Scale: Fair                      Cognition Arousal/Alertness: Awake/alert Behavior During Therapy: WFL for tasks assessed/performed Overall Cognitive Status: Within Functional Limits for tasks assessed                      Exercises     General Comments        Pertinent Vitals/Pain Pain Assessment: Faces Faces Pain Scale: Hurts a little bit Pain Location: right knee Pain Descriptors / Indicators: Sore Pain Intervention(s): Monitored during session;Repositioned    Home Living                      Prior Function            PT Goals (current goals can now be found in the care plan section) Progress towards PT goals: Progressing toward goals    Frequency  7X/week    PT Plan Current plan remains appropriate    Co-evaluation             End of Session Equipment Utilized During Treatment: Gait belt Activity Tolerance: Patient tolerated treatment well Patient left: in  chair;with call bell/phone within reach     Time: 1340-1358 PT Time Calculation (min) (ACUTE ONLY): 18 min  Charges:  $Gait Training: 8-22 mins                     G Codes:      Carol Vargas A Carol Vargas 01/04/2015, 2:56 PM Carol Vargas, [place], DPT [phone]

## 2015-01-04 NOTE — Care Management Note (Signed)
Case Management Note  Patient Details  Name: Carol Vargas MRN: [id number] Date of Birth: 06/03/1976  Subjective/Objective:              S/p right total knee arthroplasty      Action/Plan: Spoke with patient, she stated that MD office had set her up with [facility]. Patient stated that her mother and husband will be available after discharge to assist her. [employer] delivered 3N1 and rolling walker to patient's room and will deliver CPM to home. Contacted Farrah at [employer] and confirmed that they will be providing  HHPT,service to start day after d/c.   Expected Discharge Date:                  Expected Discharge Plan:  [facility]  In-House Referral:  NA  Discharge planning Services  CM Consult  Post Acute Care Choice:  Home Health, Durable Medical Equipment Choice offered to:  Patient  DME Arranged:  3-N-1, CPM, Walker rolling DME Agency:  [employer]  HH Arranged:  PT HH Agency:  [facility]  Status of Service:  Completed, signed off  Medicare Important Message Given:    Date Medicare IM Given:    Medicare IM give by:    Date Additional Medicare IM Given:    Additional Medicare Important Message give by:     If discussed at [place] of Stay Meetings, dates discussed:    Additional Comments:  Nila Nephew, RN 01/04/2015, 10:37 AM

## 2015-01-04 NOTE — Progress Notes (Signed)
Physical Therapy Treatment Patient Details Name: Carol Vargas MRN: [id number] DOB: [birth year] Today's Date: 01/04/2015    History of Present Illness Patient is a 39 y/o female s/p R TKA -former tibial plateau fx ORIF with hardware removal 07/2014. PMH includes HTN, arthritis, HA.    PT Comments    Patient progressing well towards PT goals. Tolerated ambulating 34' with Supervision for safety. Instructed pt in exercises and reviewed handout. Will plan for stair training in PM session as pt plans to d/c later today. Will continue to follow to maximize independence and mobility prior to return home.   Follow Up Recommendations  Home health PT;Supervision/Assistance - 24 hour     Equipment Recommendations  Rolling walker with 5" wheels    Recommendations for Other Services       Precautions / Restrictions Precautions Precautions: Fall;Knee Precaution Booklet Issued: Yes (comment) Precaution Comments: Reviewed no pillow under knee and zero degree knee.  Restrictions Weight Bearing Restrictions: Yes RLE Weight Bearing: Weight bearing as tolerated    Mobility  Bed Mobility Overal bed mobility: Needs Assistance Bed Mobility: Supine to Sit;Sit to Supine     Supine to sit: Modified independent (Device/Increase time) Sit to supine: Modified independent (Device/Increase time)   General bed mobility comments: HOB flat, no use of rails to simulate home environment. Instructed pt on ways to bring RLE to EOB and back into bed - use sheet.  Transfers Overall transfer level: Needs assistance Equipment used: Rolling walker (2 wheeled) Transfers: Sit to/from Stand Sit to Stand: Supervision         General transfer comment: Supervision for safety.   Ambulation/Gait Ambulation/Gait assistance: Supervision Ambulation Distance (Feet): 75 Feet Assistive device: Rolling walker (2 wheeled) Gait Pattern/deviations: Decreased stance time - right;Decreased step length - left;Step-to  pattern;Step-through pattern;Trunk flexed   Gait velocity interpretation: Below normal speed for age/gender General Gait Details: Cues for knee extension during stance phase for quad activation.    Stairs            Wheelchair Mobility    Modified Rankin (Stroke Patients Only)       Balance Overall balance assessment: Needs assistance Sitting-balance support: Feet supported;No upper extremity supported Sitting balance-Leahy Scale: Good Sitting balance - Comments: Completed UB/LB bathing dressing in unsupported seating   Standing balance support: During functional activity Standing balance-Leahy Scale: Fair Standing balance comment: rw for balance                    Cognition Arousal/Alertness: Awake/alert Behavior During Therapy: WFL for tasks assessed/performed Overall Cognitive Status: Within Functional Limits for tasks assessed                      Exercises Total Joint Exercises Ankle Circles/Pumps: Both;15 reps;Supine Quad Sets: Both;10 reps;Supine Towel Squeeze: Both;10 reps;Seated Short Arc Quad: Right;AAROM;10 reps;Supine Heel Slides: Right;10 reps;Supine Hip ABduction/ADduction: AAROM;Right;10 reps;Supine Goniometric ROM: 1-75 degrees knee AROM.    General Comments        Pertinent Vitals/Pain Pain Assessment: Faces Pain Score: 4  Faces Pain Scale: Hurts little more Pain Location: right knee Pain Descriptors / Indicators: Sore;Aching Pain Intervention(s): Monitored during session;Repositioned;Premedicated before session    Home Living Family/patient expects to be discharged to:: Private residence Living Arrangements: Parent Available Help at Discharge: Family;Available 24 hours/day Type of Home: House Home Access: Stairs to enter Entrance Stairs-Rails: None Home Layout: One level Home Equipment: Shower seat (cane)      Prior Function Level of Independence:  Independent      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    PT Goals (current goals can now be found in the care plan section) Acute Rehab PT Goals Patient Stated Goal: not stated Progress towards PT goals: Progressing toward goals    Frequency  7X/week    PT Plan Current plan remains appropriate    Co-evaluation             End of Session Equipment Utilized During Treatment: Gait belt Activity Tolerance: Patient tolerated treatment well Patient left: in bed;with call bell/phone within reach     Time: 1040-1103 PT Time Calculation (min) (ACUTE ONLY): 23 min  Charges:  $Gait Training: 8-22 mins $Therapeutic Exercise: 8-22 mins                    G Codes:      Alijah Akram A Onesti Bonfiglio 01/04/2015, 11:47 AM Wray Kearns, [place], DPT [phone]

## 2015-01-19 ENCOUNTER — Ambulatory Visit (HOSPITAL_COMMUNITY): Payer: 59 | Attending: Orthopedic Surgery | Admitting: Physical Therapy

## 2015-01-19 DIAGNOSIS — M6289 Other specified disorders of muscle: Secondary | ICD-10-CM | POA: Insufficient documentation

## 2015-01-19 DIAGNOSIS — Z9889 Other specified postprocedural states: Secondary | ICD-10-CM | POA: Diagnosis present

## 2015-01-19 DIAGNOSIS — R269 Unspecified abnormalities of gait and mobility: Secondary | ICD-10-CM | POA: Insufficient documentation

## 2015-01-19 DIAGNOSIS — M25561 Pain in right knee: Secondary | ICD-10-CM | POA: Insufficient documentation

## 2015-01-19 DIAGNOSIS — M25661 Stiffness of right knee, not elsewhere classified: Secondary | ICD-10-CM | POA: Insufficient documentation

## 2015-01-19 DIAGNOSIS — R29898 Other symptoms and signs involving the musculoskeletal system: Secondary | ICD-10-CM | POA: Diagnosis present

## 2015-01-19 NOTE — Patient Instructions (Signed)
Strengthening: Quadriceps Set   Tighten muscles on top of thighs by pushing knees down into surface. Hold __3__ seconds. Repeat __10__ times per set. Do _1___ sets per session. Do ___3_ sessions per day.  http://orth.exer.us/602   Copyright  VHI. All rights reserved.  Straight Leg Raise (Prone)   Abdomen and head supported, keep left knee locked and raise leg at hip. Avoid arching low back. Repeat 10____ times per set. Do _1___ sets per session. Do __3__ sessions per day.  http://orth.exer.us/1112   Copyright  VHI. All rights reserved.  Knee Extension (Sitting)   Place __0__ pound weight on left ankle and straighten knee fully, lower slowly. Repeat ___10-15_ times per set. Do __1__ sets per session. Do ____3 sessions per day.  http://orth.exer.us/732   Copyright  VHI. All rights reserved.  Self-Mobilization: Heel Slide (Supine)   Slide left heel toward buttocks until a gentle stretch is felt. Hold __3__ seconds. Relax. Repeat _10___ times per set. Do _1___ sets per session. Do _3___ sessions per day.  http://orth.exer.us/710   Copyright  VHI. All rights reserved.  Knee Extension Mobilization: Towel Prop   With rolled towel under right ankle, place __3__ pound weight across knee. Hold _5-20___ minutes. Repeat 1____ times per set. Do ___1_ sets per session. Do ___2_ sessions per day.  http://orth.exer.us/720   Copyright  VHI. All rights reserved.
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
## 2015-01-21 ENCOUNTER — Ambulatory Visit (HOSPITAL_COMMUNITY): Payer: 59 | Admitting: Physical Therapy

## 2015-01-21 DIAGNOSIS — M25561 Pain in right knee: Secondary | ICD-10-CM | POA: Diagnosis not present

## 2015-01-21 DIAGNOSIS — R29898 Other symptoms and signs involving the musculoskeletal system: Secondary | ICD-10-CM

## 2015-01-21 DIAGNOSIS — M25661 Stiffness of right knee, not elsewhere classified: Secondary | ICD-10-CM

## 2015-01-21 DIAGNOSIS — R269 Unspecified abnormalities of gait and mobility: Secondary | ICD-10-CM
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
## 2015-01-24 ENCOUNTER — Ambulatory Visit (HOSPITAL_COMMUNITY): Payer: 59 | Admitting: Physical Therapy

## 2015-01-24 NOTE — Discharge Summary (Signed)
SPORTS MEDICINE & JOINT REPLACEMENT   Lara Mulch, MD   Carlynn Spry, PA-C [place], [place], [place]  [address]                             [phone]  PATIENT ID: Carol Vargas        MRN:  [id number]          DOB/AGE: [birth year] / 39 y.o.    DISCHARGE SUMMARY  ADMISSION DATE:    01/03/2015 DISCHARGE DATE:   01/04/2015  ADMISSION DIAGNOSIS: Failed right total knee     DISCHARGE DIAGNOSIS:  post traumatic arthritis     ADDITIONAL DIAGNOSIS: Active Problems:   S/P total knee arthroplasty  Past Medical History  Diagnosis Date  . Hypertension   . Headache(784.0)     at times, nothing severe per pt.menstral  . Arthritis     PROCEDURE: Procedure(s): TOTAL KNEE ARTHROPLASTY on 01/03/2015  CONSULTS:     HISTORY:  See H&P in chart  HOSPITAL COURSE:  Carol Vargas is a 39 y.o. admitted on 01/03/2015 and found to have a diagnosis of post traumatic arthritis .  After appropriate laboratory studies were obtained  they were taken to the operating room on 01/03/2015 and underwent Procedure(s): TOTAL KNEE ARTHROPLASTY.   They were given perioperative antibiotics:  Anti-infectives    Start     Dose/Rate Route Frequency Ordered Stop   01/03/15 1700  ceFAZolin (ANCEF) IVPB 2 g/50 mL premix     2 g 100 mL/hr over 30 Minutes Intravenous Every 6 hours 01/03/15 1438 01/03/15 2324   01/03/15 0749  ceFAZolin (ANCEF) IVPB 2 g/50 mL premix     2 g 100 mL/hr over 30 Minutes Intravenous On call to O.R. 01/03/15 0749 01/03/15 1045    .  Tolerated the procedure well.  Placed with a foley intraoperatively.  Given Ofirmev at induction and for 48 hours.    POD# 1: Vital signs were stable.  Patient denied Chest pain, shortness of breath, or calf pain.  Patient was started on Lovenox 30 mg subcutaneously twice daily at 8am.  Consults to PT, OT, and care management were made.  The patient was weight bearing as tolerated.  CPM was placed on the operative leg 0-90 degrees for 6-8 hours  a day.  Incentive spirometry was taught.  Dressing was changed.  Hemovac was discontinued.      POD #2, Continued  PT for ambulation and exercise program.  IV saline locked.  O2 discontinued.    The remainder of the hospital course was dedicated to ambulation and strengthening.   The patient was discharged on day 1 post op in  Good condition.  Blood products given:none  DIAGNOSTIC STUDIES: Recent vital signs: No data found.      Recent laboratory studies: No results for input(s): WBC, HGB, HCT, PLT in the last 168 hours. No results for input(s): NA, K, CL, CO2, BUN, CREATININE, GLUCOSE, CALCIUM in the last 168 hours. Lab Results  Component Value Date   INR 1.02 12/24/2014   INR 1.04 07/28/2013   INR 0.96 07/21/2013     Recent Radiographic Studies :  No results found.  DISCHARGE INSTRUCTIONS: Discharge Instructions    CPM    Complete by:  As directed   Continuous passive motion machine (CPM):      Use the CPM from 0 to 90 for 6-8 hours per day.      You may  increase by 10 per day.  You may break it up into 2 or 3 sessions per day.      Use CPM for 2 weeks or until you are told to stop.     Call MD / Call 911    Complete by:  As directed   If you experience chest pain or shortness of breath, CALL 911 and be transported to the hospital emergency room.  If you develope a fever above 101 F, pus (white drainage) or increased drainage or redness at the wound, or calf pain, call your surgeon's office.     Change dressing    Complete by:  As directed   Change dressing on wednesday, then change the dressing daily with sterile 4 x 4 inch gauze dressing and apply TED hose.  You may clean the incision with alcohol prior to redressing.     Constipation Prevention    Complete by:  As directed   Drink plenty of fluids.  Prune juice may be helpful.  You may use a stool softener, such as Colace (over the counter) 100 mg twice a day.  Use MiraLax (over the counter) for constipation as needed.      Diet - low sodium heart healthy    Complete by:  As directed      Do not put a pillow under the knee. Place it under the heel.    Complete by:  As directed      Driving restrictions    Complete by:  As directed   No driving for 6 weeks     Increase activity slowly as tolerated    Complete by:  As directed      Lifting restrictions    Complete by:  As directed   No lifting for 6  weeks     TED hose    Complete by:  As directed   Use stockings (TED hose) for 3 weeks on both leg(s).  You may remove them at night for sleeping.           DISCHARGE MEDICATIONS:     Medication List    TAKE these medications        acetaminophen 325 MG tablet  Commonly known as:  TYLENOL  Take 1-2 tablets (325-650 mg total) by mouth every 6 (six) hours as needed for mild pain, fever or headache.     atorvastatin 20 MG tablet  Commonly known as:  LIPITOR  Take 20 mg by mouth daily.     cloNIDine 0.2 MG tablet  Commonly known as:  CATAPRES  Take 1 tablet (0.2 mg total) by mouth 2 (two) times daily.     enoxaparin 40 MG/0.4ML injection  Commonly known as:  LOVENOX  Inject 0.4 mLs (40 mg total) into the skin daily.     ibuprofen 200 MG tablet  Commonly known as:  ADVIL,MOTRIN  Take 4 tablets (800 mg total) by mouth 3 (three) times daily.     lisinopril 10 MG tablet  Commonly known as:  PRINIVIL,ZESTRIL  Take 1 tablet (10 mg total) by mouth daily.     lisinopril-hydrochlorothiazide 20-12.5 MG per tablet  Commonly known as:  PRINZIDE,ZESTORETIC  Take 1 tablet by mouth 2 (two) times daily.     methocarbamol 500 MG tablet  Commonly known as:  ROBAXIN  Take 1-2 tablets (500-1,000 mg total) by mouth every 6 (six) hours as needed for muscle spasms.     oxyCODONE 5 MG immediate release tablet  Commonly known  as:  Oxy IR/ROXICODONE  Take 1-2 tablets (5-10 mg total) by mouth every 3 (three) hours as needed for breakthrough pain.     OxyCODONE 10 mg T12a 12 hr tablet  Commonly known as:   OXYCONTIN  Take 1 tablet (10 mg total) by mouth every 12 (twelve) hours.     polyethylene glycol packet  Commonly known as:  MIRALAX / GLYCOLAX  Take 17 g by mouth daily as needed for mild constipation.     Vitamin D (Ergocalciferol) 50000 UNITS Caps capsule  Commonly known as:  DRISDOL  Take 50,000 Units by mouth every 7 (seven) days.        FOLLOW UP VISIT:       Follow-up Information    Follow up with [facility].   Specialty:  Home Health Services   Why:  They will contact you to schedule home therapy visits.   Contact information:   [place] [address] [phone]       Follow up with Rudean Haskell, MD. Call on 01/18/2015.   Specialty:  Orthopedic Surgery   Contact information:   [address] [place] [address] [phone]       DISPOSITION: HOME   CONDITION:  Good   Meta Kroenke 01/24/2015, 9:44 AM

## 2015-01-26 ENCOUNTER — Encounter (HOSPITAL_COMMUNITY): Payer: 59 | Admitting: Physical Therapy

## 2015-01-27 ENCOUNTER — Ambulatory Visit (HOSPITAL_COMMUNITY): Payer: 59

## 2015-01-27 ENCOUNTER — Telehealth (HOSPITAL_COMMUNITY): Payer: Self-pay

## 2015-01-27 NOTE — Telephone Encounter (Signed)
Patient is running late and dont feel she will make it in  time for her appt.

## 2015-01-31 ENCOUNTER — Ambulatory Visit (HOSPITAL_COMMUNITY): Payer: 59 | Admitting: Physical Therapy

## 2015-01-31 DIAGNOSIS — R269 Unspecified abnormalities of gait and mobility: Secondary | ICD-10-CM

## 2015-01-31 DIAGNOSIS — M25661 Stiffness of right knee, not elsewhere classified: Secondary | ICD-10-CM

## 2015-01-31 DIAGNOSIS — R29898 Other symptoms and signs involving the musculoskeletal system: Secondary | ICD-10-CM

## 2015-01-31 DIAGNOSIS — M25561 Pain in right knee: Secondary | ICD-10-CM | POA: Diagnosis not present

## 2015-01-31 DIAGNOSIS — Z9889 Other specified postprocedural states: Secondary | ICD-10-CM
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
## 2015-02-02 ENCOUNTER — Ambulatory Visit (HOSPITAL_COMMUNITY): Payer: 59 | Admitting: Physical Therapy

## 2015-02-02 DIAGNOSIS — R29898 Other symptoms and signs involving the musculoskeletal system: Secondary | ICD-10-CM

## 2015-02-02 DIAGNOSIS — R269 Unspecified abnormalities of gait and mobility: Secondary | ICD-10-CM

## 2015-02-02 DIAGNOSIS — M25661 Stiffness of right knee, not elsewhere classified: Secondary | ICD-10-CM

## 2015-02-02 DIAGNOSIS — M25561 Pain in right knee: Secondary | ICD-10-CM

## 2015-02-02 DIAGNOSIS — Z9889 Other specified postprocedural states: Secondary | ICD-10-CM
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
## 2015-02-04 ENCOUNTER — Ambulatory Visit (HOSPITAL_COMMUNITY): Payer: 59

## 2015-02-04 DIAGNOSIS — R269 Unspecified abnormalities of gait and mobility: Secondary | ICD-10-CM

## 2015-02-04 DIAGNOSIS — R29898 Other symptoms and signs involving the musculoskeletal system: Secondary | ICD-10-CM

## 2015-02-04 DIAGNOSIS — M25661 Stiffness of right knee, not elsewhere classified: Secondary | ICD-10-CM

## 2015-02-04 DIAGNOSIS — Z9889 Other specified postprocedural states: Secondary | ICD-10-CM

## 2015-02-04 DIAGNOSIS — M25561 Pain in right knee: Secondary | ICD-10-CM | POA: Diagnosis not present
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
## 2015-02-07 ENCOUNTER — Ambulatory Visit (HOSPITAL_COMMUNITY): Payer: 59 | Attending: Orthopedic Surgery | Admitting: Physical Therapy

## 2015-02-07 ENCOUNTER — Ambulatory Visit (HOSPITAL_COMMUNITY): Payer: 59 | Admitting: Physical Therapy

## 2015-02-07 ENCOUNTER — Telehealth (HOSPITAL_COMMUNITY): Payer: Self-pay | Admitting: Physical Therapy

## 2015-02-07 DIAGNOSIS — Z9889 Other specified postprocedural states: Secondary | ICD-10-CM | POA: Insufficient documentation

## 2015-02-07 DIAGNOSIS — R29898 Other symptoms and signs involving the musculoskeletal system: Secondary | ICD-10-CM | POA: Diagnosis present

## 2015-02-07 DIAGNOSIS — M25561 Pain in right knee: Secondary | ICD-10-CM | POA: Diagnosis present

## 2015-02-07 DIAGNOSIS — M6289 Other specified disorders of muscle: Secondary | ICD-10-CM | POA: Insufficient documentation

## 2015-02-07 DIAGNOSIS — M25661 Stiffness of right knee, not elsewhere classified: Secondary | ICD-10-CM | POA: Insufficient documentation

## 2015-02-07 DIAGNOSIS — R269 Unspecified abnormalities of gait and mobility: Secondary | ICD-10-CM | POA: Insufficient documentation
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
## 2015-02-07 NOTE — Telephone Encounter (Signed)
Lack of Transportation °

## 2015-02-09 ENCOUNTER — Ambulatory Visit (HOSPITAL_COMMUNITY): Payer: 59

## 2015-02-09 DIAGNOSIS — M25561 Pain in right knee: Secondary | ICD-10-CM | POA: Diagnosis not present

## 2015-02-09 DIAGNOSIS — Z9889 Other specified postprocedural states: Secondary | ICD-10-CM

## 2015-02-09 DIAGNOSIS — M25661 Stiffness of right knee, not elsewhere classified: Secondary | ICD-10-CM

## 2015-02-09 DIAGNOSIS — R29898 Other symptoms and signs involving the musculoskeletal system: Secondary | ICD-10-CM

## 2015-02-09 DIAGNOSIS — R269 Unspecified abnormalities of gait and mobility: Secondary | ICD-10-CM
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
## 2015-02-11 ENCOUNTER — Ambulatory Visit (HOSPITAL_COMMUNITY): Payer: 59 | Admitting: Physical Therapy

## 2015-02-11 DIAGNOSIS — M25561 Pain in right knee: Secondary | ICD-10-CM | POA: Diagnosis not present

## 2015-02-11 DIAGNOSIS — Z9889 Other specified postprocedural states: Secondary | ICD-10-CM

## 2015-02-11 DIAGNOSIS — R29898 Other symptoms and signs involving the musculoskeletal system: Secondary | ICD-10-CM

## 2015-02-11 DIAGNOSIS — R269 Unspecified abnormalities of gait and mobility: Secondary | ICD-10-CM

## 2015-02-11 DIAGNOSIS — M25661 Stiffness of right knee, not elsewhere classified: Secondary | ICD-10-CM
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
## 2015-02-14 ENCOUNTER — Ambulatory Visit (HOSPITAL_COMMUNITY): Payer: 59 | Admitting: Physical Therapy

## 2015-02-14 DIAGNOSIS — M25561 Pain in right knee: Secondary | ICD-10-CM

## 2015-02-14 DIAGNOSIS — M25661 Stiffness of right knee, not elsewhere classified: Secondary | ICD-10-CM
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
## 2015-02-16 ENCOUNTER — Ambulatory Visit (HOSPITAL_COMMUNITY): Payer: 59 | Admitting: Physical Therapy

## 2015-02-16 DIAGNOSIS — M25561 Pain in right knee: Secondary | ICD-10-CM | POA: Diagnosis not present

## 2015-02-16 DIAGNOSIS — M25661 Stiffness of right knee, not elsewhere classified: Secondary | ICD-10-CM
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
## 2015-02-18 ENCOUNTER — Encounter (HOSPITAL_COMMUNITY): Payer: 59 | Admitting: Physical Therapy

## 2015-02-21 ENCOUNTER — Encounter (HOSPITAL_COMMUNITY): Payer: 59 | Admitting: Physical Therapy

## 2015-02-23 ENCOUNTER — Encounter (HOSPITAL_COMMUNITY): Payer: 59

## 2015-02-28 ENCOUNTER — Encounter (HOSPITAL_COMMUNITY): Payer: 59 | Admitting: Physical Therapy

## 2015-03-02 ENCOUNTER — Encounter (HOSPITAL_COMMUNITY): Payer: 59 | Admitting: Physical Therapy

## 2015-08-19 DIAGNOSIS — R5383 Other fatigue: Secondary | ICD-10-CM | POA: Diagnosis not present

## 2015-09-22 ENCOUNTER — Ambulatory Visit: Payer: Self-pay | Admitting: Family

## 2015-09-29 ENCOUNTER — Encounter: Payer: Self-pay | Admitting: Family

## 2015-09-29 ENCOUNTER — Ambulatory Visit (INDEPENDENT_AMBULATORY_CARE_PROVIDER_SITE_OTHER): Payer: Medicaid Other | Admitting: Family

## 2015-09-29 VITALS — BP 121/85 | HR 70 | Temp 97.6°F | Ht 66.5 in | Wt 259.0 lb

## 2015-09-29 DIAGNOSIS — I10 Essential (primary) hypertension: Secondary | ICD-10-CM | POA: Diagnosis not present

## 2015-09-29 DIAGNOSIS — R5383 Other fatigue: Secondary | ICD-10-CM | POA: Diagnosis not present

## 2015-09-29 DIAGNOSIS — Z1239 Encounter for other screening for malignant neoplasm of breast: Secondary | ICD-10-CM

## 2015-09-29 DIAGNOSIS — E559 Vitamin D deficiency, unspecified: Secondary | ICD-10-CM

## 2015-09-29 DIAGNOSIS — E785 Hyperlipidemia, unspecified: Secondary | ICD-10-CM

## 2015-09-29 DIAGNOSIS — E876 Hypokalemia: Secondary | ICD-10-CM | POA: Diagnosis not present

## 2015-09-29 MED ORDER — ALUMINUM CHLORIDE HEXAHYDRATE CRYS: CRYSTALS | Status: DC

## 2015-09-29 MED ORDER — LOSARTAN POTASSIUM-HCTZ 50-12.5 MG PO TABS: 1.0000 | ORAL_TABLET | Freq: Every day | ORAL | Status: DC

## 2015-09-29 NOTE — Patient Instructions (Signed)
Hyperhidrosis It is normal to sweat when you are hot, being physically active, or feeling anxious. Sweating is a necessary function for your body. However, hyperhidrosis is when you sweat too much (excessively). Although hyperhidrosis is not dangerous, it can make you feel embarrassed.  There are two kinds of hyperhidrosis:  Primary hyperhidrosis. The sweating usually localizes in one part of your body, such as your underarms, or in a few areas, such as your feet, face, armpits, and hands. This is the more common kind of hyperhidrosis.  Secondary hyperhidrosis. This type more likely affects your entire body. CAUSES The cause of your hyperhidrosis depends on the kind you have.  Primary hyperhidrosis may be caused by having sweat glands that are more active than normal.  Secondary hyperhidrosis is caused by an underlying condition. Possible conditions include:  Diabetes.  Gout.  Certain medicines.  Anxiety.  Stroke.  Obesity.  Menopause.  Overactive thyroid (hyperthyroidism).  Tumors.  Frostbite.  Certain types of cancers.  Alcoholism.  Injury to your nervous system.  Stroke.  Parkinson disease. RISK FACTORS You may be at an increased risk for primary hyperhidrosis if you have a family history of it. SIGNS AND SYMPTOMS General symptoms of hyperhidrosis may include:  Feeling like you are sweating constantly, even while you are resting.  Having skin that peels or gets paler or softer in the areas where you sweat the most.  Being able to see sweat on your skin. Symptoms of primary hyperhidrosis may include:  Sweating in specific areas, such as your armpits, palms, feet, and face.  Sweating in the same location on both sides of your body.  Sweating only during the day. Symptoms of secondary hyperhidrosis may include:  Sweating all over your body.  Sweating even while you sleep. DIAGNOSIS  Hyperhidrosis may be diagnosed by:  Medical history and physical  exam.  Testing, such as:  Sweat test.  Paper test. TREATMENT Your treatment will depend on the kind of hyperhidrosis you have and the parts of your body that are affected. If your hyperhidrosis is caused by an underlying condition, your treatment will address the cause. Treatment may include:  Strong antiperspirants. Your health care provider may give you a prescription.  Medicines taken by mouth.  Medicines injected by your health care provider. These may include small amounts of botulinum toxin.  Iontophoresis. This is a procedure that temporarily turns off the sweat glands in your hands and feet.  Surgery to remove your sweat glands.  Sympathectomy. This is a procedure that cuts or destroys your nerves so that they do not send a signal to sweat. HOME CARE INSTRUCTIONS  Take medicines only as directed by your health care provider.  Use antiperspirants as directed by your health care provider.  Limit or avoid foods or beverages that seem to increase your chances of sweating, such as:  Spicy food.  Caffeine.  Alcohol.  Foods that contain MSG.  If your feet sweat:  Wear sandals, when possible.  Do not wear cotton socks. Wear socks that remove or wick moisture from your feet.  Wear leather shoes.  Avoid wearing the same pair of shoes two days in a row.  Consider joining a hyperhidrosis support group. SEEK MEDICAL CARE IF:   You have new symptoms.  Your symptoms get worse.   This information is not intended to replace advice given to you by your health care provider. Make sure you discuss any questions you have with your health care provider.   Document Released:  08/24/2005 Document Revised: 07/16/2014 Document Reviewed: 02/02/2014 Elsevier Interactive Patient Education 2016 [place] Maintenance, Female Adopting a healthy lifestyle and getting preventive care can go a long way to promote health and wellness. Talk with your health care provider  about what schedule of regular examinations is right for you. This is a good chance for you to check in with your provider about disease prevention and staying healthy. In between checkups, there are plenty of things you can do on your own. Experts have done a lot of research about which lifestyle changes and preventive measures are most likely to keep you healthy. Ask your health care provider for more information. WEIGHT AND DIET  Eat a healthy diet  Be sure to include plenty of vegetables, fruits, low-fat dairy products, and lean protein.  Do not eat a lot of foods high in solid fats, added sugars, or salt.  Get regular exercise. This is one of the most important things you can do for your health.  Most adults should exercise for at least 150 minutes each week. The exercise should increase your heart rate and make you sweat (moderate-intensity exercise).  Most adults should also do strengthening exercises at least twice a week. This is in addition to the moderate-intensity exercise.  Maintain a healthy weight  Body mass index (BMI) is a measurement that can be used to identify possible weight problems. It estimates body fat based on height and weight. Your health care provider can help determine your BMI and help you achieve or maintain a healthy weight.  For females 31 years of age and older:   A BMI below 18.5 is considered underweight.  A BMI of 18.5 to 24.9 is normal.  A BMI of 25 to 29.9 is considered overweight.  A BMI of 30 and above is considered obese.  Watch levels of cholesterol and blood lipids  You should start having your blood tested for lipids and cholesterol at 42 years of age, then have this test every 5 years.  You may need to have your cholesterol levels checked more often if:  Your lipid or cholesterol levels are high.  You are older than 42 years of age.  You are at high risk for heart disease.  CANCER SCREENING   Lung Cancer  Lung cancer  screening is recommended for adults 15-72 years old who are at high risk for lung cancer because of a history of smoking.  A yearly low-dose CT scan of the lungs is recommended for people who:  Currently smoke.  Have quit within the past 15 years.  Have at least a 30-pack-year history of smoking. A pack year is smoking an average of one pack of cigarettes a day for 1 year.  Yearly screening should continue until it has been 15 years since you quit.  Yearly screening should stop if you develop a health problem that would prevent you from having lung cancer treatment.  Breast Cancer  Practice breast self-awareness. This means understanding how your breasts normally appear and feel.  It also means doing regular breast self-exams. Let your health care provider know about any changes, no matter how small.  If you are in your 20s or 30s, you should have a clinical breast exam (CBE) by a health care provider every 1-3 years as part of a regular health exam.  If you are 54 or older, have a CBE every year. Also consider having a breast X-ray (mammogram) every year.  If you have a family history  of breast cancer, talk to your health care provider about genetic screening.  If you are at high risk for breast cancer, talk to your health care provider about having an MRI and a mammogram every year.  Breast cancer gene (BRCA) assessment is recommended for women who have family members with BRCA-related cancers. BRCA-related cancers include:  Breast.  Ovarian.  Tubal.  Peritoneal cancers.  Results of the assessment will determine the need for genetic counseling and BRCA1 and BRCA2 testing. Cervical Cancer Your health care provider may recommend that you be screened regularly for cancer of the pelvic organs (ovaries, uterus, and vagina). This screening involves a pelvic examination, including checking for microscopic changes to the surface of your cervix (Pap test). You may be encouraged to  have this screening done every 3 years, beginning at age 36.  For women ages 68-65, health care providers may recommend pelvic exams and Pap testing every 3 years, or they may recommend the Pap and pelvic exam, combined with testing for human papilloma virus (HPV), every 5 years. Some types of HPV increase your risk of cervical cancer. Testing for HPV may also be done on women of any age with unclear Pap test results.  Other health care providers may not recommend any screening for nonpregnant women who are considered low risk for pelvic cancer and who do not have symptoms. Ask your health care provider if a screening pelvic exam is right for you.  If you have had past treatment for cervical cancer or a condition that could lead to cancer, you need Pap tests and screening for cancer for at least 20 years after your treatment. If Pap tests have been discontinued, your risk factors (such as having a new sexual partner) need to be reassessed to determine if screening should resume. Some women have medical problems that increase the chance of getting cervical cancer. In these cases, your health care provider may recommend more frequent screening and Pap tests. Colorectal Cancer  This type of cancer can be detected and often prevented.  Routine colorectal cancer screening usually begins at 42 years of age and continues through 42 years of age.  Your health care provider may recommend screening at an earlier age if you have risk factors for colon cancer.  Your health care provider may also recommend using home test kits to check for hidden blood in the stool.  A small camera at the end of a tube can be used to examine your colon directly (sigmoidoscopy or colonoscopy). This is done to check for the earliest forms of colorectal cancer.  Routine screening usually begins at age 68.  Direct examination of the colon should be repeated every 5-10 years through 42 years of age. However, you may need to be  screened more often if early forms of precancerous polyps or small growths are found. Skin Cancer  Check your skin from head to toe regularly.  Tell your health care provider about any new moles or changes in moles, especially if there is a change in a mole's shape or color.  Also tell your health care provider if you have a mole that is larger than the size of a pencil eraser.  Always use sunscreen. Apply sunscreen liberally and repeatedly throughout the day.  Protect yourself by wearing long sleeves, pants, a wide-brimmed hat, and sunglasses whenever you are outside. HEART DISEASE, DIABETES, AND HIGH BLOOD PRESSURE   High blood pressure causes heart disease and increases the risk of stroke. High blood pressure is  more likely to develop in:  People who have blood pressure in the high end of the normal range (130-139/85-89 mm Hg).  People who are overweight or obese.  People who are African American.  If you are 82-43 years of age, have your blood pressure checked every 3-5 years. If you are 79 years of age or older, have your blood pressure checked every year. You should have your blood pressure measured twice--once when you are at a hospital or clinic, and once when you are not at a hospital or clinic. Record the average of the two measurements. To check your blood pressure when you are not at a hospital or clinic, you can use:  An automated blood pressure machine at a pharmacy.  A home blood pressure monitor.  If you are between 68 years and 61 years old, ask your health care provider if you should take aspirin to prevent strokes.  Have regular diabetes screenings. This involves taking a blood sample to check your fasting blood sugar level.  If you are at a normal weight and have a low risk for diabetes, have this test once every three years after 42 years of age.  If you are overweight and have a high risk for diabetes, consider being tested at a younger age or more  often. PREVENTING INFECTION  Hepatitis B  If you have a higher risk for hepatitis B, you should be screened for this virus. You are considered at high risk for hepatitis B if:  You were born in a country where hepatitis B is common. Ask your health care provider which countries are considered high risk.  Your parents were born in a high-risk country, and you have not been immunized against hepatitis B (hepatitis B vaccine).  You have HIV or AIDS.  You use needles to inject street drugs.  You live with someone who has hepatitis B.  You have had sex with someone who has hepatitis B.  You get hemodialysis treatment.  You take certain medicines for conditions, including cancer, organ transplantation, and autoimmune conditions. Hepatitis C  Blood testing is recommended for:  Everyone born from [birth year] through 1967.  Anyone with known risk factors for hepatitis C. Sexually transmitted infections (STIs)  You should be screened for sexually transmitted infections (STIs) including gonorrhea and chlamydia if:  You are sexually active and are younger than 42 years of age.  You are older than 42 years of age and your health care provider tells you that you are at risk for this type of infection.  Your sexual activity has changed since you were last screened and you are at an increased risk for chlamydia or gonorrhea. Ask your health care provider if you are at risk.  If you do not have HIV, but are at risk, it may be recommended that you take a prescription medicine daily to prevent HIV infection. This is called pre-exposure prophylaxis (PrEP). You are considered at risk if:  You are sexually active and do not regularly use condoms or know the HIV status of your partner(s).  You take drugs by injection.  You are sexually active with a partner who has HIV. Talk with your health care provider about whether you are at high risk of being infected with HIV. If you choose to begin PrEP, you  should first be tested for HIV. You should then be tested every 3 months for as long as you are taking PrEP.  PREGNANCY   If you are premenopausal and you  may become pregnant, ask your health care provider about preconception counseling.  If you may become pregnant, take 400 to 800 micrograms (mcg) of folic acid every day.  If you want to prevent pregnancy, talk to your health care provider about birth control (contraception). OSTEOPOROSIS AND MENOPAUSE   Osteoporosis is a disease in which the bones lose minerals and strength with aging. This can result in serious bone fractures. Your risk for osteoporosis can be identified using a bone density scan.  If you are 37 years of age or older, or if you are at risk for osteoporosis and fractures, ask your health care provider if you should be screened.  Ask your health care provider whether you should take a calcium or vitamin D supplement to lower your risk for osteoporosis.  Menopause may have certain physical symptoms and risks.  Hormone replacement therapy may reduce some of these symptoms and risks. Talk to your health care provider about whether hormone replacement therapy is right for you.  HOME CARE INSTRUCTIONS   Schedule regular health, dental, and eye exams.  Stay current with your immunizations.   Do not use any tobacco products including cigarettes, chewing tobacco, or electronic cigarettes.  If you are pregnant, do not drink alcohol.  If you are breastfeeding, limit how much and how often you drink alcohol.  Limit alcohol intake to no more than 1 drink per day for nonpregnant women. One drink equals 12 ounces of beer, 5 ounces of wine, or 1 ounces of hard liquor.  Do not use street drugs.  Do not share needles.  Ask your health care provider for help if you need support or information about quitting drugs.  Tell your health care provider if you often feel depressed.  Tell your health care provider if you have ever  been abused or do not feel safe at home.   This information is not intended to replace advice given to you by your health care provider. Make sure you discuss any questions you have with your health care provider.   Document Released: 01/08/2011 Document Revised: 07/16/2014 Document Reviewed: 05/27/2013 Elsevier Interactive Patient Education [employer].

## 2015-09-29 NOTE — Addendum Note (Signed)
Addended by: Evelina Dun A on: 09/29/2015 03:02 PM   Modules accepted: Orders

## 2015-09-29 NOTE — Progress Notes (Signed)
Subjective:    Patient ID: Carol Vargas, female    DOB: April 26, 1976, 40 y.o.   MRN: [id number]  Pt presents to the office today to establish care and for chronic follow up and lab work.  Hypertension This is a chronic problem. The current episode started more than 1 year ago. The problem has been resolved since onset. The problem is controlled. Associated symptoms include headaches ("at times") and malaise/fatigue. Pertinent negatives include no anxiety, palpitations, peripheral edema or shortness of breath. Risk factors for coronary artery disease include dyslipidemia, obesity, post-menopausal state and family history. Past treatments include ACE inhibitors and diuretics. The current treatment provides moderate improvement. There is no history of kidney disease, CAD/MI, CVA, heart failure or a thyroid problem.  Hyperlipidemia This is a chronic problem. The current episode started more than 1 year ago. The problem is uncontrolled. Recent lipid tests were reviewed and are high. Factors aggravating her hyperlipidemia include thiazides. Pertinent negatives include no shortness of breath. Current antihyperlipidemic treatment includes statins. The current treatment provides moderate improvement of lipids. Risk factors for coronary artery disease include dyslipidemia, hypertension, obesity and family history.      Review of Systems  Constitutional: Positive for malaise/fatigue.  Eyes: Negative.   Respiratory: Negative.  Negative for shortness of breath.   Cardiovascular: Negative.  Negative for palpitations.  Gastrointestinal: Negative.   Endocrine: Negative.   Genitourinary: Negative.   Musculoskeletal: Negative.   Neurological: Positive for headaches ("at times").  Hematological: Negative.   Psychiatric/Behavioral: Negative.   All other systems reviewed and are negative.      Objective:   Physical Exam  Constitutional: She is oriented to person, place, and time. She appears  well-developed and well-nourished. No distress.  HENT:  Head: Normocephalic and atraumatic.  Right Ear: External ear normal.  Left Ear: External ear normal.  Nose: Nose normal.  Mouth/Throat: Oropharynx is clear and moist.  Eyes: Pupils are equal, round, and reactive to light.  Neck: Normal range of motion. Neck supple. No thyromegaly present.  Cardiovascular: Normal rate, regular rhythm, normal heart sounds and intact distal pulses.   No murmur heard. Pulmonary/Chest: Effort normal and breath sounds normal. No respiratory distress. She has no wheezes.  Abdominal: Soft. Bowel sounds are normal. She exhibits no distension. There is no tenderness.  Musculoskeletal: Normal range of motion. She exhibits no edema or tenderness.  Neurological: She is alert and oriented to person, place, and time. She has normal reflexes. No cranial nerve deficit.  Skin: Skin is warm and dry.  Psychiatric: She has a normal mood and affect. Her behavior is normal. Judgment and thought content normal.  Vitals reviewed.     BP 121/85 mmHg  Pulse 70  Temp(Src) 97.6 F (36.4 C) (Oral)  Ht 5' 6.5" (1.689 m)  Wt 259 lb (117.482 kg)  BMI 41.18 kg/m2     Assessment & Plan:  1. Essential hypertension -Pt's lisinopril stopped today related to cough and started on losartan 50 mg daily - CMP14+EGFR - losartan-hydrochlorothiazide (HYZAAR) 50-12.5 MG tablet; Take 1 tablet by mouth daily.  Dispense: 90 tablet; Refill: 3  2. Hyperlipemia - CMP14+EGFR - Lipid panel  3. Vitamin D deficiency - CMP14+EGFR - VITAMIN D 25 Hydroxy (Vit-D Deficiency, Fractures)  4. Morbid obesity, unspecified obesity type ([place]) - CMP14+EGFR  5. Hypokalemia - CMP14+EGFR  6. Other fatigue - CMP14+EGFR - Thyroid Panel With TSH  7. Breast cancer screening - CMP14+EGFR - [facility]   Continue all meds Labs pending Health Maintenance  reviewed Diet and exercise encouraged RTO 6 months- Pt to schedule that as pap  appt  Evelina Dun, FNP

## 2015-09-30 LAB — CMP14+EGFR
A/G RATIO: 1.6 (ref 1.2–2.2)
ALK PHOS: 76 IU/L (ref 39–117)
ALT: 25 IU/L (ref 0–32)
AST: 22 IU/L (ref 0–40)
Albumin: 4.6 g/dL (ref 3.5–5.5)
BUN/Creatinine Ratio: 15 (ref 9–23)
BUN: 13 mg/dL (ref 6–24)
Bilirubin Total: 0.3 mg/dL (ref 0.0–1.2)
CO2: 26 mmol/L (ref 18–29)
Calcium: 9.5 mg/dL (ref 8.7–10.2)
Chloride: 98 mmol/L (ref 96–106)
Creatinine, Ser: 0.88 mg/dL (ref 0.57–1.00)
GFR calc Af Amer: 94 mL/min/{1.73_m2} (ref >59)
GFR calc non Af Amer: 82 mL/min/{1.73_m2} (ref >59)
GLUCOSE: 85 mg/dL (ref 65–99)
Globulin, Total: 2.9 g/dL (ref 1.5–4.5)
POTASSIUM: 3.7 mmol/L (ref 3.5–5.2)
Sodium: 140 mmol/L (ref 134–144)
Total Protein: 7.5 g/dL (ref 6.0–8.5)

## 2015-09-30 LAB — LIPID PANEL
CHOL/HDL RATIO: 4.3 ratio (ref 0.0–4.4)
Cholesterol, Total: 195 mg/dL (ref 100–199)
HDL: 45 mg/dL (ref >39)
LDL CALC: 119 mg/dL — AB (ref 0–99)
Triglycerides: 154 mg/dL — ABNORMAL HIGH (ref 0–149)
VLDL Cholesterol Cal: 31 mg/dL (ref 5–40)

## 2015-09-30 LAB — THYROID PANEL WITH TSH
Free Thyroxine Index: 2.1 (ref 1.2–4.9)
T3 UPTAKE RATIO: 26 % (ref 24–39)
T4, Total: 8.1 ug/dL (ref 4.5–12.0)
TSH: 0.948 u[IU]/mL (ref 0.450–4.500)

## 2015-09-30 LAB — VITAMIN D 25 HYDROXY (VIT D DEFICIENCY, FRACTURES): Vit D, 25-Hydroxy: 53.5 ng/mL (ref 30.0–100.0)

## 2015-10-18 ENCOUNTER — Other Ambulatory Visit: Payer: Self-pay | Admitting: Family

## 2015-10-26 ENCOUNTER — Encounter: Payer: Self-pay | Admitting: Family

## 2015-11-28 ENCOUNTER — Telehealth: Payer: Self-pay | Admitting: Family

## 2015-11-28 NOTE — Telephone Encounter (Signed)
Last note pt's BP was WNL and med was prescribed daily

## 2015-11-28 NOTE — Telephone Encounter (Signed)
[facility] aware

## 2015-12-07 ENCOUNTER — Telehealth: Payer: Self-pay

## 2015-12-07 NOTE — Telephone Encounter (Signed)
Pharmacy called requesting that potassium be changed from 20 meq caps to 10 meq capsules 2 daily per patient request. Patient states that these are easier to swallow. Please advise on change and send to pharmacy if approved

## 2015-12-08 MED ORDER — POTASSIUM CHLORIDE ER 10 MEQ PO TBCR: 10.0000 meq | EXTENDED_RELEASE_TABLET | Freq: Two times a day (BID) | ORAL | Status: DC

## 2015-12-08 NOTE — Telephone Encounter (Signed)
Left message for pt that RX was sent into the pharmacy

## 2015-12-08 NOTE — Telephone Encounter (Signed)
RX sent to pharmacy  

## 2015-12-13 ENCOUNTER — Encounter: Payer: Medicaid Other | Admitting: Family

## 2015-12-15 ENCOUNTER — Telehealth: Payer: Self-pay | Admitting: Family

## 2015-12-15 ENCOUNTER — Ambulatory Visit (INDEPENDENT_AMBULATORY_CARE_PROVIDER_SITE_OTHER): Payer: Medicaid Other | Admitting: Family

## 2015-12-15 ENCOUNTER — Encounter: Payer: Self-pay | Admitting: Family

## 2015-12-15 VITALS — BP 143/97 | HR 64 | Temp 98.7°F | Resp 16 | Ht 67.0 in | Wt 262.8 lb

## 2015-12-15 DIAGNOSIS — Z01419 Encounter for gynecological examination (general) (routine) without abnormal findings: Secondary | ICD-10-CM | POA: Diagnosis not present

## 2015-12-15 DIAGNOSIS — E785 Hyperlipidemia, unspecified: Secondary | ICD-10-CM | POA: Diagnosis not present

## 2015-12-15 DIAGNOSIS — I10 Essential (primary) hypertension: Secondary | ICD-10-CM

## 2015-12-15 DIAGNOSIS — Z30011 Encounter for initial prescription of contraceptive pills: Secondary | ICD-10-CM

## 2015-12-15 DIAGNOSIS — E8881 Metabolic syndrome: Secondary | ICD-10-CM | POA: Insufficient documentation

## 2015-12-15 DIAGNOSIS — Z0000 Encounter for general adult medical examination without abnormal findings: Secondary | ICD-10-CM

## 2015-12-15 DIAGNOSIS — Z020 Encounter for examination for admission to educational institution: Secondary | ICD-10-CM

## 2015-12-15 DIAGNOSIS — E559 Vitamin D deficiency, unspecified: Secondary | ICD-10-CM

## 2015-12-15 DIAGNOSIS — E876 Hypokalemia: Secondary | ICD-10-CM

## 2015-12-15 LAB — MICROSCOPIC EXAMINATION

## 2015-12-15 LAB — URINALYSIS, COMPLETE
Bilirubin, UA: NEGATIVE
GLUCOSE, UA: NEGATIVE
Ketones, UA: NEGATIVE
Leukocytes, UA: NEGATIVE
NITRITE UA: NEGATIVE
PH UA: 5.5 (ref 5.0–7.5)
Protein, UA: NEGATIVE
Specific Gravity, UA: 1.02 (ref 1.005–1.030)
UUROB: 0.2 mg/dL (ref 0.2–1.0)

## 2015-12-15 MED ORDER — NORGESTIMATE-ETH ESTRADIOL 0.25-35 MG-MCG PO TABS: 1.0000 | ORAL_TABLET | Freq: Every day | ORAL | Status: DC

## 2015-12-15 NOTE — Telephone Encounter (Signed)
Spoke with patient and she will try and contact the school that she went to in [place] and get a copy of her shot records.

## 2015-12-15 NOTE — Addendum Note (Signed)
Addended by: Evelina Dun A on: 12/15/2015 12:59 PM   Modules accepted: Orders, SmartSet

## 2015-12-15 NOTE — Progress Notes (Signed)
Subjective:    Patient ID: Carol Vargas, female    DOB: Feb 09, 1976, 40 y.o.   MRN: [id number]  Pt presents to the office today for CPE with pap and school physical forms. Gynecologic Exam Pertinent negatives include no headaches.  Hypertension This is a chronic problem. The current episode started more than 1 year ago. The problem has been waxing and waning since onset. The problem is uncontrolled. Associated symptoms include malaise/fatigue. Pertinent negatives include no anxiety, headaches, palpitations, peripheral edema or shortness of breath. Risk factors for coronary artery disease include dyslipidemia, obesity, post-menopausal state and family history. Past treatments include ACE inhibitors and diuretics. The current treatment provides moderate improvement. Hypertensive end-organ damage includes left ventricular hypertrophy. There is no history of kidney disease, CAD/MI, CVA, heart failure or a thyroid problem.  Hyperlipidemia This is a chronic problem. The current episode started more than 1 year ago. The problem is uncontrolled. Recent lipid tests were reviewed and are high. Factors aggravating her hyperlipidemia include thiazides. Pertinent negatives include no shortness of breath. Current antihyperlipidemic treatment includes statins. The current treatment provides moderate improvement of lipids. Risk factors for coronary artery disease include dyslipidemia, hypertension, obesity and family history.      Review of Systems  Constitutional: Positive for malaise/fatigue.  Eyes: Negative.   Respiratory: Negative.  Negative for shortness of breath.   Cardiovascular: Negative.  Negative for palpitations.  Gastrointestinal: Negative.   Endocrine: Negative.   Genitourinary: Negative.   Musculoskeletal: Negative.   Neurological: Negative for headaches.  Hematological: Negative.   Psychiatric/Behavioral: Negative.   All other systems reviewed and are negative.      Objective:   Physical Exam  Constitutional: She is oriented to person, place, and time. She appears well-developed and well-nourished. No distress.  HENT:  Head: Normocephalic and atraumatic.  Right Ear: External ear normal.  Left Ear: External ear normal.  Nose: Nose normal.  Mouth/Throat: Oropharynx is clear and moist.  Eyes: Pupils are equal, round, and reactive to light.  Neck: Normal range of motion. Neck supple. No thyromegaly present.  Cardiovascular: Normal rate, regular rhythm, normal heart sounds and intact distal pulses.   No murmur heard. Pulmonary/Chest: Effort normal and breath sounds normal. No respiratory distress. She has no wheezes. Right breast exhibits no inverted nipple, no mass, no nipple discharge, no skin change and no tenderness. Left breast exhibits no inverted nipple, no mass, no nipple discharge, no skin change and no tenderness. Breasts are symmetrical.  Abdominal: Soft. Bowel sounds are normal. She exhibits no distension. There is no tenderness.  Genitourinary: Vagina normal.  Bimanual exam- no adnexal masses or tenderness, ovaries nonpalpable   Cervix parous and pink- No discharge   Musculoskeletal: Normal range of motion. She exhibits no edema or tenderness.  Neurological: She is alert and oriented to person, place, and time. She has normal reflexes. No cranial nerve deficit.  Skin: Skin is warm and dry.  Psychiatric: She has a normal mood and affect. Her behavior is normal. Judgment and thought content normal.  Vitals reviewed.     BP 143/97 mmHg  Pulse 64  Temp(Src) 98.7 F (37.1 C) (Oral)  Resp 16  Ht _0  (1.702 m)  Wt 262 lb 12.8 oz (119.205 kg)  BMI 41.15 kg/m2     Assessment & Plan:  1. Encounter for routine gynecological examination - Urinalysis, Complete - CMP14+EGFR - Pap IG w/ reflex to HPV when ASC-U  2. Essential hypertension - CMP14+EGFR  3. Hyperlipemia - Lipid panel -  CMP14+EGFR  4. Hypokalemia - CMP14+EGFR  5. Morbid obesity,  unspecified obesity type ([place]) - CMP14+EGFR  6. Vitamin D deficiency - CMP14+EGFR - VITAMIN D 25 Hydroxy (Vit-D Deficiency, Fractures)  7. Encounter for routine history and physical examination of adult - Anemia Profile B - Lipid panel - CMP14+EGFR - Thyroid Panel With TSH - VITAMIN D 25 Hydroxy (Vit-D Deficiency, Fractures) - Pap IG w/ reflex to HPV when ASC-U - Measles/Mumps/Rubella Immunity - Varicella zoster antibody, IgG  8. School physical exam - Varicella zoster antibody, IgG   Continue all meds Labs pending Health Maintenance reviewed Diet and exercise encouraged RTO 6 months  Evelina Dun, FNP

## 2015-12-15 NOTE — Patient Instructions (Signed)
Health Maintenance, Female Adopting a healthy lifestyle and getting preventive care can go a long way to promote health and wellness. Talk with your health care provider about what schedule of regular examinations is right for you. This is a good chance for you to check in with your provider about disease prevention and staying healthy. In between checkups, there are plenty of things you can do on your own. Experts have done a lot of research about which lifestyle changes and preventive measures are most likely to keep you healthy. Ask your health care provider for more information. WEIGHT AND DIET  Eat a healthy diet  Be sure to include plenty of vegetables, fruits, low-fat dairy products, and lean protein.  Do not eat a lot of foods high in solid fats, added sugars, or salt.  Get regular exercise. This is one of the most important things you can do for your health.  Most adults should exercise for at least 150 minutes each week. The exercise should increase your heart rate and make you sweat (moderate-intensity exercise).  Most adults should also do strengthening exercises at least twice a week. This is in addition to the moderate-intensity exercise.  Maintain a healthy weight  Body mass index (BMI) is a measurement that can be used to identify possible weight problems. It estimates body fat based on height and weight. Your health care provider can help determine your BMI and help you achieve or maintain a healthy weight.  For females 20 years of age and older:   A BMI below 18.5 is considered underweight.  A BMI of 18.5 to 24.9 is normal.  A BMI of 25 to 29.9 is considered overweight.  A BMI of 30 and above is considered obese.  Watch levels of cholesterol and blood lipids  You should start having your blood tested for lipids and cholesterol at 42 years of age, then have this test every 5 years.  You may need to have your cholesterol levels checked more often if:  Your lipid  or cholesterol levels are high.  You are older than 42 years of age.  You are at high risk for heart disease.  CANCER SCREENING   Lung Cancer  Lung cancer screening is recommended for adults 55-80 years old who are at high risk for lung cancer because of a history of smoking.  A yearly low-dose CT scan of the lungs is recommended for people who:  Currently smoke.  Have quit within the past 15 years.  Have at least a 30-pack-year history of smoking. A pack year is smoking an average of one pack of cigarettes a day for 1 year.  Yearly screening should continue until it has been 15 years since you quit.  Yearly screening should stop if you develop a health problem that would prevent you from having lung cancer treatment.  Breast Cancer  Practice breast self-awareness. This means understanding how your breasts normally appear and feel.  It also means doing regular breast self-exams. Let your health care provider know about any changes, no matter how small.  If you are in your 20s or 30s, you should have a clinical breast exam (CBE) by a health care provider every 1-3 years as part of a regular health exam.  If you are 40 or older, have a CBE every year. Also consider having a breast X-ray (mammogram) every year.  If you have a family history of breast cancer, talk to your health care provider about genetic screening.  If you   are at high risk for breast cancer, talk to your health care provider about having an MRI and a mammogram every year.  Breast cancer gene (BRCA) assessment is recommended for women who have family members with BRCA-related cancers. BRCA-related cancers include:  Breast.  Ovarian.  Tubal.  Peritoneal cancers.  Results of the assessment will determine the need for genetic counseling and BRCA1 and BRCA2 testing. Cervical Cancer Your health care provider may recommend that you be screened regularly for cancer of the pelvic organs (ovaries, uterus, and  vagina). This screening involves a pelvic examination, including checking for microscopic changes to the surface of your cervix (Pap test). You may be encouraged to have this screening done every 3 years, beginning at age 21.  For women ages 30-65, health care providers may recommend pelvic exams and Pap testing every 3 years, or they may recommend the Pap and pelvic exam, combined with testing for human papilloma virus (HPV), every 5 years. Some types of HPV increase your risk of cervical cancer. Testing for HPV may also be done on women of any age with unclear Pap test results.  Other health care providers may not recommend any screening for nonpregnant women who are considered low risk for pelvic cancer and who do not have symptoms. Ask your health care provider if a screening pelvic exam is right for you.  If you have had past treatment for cervical cancer or a condition that could lead to cancer, you need Pap tests and screening for cancer for at least 20 years after your treatment. If Pap tests have been discontinued, your risk factors (such as having a new sexual partner) need to be reassessed to determine if screening should resume. Some women have medical problems that increase the chance of getting cervical cancer. In these cases, your health care provider may recommend more frequent screening and Pap tests. Colorectal Cancer  This type of cancer can be detected and often prevented.  Routine colorectal cancer screening usually begins at 42 years of age and continues through 42 years of age.  Your health care provider may recommend screening at an earlier age if you have risk factors for colon cancer.  Your health care provider may also recommend using home test kits to check for hidden blood in the stool.  A small camera at the end of a tube can be used to examine your colon directly (sigmoidoscopy or colonoscopy). This is done to check for the earliest forms of colorectal  cancer.  Routine screening usually begins at age 50.  Direct examination of the colon should be repeated every 5-10 years through 42 years of age. However, you may need to be screened more often if early forms of precancerous polyps or small growths are found. Skin Cancer  Check your skin from head to toe regularly.  Tell your health care provider about any new moles or changes in moles, especially if there is a change in a mole's shape or color.  Also tell your health care provider if you have a mole that is larger than the size of a pencil eraser.  Always use sunscreen. Apply sunscreen liberally and repeatedly throughout the day.  Protect yourself by wearing long sleeves, pants, a wide-brimmed hat, and sunglasses whenever you are outside. HEART DISEASE, DIABETES, AND HIGH BLOOD PRESSURE   High blood pressure causes heart disease and increases the risk of stroke. High blood pressure is more likely to develop in:  People who have blood pressure in the high end   of the normal range (130-139/85-89 mm Hg).  People who are overweight or obese.  People who are African American.  If you are 38-23 years of age, have your blood pressure checked every 3-5 years. If you are 61 years of age or older, have your blood pressure checked every year. You should have your blood pressure measured twice--once when you are at a hospital or clinic, and once when you are not at a hospital or clinic. Record the average of the two measurements. To check your blood pressure when you are not at a hospital or clinic, you can use:  An automated blood pressure machine at a pharmacy.  A home blood pressure monitor.  If you are between 45 years and 39 years old, ask your health care provider if you should take aspirin to prevent strokes.  Have regular diabetes screenings. This involves taking a blood sample to check your fasting blood sugar level.  If you are at a normal weight and have a low risk for diabetes,  have this test once every three years after 42 years of age.  If you are overweight and have a high risk for diabetes, consider being tested at a younger age or more often. PREVENTING INFECTION  Hepatitis B  If you have a higher risk for hepatitis B, you should be screened for this virus. You are considered at high risk for hepatitis B if:  You were born in a country where hepatitis B is common. Ask your health care provider which countries are considered high risk.  Your parents were born in a high-risk country, and you have not been immunized against hepatitis B (hepatitis B vaccine).  You have HIV or AIDS.  You use needles to inject street drugs.  You live with someone who has hepatitis B.  You have had sex with someone who has hepatitis B.  You get hemodialysis treatment.  You take certain medicines for conditions, including cancer, organ transplantation, and autoimmune conditions. Hepatitis C  Blood testing is recommended for:  Everyone born from [birth year] through 1967.  Anyone with known risk factors for hepatitis C. Sexually transmitted infections (STIs)  You should be screened for sexually transmitted infections (STIs) including gonorrhea and chlamydia if:  You are sexually active and are younger than 42 years of age.  You are older than 42 years of age and your health care provider tells you that you are at risk for this type of infection.  Your sexual activity has changed since you were last screened and you are at an increased risk for chlamydia or gonorrhea. Ask your health care provider if you are at risk.  If you do not have HIV, but are at risk, it may be recommended that you take a prescription medicine daily to prevent HIV infection. This is called pre-exposure prophylaxis (PrEP). You are considered at risk if:  You are sexually active and do not regularly use condoms or know the HIV status of your partner(s).  You take drugs by injection.  You are sexually  active with a partner who has HIV. Talk with your health care provider about whether you are at high risk of being infected with HIV. If you choose to begin PrEP, you should first be tested for HIV. You should then be tested every 3 months for as long as you are taking PrEP.  PREGNANCY   If you are premenopausal and you may become pregnant, ask your health care provider about preconception counseling.  If you may  become pregnant, take 400 to 800 micrograms (mcg) of folic acid every day.  If you want to prevent pregnancy, talk to your health care provider about birth control (contraception). OSTEOPOROSIS AND MENOPAUSE   Osteoporosis is a disease in which the bones lose minerals and strength with aging. This can result in serious bone fractures. Your risk for osteoporosis can be identified using a bone density scan.  If you are 61 years of age or older, or if you are at risk for osteoporosis and fractures, ask your health care provider if you should be screened.  Ask your health care provider whether you should take a calcium or vitamin D supplement to lower your risk for osteoporosis.  Menopause may have certain physical symptoms and risks.  Hormone replacement therapy may reduce some of these symptoms and risks. Talk to your health care provider about whether hormone replacement therapy is right for you.  HOME CARE INSTRUCTIONS   Schedule regular health, dental, and eye exams.  Stay current with your immunizations.   Do not use any tobacco products including cigarettes, chewing tobacco, or electronic cigarettes.  If you are pregnant, do not drink alcohol.  If you are breastfeeding, limit how much and how often you drink alcohol.  Limit alcohol intake to no more than 1 drink per day for nonpregnant women. One drink equals 12 ounces of beer, 5 ounces of wine, or 1 ounces of hard liquor.  Do not use street drugs.  Do not share needles.  Ask your health care provider for help if  you need support or information about quitting drugs.  Tell your health care provider if you often feel depressed.  Tell your health care provider if you have ever been abused or do not feel safe at home.   This information is not intended to replace advice given to you by your health care provider. Make sure you discuss any questions you have with your health care provider.   Document Released: 01/08/2011 Document Revised: 07/16/2014 Document Reviewed: 05/27/2013 Elsevier Interactive Patient Education [employer].

## 2015-12-16 LAB — LIPID PANEL
CHOL/HDL RATIO: 3.4 ratio (ref 0.0–4.4)
Cholesterol, Total: 164 mg/dL (ref 100–199)
HDL: 48 mg/dL (ref >39)
LDL CALC: 93 mg/dL (ref 0–99)
TRIGLYCERIDES: 114 mg/dL (ref 0–149)
VLDL Cholesterol Cal: 23 mg/dL (ref 5–40)

## 2015-12-16 LAB — ANEMIA PROFILE B
BASOS: 0 %
Basophils Absolute: 0 10*3/uL (ref 0.0–0.2)
EOS (ABSOLUTE): 0.1 10*3/uL (ref 0.0–0.4)
EOS: 2 %
FERRITIN: 24 ng/mL (ref 15–150)
FOLATE: 15.8 ng/mL (ref >3.0)
HEMATOCRIT: 41.1 % (ref 34.0–46.6)
HEMOGLOBIN: 13.7 g/dL (ref 11.1–15.9)
IMMATURE GRANULOCYTES: 0 %
IRON SATURATION: 19 % (ref 15–55)
IRON: 58 ug/dL (ref 27–159)
Immature Grans (Abs): 0 10*3/uL (ref 0.0–0.1)
LYMPHS ABS: 2.1 10*3/uL (ref 0.7–3.1)
Lymphs: 30 %
MCH: 30.6 pg (ref 26.6–33.0)
MCHC: 33.3 g/dL (ref 31.5–35.7)
MCV: 92 fL (ref 79–97)
MONOS ABS: 0.5 10*3/uL (ref 0.1–0.9)
Monocytes: 7 %
NEUTROS PCT: 61 %
Neutrophils Absolute: 4.3 10*3/uL (ref 1.4–7.0)
Platelets: 212 10*3/uL (ref 150–379)
RBC: 4.48 x10E6/uL (ref 3.77–5.28)
RDW: 13.3 % (ref 12.3–15.4)
Retic Ct Pct: 1.8 % (ref 0.6–2.6)
TIBC: 298 ug/dL (ref 250–450)
UIBC: 240 ug/dL (ref 131–425)
Vitamin B-12: 1541 pg/mL — ABNORMAL HIGH (ref 211–946)
WBC: 6.9 10*3/uL (ref 3.4–10.8)

## 2015-12-16 LAB — CMP14+EGFR
A/G RATIO: 1.4 (ref 1.2–2.2)
ALK PHOS: 86 IU/L (ref 39–117)
ALT: 22 IU/L (ref 0–32)
AST: 17 IU/L (ref 0–40)
Albumin: 4.2 g/dL (ref 3.5–5.5)
BUN/Creatinine Ratio: 17 (ref 9–23)
BUN: 14 mg/dL (ref 6–24)
Bilirubin Total: 0.3 mg/dL (ref 0.0–1.2)
CO2: 26 mmol/L (ref 18–29)
CREATININE: 0.84 mg/dL (ref 0.57–1.00)
Calcium: 9.5 mg/dL (ref 8.7–10.2)
Chloride: 99 mmol/L (ref 96–106)
GFR calc Af Amer: 100 mL/min/{1.73_m2} (ref >59)
GFR, EST NON AFRICAN AMERICAN: 87 mL/min/{1.73_m2} (ref >59)
GLOBULIN, TOTAL: 3.1 g/dL (ref 1.5–4.5)
Glucose: 88 mg/dL (ref 65–99)
Potassium: 3.6 mmol/L (ref 3.5–5.2)
SODIUM: 142 mmol/L (ref 134–144)
Total Protein: 7.3 g/dL (ref 6.0–8.5)

## 2015-12-16 LAB — THYROID PANEL WITH TSH
Free Thyroxine Index: 2.2 (ref 1.2–4.9)
T3 Uptake Ratio: 27 % (ref 24–39)
T4 TOTAL: 8.1 ug/dL (ref 4.5–12.0)
TSH: 0.56 u[IU]/mL (ref 0.450–4.500)

## 2015-12-16 LAB — MEASLES/MUMPS/RUBELLA IMMUNITY
MUMPS ABS, IGG: 180 [AU]/ml (ref >10.9)
RUBELLA: 2.31 {index} (ref >0.99)
RUBEOLA AB, IGG: 154 AU/mL (ref >29.9)

## 2015-12-16 LAB — VARICELLA ZOSTER ANTIBODY, IGG: VARICELLA: 2931 {index} (ref >165)

## 2015-12-16 LAB — HEPATITIS B SURFACE ANTIBODY,QUALITATIVE: HEP B SURFACE AB, QUAL: REACTIVE

## 2015-12-16 LAB — VITAMIN D 25 HYDROXY (VIT D DEFICIENCY, FRACTURES): VIT D 25 HYDROXY: 48.2 ng/mL (ref 30.0–100.0)

## 2015-12-17 LAB — PAP IG W/ RFLX HPV ASCU: PAP Smear Comment: 0

## 2015-12-19 ENCOUNTER — Telehealth: Payer: Self-pay | Admitting: Family

## 2015-12-19 NOTE — Telephone Encounter (Signed)
Patient aware of results.

## 2015-12-20 ENCOUNTER — Ambulatory Visit (INDEPENDENT_AMBULATORY_CARE_PROVIDER_SITE_OTHER): Payer: Medicaid Other | Admitting: *Deleted

## 2015-12-20 DIAGNOSIS — Z111 Encounter for screening for respiratory tuberculosis: Secondary | ICD-10-CM | POA: Diagnosis not present

## 2015-12-20 MED ORDER — TUBERCULIN PPD 5 UNIT/0.1ML ID SOLN: 5.0000 [IU] | Freq: Once | INTRADERMAL | Status: DC

## 2015-12-20 NOTE — Progress Notes (Signed)
Pt came in today for PPD. PPD place intradermally in left forearm and pt tolerated well.

## 2015-12-20 NOTE — Patient Instructions (Signed)
Tuberculin Skin Test WHY AM I HAVING THIS TEST? Tuberculosis (TB) is a bacterial infection caused by Mycobacterium tuberculosis. Most people who are exposed to these bacteria have a strong enough defense (immune) system to prevent the bacteria from causing TB and developing symptoms. Their bodies prevent the germs from being active and making them sick (latent TB infection).  However, if you have TB germs in your body and your immune system is weak, you can develop a TB infection. This can cause symptoms such as:   Night sweats.  Fever.  Weakness.  Weight loss. A latent TB infection can also become active later in life if your immune system becomes weakened or compromised. You may have this test if your health care provider suspects that you have TB. You may also have this test to screen for TB if you are at risk for getting the disease. Those at increased risk include:  People who inject illegal drugs or share needles.  People with HIV or other diseases that affect immunity.  Health care workers.  People who live in high-risk communities, such as homeless shelters, nursing homes, and correctional facilities.  People who have been in contact with someone with TB.  People from countries where TB is more common. If you are in a high-risk group, your health care provider may wish to screen for TB more often. This can help prevent the spread of the disease. Sometimes TB screening is required when starting a new job, such as becoming a health care worker or a teacher. Colleges or universities may require it of new students. HOW WILL I BE TESTED? A tuberculin skin test is the main test used to check for exposure to the bacteria that can cause TB. The test checks for antibodies to the bacteria. Antibodies are proteins that your body produces to protect you from germs and other things that can make you sick. Your health care provider will inject a solution known as PPD (purified protein  derivative) under the first layer of skin on your arm. This causes a blister-like bubble to form at the site. Your health care provider will then examine the site after a number of hours have passed to see if a reaction has occurred. HOW DO I PREPARE FOR THE TEST? There is no preparation required for this test. WHAT DO THE RESULTS MEAN? Your test results will be reported as either negative or positive.  If the tuberculin skin test produces a negative result, it is likely that you do not have TB and have not been exposed to the TB bacteria. If you or your health care provider suspects exposure, however, you may want to repeat the test a few weeks later. A blood test may also be used to check for TB. This is because you will not react to the tuberculin skin test until several weeks after exposure to TB bacteria. If you test positive to the tuberculin skin test, it is likely that you have been exposed to TB bacteria. The test does not distinguish between an active and a latent TB infection. A false-positive result can occur. A false-positive result for TB bacteria is incorrect because it indicates a condition or finding is present when it is not. Talk to your health care provider to discuss your results, treatment options, and if necessary, the need for more tests. It is your responsibility to obtain your test results. Ask the lab or department performing the test when and how you will get your results. Talk with your   health care provider if you have any questions about your results.   This information is not intended to replace advice given to you by your health care provider. Make sure you discuss any questions you have with your health care provider.   Document Released: 04/04/2005 Document Revised: 07/16/2014 Document Reviewed: 10/19/2013 Elsevier Interactive Patient Education 2016 [employer] Inc[employer]  

## 2015-12-27 DIAGNOSIS — Z96651 Presence of right artificial knee joint: Secondary | ICD-10-CM | POA: Diagnosis not present

## 2015-12-27 DIAGNOSIS — Z471 Aftercare following joint replacement surgery: Secondary | ICD-10-CM | POA: Diagnosis not present

## 2016-01-17 ENCOUNTER — Telehealth: Payer: Self-pay

## 2016-01-17 ENCOUNTER — Other Ambulatory Visit: Payer: Self-pay

## 2016-01-17 ENCOUNTER — Other Ambulatory Visit: Payer: Medicaid Other

## 2016-01-17 DIAGNOSIS — Z111 Encounter for screening for respiratory tuberculosis: Secondary | ICD-10-CM | POA: Diagnosis not present

## 2016-01-17 NOTE — Telephone Encounter (Signed)
Last seen and last VIT D 12/15/15  Christy   48.2

## 2016-01-17 NOTE — Telephone Encounter (Signed)
Medicaid does not pay for Drysol

## 2016-01-18 MED ORDER — VITAMIN D (ERGOCALCIFEROL) 1.25 MG (50000 UNIT) PO CAPS: 50000.0000 [IU] | ORAL_CAPSULE | ORAL | Status: DC

## 2016-01-19 LAB — QUANTIFERON IN TUBE
QFT TB AG MINUS NIL VALUE: <0 [IU]/mL
QUANTIFERON MITOGEN VALUE: >10 [IU]/mL
QUANTIFERON TB AG VALUE: 0.03 [IU]/mL
QUANTIFERON TB GOLD: NEGATIVE
Quantiferon Nil Value: 0.05 [IU]/mL

## 2016-01-19 LAB — QUANTIFERON TB GOLD ASSAY (BLOOD)

## 2016-03-06 ENCOUNTER — Encounter: Payer: Self-pay | Admitting: Nurse Practitioner

## 2016-03-06 ENCOUNTER — Ambulatory Visit (INDEPENDENT_AMBULATORY_CARE_PROVIDER_SITE_OTHER): Payer: Medicaid Other | Admitting: Nurse Practitioner

## 2016-03-06 VITALS — BP 138/82 | HR 74 | Temp 97.8°F | Ht 67.0 in | Wt 260.0 lb

## 2016-03-06 DIAGNOSIS — G44209 Tension-type headache, unspecified, not intractable: Secondary | ICD-10-CM | POA: Diagnosis not present

## 2016-03-06 MED ORDER — KETOROLAC TROMETHAMINE 60 MG/2ML IM SOLN
60.0000 mg | Freq: Once | INTRAMUSCULAR | Status: AC
  Administered 2016-03-06: 60 mg via INTRAMUSCULAR

## 2016-03-06 MED ORDER — IBUPROFEN 800 MG PO TABS: 800.0000 mg | ORAL_TABLET | Freq: Three times a day (TID) | ORAL | 0 refills | Status: DC | PRN

## 2016-03-06 NOTE — Patient Instructions (Signed)
Tension Headache A tension headache is pain, pressure, or aching that is felt over the front and sides of your head. These headaches can last from 30 minutes to several days. HOME CARE Managing Pain  Take over-the-counter and prescription medicines only as told by your doctor.  Lie down in a dark, quiet room when you have a headache.  If directed, apply ice to your head and neck area:  Put ice in a plastic bag.  Place a towel between your skin and the bag.  Leave the ice on for 20 minutes, 2-3 times per day.  Use a heating pad or a hot shower to apply heat to your head and neck area as told by your doctor. Eating and Drinking  Eat meals on a regular schedule.  Do not drink a lot of alcohol.  Do not use a lot of caffeine, or stop using caffeine. General Instructions  Keep all follow-up visits as told by your doctor. This is important.  Keep a journal to find out if certain things bring on headaches. For example, write down:  What you eat and drink.  How much sleep you get.  Any change to your diet or medicines.  Try getting a massage, or doing other things that help you to relax.  Lessen stress.  Sit up straight. Do not tighten (tense) your muscles.  Do not use tobacco products. This includes cigarettes, chewing tobacco, or e-cigarettes. If you need help quitting, ask your doctor.  Exercise regularly as told by your doctor.  Get enough sleep. This may mean 7-9 hours of sleep. GET HELP IF:  Your symptoms are not helped by medicine.  You have a headache that feels different from your usual headache.  You feel sick to your stomach (nauseous) or you throw up (vomit).  You have a fever. GET HELP RIGHT AWAY IF:  Your headache becomes very bad.  You keep throwing up.  You have a stiff neck.  You have trouble seeing.  You have trouble speaking.  You have pain in your eye or ear.  Your muscles are weak or you lose muscle control.  You lose your balance  or you have trouble walking.  You feel like you will pass out (faint) or you pass out.  You have confusion.   This information is not intended to replace advice given to you by your health care provider. Make sure you discuss any questions you have with your health care provider.   Document Released: 09/19/2009 Document Revised: 03/16/2015 Document Reviewed: 10/18/2014 Elsevier Interactive Patient Education 2016 [employer] Inc.  

## 2016-03-06 NOTE — Progress Notes (Signed)
   Subjective:    Patient ID: Carol Vargas, female    DOB: 1976-01-11, 40 y.o.   MRN: [id number]  Headache   This is a recurrent problem. The current episode started yesterday. The problem occurs constantly. The problem has been gradually worsening. The pain is located in the frontal and parietal region. The pain radiates to the right neck and right arm. The pain quality is not similar to prior headaches. The quality of the pain is described as pulsating. The pain is at a severity of 7/10. The pain is severe. Associated symptoms include insomnia, nausea, neck pain and scalp tenderness. Nothing aggravates the symptoms. She has tried acetaminophen and NSAIDs for the symptoms. The treatment provided no relief.  Neck Pain   Associated symptoms include headaches. Pertinent negatives include no chest pain.      Review of Systems  Respiratory: Negative.  Negative for shortness of breath.   Cardiovascular: Negative.  Negative for chest pain.  Gastrointestinal: Positive for nausea.  Musculoskeletal: Positive for neck pain.  Neurological: Positive for headaches. Negative for speech difficulty.  Psychiatric/Behavioral: The patient has insomnia.        Objective:   Physical Exam  Constitutional: She is oriented to person, place, and time. She appears well-developed and well-nourished.  HENT:  Head: Normocephalic and atraumatic.  Eyes: Conjunctivae and EOM are normal. Pupils are equal, round, and reactive to light.  Neck: Normal range of motion. Neck supple.  Cardiovascular: Normal rate and regular rhythm.   Pulmonary/Chest: Effort normal and breath sounds normal.  Musculoskeletal:  FROM of cervical spine without pain Motor function upper and lower extremies intact DTR intact  Lymphadenopathy:    She has no cervical adenopathy.  Neurological: She is alert and oriented to person, place, and time. No cranial nerve deficit.  Psychiatric: She has a normal mood and affect. Her behavior is normal.  Judgment and thought content normal.    BP 138/82 (BP Location: Left Arm, Cuff Size: Large)   Pulse 74   Temp 97.8 F (36.6 C) (Oral)   Ht 5\' 7"  (1.702 m)   Wt 260 lb (117.9 kg)   BMI 40.72 kg/m         Assessment & Plan:   1. Tension headache    Meds ordered this encounter  Medications  . ketorolac (TORADOL) injection 60 mg  . ibuprofen (ADVIL,MOTRIN) 800 MG tablet    Sig: Take 1 tablet (800 mg total) by mouth every 8 (eight) hours as needed.    Dispense:  30 tablet    Refill:  0    Order Specific Question:   Supervising Provider    Answer:   Eustaquio Maize [[id number]]   Go home and rest Avoid caffeine if no improvement by tomorrow will order Head CT  Mary-Margaret Hassell Done, FNP

## 2016-04-03 ENCOUNTER — Other Ambulatory Visit: Payer: Self-pay

## 2016-04-03 MED ORDER — ATORVASTATIN CALCIUM 20 MG PO TABS: 20.0000 mg | ORAL_TABLET | Freq: Every day | ORAL | 0 refills | Status: DC

## 2016-04-09 ENCOUNTER — Ambulatory Visit: Payer: Medicaid Other

## 2016-05-02 ENCOUNTER — Ambulatory Visit: Payer: Medicaid Other | Admitting: Family

## 2016-05-08 ENCOUNTER — Other Ambulatory Visit: Payer: Self-pay

## 2016-05-08 ENCOUNTER — Encounter: Payer: Self-pay | Admitting: Family

## 2016-05-08 ENCOUNTER — Ambulatory Visit (INDEPENDENT_AMBULATORY_CARE_PROVIDER_SITE_OTHER): Payer: Medicaid Other | Admitting: Family

## 2016-05-08 VITALS — BP 187/116 | HR 80 | Temp 97.8°F | Ht 67.0 in | Wt 255.6 lb

## 2016-05-08 DIAGNOSIS — E8881 Metabolic syndrome: Secondary | ICD-10-CM

## 2016-05-08 DIAGNOSIS — I10 Essential (primary) hypertension: Secondary | ICD-10-CM | POA: Diagnosis not present

## 2016-05-08 DIAGNOSIS — E782 Mixed hyperlipidemia: Secondary | ICD-10-CM

## 2016-05-08 MED ORDER — VITAMIN D (ERGOCALCIFEROL) 1.25 MG (50000 UNIT) PO CAPS: 50000.0000 [IU] | ORAL_CAPSULE | ORAL | 0 refills | Status: DC

## 2016-05-08 MED ORDER — LOSARTAN POTASSIUM-HCTZ 100-25 MG PO TABS: 1.0000 | ORAL_TABLET | Freq: Every day | ORAL | 1 refills | Status: DC

## 2016-05-08 NOTE — Progress Notes (Signed)
   Subjective:    Patient ID: Carol Vargas, female    DOB: [birth year], 40 y.o.   MRN: [id number]  Pt presents to the office today requesting medication to help her lose weight. Pt's current BMI is 40.03. Pt states she has tried to do diet and exercise at home with no success. Pt states she stays hungry at all times and hopes a medication will help her with this. PT currently has metabolic syndrome, hyperlipemia, and HTN. PT's BP is extremely elevated today.  Hypertension  This is a chronic problem. The current episode started more than 1 year ago. The problem has been waxing and waning since onset. The problem is uncontrolled. Pertinent negatives include no blurred vision, headaches, palpitations, peripheral edema, PND or shortness of breath. Risk factors for coronary artery disease include family history and obesity. Past treatments include angiotensin blockers and diuretics. The current treatment provides no improvement. There is no history of kidney disease, CAD/MI, CVA or heart failure.       Review of Systems  Eyes: Negative for blurred vision.  Respiratory: Negative for shortness of breath.   Cardiovascular: Negative for palpitations and PND.  Neurological: Negative for headaches.  All other systems reviewed and are negative.      Objective:   Physical Exam  Constitutional: She is oriented to person, place, and time. She appears well-developed and well-nourished. No distress.  Cardiovascular: Normal rate, regular rhythm, normal heart sounds and intact distal pulses.   No murmur heard. Pulmonary/Chest: Effort normal and breath sounds normal. No respiratory distress. She has no wheezes.  Abdominal: Soft. Bowel sounds are normal. She exhibits no distension. There is no tenderness.  Musculoskeletal: Normal range of motion. She exhibits no edema or tenderness.  Neurological: She is alert and oriented to person, place, and time. She has normal reflexes. No cranial nerve deficit.  Skin:  Skin is warm and dry.  Psychiatric: She has a normal mood and affect. Her behavior is normal. Judgment and thought content normal.  Vitals reviewed.     BP (!) 174/124   Pulse 85   Temp 97.8 F (36.6 C) (Oral)   Ht _0  (1.702 m)   Wt 255 lb 9.6 oz (115.9 kg)   BMI 40.03 kg/m      Assessment & Plan:  1. Metabolic syndrome - TYO06+YOKH  2. Severe obesity (BMI >= 40) (HCC) -Exercise and diet encouraged -Will wait until BP is WNL and will start phentermine - CMP14+EGFR  3. Essential hypertension -PT's losartan-hctz increased to 100-25 mg from 50-12.5 mg -Daily blood pressure log given with instructions on how to fill out and told to bring to next visit -Dash diet information given -Exercise encouraged - Stress Management  -Continue current meds -RTO in 2 weeks to recheck, if at goal will give phentermine rx - CMP14+EGFR - losartan-hydrochlorothiazide (HYZAAR) 100-25 MG tablet; Take 1 tablet by mouth daily.  Dispense: 90 tablet; Refill: 1  4. Mixed hyperlipidemia - CMP14+EGFR  Evelina Dun, FNP

## 2016-05-08 NOTE — Patient Instructions (Signed)
Exercising to Lose Weight Exercising can help you to lose weight. In order to lose weight through exercise, you need to do vigorous-intensity exercise. You can tell that you are exercising with vigorous intensity if you are breathing very hard and fast and cannot hold a conversation while exercising. Moderate-intensity exercise helps to maintain your current weight. You can tell that you are exercising at a moderate level if you have a higher heart rate and faster breathing, but you are still able to hold a conversation. HOW OFTEN SHOULD I EXERCISE? Choose an activity that you enjoy and set realistic goals. Your health care provider can help you to make an activity plan that works for you. Exercise regularly as directed by your health care provider. This may include:  Doing resistance training twice each week, such as:  Push-ups.  Sit-ups.  Lifting weights.  Using resistance bands.  Doing a given intensity of exercise for a given amount of time. Choose from these options:  150 minutes of moderate-intensity exercise every week.  75 minutes of vigorous-intensity exercise every week.  A mix of moderate-intensity and vigorous-intensity exercise every week. Children, pregnant women, people who are out of shape, people who are overweight, and older adults may need to consult a health care provider for individual recommendations. If you have any sort of medical condition, be sure to consult your health care provider before starting a new exercise program. WHAT ARE SOME ACTIVITIES THAT CAN HELP ME TO LOSE WEIGHT?   Walking at a rate of at least 4.5 miles an hour.  Jogging or running at a rate of 5 miles per hour.  Biking at a rate of at least 10 miles per hour.  Lap swimming.  Roller-skating or in-line skating.  Cross-country skiing.  Vigorous competitive sports, such as football, basketball, and soccer.  Jumping rope.  Aerobic dancing. HOW CAN I BE MORE ACTIVE IN MY DAY-TO-DAY  ACTIVITIES?  Use the stairs instead of the elevator.  Take a walk during your lunch break.  If you drive, park your car farther away from work or school.  If you take public transportation, get off one stop early and walk the rest of the way.  Make all of your phone calls while standing up and walking around.  Get up, stretch, and walk around every 30 minutes throughout the day. WHAT GUIDELINES SHOULD I FOLLOW WHILE EXERCISING?  Do not exercise so much that you hurt yourself, feel dizzy, or get very short of breath.  Consult your health care provider prior to starting a new exercise program.  Wear comfortable clothes and shoes with good support.  Drink plenty of water while you exercise to prevent dehydration or heat stroke. Body water is lost during exercise and must be replaced.  Work out until you breathe faster and your heart beats faster.   This information is not intended to replace advice given to you by your health care provider. Make sure you discuss any questions you have with your health care provider.   Document Released: 07/28/2010 Document Revised: 07/16/2014 Document Reviewed: 11/26/2013 Elsevier Interactive Patient Education 2016 [employer] Inc[employer] Calorie Counting for Weight Loss Calories are energy you get from the things you eat and drink. Your body uses this energy to keep you going throughout the day. The number of calories you eat affects your weight. When you eat more calories than your body needs, your body stores the extra calories as fat. When you eat fewer calories than your body needs, your body burns   fat to get the energy it needs. Calorie counting means keeping track of how many calories you eat and drink each day. If you make sure to eat fewer calories than your body needs, you should lose weight. In order for calorie counting to work, you will need to eat the number of calories that are right for you in a day to lose a healthy amount of weight per week. A  healthy amount of weight to lose per week is usually 1-2 lb (0.5-0.9 kg). A dietitian can determine how many calories you need in a day and give you suggestions on how to reach your calorie goal.  WHAT IS MY MY PLAN? My goal is to have __________ calories per day.  If I have this many calories per day, I should lose around __________ pounds per week. WHAT DO I NEED TO KNOW ABOUT CALORIE COUNTING? In order to meet your daily calorie goal, you will need to:  Find out how many calories are in each food you would like to eat. Try to do this before you eat.  Decide how much of the food you can eat.  Write down what you ate and how many calories it had. Doing this is called keeping a food log. WHERE DO I FIND CALORIE INFORMATION? The number of calories in a food can be found on a Nutrition Facts label. Note that all the information on a label is based on a specific serving of the food. If a food does not have a Nutrition Facts label, try to look up the calories online or ask your dietitian for help. HOW DO I DECIDE HOW MUCH TO EAT? To decide how much of the food you can eat, you will need to consider both the number of calories in one serving and the size of one serving. This information can be found on the Nutrition Facts label. If a food does not have a Nutrition Facts label, look up the information online or ask your dietitian for help. Remember that calories are listed per serving. If you choose to have more than one serving of a food, you will have to multiply the calories per serving by the amount of servings you plan to eat. For example, the label on a package of bread might say that a serving size is 1 slice and that there are 90 calories in a serving. If you eat 1 slice, you will have eaten 90 calories. If you eat 2 slices, you will have eaten 180 calories. HOW DO I KEEP A FOOD LOG? After each meal, record the following information in your food log:  What you ate.  How much of it you  ate.  How many calories it had.  Then, add up your calories. Keep your food log near you, such as in a small notebook in your pocket. Another option is to use a mobile app or website. Some programs will calculate calories for you and show you how many calories you have left each time you add an item to the log. WHAT ARE SOME CALORIE COUNTING TIPS?  Use your calories on foods and drinks that will fill you up and not leave you hungry. Some examples of this include foods like nuts and nut butters, vegetables, lean proteins, and high-fiber foods (more than 5 g fiber per serving).  Eat nutritious foods and avoid empty calories. Empty calories are calories you get from foods or beverages that do not have many nutrients, such as candy and soda. It   is better to have a nutritious high-calorie food (such as an avocado) than a food with few nutrients (such as a bag of chips).  Know how many calories are in the foods you eat most often. This way, you do not have to look up how many calories they have each time you eat them.  Look out for foods that may seem like low-calorie foods but are really high-calorie foods, such as baked goods, soda, and fat-free candy.  Pay attention to calories in drinks. Drinks such as sodas, specialty coffee drinks, alcohol, and juices have a lot of calories yet do not fill you up. Choose low-calorie drinks like water and diet drinks.  Focus your calorie counting efforts on higher calorie items. Logging the calories in a garden salad that contains only vegetables is less important than calculating the calories in a milk shake.  Find a way of tracking calories that works for you. Get creative. Most people who are successful find ways to keep track of how much they eat in a day, even if they do not count every calorie. WHAT ARE SOME PORTION CONTROL TIPS?  Know how many calories are in a serving. This will help you know how many servings of a certain food you can have.  Use a  measuring cup to measure serving sizes. This is helpful when you start out. With time, you will be able to estimate serving sizes for some foods.  Take some time to put servings of different foods on your favorite plates, bowls, and cups so you know what a serving looks like.  Try not to eat straight from a bag or box. Doing this can lead to overeating. Put the amount you would like to eat in a cup or on a plate to make sure you are eating the right portion.  Use smaller plates, glasses, and bowls to prevent overeating. This is a quick and easy way to practice portion control. If your plate is smaller, less food can fit on it.  Try not to multitask while eating, such as watching TV or using your computer. If it is time to eat, sit down at a table and enjoy your food. Doing this will help you to start recognizing when you are full. It will also make you more aware of what and how much you are eating. HOW CAN I CALORIE COUNT WHEN EATING OUT?  Ask for smaller portion sizes or child-sized portions.  Consider sharing an entree and sides instead of getting your own entree.  If you get your own entree, eat only half. Ask for a box at the beginning of your meal and put the rest of your entree in it so you are not tempted to eat it.  Look for the calories on the menu. If calories are listed, choose the lower calorie options.  Choose dishes that include vegetables, fruits, whole grains, low-fat dairy products, and lean protein. Focusing on smart food choices from each of the 5 food groups can help you stay on track at restaurants.  Choose items that are boiled, broiled, grilled, or steamed.  Choose water, milk, unsweetened iced tea, or other drinks without added sugars. If you want an alcoholic beverage, choose a lower calorie option. For example, a regular margarita can have up to 700 calories and a glass of wine has around 150.  Stay away from items that are buttered, battered, fried, or served with  cream sauce. Items labeled "crispy" are usually fried, unless stated otherwise.    Ask for dressings, sauces, and syrups on the side. These are usually very high in calories, so do not eat much of them.  Watch out for salads. Many people think salads are a healthy option, but this is often not the case. Many salads come with bacon, fried chicken, lots of cheese, fried chips, and dressing. All of these items have a lot of calories. If you want a salad, choose a garden salad and ask for grilled meats or steak. Ask for the dressing on the side, or ask for olive oil and vinegar or lemon to use as dressing.  Estimate how many servings of a food you are given. For example, a serving of cooked rice is  cup or about the size of half a tennis ball or one cupcake wrapper. Knowing serving sizes will help you be aware of how much food you are eating at restaurants. The list below tells you how big or small some common portion sizes are based on everyday objects.  1 oz--4 stacked dice.  3 oz--1 deck of cards.  1 tsp--1 dice.  1 Tbsp-- a Ping-Pong ball.  2 Tbsp--1 Ping-Pong ball.   cup--1 tennis ball or 1 cupcake wrapper.  1 cup--1 baseball.   This information is not intended to replace advice given to you by your health care provider. Make sure you discuss any questions you have with your health care provider.   Document Released: 06/25/2005 Document Revised: 07/16/2014 Document Reviewed: 04/30/2013 Elsevier Interactive Patient Education 2016 [employer] Inc[employer]  

## 2016-05-09 LAB — CMP14+EGFR
ALBUMIN: 4 g/dL (ref 3.5–5.5)
ALK PHOS: 76 IU/L (ref 39–117)
ALT: 28 IU/L (ref 0–32)
AST: 23 IU/L (ref 0–40)
Albumin/Globulin Ratio: 1.2 (ref 1.2–2.2)
BUN / CREAT RATIO: 15 (ref 9–23)
BUN: 12 mg/dL (ref 6–24)
CHLORIDE: 100 mmol/L (ref 96–106)
CO2: 23 mmol/L (ref 18–29)
CREATININE: 0.78 mg/dL (ref 0.57–1.00)
Calcium: 9.3 mg/dL (ref 8.7–10.2)
GFR calc Af Amer: 108 mL/min/{1.73_m2} (ref >59)
GFR calc non Af Amer: 94 mL/min/{1.73_m2} (ref >59)
GLUCOSE: 91 mg/dL (ref 65–99)
Globulin, Total: 3.3 g/dL (ref 1.5–4.5)
Potassium: 3.8 mmol/L (ref 3.5–5.2)
Sodium: 142 mmol/L (ref 134–144)
Total Protein: 7.3 g/dL (ref 6.0–8.5)

## 2016-05-22 ENCOUNTER — Ambulatory Visit (INDEPENDENT_AMBULATORY_CARE_PROVIDER_SITE_OTHER): Payer: Medicaid Other | Admitting: Family

## 2016-05-22 ENCOUNTER — Encounter: Payer: Self-pay | Admitting: Family

## 2016-05-22 VITALS — BP 175/111 | HR 86 | Temp 98.5°F | Ht 67.0 in | Wt 255.0 lb

## 2016-05-22 DIAGNOSIS — I10 Essential (primary) hypertension: Secondary | ICD-10-CM | POA: Diagnosis not present

## 2016-05-22 MED ORDER — AMLODIPINE BESYLATE 5 MG PO TABS: 5.0000 mg | ORAL_TABLET | Freq: Every day | ORAL | 3 refills | Status: DC

## 2016-05-22 NOTE — Progress Notes (Signed)
   Subjective:    Patient ID: Carol Vargas, female    DOB: [birth year], 40 y.o.   MRN: [id number]  Hypertension  This is a chronic problem. The current episode started more than 1 year ago. The problem has been waxing and waning since onset. The problem is uncontrolled. Pertinent negatives include no headaches, malaise/fatigue, palpitations, peripheral edema or shortness of breath. Risk factors for coronary artery disease include dyslipidemia, obesity and sedentary lifestyle. Past treatments include diuretics and angiotensin blockers. The current treatment provides mild improvement. There is no history of kidney disease, CAD/MI, CVA or heart failure.      Review of Systems  Constitutional: Negative for malaise/fatigue.  Respiratory: Negative for shortness of breath.   Cardiovascular: Negative for palpitations.  Neurological: Negative for headaches.  All other systems reviewed and are negative.      Objective:   Physical Exam  Constitutional: She is oriented to person, place, and time. She appears well-developed and well-nourished. No distress.  Eyes: Pupils are equal, round, and reactive to light.  Neck: Normal range of motion. Neck supple. No thyromegaly present.  Cardiovascular: Normal rate, regular rhythm, normal heart sounds and intact distal pulses.   No murmur heard. Pulmonary/Chest: Effort normal and breath sounds normal. No respiratory distress. She has no wheezes.  Abdominal: Soft. Bowel sounds are normal. She exhibits no distension. There is no tenderness.  Musculoskeletal: Normal range of motion. She exhibits no edema or tenderness.  Neurological: She is alert and oriented to person, place, and time.  Skin: Skin is warm and dry.  Psychiatric: She has a normal mood and affect. Her behavior is normal. Judgment and thought content normal.  Vitals reviewed.     BP (!) 166/110   Pulse 89   Temp 98.5 F (36.9 C) (Oral)   Ht _0  (1.702 m)   Wt 255 lb (115.7 kg)   BMI  39.94 kg/m      Assessment & Plan:  1. Essential hypertension -Norvasc 5 mg started today --Daily blood pressure log given with instructions on how to fill out and told to bring to next visit -Dash diet information given -Exercise encouraged - Stress Management  -Continue current meds -RTO in 2 weeks - BMP8+EGFR - amLODipine (NORVASC) 5 MG tablet; Take 1 tablet (5 mg total) by mouth daily.  Dispense: 90 tablet; Refill: [place], FNP

## 2016-05-22 NOTE — Patient Instructions (Signed)
Hypertension Hypertension, commonly called high blood pressure, is when the force of blood pumping through your arteries is too strong. Your arteries are the blood vessels that carry blood from your heart throughout your body. A blood pressure reading consists of a higher number over a lower number, such as 110/72. The higher number (systolic) is the pressure inside your arteries when your heart pumps. The lower number (diastolic) is the pressure inside your arteries when your heart relaxes. Ideally you want your blood pressure below 120/80. Hypertension forces your heart to work harder to pump blood. Your arteries may become narrow or stiff. Having untreated or uncontrolled hypertension can cause heart attack, stroke, kidney disease, and other problems. What increases the risk? Some risk factors for high blood pressure are controllable. Others are not. Risk factors you cannot control include:  Race. You may be at higher risk if you are African American.  Age. Risk increases with age.  Gender. Men are at higher risk than women before age 45 years. After age 65, women are at higher risk than men. Risk factors you can control include:  Not getting enough exercise or physical activity.  Being overweight.  Getting too much fat, sugar, calories, or salt in your diet.  Drinking too much alcohol. What are the signs or symptoms? Hypertension does not usually cause signs or symptoms. Extremely high blood pressure (hypertensive crisis) may cause headache, anxiety, shortness of breath, and nosebleed. How is this diagnosed? To check if you have hypertension, your health care provider will measure your blood pressure while you are seated, with your arm held at the level of your heart. It should be measured at least twice using the same arm. Certain conditions can cause a difference in blood pressure between your right and left arms. A blood pressure reading that is higher than normal on one occasion does  not mean that you need treatment. If it is not clear whether you have high blood pressure, you may be asked to return on a different day to have your blood pressure checked again. Or, you may be asked to monitor your blood pressure at home for 1 or more weeks. How is this treated? Treating high blood pressure includes making lifestyle changes and possibly taking medicine. Living a healthy lifestyle can help lower high blood pressure. You may need to change some of your habits. Lifestyle changes may include:  Following the DASH diet. This diet is high in fruits, vegetables, and whole grains. It is low in salt, red meat, and added sugars.  Keep your sodium intake below 2,300 mg per day.  Getting at least 30-45 minutes of aerobic exercise at least 4 times per week.  Losing weight if necessary.  Not smoking.  Limiting alcoholic beverages.  Learning ways to reduce stress. Your health care provider may prescribe medicine if lifestyle changes are not enough to get your blood pressure under control, and if one of the following is true:  You are 18-59 years of age and your systolic blood pressure is above 140.  You are 60 years of age or older, and your systolic blood pressure is above 150.  Your diastolic blood pressure is above 90.  You have diabetes, and your systolic blood pressure is over 140 or your diastolic blood pressure is over 90.  You have kidney disease and your blood pressure is above 140/90.  You have heart disease and your blood pressure is above 140/90. Your personal target blood pressure may vary depending on your medical   conditions, your age, and other factors. Follow these instructions at home:  Have your blood pressure rechecked as directed by your health care provider.  Take medicines only as directed by your health care provider. Follow the directions carefully. Blood pressure medicines must be taken as prescribed. The medicine does not work as well when you skip  doses. Skipping doses also puts you at risk for problems.  Do not smoke.  Monitor your blood pressure at home as directed by your health care provider. Contact a health care provider if:  You think you are having a reaction to medicines taken.  You have recurrent headaches or feel dizzy.  You have swelling in your ankles.  You have trouble with your vision. Get help right away if:  You develop a severe headache or confusion.  You have unusual weakness, numbness, or feel faint.  You have severe chest or abdominal pain.  You vomit repeatedly.  You have trouble breathing. This information is not intended to replace advice given to you by your health care provider. Make sure you discuss any questions you have with your health care provider. Document Released: 06/25/2005 Document Revised: 12/01/2015 Document Reviewed: 04/17/2013 Elsevier Interactive Patient Education  2017 [employer]  

## 2016-05-23 LAB — BMP8+EGFR
BUN / CREAT RATIO: 14 (ref 9–23)
BUN: 14 mg/dL (ref 6–24)
CO2: 28 mmol/L (ref 18–29)
CREATININE: 1.01 mg/dL — AB (ref 0.57–1.00)
Calcium: 9.5 mg/dL (ref 8.7–10.2)
Chloride: 98 mmol/L (ref 96–106)
GFR, EST AFRICAN AMERICAN: 79 mL/min/{1.73_m2} (ref >59)
GFR, EST NON AFRICAN AMERICAN: 69 mL/min/{1.73_m2} (ref >59)
Glucose: 88 mg/dL (ref 65–99)
Potassium: 3.7 mmol/L (ref 3.5–5.2)
SODIUM: 142 mmol/L (ref 134–144)

## 2016-05-29 ENCOUNTER — Encounter: Payer: Self-pay | Admitting: Family

## 2016-06-05 ENCOUNTER — Encounter: Payer: Self-pay | Admitting: Family

## 2016-06-05 ENCOUNTER — Ambulatory Visit (INDEPENDENT_AMBULATORY_CARE_PROVIDER_SITE_OTHER): Payer: Medicaid Other | Admitting: Family

## 2016-06-05 VITALS — BP 151/102 | HR 96 | Temp 98.2°F | Ht 67.0 in | Wt 254.6 lb

## 2016-06-05 DIAGNOSIS — Z713 Dietary counseling and surveillance: Secondary | ICD-10-CM

## 2016-06-05 DIAGNOSIS — I10 Essential (primary) hypertension: Secondary | ICD-10-CM | POA: Diagnosis not present

## 2016-06-05 MED ORDER — AMLODIPINE BESYLATE 10 MG PO TABS: 10.0000 mg | ORAL_TABLET | Freq: Every day | ORAL | 3 refills | Status: DC

## 2016-06-05 MED ORDER — PHENTERMINE HCL 37.5 MG PO TBDP: 37.5000 mg | ORAL_TABLET | Freq: Every day | ORAL | 2 refills | Status: DC

## 2016-06-05 NOTE — Progress Notes (Signed)
   Subjective:    Patient ID: Carol Vargas, female    DOB: [birth year], 40 y.o.   MRN: [id number]  Pt presents to the office today to recheck HTN. PT's BP is not at goal today. PT requesting to start phentermine today. Pt states she has tried diet and exercise with minimum success. PT states she needs something to help control her hunger.  Hypertension  This is a chronic problem. The current episode started more than 1 year ago. The problem has been resolved since onset. The problem is controlled. Pertinent negatives include no headaches, malaise/fatigue, palpitations, peripheral edema or shortness of breath. Risk factors for coronary artery disease include obesity and sedentary lifestyle. Past treatments include angiotensin blockers, diuretics and calcium channel blockers. The current treatment provides moderate improvement. There is no history of kidney disease, CAD/MI, CVA or heart failure.      Review of Systems  Constitutional: Negative for malaise/fatigue.  Respiratory: Negative for shortness of breath.   Cardiovascular: Negative for palpitations.  Neurological: Negative for headaches.  All other systems reviewed and are negative.      Objective:   Physical Exam  Constitutional: She is oriented to person, place, and time. She appears well-developed and well-nourished. No distress.  obese  HENT:  Head: Normocephalic.  Eyes: Pupils are equal, round, and reactive to light.  Neck: Normal range of motion. Neck supple. No thyromegaly present.  Cardiovascular: Normal rate, regular rhythm, normal heart sounds and intact distal pulses.   No murmur heard. Pulmonary/Chest: Effort normal and breath sounds normal. No respiratory distress. She has no wheezes.  Abdominal: Soft. Bowel sounds are normal. She exhibits no distension. There is no tenderness.  Musculoskeletal: Normal range of motion. She exhibits no edema or tenderness.  Neurological: She is alert and oriented to person, place, and  time.  Skin: Skin is warm and dry.  Psychiatric: She has a normal mood and affect. Her behavior is normal. Judgment and thought content normal.  Vitals reviewed.     BP (!) 141/93   Pulse 97   Temp 98.2 F (36.8 C) (Oral)   Ht '5\' 7"'$  (1.702 m)   Wt 254 lb 9.6 oz (115.5 kg)   BMI 39.88 kg/m      Assessment & Plan:  1. Essential hypertension -Dash diet information given -Exercise encouraged - Stress Management  -Continue current meds -RTO in 3 months - CMP14+EGFR  2. Weight loss counseling, encounter for -Encourage healthy diet and exercise  -PT started on phentermine today RTO in 3 months, pt needs to have >5% weight loss - Phentermine HCl 37.5 MG TBDP; Take 37.5 mg by mouth daily.  Dispense: 30 tablet; Refill: 2 - CMP14+EGFR  3. Severe obesity (BMI >= 40) ([place])   Evelina Dun, FNP

## 2016-06-05 NOTE — Patient Instructions (Signed)
Exercising to Lose Weight Introduction Exercising can help you to lose weight. In order to lose weight through exercise, you need to do vigorous-intensity exercise. You can tell that you are exercising with vigorous intensity if you are breathing very hard and fast and cannot hold a conversation while exercising. Moderate-intensity exercise helps to maintain your current weight. You can tell that you are exercising at a moderate level if you have a higher heart rate and faster breathing, but you are still able to hold a conversation. How often should I exercise? Choose an activity that you enjoy and set realistic goals. Your health care provider can help you to make an activity plan that works for you. Exercise regularly as directed by your health care provider. This may include:  Doing resistance training twice each week, such as:  Push-ups.  Sit-ups.  Lifting weights.  Using resistance bands.  Doing a given intensity of exercise for a given amount of time. Choose from these options:  150 minutes of moderate-intensity exercise every week.  75 minutes of vigorous-intensity exercise every week.  A mix of moderate-intensity and vigorous-intensity exercise every week. Children, pregnant women, people who are out of shape, people who are overweight, and older adults may need to consult a health care provider for individual recommendations. If you have any sort of medical condition, be sure to consult your health care provider before starting a new exercise program. What are some activities that can help me to lose weight?  Walking at a rate of at least 4.5 miles an hour.  Jogging or running at a rate of 5 miles per hour.  Biking at a rate of at least 10 miles per hour.  Lap swimming.  Roller-skating or in-line skating.  Cross-country skiing.  Vigorous competitive sports, such as football, basketball, and soccer.  Jumping rope.  Aerobic dancing. How can I be more active in my  day-to-day activities?  Use the stairs instead of the elevator.  Take a walk during your lunch break.  If you drive, park your car farther away from work or school.  If you take public transportation, get off one stop early and walk the rest of the way.  Make all of your phone calls while standing up and walking around.  Get up, stretch, and walk around every 30 minutes throughout the day. What guidelines should I follow while exercising?  Do not exercise so much that you hurt yourself, feel dizzy, or get very short of breath.  Consult your health care provider prior to starting a new exercise program.  Wear comfortable clothes and shoes with good support.  Drink plenty of water while you exercise to prevent dehydration or heat stroke. Body water is lost during exercise and must be replaced.  Work out until you breathe faster and your heart beats faster. This information is not intended to replace advice given to you by your health care provider. Make sure you discuss any questions you have with your health care provider. Document Released: 07/28/2010 Document Revised: 12/01/2015 Document Reviewed: 11/26/2013  2017 Elsevier Calorie Counting for Weight Loss Calories are energy you get from the things you eat and drink. Your body uses this energy to keep you going throughout the day. The number of calories you eat affects your weight. When you eat more calories than your body needs, your body stores the extra calories as fat. When you eat fewer calories than your body needs, your body burns fat to get the energy it needs. Calorie  counting means keeping track of how many calories you eat and drink each day. If you make sure to eat fewer calories than your body needs, you should lose weight. In order for calorie counting to work, you will need to eat the number of calories that are right for you in a day to lose a healthy amount of weight per week. A healthy amount of weight to lose per  week is usually 1-2 lb (0.5-0.9 kg). A dietitian can determine how many calories you need in a day and give you suggestions on how to reach your calorie goal.  WHAT IS MY MY PLAN? My goal is to have __________ calories per day.  If I have this many calories per day, I should lose around __________ pounds per week. WHAT DO I NEED TO KNOW ABOUT CALORIE COUNTING? In order to meet your daily calorie goal, you will need to:  Find out how many calories are in each food you would like to eat. Try to do this before you eat.  Decide how much of the food you can eat.  Write down what you ate and how many calories it had. Doing this is called keeping a food log. WHERE DO I FIND CALORIE INFORMATION? The number of calories in a food can be found on a Nutrition Facts label. Note that all the information on a label is based on a specific serving of the food. If a food does not have a Nutrition Facts label, try to look up the calories online or ask your dietitian for help. HOW DO I DECIDE HOW MUCH TO EAT? To decide how much of the food you can eat, you will need to consider both the number of calories in one serving and the size of one serving. This information can be found on the Nutrition Facts label. If a food does not have a Nutrition Facts label, look up the information online or ask your dietitian for help. Remember that calories are listed per serving. If you choose to have more than one serving of a food, you will have to multiply the calories per serving by the amount of servings you plan to eat. For example, the label on a package of bread might say that a serving size is 1 slice and that there are 90 calories in a serving. If you eat 1 slice, you will have eaten 90 calories. If you eat 2 slices, you will have eaten 180 calories. HOW DO I KEEP A FOOD LOG? After each meal, record the following information in your food log:  What you ate.  How much of it you ate.  How many calories it had.  Then,  add up your calories. Keep your food log near you, such as in a small notebook in your pocket. Another option is to use a mobile app or website. Some programs will calculate calories for you and show you how many calories you have left each time you add an item to the log. WHAT ARE SOME CALORIE COUNTING TIPS?  Use your calories on foods and drinks that will fill you up and not leave you hungry. Some examples of this include foods like nuts and nut butters, vegetables, lean proteins, and high-fiber foods (more than 5 g fiber per serving).  Eat nutritious foods and avoid empty calories. Empty calories are calories you get from foods or beverages that do not have many nutrients, such as candy and soda. It is better to have a nutritious high-calorie food (  such as an avocado) than a food with few nutrients (such as a bag of chips).  Know how many calories are in the foods you eat most often. This way, you do not have to look up how many calories they have each time you eat them.  Look out for foods that may seem like low-calorie foods but are really high-calorie foods, such as baked goods, soda, and fat-free candy.  Pay attention to calories in drinks. Drinks such as sodas, specialty coffee drinks, alcohol, and juices have a lot of calories yet do not fill you up. Choose low-calorie drinks like water and diet drinks.  Focus your calorie counting efforts on higher calorie items. Logging the calories in a garden salad that contains only vegetables is less important than calculating the calories in a milk shake.  Find a way of tracking calories that works for you. Get creative. Most people who are successful find ways to keep track of how much they eat in a day, even if they do not count every calorie. WHAT ARE SOME PORTION CONTROL TIPS?  Know how many calories are in a serving. This will help you know how many servings of a certain food you can have.  Use a measuring cup to measure serving sizes. This  is helpful when you start out. With time, you will be able to estimate serving sizes for some foods.  Take some time to put servings of different foods on your favorite plates, bowls, and cups so you know what a serving looks like.  Try not to eat straight from a bag or box. Doing this can lead to overeating. Put the amount you would like to eat in a cup or on a plate to make sure you are eating the right portion.  Use smaller plates, glasses, and bowls to prevent overeating. This is a quick and easy way to practice portion control. If your plate is smaller, less food can fit on it.  Try not to multitask while eating, such as watching TV or using your computer. If it is time to eat, sit down at a table and enjoy your food. Doing this will help you to start recognizing when you are full. It will also make you more aware of what and how much you are eating. HOW CAN I CALORIE COUNT WHEN EATING OUT?  Ask for smaller portion sizes or child-sized portions.  Consider sharing an entree and sides instead of getting your own entree.  If you get your own entree, eat only half. Ask for a box at the beginning of your meal and put the rest of your entree in it so you are not tempted to eat it.  Look for the calories on the menu. If calories are listed, choose the lower calorie options.  Choose dishes that include vegetables, fruits, whole grains, low-fat dairy products, and lean protein. Focusing on smart food choices from each of the 5 food groups can help you stay on track at restaurants.  Choose items that are boiled, broiled, grilled, or steamed.  Choose water, milk, unsweetened iced tea, or other drinks without added sugars. If you want an alcoholic beverage, choose a lower calorie option. For example, a regular margarita can have up to 700 calories and a glass of wine has around 150.  Stay away from items that are buttered, battered, fried, or served with cream sauce. Items labeled "crispy" are  usually fried, unless stated otherwise.  Ask for dressings, sauces, and syrups on the  side. These are usually very high in calories, so do not eat much of them.  Watch out for salads. Many people think salads are a healthy option, but this is often not the case. Many salads come with bacon, fried chicken, lots of cheese, fried chips, and dressing. All of these items have a lot of calories. If you want a salad, choose a garden salad and ask for grilled meats or steak. Ask for the dressing on the side, or ask for olive oil and vinegar or lemon to use as dressing.  Estimate how many servings of a food you are given. For example, a serving of cooked rice is  cup or about the size of half a tennis ball or one cupcake wrapper. Knowing serving sizes will help you be aware of how much food you are eating at restaurants. The list below tells you how big or small some common portion sizes are based on everyday objects.  1 oz-4 stacked dice.  3 oz-1 deck of cards.  1 tsp-1 dice.  1 Tbsp- a Ping-Pong ball.  2 Tbsp-1 Ping-Pong ball.   cup-1 tennis ball or 1 cupcake wrapper.  1 cup-1 baseball. This information is not intended to replace advice given to you by your health care provider. Make sure you discuss any questions you have with your health care provider. Document Released: 06/25/2005 Document Revised: 07/16/2014 Document Reviewed: 04/30/2013 Elsevier Interactive Patient Education  2017 [employer].

## 2016-06-06 LAB — CMP14+EGFR
ALBUMIN: 4.2 g/dL (ref 3.5–5.5)
ALK PHOS: 74 IU/L (ref 39–117)
ALT: 22 IU/L (ref 0–32)
AST: 19 IU/L (ref 0–40)
Albumin/Globulin Ratio: 1.4 (ref 1.2–2.2)
BUN / CREAT RATIO: 14 (ref 9–23)
BUN: 13 mg/dL (ref 6–24)
Bilirubin Total: <0.2 mg/dL (ref 0.0–1.2)
CO2: 25 mmol/L (ref 18–29)
CREATININE: 0.92 mg/dL (ref 0.57–1.00)
Calcium: 9.3 mg/dL (ref 8.7–10.2)
Chloride: 97 mmol/L (ref 96–106)
GFR calc Af Amer: 89 mL/min/{1.73_m2} (ref >59)
GFR, EST NON AFRICAN AMERICAN: 77 mL/min/{1.73_m2} (ref >59)
GLOBULIN, TOTAL: 3.1 g/dL (ref 1.5–4.5)
Glucose: 97 mg/dL (ref 65–99)
Potassium: 3.5 mmol/L (ref 3.5–5.2)
Sodium: 141 mmol/L (ref 134–144)
TOTAL PROTEIN: 7.3 g/dL (ref 6.0–8.5)

## 2016-06-07 ENCOUNTER — Ambulatory Visit (INDEPENDENT_AMBULATORY_CARE_PROVIDER_SITE_OTHER): Payer: Medicaid Other | Admitting: Family Medicine

## 2016-06-07 ENCOUNTER — Encounter: Payer: Self-pay | Admitting: Family Medicine

## 2016-06-07 ENCOUNTER — Ambulatory Visit (INDEPENDENT_AMBULATORY_CARE_PROVIDER_SITE_OTHER): Payer: Medicaid Other

## 2016-06-07 VITALS — BP 134/89 | HR 80 | Temp 96.9°F | Ht 67.0 in

## 2016-06-07 DIAGNOSIS — M25532 Pain in left wrist: Secondary | ICD-10-CM | POA: Diagnosis not present

## 2016-06-07 DIAGNOSIS — G44209 Tension-type headache, unspecified, not intractable: Secondary | ICD-10-CM | POA: Diagnosis not present

## 2016-06-07 DIAGNOSIS — M25512 Pain in left shoulder: Secondary | ICD-10-CM | POA: Diagnosis not present

## 2016-06-07 IMAGING — DX DG WRIST 2V*L*
4 series · 4 of 4 positions shown · non-contrast
Comparison: No recent prior .

CLINICAL DATA: Injury.

EXAM:
LEFT WRIST - 2 VIEW

[wrist ap (1 of 3)]
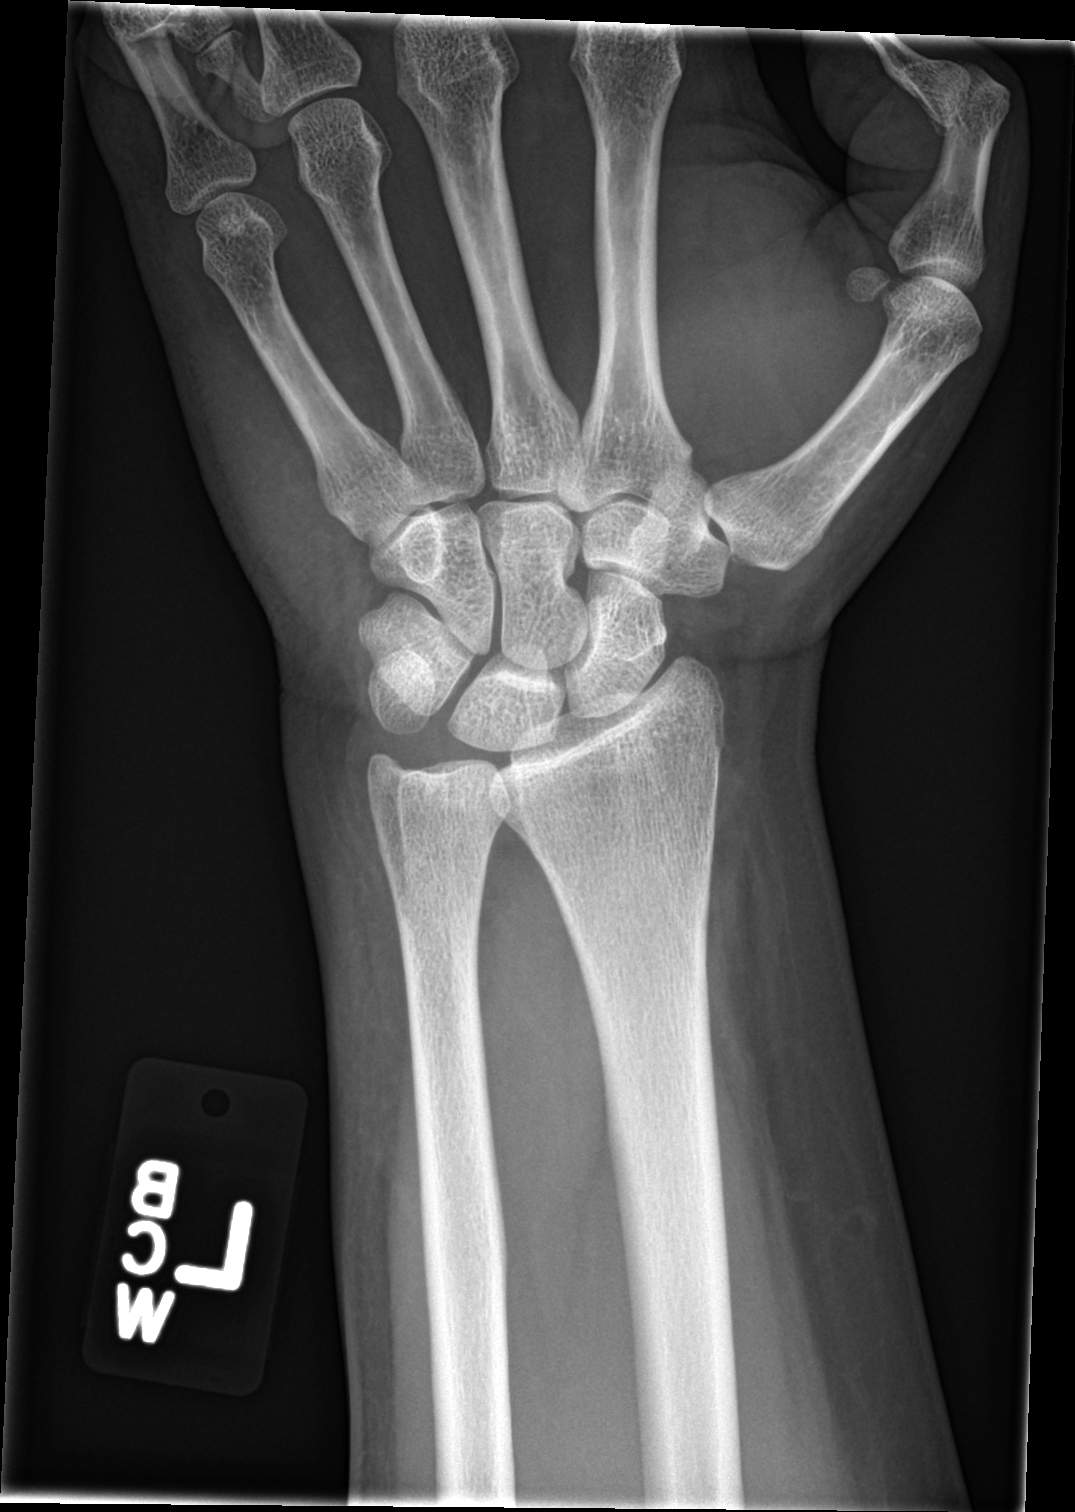

[wrist lat]
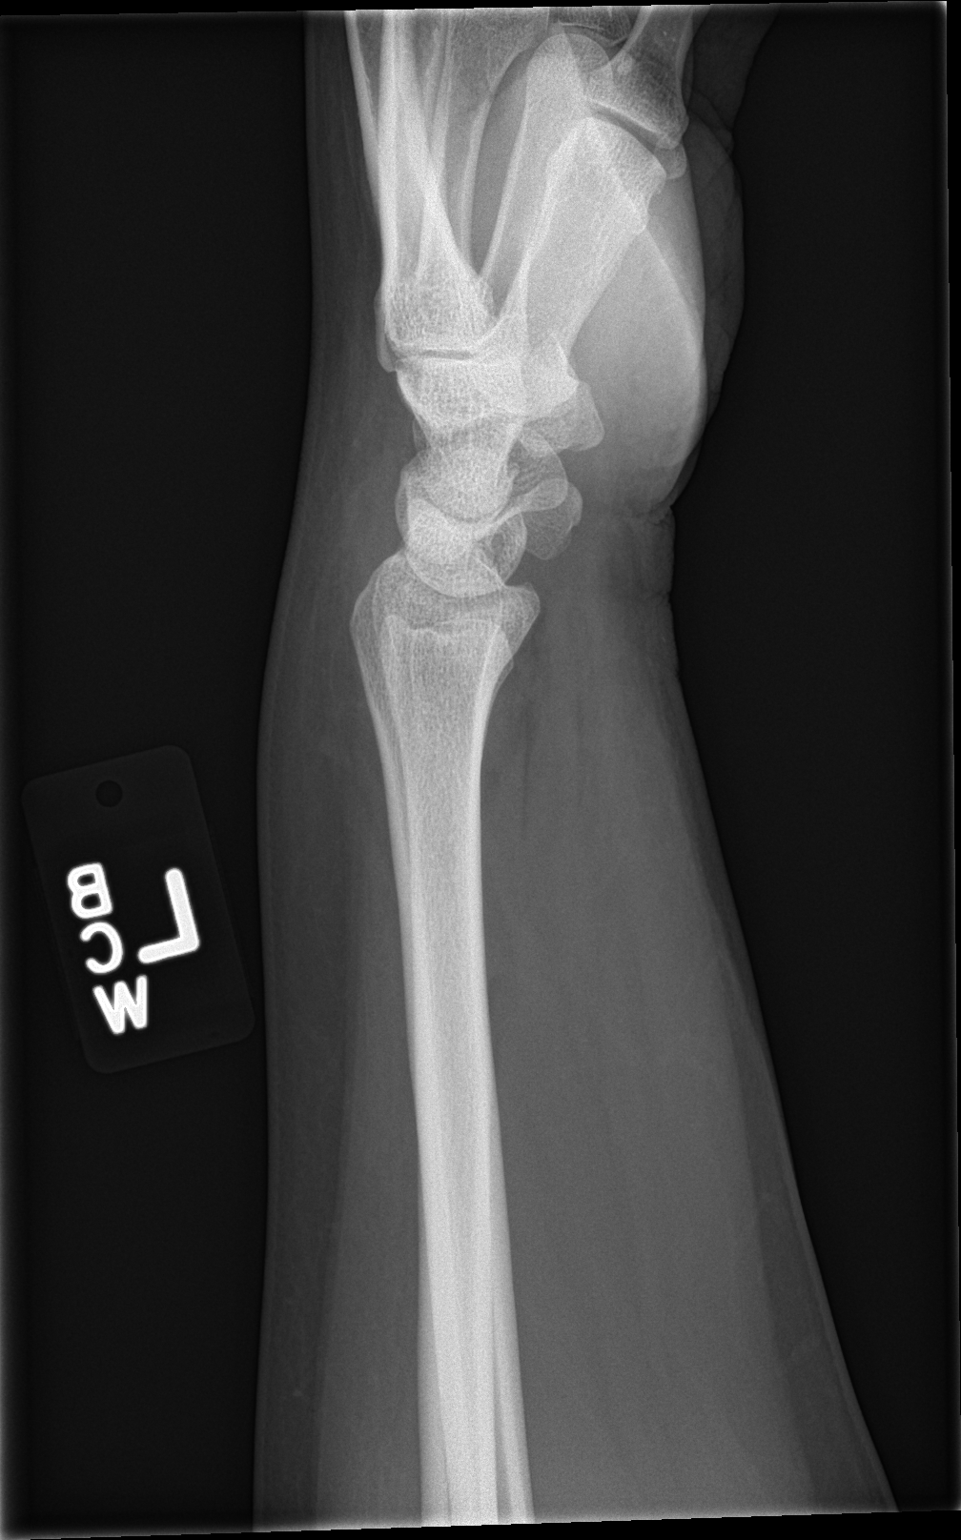

[wrist ap (2 of 3)]
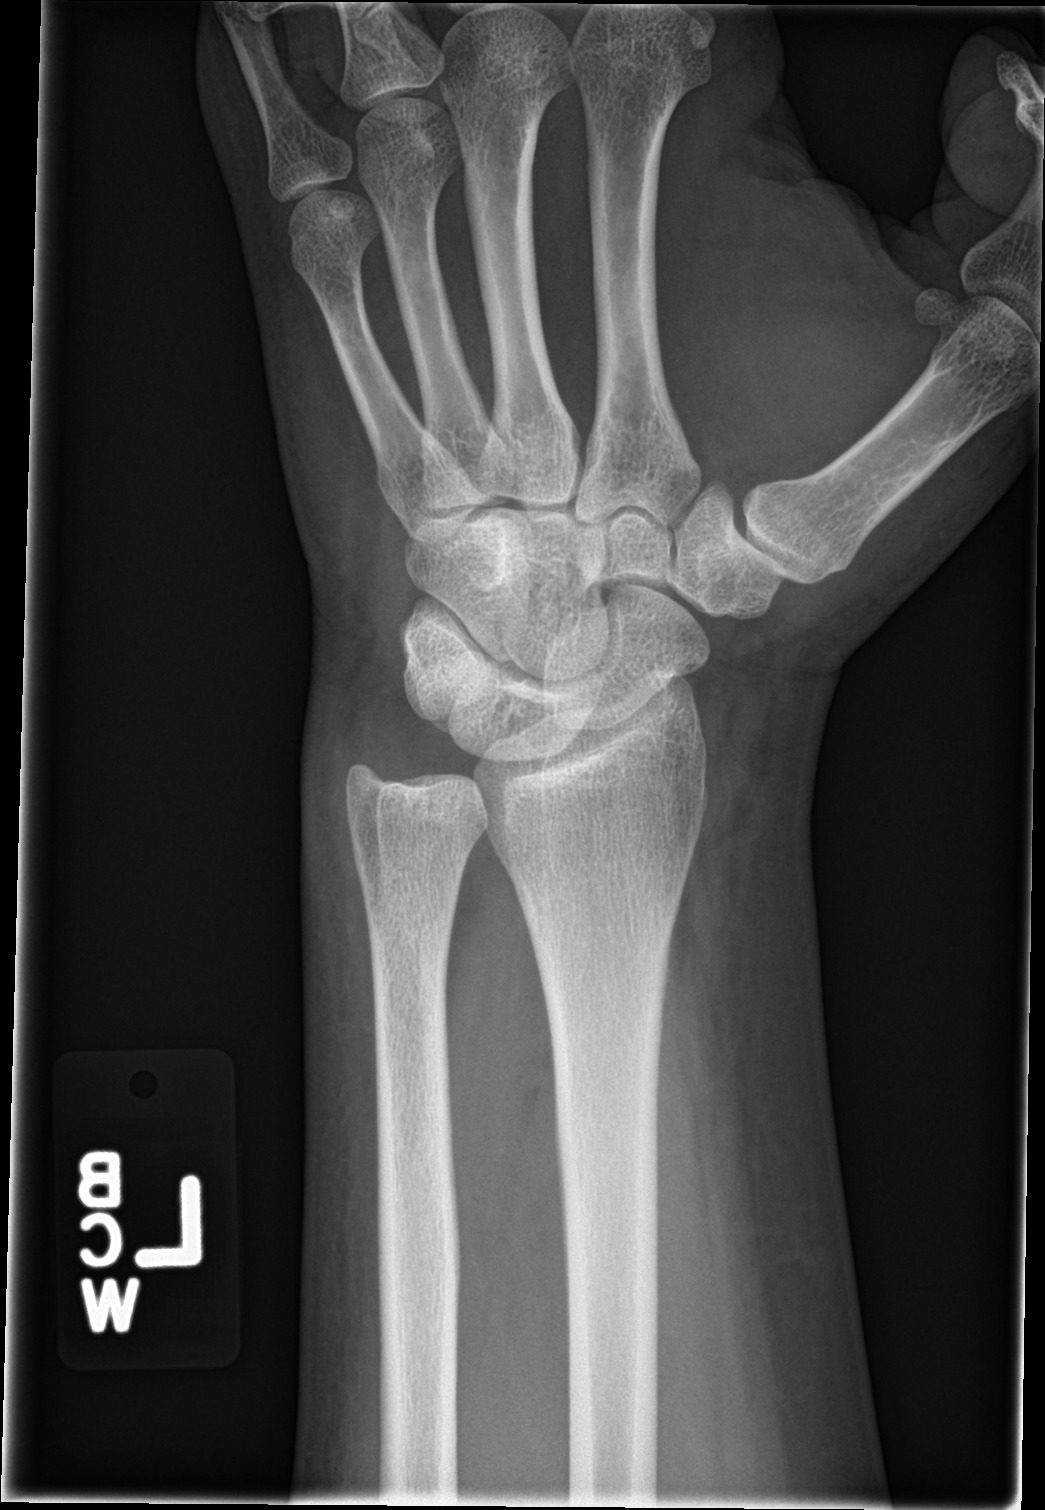

[wrist ap (3 of 3)]
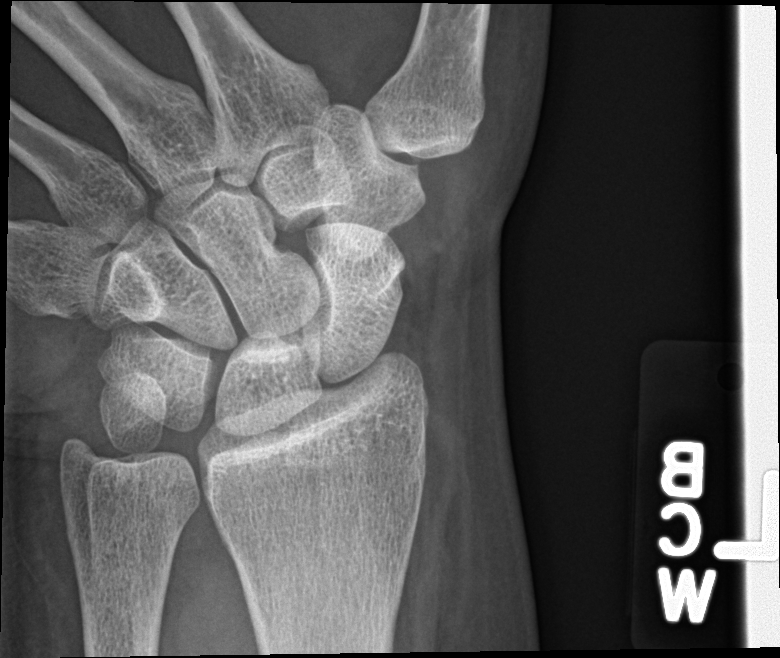

[4 of 4 positions shown; findings below may reference images not displayed]

FINDINGS: No acute bony or joint abnormality identified. No evidence of
fracture or dislocation.
IMPRESSION: No acute abnormality.

## 2016-06-07 MED ORDER — IBUPROFEN 800 MG PO TABS: 800.0000 mg | ORAL_TABLET | Freq: Three times a day (TID) | ORAL | 0 refills | Status: DC | PRN

## 2016-06-07 NOTE — Patient Instructions (Addendum)
Great to meet you!  Try naproxen for pain twice daily as needed  Come back in 2 weeks for re-check

## 2016-06-07 NOTE — Progress Notes (Signed)
   HPI  Patient presents today here with wrist pain.  Patient explains that this morning she got up very early, around 3 or 4 AM, to take her dog out. When she was going down steps she missed a step and fell forward landing on an outstretched left hand. Since that time she's had steadily worsening left wrist pain.  She's also had left shoulder pain, she describes this as mild and tolerable. She's had some improvement with Tylenol but states the wrist is concerning her.  She has pain with supination, pronation, flexion of the left wrist.   PMH: Smoking status noted ROS: Per HPI  Objective: BP 134/89   Pulse 80   Temp (!) 96.9 F (36.1 C) (Oral)   Ht 5\' 7"  (1.702 m)   LMP 05/22/2016  Gen: NAD, alert, cooperative with exam HEENT: NCAT CV: RRR, good S1/S2, no murmur Resp: CTABL, no wheezes, non-labored Ext: No edema, warm Neuro: Alert and oriented, No gross deficits  MSK tenderness to palpation of the distal lipoma, limited range of motion with flexion, and extension due to pain No gross deformity, very mild swelling No tenderness in the anatomic snuffbox  PLain film - no acute Fx  Assessment and plan:  # Wrist pain Some concern for occult fracture, after fall on outstretched arm this am.  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 Return to clinic in 2 weeks for repeat x-ray if symptoms have not completely resolved.  # Shoulder pain Mild rotator cuff syndrome most likely Discussed ice, gentle range of motion, NSAIDs      Orders Placed This Encounter  Procedures  . DG Wrist 2 Views Left    Standing Status:   Future    Number of Occurrences:   1    Standing Expiration Date:   08/07/2017    Order Specific Question:   Reason for Exam (SYMPTOM  OR DIAGNOSIS REQUIRED)    Answer:   wrist pain    Order Specific Question:   Is patient pregnant?    Answer:   No    Order Specific Question:    Preferred imaging location?    Answer:   Internal    No orders of the defined types were placed in this encounter.   Laroy Apple, MD [facility] 06/07/2016, 2:51 PM

## 2016-06-22 ENCOUNTER — Ambulatory Visit: Payer: Self-pay | Admitting: Family Medicine

## 2016-07-24 ENCOUNTER — Telehealth: Payer: Self-pay | Admitting: Family

## 2016-07-24 NOTE — Telephone Encounter (Signed)
Carol Vargas is this true or is this related to her deductible?

## 2016-07-24 NOTE — Telephone Encounter (Signed)
Spoke with pt regarding BP meds Medicaid will no longer cover Losartan Will you change to something else?

## 2016-07-27 NOTE — Telephone Encounter (Signed)
Will work on prior [employer]

## 2016-07-27 NOTE — Telephone Encounter (Signed)
Has this been addressed yet? Please advise

## 2016-07-31 ENCOUNTER — Telehealth: Payer: Self-pay

## 2016-07-31 MED ORDER — LOSARTAN POTASSIUM 100 MG PO TABS: 100.0000 mg | ORAL_TABLET | Freq: Every day | ORAL | 3 refills | Status: DC

## 2016-07-31 MED ORDER — HYDROCHLOROTHIAZIDE 25 MG PO TABS: 25.0000 mg | ORAL_TABLET | Freq: Every day | ORAL | 3 refills | Status: DC

## 2016-07-31 NOTE — Telephone Encounter (Signed)
Left  Message about separating medication to see if insurance will cover.

## 2016-07-31 NOTE — Telephone Encounter (Signed)
I sent in Losartan and HCTZ as individual prescription.

## 2016-08-01 NOTE — Telephone Encounter (Signed)
Aware, scripts are at pharmacy.

## 2016-09-27 ENCOUNTER — Ambulatory Visit (INDEPENDENT_AMBULATORY_CARE_PROVIDER_SITE_OTHER): Payer: Medicaid Other | Admitting: Family

## 2016-09-27 ENCOUNTER — Encounter: Payer: Self-pay | Admitting: Family

## 2016-09-27 VITALS — BP 138/94 | HR 83 | Temp 97.4°F | Ht 67.0 in | Wt 246.8 lb

## 2016-09-27 DIAGNOSIS — N920 Excessive and frequent menstruation with regular cycle: Secondary | ICD-10-CM

## 2016-09-27 DIAGNOSIS — E559 Vitamin D deficiency, unspecified: Secondary | ICD-10-CM | POA: Diagnosis not present

## 2016-09-27 DIAGNOSIS — I10 Essential (primary) hypertension: Secondary | ICD-10-CM | POA: Diagnosis not present

## 2016-09-27 DIAGNOSIS — R5383 Other fatigue: Secondary | ICD-10-CM

## 2016-09-27 DIAGNOSIS — E782 Mixed hyperlipidemia: Secondary | ICD-10-CM | POA: Diagnosis not present

## 2016-09-27 DIAGNOSIS — E8881 Metabolic syndrome: Secondary | ICD-10-CM | POA: Diagnosis not present

## 2016-09-27 NOTE — Patient Instructions (Signed)

## 2016-09-27 NOTE — Progress Notes (Signed)
Subjective:    Patient ID: Carol Vargas, female    DOB: [birth year], 41 y.o.   MRN: [id number]  PT presents to the office today for chronic follow up . Pt is complaining of heavy menses for 5 days during her period. Pt reports having to wear two pads at time and using 20 pads a day. PT taking Sprintec that was helping but over the last few months her bleeding is worse.  Hypertension  This is a chronic problem. The current episode started more than 1 year ago. The problem has been waxing and waning since onset. The problem is uncontrolled. Associated symptoms include malaise/fatigue. Pertinent negatives include no headaches, palpitations, peripheral edema or shortness of breath. Risk factors for coronary artery disease include dyslipidemia, obesity and sedentary lifestyle. Past treatments include diuretics, angiotensin blockers and calcium channel blockers. The current treatment provides mild improvement. There is no history of kidney disease, CAD/MI, CVA or heart failure.  Hyperlipidemia  This is a chronic problem. The current episode started more than 1 year ago. The problem is controlled. Recent lipid tests were reviewed and are normal. Exacerbating diseases include obesity. Pertinent negatives include no shortness of breath. Current antihyperlipidemic treatment includes statins. The current treatment provides moderate improvement of lipids. Risk factors for coronary artery disease include dyslipidemia, hypertension and obesity.  Metabolic Syndrome PT is trying to eat healthier and trying to become more active.     Review of Systems  Constitutional: Positive for malaise/fatigue.  Respiratory: Negative for shortness of breath.   Cardiovascular: Negative for palpitations.  Genitourinary: Positive for menstrual problem and vaginal bleeding.  Neurological: Negative for headaches.  All other systems reviewed and are negative.      Objective:   Physical Exam  Constitutional: She is oriented  to person, place, and time. She appears well-developed and well-nourished. No distress.  Morbid obese  HENT:  Head: Normocephalic.  Right Ear: External ear normal.  Left Ear: External ear normal.  Nose: Nose normal.  Mouth/Throat: Oropharynx is clear and moist.  Eyes: Pupils are equal, round, and reactive to light.  Neck: Normal range of motion. Neck supple. No thyromegaly present.  Cardiovascular: Normal rate, regular rhythm, normal heart sounds and intact distal pulses.   No murmur heard. Pulmonary/Chest: Effort normal and breath sounds normal. No respiratory distress. She has no wheezes.  Abdominal: Soft. Bowel sounds are normal. She exhibits no distension. There is no tenderness.  Musculoskeletal: Normal range of motion. She exhibits no edema or tenderness.  Neurological: She is alert and oriented to person, place, and time.  Skin: Skin is warm and dry.  Psychiatric: She has a normal mood and affect. Her behavior is normal. Judgment and thought content normal.  Vitals reviewed.     BP (!) 138/94   Pulse 83   Temp 97.4 F (36.3 C) (Oral)   Ht '5\' 7"'$  (1.702 m)   Wt 246 lb 12.8 oz (111.9 kg)   BMI 38.65 kg/m      Assessment & Plan:  1. Essential hypertension - CMP14+EGFR  2. Vitamin D deficiency - CMP14+EGFR - VITAMIN D 25 Hydroxy (Vit-D Deficiency, Fractures)  3. Severe obesity (BMI >= 40) (HCC) - FGH82+XHBZ  4. Metabolic syndrome - JIR67+ELFY - Lipid panel  5. Mixed hyperlipidemia - CMP14+EGFR - Lipid panel  6. Menorrhagia with regular cycle - CMP14+EGFR - Anemia Profile B - Ambulatory referral to Gynecology  7. Other fatigue - CMP14+EGFR - Anemia Profile B - Thyroid Panel With TSH   Continue all  meds Labs pending Health Maintenance reviewed Diet and exercise encouraged RTO 4 months   Evelina Dun, FNP

## 2016-09-28 LAB — CMP14+EGFR
A/G RATIO: 1.1 — AB (ref 1.2–2.2)
ALT: 15 IU/L (ref 0–32)
AST: 16 IU/L (ref 0–40)
Albumin: 3.9 g/dL (ref 3.5–5.5)
Alkaline Phosphatase: 78 IU/L (ref 39–117)
BUN/Creatinine Ratio: 13 (ref 9–23)
BUN: 11 mg/dL (ref 6–24)
CALCIUM: 9.4 mg/dL (ref 8.7–10.2)
CHLORIDE: 98 mmol/L (ref 96–106)
CO2: 28 mmol/L (ref 18–29)
Creatinine, Ser: 0.87 mg/dL (ref 0.57–1.00)
GFR calc Af Amer: 95 mL/min/{1.73_m2} (ref >59)
GFR, EST NON AFRICAN AMERICAN: 82 mL/min/{1.73_m2} (ref >59)
GLOBULIN, TOTAL: 3.4 g/dL (ref 1.5–4.5)
Glucose: 77 mg/dL (ref 65–99)
POTASSIUM: 3.5 mmol/L (ref 3.5–5.2)
SODIUM: 141 mmol/L (ref 134–144)
Total Protein: 7.3 g/dL (ref 6.0–8.5)

## 2016-09-28 LAB — ANEMIA PROFILE B
BASOS: 0 %
Basophils Absolute: 0 10*3/uL (ref 0.0–0.2)
EOS (ABSOLUTE): 0.2 10*3/uL (ref 0.0–0.4)
Eos: 2 %
FERRITIN: 24 ng/mL (ref 15–150)
FOLATE: 10.3 ng/mL (ref >3.0)
HEMATOCRIT: 42.2 % (ref 34.0–46.6)
Hemoglobin: 13.9 g/dL (ref 11.1–15.9)
IRON SATURATION: 22 % (ref 15–55)
IRON: 81 ug/dL (ref 27–159)
Immature Grans (Abs): 0 10*3/uL (ref 0.0–0.1)
Immature Granulocytes: 0 %
LYMPHS ABS: 1.8 10*3/uL (ref 0.7–3.1)
Lymphs: 27 %
MCH: 31 pg (ref 26.6–33.0)
MCHC: 32.9 g/dL (ref 31.5–35.7)
MCV: 94 fL (ref 79–97)
MONOCYTES: 6 %
Monocytes Absolute: 0.4 10*3/uL (ref 0.1–0.9)
NEUTROS ABS: 4.4 10*3/uL (ref 1.4–7.0)
Neutrophils: 65 %
PLATELETS: 244 10*3/uL (ref 150–379)
RBC: 4.48 x10E6/uL (ref 3.77–5.28)
RDW: 13.3 % (ref 12.3–15.4)
RETIC CT PCT: 1.4 % (ref 0.6–2.6)
TIBC: 367 ug/dL (ref 250–450)
UIBC: 286 ug/dL (ref 131–425)
VITAMIN B 12: 752 pg/mL (ref 232–1245)
WBC: 6.8 10*3/uL (ref 3.4–10.8)

## 2016-09-28 LAB — LIPID PANEL
CHOL/HDL RATIO: 3.3 ratio (ref 0.0–4.4)
Cholesterol, Total: 160 mg/dL (ref 100–199)
HDL: 49 mg/dL (ref >39)
LDL Calculated: 84 mg/dL (ref 0–99)
TRIGLYCERIDES: 134 mg/dL (ref 0–149)
VLDL Cholesterol Cal: 27 mg/dL (ref 5–40)

## 2016-09-28 LAB — THYROID PANEL WITH TSH
FREE THYROXINE INDEX: 2.4 (ref 1.2–4.9)
T3 Uptake Ratio: 19 % — ABNORMAL LOW (ref 24–39)
T4, Total: 12.7 ug/dL — ABNORMAL HIGH (ref 4.5–12.0)
TSH: 0.563 u[IU]/mL (ref 0.450–4.500)

## 2016-09-28 LAB — VITAMIN D 25 HYDROXY (VIT D DEFICIENCY, FRACTURES): VIT D 25 HYDROXY: 54.4 ng/mL (ref 30.0–100.0)

## 2016-10-04 ENCOUNTER — Other Ambulatory Visit: Payer: Self-pay | Admitting: *Deleted

## 2016-10-04 DIAGNOSIS — Z713 Dietary counseling and surveillance: Secondary | ICD-10-CM

## 2016-10-04 MED ORDER — PHENTERMINE HCL 37.5 MG PO TBDP: 37.5000 mg | ORAL_TABLET | Freq: Every day | ORAL | 2 refills | Status: DC

## 2016-10-04 NOTE — Telephone Encounter (Signed)
Refill called in to [facility]

## 2016-10-04 NOTE — Telephone Encounter (Signed)
Please send RX in and pt needs follow up in 3 months

## 2016-10-04 NOTE — Telephone Encounter (Signed)
Pt requesting rx - was here 3/22 and states was not taking.  C. Hawks - please address

## 2016-10-04 NOTE — Telephone Encounter (Signed)
Pt aware needs to be seen in 3 months

## 2016-10-09 ENCOUNTER — Encounter (INDEPENDENT_AMBULATORY_CARE_PROVIDER_SITE_OTHER): Payer: Self-pay

## 2016-10-09 ENCOUNTER — Other Ambulatory Visit (INDEPENDENT_AMBULATORY_CARE_PROVIDER_SITE_OTHER): Payer: Medicaid Other

## 2016-10-09 ENCOUNTER — Other Ambulatory Visit: Payer: Self-pay | Admitting: Women's Health

## 2016-10-09 ENCOUNTER — Ambulatory Visit (INDEPENDENT_AMBULATORY_CARE_PROVIDER_SITE_OTHER): Payer: Medicaid Other | Admitting: Women's Health

## 2016-10-09 ENCOUNTER — Encounter: Payer: Self-pay | Admitting: Women's Health

## 2016-10-09 VITALS — BP 128/80 | HR 68 | Ht 68.0 in | Wt 245.0 lb

## 2016-10-09 DIAGNOSIS — N926 Irregular menstruation, unspecified: Secondary | ICD-10-CM | POA: Diagnosis not present

## 2016-10-09 DIAGNOSIS — N921 Excessive and frequent menstruation with irregular cycle: Secondary | ICD-10-CM

## 2016-10-09 DIAGNOSIS — D259 Leiomyoma of uterus, unspecified: Secondary | ICD-10-CM | POA: Insufficient documentation

## 2016-10-09 NOTE — Progress Notes (Addendum)
[facility] Visit  Patient name: Carol Vargas MRN [id number]  Date of birth: June 26, 1976  CC & HPI:  Carol Vargas is a 41 y.o.  African American female presenting today for report of heavy irregular periods. Periods became heavy last year- was put on sprintec by her pcp which did help to lighten periods and she would only bleed during placebo week. However, beginning in Jan of this year she bled heavily x 2wks, then bled the entire month of Feb, then again March 17-31. Was just wearing pads at first, but had to change q 1hr, so started using tampons as well as was bleeding through tampon onto pad. Has soiled clothes. 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 Minimal cramping. Some clots. Hot flashes began in March, during day and night. Denies mood swings or vaginal dryness. Is not currently sexually active. Has had Essure done 73yrs ago. Both her mom and her sister had hysterectomies around her age for menorrhagia. She also feels dizzy and fatigued during periods of heavy bleeding, so she has begun taking Fe supplementation and Geritol.  Patient's last menstrual period was 09/20/2016. The current method of family planning is abstinence and Essure 47yrs ago. Last pap June 2017 w/ PCP, normal, no h/o abnormal  Pertinent History Reviewed:  Medical & Surgical Hx:   Past medical, surgical, family, and social history reviewed in electronic medical record Medications: Reviewed & Updated - see associated section Allergies: Reviewed in electronic medical record  Objective Findings:  Vitals: BP 128/80 (BP Location: Right Arm, Patient Position: Sitting, Cuff Size: Large)   Pulse 68   Ht 5\' 8"  (1.727 m)   Wt 245 lb (111.1 kg)   LMP 09/20/2016   BMI 37.25 kg/m  Body mass index is 37.25 kg/m.  Physical Examination: General appearance - alert, well appearing, and in no distress Pelvic - normal external genitalia, cx parious w/o evident polyps/lesions,  normal discharge w/o odor, no bleeding currently Bimanual: mild CMT, uterus/adnexae w/o massess/tenderness  No results found for this or any previous visit (from the past 24 hour(s)).   Assessment & Plan:  A:   Menorrhagia w/ irregular cycle  Hot flashes  P:  GC/CT and CBC today  Return today @ 1pm for pelvic u/s, I will discuss results w/ her after   Tawnya Crook CNM, WHNP-BC 10/09/2016 8:59 AM    1345: Pt returned for u/s today @ 1pm. Discussed results- few small fibroids.   Today's Pelvic u/s: Carol Vargas is a 41 y.o.LMP 09/22/2016 for a pelvic sonogram for menorrhagia. Uterus                      9.9 x 5.8 x 7.2 cm,  heterogeneous anteverted uterus w/mult.fibroids Endometrium          9.9 mm, symmetrical, wnl Right ovary             2 x 1.4 x 2.2 cm, wnl  (limited view)  Left ovary                2.8 x 1.8 x 2.4 cm, wnl  (limited view) Fibroids:                 (#1) post submucosal fibroid 2.7 x 1.5 x 2.5 cm,(#2) ant subserosal fibroid 2 x 1.6 x 1.9 cm  Technician Comments: PELVIC US TA/TV: heterogeneous anteverted uterus w/mult.fibroids,(#1) post submucosal fibroid 2.7 x 1.5  x 2.5 cm,(#2) ant subserosal fibroid 2 x 1.6 x 1.9 cm, EEC 9.9 mm,normal ovaries bilat(limited view),no pain during ultrasound,no free fluid [employer] 10/09/2016 1:49 PM  Discussed option of switching to different form of contraception to see if it would help w/ bleeding, specifically an IUD, vs. Discussing potential for hysterectomy w/ MD. Pt prefers this option d/t her family hx of same, and wants to 'nip it in the bud'. Appt 4/12 w/ JVF to discuss if she is a candidate for surgical intervention.  Roma Schanz, CNM, WHNP-BC 10/09/2016 2:26 PM

## 2016-10-09 NOTE — Progress Notes (Signed)
PELVIC US TA/TV: heterogeneous anteverted uterus w/mult.fibroids,(#1) post submucosal fibroid 2.7 x 1.5 x 2.5 cm,(#2) ant subserosal fibroid 2 x 1.6 x 1.9 cm, EEC 9.9 mm,normal ovaries bilat(limited view),no pain during ultrasound,no free fluid

## 2016-10-11 LAB — GC/CHLAMYDIA PROBE AMP
CHLAMYDIA, DNA PROBE: NEGATIVE
Neisseria gonorrhoeae by PCR: NEGATIVE

## 2016-10-18 ENCOUNTER — Ambulatory Visit (INDEPENDENT_AMBULATORY_CARE_PROVIDER_SITE_OTHER): Payer: Medicaid Other | Admitting: Obstetrics and Gynecology

## 2016-10-18 ENCOUNTER — Encounter: Payer: Self-pay | Admitting: Obstetrics and Gynecology

## 2016-10-18 VITALS — BP 138/88 | HR 84 | Ht 68.0 in | Wt 243.2 lb

## 2016-10-18 DIAGNOSIS — N921 Excessive and frequent menstruation with irregular cycle: Secondary | ICD-10-CM

## 2016-10-18 NOTE — Progress Notes (Signed)
[facility] Visit  @DATE @            Patient name: Carol Vargas MRN [id number]  Date of birth: 1976-05-03  CC & HPI:  Carol Vargas is a 41 y.o. female presenting today to discuss surgery to control heavy and now irregular periods. Per last visit note, she stated this started about 1 year ago. Most recent period was March 17-31. February she had a period for the first 2 weeks and had light bleeding for the remainder of the month. She was started on Sprintec by PCP in August 2017 and states it worked for about 6 months but has gradually decreased effectiveness of period control this year. She reports starting iron supplements. She has [place] on 10/10/16 which showed fibroids.   GYNECOLOGIC SONOGRAM   Carol Vargas is a 41 y.o.LMP 09/22/2016 for a pelvic sonogram for menorrhagia.  Uterus                      9.9 x 5.8 x 7.2 cm,  heterogeneous anteverted uterus w/mult.fibroids  Endometrium          9.9 mm, symmetrical, wnl  Right ovary             2 x 1.4 x 2.2 cm, wnl  (limited view)   Left ovary                2.8 x 1.8 x 2.4 cm, wnl  (limited view)  Fibroids:                 (#1) post submucosal fibroid 2.7 x 1.5 x 2.5 cm,(#2) ant subserosal fibroid 2 x 1.6 x 1.9 cm    Technician Comments:  PELVIC US TA/TV: heterogeneous anteverted uterus w/mult.fibroids,(#1) post submucosal fibroid 2.7 x 1.5 x 2.5 cm,(#2) ant subserosal fibroid 2 x 1.6 x 1.9 cm, EEC 9.9 mm,normal ovaries bilat(limited view),no pain during ultrasound,no free fluid    [employer] 10/09/2016 1:49 PM  Clinical Impression and recommendations:  I have reviewed the sonogram results above, combined with the patient's current clinical course, below are my impressions and any appropriate recommendations for management based on the sonographic findings.  Uterine volume and size is normal, there are 2 small myomas present one of which is submucosal and slightly distorting the endometrium which may be  contributing to her bleeding issues.  I think she is still appropriate for endometrial ablation if that is a possible option Endometrium normal Both ovaries are normal   EURE,LUTHER H 10/14/2016 11:01 AM  ROS:  ROS +heavy and irregular periods +minimal abdominal cramping  -fever  Pertinent History Reviewed:   Reviewed: Significant for c section Medical         Past Medical History:  Diagnosis Date  . Arthritis   . Fibroids   . Headache(784.0)    at times, nothing severe per pt.menstral  . Hypertension                               Surgical Hx:    Past Surgical History:  Procedure Laterality Date  . CESAREAN SECTION    . DILATION AND CURETTAGE OF UTERUS    . EXTERNAL FIXATION LEG Right 07/05/2013   Procedure: EXTERNAL FIXATION LEG;  Surgeon: Marianna Payment, MD;  Location: [place];  Service: Orthopedics;  Laterality: Right;  . EXTERNAL FIXATION LEG Right 07/07/2013   Procedure: Ex-Fix Revision Right  Brennan Bailey;  Surgeon: Rozanna Box, MD;  Location: [place];  Service: Orthopedics;  Laterality: Right;  . EXTERNAL FIXATION REMOVAL Right 07/28/2013   Procedure: REMOVAL EXTERNAL FIXATION LEG;  Surgeon: Rozanna Box, MD;  Location: [place];  Service: Orthopedics;  Laterality: Right;  . HARDWARE REMOVAL Right 08/02/2014   Procedure: HARDWARE REMOVAL;  Surgeon: Vickey Huger, MD;  Location: [place];  Service: Orthopedics;  Laterality: Right;  . KNEE ARTHROSCOPY Right 08/02/2014   Procedure: ARTHROSCOPY KNEE;  Surgeon: Vickey Huger, MD;  Location: [place];  Service: Orthopedics;  Laterality: Right;  . ORIF TIBIA PLATEAU Right 07/28/2013   Procedure: OPEN REDUCTION INTERNAL FIXATION (ORIF) RIGHT TIBIAL PLATEAU;  Surgeon: Rozanna Box, MD;  Location: [place];  Service: Orthopedics;  Laterality: Right;  . TONSILLECTOMY  1992  . TOTAL KNEE ARTHROPLASTY Right 01/03/2015   Procedure: TOTAL KNEE ARTHROPLASTY;  Surgeon: Vickey Huger, MD;  Location: [place];  Service: Orthopedics;  Laterality:  Right;  former tibial plateau fracture orif with hardware removed 07/2014   Medications: Reviewed & Updated - see associated section                       Current Outpatient Prescriptions:  .  amLODipine (NORVASC) 10 MG tablet, Take 1 tablet (10 mg total) by mouth daily., Disp: 90 tablet, Rfl: 3 .  atorvastatin (LIPITOR) 20 MG tablet, Take 1 tablet (20 mg total) by mouth daily., Disp: 90 tablet, Rfl: 0 .  IRON PO, Take by mouth daily., Disp: , Rfl:  .  losartan-hydrochlorothiazide (HYZAAR) 100-25 MG tablet, Take 1 tablet by mouth daily., Disp: , Rfl: 1 .  norgestimate-ethinyl estradiol (SPRINTEC 28) 0.25-35 MG-MCG tablet, Take 1 tablet by mouth daily., Disp: 1 Package, Rfl: 11 .  Phentermine HCl 37.5 MG TBDP, Take 37.5 mg by mouth daily. (Patient taking differently: Take 37.5 mg by mouth daily. Takes half a tab once a day), Disp: 30 tablet, Rfl: 2 .  potassium chloride (K-DUR) 10 MEQ tablet, Take 1 tablet (10 mEq total) by mouth 2 (two) times daily., Disp: 60 tablet, Rfl: 3 No current facility-administered medications for this visit.   Facility-Administered Medications Ordered in Other Visits:  .  acetaminophen (TYLENOL) tablet 1,000 mg, 1,000 mg, Oral, Once, Ainsley Spinner, PA-C   Social History: Reviewed -  reports that she quit smoking about 3 years ago. Her smoking use included Cigarettes. She has a 2.50 pack-year smoking history. She has never used smokeless tobacco.  Objective Findings:  Vitals: Blood pressure 138/88, pulse 84, height 5\' 8"  (1.727 m), weight 243 lb 3.2 oz (110.3 kg), last menstrual period 09/20/2016.  Physical Examination: General appearance - alert, well appearing, and in no distress Mental status - alert, oriented to person, place, and time Pelvic - examination not indicated  The provider spent over 25 minutes with the visit , including previsit review, and documentation,with > than 50% spent in counseling and coordination of care. Specific discussion of period details  and deciding between endometrial ablation vs hysterectomy.  The patient is not interested in a procedure that has a risk of failure. Potential success rate of (my estimate) of 80% with Endometrial abaltion is unacceptable failure rate to the pt opinion. She does not wish to consider ablation further.   Assessment & Plan:   A:  1. AUB and BTB on birth control pills 2. Uterine fibroids   P:  1. Not a good candidate for endometrial ablation.  Will proceed with supracervical  hysterectomy  2. Follow up within 1 week for endometrial biopsy      By signing my name below, I, Sonum Patel, attest that this documentation has been prepared under the direction and in the presence of Jonnie Kind, MD. Electronically Signed: Sonum Patel, Education administrator. 10/18/16. 3:56 PM.  I personally performed the services described in this documentation, which was SCRIBED in my presence. The recorded information has been reviewed and considered accurate. It has been edited as necessary during review. Jonnie Kind, MD

## 2016-10-19 ENCOUNTER — Encounter: Payer: Self-pay | Admitting: Obstetrics and Gynecology

## 2016-10-19 ENCOUNTER — Other Ambulatory Visit: Payer: Self-pay | Admitting: Obstetrics and Gynecology

## 2016-10-19 ENCOUNTER — Ambulatory Visit (INDEPENDENT_AMBULATORY_CARE_PROVIDER_SITE_OTHER): Payer: Medicaid Other | Admitting: Obstetrics and Gynecology

## 2016-10-19 VITALS — BP 120/92 | HR 84 | Wt 242.8 lb

## 2016-10-19 DIAGNOSIS — D252 Subserosal leiomyoma of uterus: Secondary | ICD-10-CM | POA: Diagnosis not present

## 2016-10-19 DIAGNOSIS — Z3202 Encounter for pregnancy test, result negative: Secondary | ICD-10-CM | POA: Diagnosis not present

## 2016-10-19 DIAGNOSIS — D250 Submucous leiomyoma of uterus: Secondary | ICD-10-CM | POA: Diagnosis not present

## 2016-10-19 LAB — POCT URINE PREGNANCY: Preg Test, Ur: NEGATIVE

## 2016-10-19 NOTE — Progress Notes (Signed)
Patient ID: Carol Vargas, female   DOB: Jun 14, 1976, 41 y.o.   MRN: [id number]  Endometrial Biopsy: Patient given informed consent, signed copy in the chart, time out was performed. Time out taken. The patient was placed in the lithotomy position and the cervix brought into view with sterile speculum.  Portio of cervix cleansed x 2 with betadine swabs.  A tenaculum was placed in the anterior lip of the cervix. The uterus was sounded for depth of 10 cm. Milex uterine Explora 3 mm was introduced to into the uterus, suction created,  and an endometrial sample was obtained. All equipment was removed and accounted for.   The patient tolerated the procedure well.   Patient given post procedure instructions.  Followup: by phone for results     By signing my name below, I, Hansel Feinstein, attest that this documentation has been prepared under the direction and in the presence of Jonnie Kind, MD. Electronically Signed: Hansel Feinstein, ED Scribe. 10/19/16. 10:28 AM.  I personally performed the services described in this documentation, which was SCRIBED in my presence. The recorded information has been reviewed and considered accurate. It has been edited as necessary during review. Jonnie Kind, MD

## 2016-10-24 ENCOUNTER — Telehealth: Payer: Self-pay | Admitting: *Deleted

## 2016-10-24 NOTE — Telephone Encounter (Signed)
Informed patient of benign biopsy. Patient unsure when to f/u for pre-op for hysterectomy. Advised to speak with Angie regarding this matter. Call transferred to [place].

## 2016-10-31 ENCOUNTER — Telehealth: Payer: Self-pay | Admitting: *Deleted

## 2016-10-31 ENCOUNTER — Other Ambulatory Visit: Payer: Self-pay | Admitting: *Deleted

## 2016-11-01 MED ORDER — NORGESTIMATE-ETH ESTRADIOL 0.25-35 MG-MCG PO TABS: 1.0000 | ORAL_TABLET | Freq: Every day | ORAL | 4 refills | Status: DC

## 2016-11-01 MED ORDER — ALUMINUM CHLORIDE 20 % EX SOLN: Freq: Every day | CUTANEOUS | 0 refills | Status: DC

## 2016-11-01 NOTE — Telephone Encounter (Signed)
Patient aware.

## 2016-11-01 NOTE — Telephone Encounter (Signed)
Refill Prescription sent to pharmacy   

## 2016-11-09 ENCOUNTER — Ambulatory Visit (INDEPENDENT_AMBULATORY_CARE_PROVIDER_SITE_OTHER): Payer: Medicaid Other | Admitting: Obstetrics and Gynecology

## 2016-11-09 ENCOUNTER — Encounter: Payer: Self-pay | Admitting: Obstetrics and Gynecology

## 2016-11-09 VITALS — BP 120/90 | HR 78 | Wt 240.8 lb

## 2016-11-09 DIAGNOSIS — D250 Submucous leiomyoma of uterus: Secondary | ICD-10-CM | POA: Diagnosis not present

## 2016-11-09 DIAGNOSIS — N921 Excessive and frequent menstruation with irregular cycle: Secondary | ICD-10-CM | POA: Diagnosis not present

## 2016-11-09 DIAGNOSIS — D252 Subserosal leiomyoma of uterus: Secondary | ICD-10-CM | POA: Diagnosis not present

## 2016-11-09 NOTE — Progress Notes (Signed)
[place] [facility] Visit  @DATE @            Patient name: Carol Vargas MRN [id number]  Date of birth: [birth year]  CC & HPI:  Kingsley Herandez is a 41 y.o. female presenting today for pre-op visit. 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 July 2018  ROS:  ROS None at this time  Pertinent History Reviewed:   Reviewed: Significant for  Medical         Past Medical History:  Diagnosis Date   Arthritis    Fibroids    Headache(784.0)    at times, nothing severe per pt.menstral   Hypertension                               Surgical Hx:    Past Surgical History:  Procedure Laterality Date   CESAREAN SECTION     DILATION AND CURETTAGE OF UTERUS     EXTERNAL FIXATION LEG Right 07/05/2013   Procedure: EXTERNAL FIXATION LEG;  Surgeon: Marianna Payment, MD;  Location: [place];  Service: Orthopedics;  Laterality: Right;   EXTERNAL FIXATION LEG Right 07/07/2013   Procedure: Ex-Fix Revision Right Tibial Plateau;  Surgeon: Rozanna Box, MD;  Location: [place];  Service: Orthopedics;  Laterality: Right;   EXTERNAL FIXATION REMOVAL Right 07/28/2013   Procedure: REMOVAL EXTERNAL FIXATION LEG;  Surgeon: Rozanna Box, MD;  Location: [place];  Service: Orthopedics;  Laterality: Right;   HARDWARE REMOVAL Right 08/02/2014   Procedure: HARDWARE REMOVAL;  Surgeon: Vickey Huger, MD;  Location: [place];  Service: Orthopedics;  Laterality: Right;   KNEE ARTHROSCOPY Right 08/02/2014   Procedure: ARTHROSCOPY KNEE;  Surgeon: Vickey Huger, MD;  Location: [place];  Service: Orthopedics;  Laterality: Right;   ORIF TIBIA PLATEAU Right 07/28/2013   Procedure: OPEN REDUCTION INTERNAL FIXATION (ORIF) RIGHT TIBIAL PLATEAU;  Surgeon: Rozanna Box, MD;  Location: [place];  Service: Orthopedics;  Laterality: Right;   TONSILLECTOMY  1992   TOTAL KNEE ARTHROPLASTY Right 01/03/2015   Procedure: TOTAL KNEE  ARTHROPLASTY;  Surgeon: Vickey Huger, MD;  Location: [place];  Service: Orthopedics;  Laterality: Right;  former tibial plateau fracture orif with hardware removed 07/2014   Medications: Reviewed & Updated - see associated section                       Current Outpatient Prescriptions:    aluminum chloride (DRYSOL) 20 % external solution, Apply topically at bedtime., Disp: 35 mL, Rfl: 0   amLODipine (NORVASC) 10 MG tablet, Take 1 tablet (10 mg total) by mouth daily., Disp: 90 tablet, Rfl: 3   atorvastatin (LIPITOR) 20 MG tablet, Take 1 tablet (20 mg total) by mouth daily., Disp: 90 tablet, Rfl: 0   IRON PO, Take by mouth daily., Disp: , Rfl:    losartan-hydrochlorothiazide (HYZAAR) 100-25 MG tablet, Take 1 tablet by mouth daily., Disp: , Rfl: 1   norgestimate-ethinyl estradiol (PREVIFEM) 0.25-35 MG-MCG tablet, Take 1 tablet by mouth daily., Disp: 3 Package, Rfl: 4   Phentermine HCl 37.5 MG TBDP, Take 37.5 mg by mouth daily. (Patient taking differently: Take 37.5 mg by mouth daily. Takes half a tab once a day), Disp: 30 tablet, Rfl: 2   potassium chloride (K-DUR) 10 MEQ tablet, Take 1 tablet (10 mEq total) by mouth 2 (two) times  daily., Disp: 60 tablet, Rfl: 3 No current facility-administered medications for this visit.   Facility-Administered Medications Ordered in Other Visits:    acetaminophen (TYLENOL) tablet 1,000 mg, 1,000 mg, Oral, Once, Ainsley Spinner, PA-C   Social History: Reviewed -  reports that she quit smoking about 3 years ago. Her smoking use included Cigarettes. She has a 2.50 pack-year smoking history. She has never used smokeless tobacco.  Objective Findings:  Vitals: Blood pressure 120/90, pulse 78, weight 240 lb 12.8 oz (109.2 kg).  Physical Examination: General appearance - alert, well appearing, and in no distress Mental status - alert, oriented to person, place, and time   Assessment & Plan:   A:  1. After discussion patient has decided to postpone surgery    P:  1. Supracervical hysterectomy postponed until July 2018

## 2016-11-19 ENCOUNTER — Telehealth: Payer: Self-pay | Admitting: Obstetrics and Gynecology

## 2016-11-19 NOTE — Telephone Encounter (Signed)
Patient called stating she is still bleeding. Should would like something to help with that since she is not able to have her surgery until after graduation in June. Please advise.

## 2016-11-22 ENCOUNTER — Other Ambulatory Visit: Payer: Self-pay | Admitting: Obstetrics and Gynecology

## 2016-11-22 MED ORDER — MEGESTROL ACETATE 40 MG PO TABS: 40.0000 mg | ORAL_TABLET | Freq: Three times a day (TID) | ORAL | 2 refills | Status: DC

## 2016-11-22 NOTE — Telephone Encounter (Signed)
Will rx Megace til surgery

## 2016-11-22 NOTE — Telephone Encounter (Signed)
Informed patient prescription for Megace sent to pharmacy. Instructed on how to take medication. Verbalized understanding.

## 2017-01-11 ENCOUNTER — Other Ambulatory Visit: Payer: Self-pay | Admitting: *Deleted

## 2017-01-11 ENCOUNTER — Encounter: Payer: Self-pay | Admitting: Obstetrics and Gynecology

## 2017-01-11 ENCOUNTER — Ambulatory Visit (INDEPENDENT_AMBULATORY_CARE_PROVIDER_SITE_OTHER): Payer: Medicaid Other | Admitting: Obstetrics and Gynecology

## 2017-01-11 DIAGNOSIS — D250 Submucous leiomyoma of uterus: Secondary | ICD-10-CM | POA: Diagnosis not present

## 2017-01-11 DIAGNOSIS — N920 Excessive and frequent menstruation with regular cycle: Secondary | ICD-10-CM

## 2017-01-11 DIAGNOSIS — Z01818 Encounter for other preprocedural examination: Secondary | ICD-10-CM | POA: Diagnosis not present

## 2017-01-11 DIAGNOSIS — D252 Subserosal leiomyoma of uterus: Secondary | ICD-10-CM

## 2017-01-11 MED ORDER — LOSARTAN POTASSIUM-HCTZ 100-25 MG PO TABS: 1.0000 | ORAL_TABLET | Freq: Every day | ORAL | 1 refills | Status: DC
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
## 2017-01-13 LAB — GC/CHLAMYDIA PROBE AMP
CHLAMYDIA, DNA PROBE: NEGATIVE
Neisseria gonorrhoeae by PCR: NEGATIVE

## 2017-01-14 ENCOUNTER — Telehealth: Payer: Self-pay | Admitting: *Deleted

## 2017-01-14 NOTE — Telephone Encounter (Signed)
LMOVM that GC/CHL labs were negative. Advised pt to call us back if she has any questions.

## 2017-01-23 NOTE — Pre-Procedure Instructions (Signed)
Text sent advising Dr Glo Herring that PAT orders are needed for tomorrow

## 2017-01-24 ENCOUNTER — Other Ambulatory Visit: Payer: Self-pay | Admitting: Obstetrics and Gynecology

## 2017-01-24 ENCOUNTER — Encounter (HOSPITAL_COMMUNITY)
Admission: RE | Admit: 2017-01-24 | Discharge: 2017-01-24 | Disposition: A | Discharge status: Home / Self Care | Payer: Medicaid Other | Source: Ambulatory Visit | Attending: Obstetrics and Gynecology | Admitting: Obstetrics and Gynecology

## 2017-01-24 ENCOUNTER — Other Ambulatory Visit: Payer: Self-pay

## 2017-01-24 ENCOUNTER — Encounter (HOSPITAL_COMMUNITY): Payer: Self-pay

## 2017-01-24 DIAGNOSIS — Z01812 Encounter for preprocedural laboratory examination: Secondary | ICD-10-CM | POA: Insufficient documentation

## 2017-01-24 LAB — SURGICAL PCR SCREEN
MRSA, PCR: NEGATIVE
STAPHYLOCOCCUS AUREUS: NEGATIVE

## 2017-01-24 LAB — CBC
HEMATOCRIT: 40.7 % (ref 36.0–46.0)
Hemoglobin: 13.9 g/dL (ref 12.0–15.0)
MCH: 31 pg (ref 26.0–34.0)
MCHC: 34.2 g/dL (ref 30.0–36.0)
MCV: 90.6 fL (ref 78.0–100.0)
Platelets: 224 10*3/uL (ref 150–400)
RBC: 4.49 MIL/uL (ref 3.87–5.11)
RDW: 13 % (ref 11.5–15.5)
WBC: 8.3 10*3/uL (ref 4.0–10.5)

## 2017-01-24 LAB — URINALYSIS, ROUTINE W REFLEX MICROSCOPIC
BILIRUBIN URINE: NEGATIVE
Glucose, UA: NEGATIVE mg/dL
Ketones, ur: NEGATIVE mg/dL
Nitrite: NEGATIVE
PH: 5 (ref 5.0–8.0)
Protein, ur: 30 mg/dL — AB
RBC / HPF: NONE SEEN RBC/hpf (ref 0–5)
SPECIFIC GRAVITY, URINE: 1.032 — AB (ref 1.005–1.030)

## 2017-01-24 LAB — COMPREHENSIVE METABOLIC PANEL
ALT: 29 U/L (ref 14–54)
ANION GAP: 9 (ref 5–15)
AST: 26 U/L (ref 15–41)
Albumin: 3.9 g/dL (ref 3.5–5.0)
Alkaline Phosphatase: 65 U/L (ref 38–126)
BILIRUBIN TOTAL: 0.4 mg/dL (ref 0.3–1.2)
BUN: 16 mg/dL (ref 6–20)
CHLORIDE: 106 mmol/L (ref 101–111)
CO2: 27 mmol/L (ref 22–32)
Calcium: 9.2 mg/dL (ref 8.9–10.3)
Creatinine, Ser: 0.96 mg/dL (ref 0.44–1.00)
Glucose, Bld: 108 mg/dL — ABNORMAL HIGH (ref 65–99)
POTASSIUM: 3 mmol/L — AB (ref 3.5–5.1)
Sodium: 142 mmol/L (ref 135–145)
TOTAL PROTEIN: 7.6 g/dL (ref 6.5–8.1)

## 2017-01-24 LAB — HCG, SERUM, QUALITATIVE: PREG SERUM: NEGATIVE

## 2017-01-24 NOTE — Patient Instructions (Signed)
Carol Vargas  01/24/2017     @PREFPERIOPPHARMACY @   Your procedure is scheduled on  01/29/2017   Report to [facility] at  840  A.M.  Call this number if you have problems the morning of surgery:  [phone]   Remember:  Do not eat food or drink liquids after midnight.  Take these medicines the morning of surgery with A SIP OF WATER  Norvasc, losartan.   Do not wear jewelry, make-up or nail polish.  Do not wear lotions, powders, or perfumes, or deoderant.  Do not shave 48 hours prior to surgery.  Men may shave face and neck.  Do not bring valuables to the hospital.  [facility] is not responsible for any belongings or valuables.  Contacts, dentures or bridgework may not be worn into surgery.  Leave your suitcase in the car.  After surgery it may be brought to your room.  For patients admitted to the hospital, discharge time will be determined by your treatment team.  Patients discharged the day of surgery will not be allowed to drive home.   Name and phone number of your driver:   family Special instructions:  Follow the enclosed diet and prep instructions.  Please read over the following fact sheets that you were given. Pain Booklet, Coughing and Deep Breathing, Blood Transfusion Information, MRSA Information, Surgical Site Infection Prevention, Anesthesia Post-op Instructions and Care and Recovery After Surgery      Supracervical Hysterectomy A supracervical hysterectomy is surgery to remove the top part of the uterus, but not the cervix. You will no longer have menstrual periods or be able to get pregnant after this surgery. The fallopian tubes and ovaries may also be removed (bilateral salpingo-oophorectomy) during this surgery. This surgery is usually performed using a minimally invasive technique called laparoscopy. This technique allows the surgery to be done through small incisions. The minimally invasive technique provides benefits such as less  pain, less risk of infection, and shorter recovery time. Tell a health care provider about:  Any allergies you have.  All medicines you are taking, including vitamins, herbs, eye drops, creams, and over-the-counter medicines.  Any problems you or family members have had with anesthetic medicines.  Any blood disorders you have.  Any surgeries you have had.  Any medical conditions you have. What are the risks? Generally, this is a safe procedure. However, as with any procedure, complications can occur. Possible complications include:  Bleeding.  Blood clots in the legs or lung.  Infection.  Injury to surrounding organs.  Problems related to anesthesia.  Conversion to an open abdominal surgery.  Additional surgery later to remove the cervix if you have problems with the cervix.  What happens before the procedure?  Ask your health care provider about changing or stopping your regular medicines.  Do not take aspirin or blood thinners (anticoagulants) for 1 week before the surgery, or as directed by your health care provider.  Do not eat or drink anything for 8 hours before the surgery, or as directed by your health care provider.  Quit smoking if you smoke.  Arrange for a ride home after surgery and for someone to help you at home during recovery. What happens during the procedure?  You will be given an antibiotic medicine.  An IV tube will be placed in one of your veins. You will be given medicine to make you sleep (general anesthetic).  A gas (carbon dioxide)  will be used to inflate your abdomen. This will allow your surgeon to look inside your abdomen, perform your surgery, and treat any other problems found if necessary.  Three or four small incisions will be made in your abdomen. One of these incisions will be made in the area of your belly button (navel). A thin, flexible tube with a tiny camera and light on the end of it (laparoscope) will be inserted into the  incision. The camera on the laparoscope sends a picture to a TV screen in the operating room. This gives your surgeon a good view inside the abdomen.  Other surgical instruments will be inserted through the other incisions.  The uterus will be cut into small pieces and removed through the small incisions.  Your incisions will be closed. What happens after the procedure?  You will be taken to a recovery area where your progress will be monitored until you are awake, stable, and taking fluids well. If there are no other problems, you will then be moved to a regular hospital room, or you will be allowed to go home.  You will likely have minimal discomfort after the surgery because the incisions are so small with the laparoscopic technique.  You will be given pain medicine while you are in the hospital and for when you go home.  If a bilateral salpingo-oophorectomy was performed before menopause, you will go through a sudden (abrupt) menopause. This can be helped with hormone medicines. This information is not intended to replace advice given to you by your health care provider. Make sure you discuss any questions you have with your health care provider. Document Released: 12/12/2007 Document Revised: 12/01/2015 Document Reviewed: 12/26/2012 Elsevier Interactive Patient Education  2018 [place] Hysterectomy, Care After Refer to this sheet in the next few weeks. These instructions provide you with information on caring for yourself after your procedure. Your health care provider may also give you more specific instructions. Your treatment has been planned according to current medical practices, but problems sometimes occur. Call your health care provider if you have any problems or questions after your procedure. What can I expect after the procedure? After your procedure, it is typical to have some discomfort, tenderness, swelling, and bruising at the surgical sites. This  normally lasts for about 2 weeks. Follow these instructions at home:  Get plenty of rest and sleep.  Only take over-the-counter or prescription medicines as directed by your health care provider.  Do not take aspirin. It can cause bleeding.  Do not drive until your health care provider approves.  Follow your health care provider's advice regarding exercise, lifting, and general activities.  Resume your usual diet as directed by your health care provider.  Do not douche, use tampons, or have sexual intercourse for at least 6 weeks or until your health care provider gives you permission.  Change your bandages (dressings) only as directed by your health care provider.  Monitor your temperature.  Take showers instead of baths for 2-3 weeks or as directed by your health care provider.  Drink enough fluids to keep your urine clear or pale yellow.  Do not drink alcohol until your health care provider gives you permission.  If you are constipated, you may take a mild laxative if your health care provider approves. Bran foods may also help with constipation problems.  Try to have someone home with you for 1-2 weeks to help with activities.  Follow up with your health care provider as directed.  Contact a health care provider if:  You have swelling, redness, or increasing pain in the incision area.  You have pus coming from an incision.  You notice a bad smell coming from the incision or dressing.  You have swelling, redness, or pain in the area around the IV site.  Your incision breaks open.  You feel dizzy or lightheaded.  You have pain or bleeding when you urinate.  You have persistent diarrhea.  You have persistent nausea and vomiting.  You have abnormal vaginal discharge.  You have a rash.  Your pain is not controlled with your prescribed medicine. Get help right away if:  You have a fever.  You have severe abdominal pain.  You have chest pain.  You have  shortness of breath.  You faint.  You have pain, swelling, or redness in your leg.  You have heavy vaginal bleeding with blood clots. This information is not intended to replace advice given to you by your health care provider. Make sure you discuss any questions you have with your health care provider. Document Released: 04/15/2013 Document Revised: 12/01/2015 Document Reviewed: 12/26/2012 Elsevier Interactive Patient Education  2018 [place], also called tubectomy, is the surgical removal of one of the fallopian tubes. The fallopian tubes are where eggs travel from the ovaries to the uterus. Removing one fallopian tube does not prevent you from becoming pregnant. It also does not cause problems with your menstrual periods. You may need a salpingectomy if you:  Have a fertilized egg that attaches to the fallopian tube (ectopic pregnancy), especially one that causes the tube to burst or tear (rupture).  Have an infected fallopian tube.  Have cancer of the fallopian tube or nearby organs.  Have had an ovary removed due to a cyst or tumor.  Have had your uterus removed.  There are three different methods that can be used for a salpingectomy:  Open. This method involves making one large incision in your abdomen.  Laparoscopic. This method involves using a thin, lighted tube with a tiny camera on the end (laparoscope) to help perform the procedure. The laparoscope will allow your surgeon to make several small incisions in the abdomen instead of a large incision.  Robot-assisted: This method involves using a computer to control surgical instruments that are attached to robotic arms.  Tell a health care provider about:  Any allergies you have.  All medicines you are taking, including vitamins, herbs, eye drops, creams, and over-the-counter medicines.  Any problems you or family members have had with anesthetic medicines.  Any blood disorders you  have.  Any surgeries you have had.  Any medical conditions you have.  Whether you are pregnant or may be pregnant. What are the risks? Generally, this is a safe procedure. However, problems may occur, including:  Infection.  Bleeding.  Allergic reactions to medicines.  Damage to other structures or organs.  Blood clots in the legs or lungs.  What happens before the procedure? Staying hydrated Follow instructions from your health care provider about hydration, which may include:  Up to 2 hours before the procedure - you may continue to drink clear liquids, such as water, clear fruit juice, black coffee, and plain tea.  Eating and drinking restrictions Follow instructions from your health care provider about eating and drinking, which may include:  8 hours before the procedure - stop eating heavy meals or foods such as meat, fried foods, or fatty foods.  6 hours before the procedure -  stop eating light meals or foods, such as toast or cereal.  6 hours before the procedure - stop drinking milk or drinks that contain milk.  2 hours before the procedure - stop drinking clear liquids.  Medicines  Ask your health care provider about: ? Changing or stopping your regular medicines. This is especially important if you are taking diabetes medicines or blood thinners. ? Taking medicines such as aspirin and ibuprofen. These medicines can thin your blood. Do not take these medicines before your procedure if your health care provider instructs you not to.  You may be given antibiotic medicine to help prevent infection. General instructions  Do not smoke for at least 2 weeks before your procedure. If you need help quitting, ask your health care provider.  You may have an exam or tests, such as an electrocardiogram (ECG).  You may have a blood or urine sample taken.  Ask your health care provider: ? Whether you should stop removing hair from your surgical area. ? How your  surgical site will be marked or identified.  You may be asked to shower with a germ-killing soap.  Plan to have someone take you home from the hospital or clinic.  If you will be going home right after the procedure, plan to have someone with you for 24 hours. What happens during the procedure?  To reduce your risk of infection: ? Your health care team will wash or sanitize their hands. ? Hair may be removed from the surgical area. ? Your skin will be washed with soap.  An IV tube will be inserted into one of your veins.  You will be given a medicine to make you fall asleep (general anesthetic). You may also be given a medicine to help you relax (sedative).  A thin tube (catheter) may be inserted through your urethra and into your bladder to drain urine during your procedure.  Depending on the type of procedure you are having, one incision or several small incisions will be made in your abdomen.  Your fallopian tube will be cut and removed from where it attaches to your uterus.  Your blood vessels will be clamped and tied to prevent excess bleeding.  The incision(s) in your abdomen will be closed with stitches (sutures), staples, or skin glue.  A bandage (dressing) may be placed over your incision(s). The procedure may vary among health care providers and hospitals. What happens after the procedure?  Your blood pressure, heart rate, breathing rate, and blood oxygen level will be monitored until the medicines you were given have worn off.  You may continue to receive fluids and medicines through an IV tube.  You may continue to have a catheter draining your urine.  You may have to wear compression stockings. These stockings help to prevent blood clots and reduce swelling in your legs.  You will be given pain medicine as needed.  Do not drive for 24 hours if you received a sedative. Summary  Salpingectomy is a surgical procedure to remove one of the fallopian tubes.  The  procedure may be done with an open incision, with a laparoscope, or with computer-controlled instruments.  Depending on the type of procedure you are having, one incision or several small incisions will be made in your abdomen.  Your blood pressure, heart rate, breathing rate, and blood oxygen level will be monitored until the medicines you were given have worn off.  Plan to have someone take you home from the hospital or clinic. This  information is not intended to replace advice given to you by your health care provider. Make sure you discuss any questions you have with your health care provider. Document Released: 11/11/2008 Document Revised: 02/10/2016 Document Reviewed: 12/17/2012 Elsevier Interactive Patient Education  2018 [place] Anesthesia, Adult General anesthesia is the use of medicines to make a person "go to sleep" (be unconscious) for a medical procedure. General anesthesia is often recommended when a procedure:  Is long.  Requires you to be still or in an unusual position.  Is major and can cause you to lose blood.  Is impossible to do without general anesthesia.  The medicines used for general anesthesia are called general anesthetics. In addition to making you sleep, the medicines:  Prevent pain.  Control your blood pressure.  Relax your muscles.  Tell a health care provider about:  Any allergies you have.  All medicines you are taking, including vitamins, herbs, eye drops, creams, and over-the-counter medicines.  Any problems you or family members have had with anesthetic medicines.  Types of anesthetics you have had in the past.  Any bleeding disorders you have.  Any surgeries you have had.  Any medical conditions you have.  Any history of heart or lung conditions, such as heart failure, sleep apnea, or chronic obstructive pulmonary disease (COPD).  Whether you are pregnant or may be pregnant.  Whether you use tobacco, alcohol,  marijuana, or street drugs.  Any history of Armed forces logistics/support/administrative officer.  Any history of depression or anxiety. What are the risks? Generally, this is a safe procedure. However, problems may occur, including:  Allergic reaction to anesthetics.  Lung and heart problems.  Inhaling food or liquids from your stomach into your lungs (aspiration).  Injury to nerves.  Waking up during your procedure and being unable to move (rare).  Extreme agitation or a state of mental confusion (delirium) when you wake up from the anesthetic.  Air in the bloodstream, which can lead to stroke.  These problems are more likely to develop if you are having a major surgery or if you have an advanced medical condition. You can prevent some of these complications by answering all of your health care provider's questions thoroughly and by following all pre-procedure instructions. General anesthesia can cause side effects, including:  Nausea or vomiting  A sore throat from the breathing tube.  Feeling cold or shivery.  Feeling tired, washed out, or achy.  Sleepiness or drowsiness.  Confusion or agitation.  What happens before the procedure? Staying hydrated Follow instructions from your health care provider about hydration, which may include:  Up to 2 hours before the procedure - you may continue to drink clear liquids, such as water, clear fruit juice, black coffee, and plain tea.  Eating and drinking restrictions Follow instructions from your health care provider about eating and drinking, which may include:  8 hours before the procedure - stop eating heavy meals or foods such as meat, fried foods, or fatty foods.  6 hours before the procedure - stop eating light meals or foods, such as toast or cereal.  6 hours before the procedure - stop drinking milk or drinks that contain milk.  2 hours before the procedure - stop drinking clear liquids.  Medicines  Ask your health care provider  about: ? Changing or stopping your regular medicines. This is especially important if you are taking diabetes medicines or blood thinners. ? Taking medicines such as aspirin and ibuprofen. These medicines can thin your blood.  Do not take these medicines before your procedure if your health care provider instructs you not to. ? Taking new dietary supplements or medicines. Do not take these during the week before your procedure unless your health care provider approves them.  If you are told to take a medicine or to continue taking a medicine on the day of the procedure, take the medicine with sips of water. General instructions   Ask if you will be going home the same day, the following day, or after a longer hospital stay. ? Plan to have someone take you home. ? Plan to have someone stay with you for the first 24 hours after you leave the hospital or clinic.  For 3-6 weeks before the procedure, try not to use any tobacco products, such as cigarettes, chewing tobacco, and e-cigarettes.  You may brush your teeth on the morning of the procedure, but make sure to spit out the toothpaste. What happens during the procedure?  You will be given anesthetics through a mask and through an IV tube in one of your veins.  You may receive medicine to help you relax (sedative).  As soon as you are asleep, a breathing tube may be used to help you breathe.  An anesthesia specialist will stay with you throughout the procedure. He or she will help keep you comfortable and safe by continuing to give you medicines and adjusting the amount of medicine that you get. He or she will also watch your blood pressure, pulse, and oxygen levels to make sure that the anesthetics do not cause any problems.  If a breathing tube was used to help you breathe, it will be removed before you wake up. The procedure may vary among health care providers and hospitals. What happens after the procedure?  You will wake up, often  slowly, after the procedure is complete, usually in a recovery area.  Your blood pressure, heart rate, breathing rate, and blood oxygen level will be monitored until the medicines you were given have worn off.  You may be given medicine to help you calm down if you feel anxious or agitated.  If you will be going home the same day, your health care provider may check to make sure you can stand, drink, and urinate.  Your health care providers will treat your pain and side effects before you go home.  Do not drive for 24 hours if you received a sedative.  You may: ? Feel nauseous and vomit. ? Have a sore throat. ? Have mental slowness. ? Feel cold or shivery. ? Feel sleepy. ? Feel tired. ? Feel sore or achy, even in parts of your body where you did not have surgery. This information is not intended to replace advice given to you by your health care provider. Make sure you discuss any questions you have with your health care provider. Document Released: 10/02/2007 Document Revised: 12/06/2015 Document Reviewed: 06/09/2015 Elsevier Interactive Patient Education  2018 [place] Anesthesia, Adult, Care After These instructions provide you with information about caring for yourself after your procedure. Your health care provider may also give you more specific instructions. Your treatment has been planned according to current medical practices, but problems sometimes occur. Call your health care provider if you have any problems or questions after your procedure. What can I expect after the procedure? After the procedure, it is common to have:  Vomiting.  A sore throat.  Mental slowness.  It is common to feel:  Nauseous.  Cold or shivery.  Sleepy.  Tired.  Sore or achy, even in parts of your body where you did not have surgery.  Follow these instructions at home: For at least 24 hours after the procedure:  Do not: ? Participate in activities where you could fall  or become injured. ? Drive. ? Use heavy machinery. ? Drink alcohol. ? Take sleeping pills or medicines that cause drowsiness. ? Make important decisions or sign legal documents. ? Take care of children on your own.  Rest. Eating and drinking  If you vomit, drink water, juice, or soup when you can drink without vomiting.  Drink enough fluid to keep your urine clear or pale yellow.  Make sure you have little or no nausea before eating solid foods.  Follow the diet recommended by your health care provider. General instructions  Have a responsible adult stay with you until you are awake and alert.  Return to your normal activities as told by your health care provider. Ask your health care provider what activities are safe for you.  Take over-the-counter and prescription medicines only as told by your health care provider.  If you smoke, do not smoke without supervision.  Keep all follow-up visits as told by your health care provider. This is important. Contact a health care provider if:  You continue to have nausea or vomiting at home, and medicines are not helpful.  You cannot drink fluids or start eating again.  You cannot urinate after 8-12 hours.  You develop a skin rash.  You have fever.  You have increasing redness at the site of your procedure. Get help right away if:  You have difficulty breathing.  You have chest pain.  You have unexpected bleeding.  You feel that you are having a life-threatening or urgent problem. This information is not intended to replace advice given to you by your health care provider. Make sure you discuss any questions you have with your health care provider. Document Released: 10/01/2000 Document Revised: 11/28/2015 Document Reviewed: 06/09/2015 Elsevier Interactive Patient Education  [employer].

## 2017-01-25 NOTE — Pre-Procedure Instructions (Signed)
Dr Patsey Berthold aware of 3.0 potassium. Per his instructions, I called patient and instructed her to tale 20 mEq TID with meals until surgery. Will also do ISTAT am of surgery.

## 2017-01-26 LAB — TYPE AND SCREEN
ABO/RH(D): O POS
ANTIBODY SCREEN: NEGATIVE

## 2017-01-29 ENCOUNTER — Encounter (HOSPITAL_COMMUNITY)
Admission: RE | Disposition: A | Discharge status: Home / Self Care | Payer: Self-pay | Source: Ambulatory Visit | Attending: Obstetrics and Gynecology

## 2017-01-29 ENCOUNTER — Inpatient Hospital Stay (HOSPITAL_COMMUNITY): Payer: Medicaid Other | Admitting: Anesthesiology

## 2017-01-29 ENCOUNTER — Encounter (HOSPITAL_COMMUNITY): Payer: Self-pay | Admitting: Anesthesiology

## 2017-01-29 ENCOUNTER — Inpatient Hospital Stay (HOSPITAL_COMMUNITY)
Admission: RE | Admit: 2017-01-29 | Discharge: 2017-01-30 | DRG: 743 | Disposition: A | Discharge status: Home / Self Care | Payer: Medicaid Other | Source: Ambulatory Visit | Attending: Obstetrics and Gynecology | Admitting: Obstetrics and Gynecology

## 2017-01-29 DIAGNOSIS — D259 Leiomyoma of uterus, unspecified: Principal | ICD-10-CM | POA: Diagnosis present

## 2017-01-29 DIAGNOSIS — N920 Excessive and frequent menstruation with regular cycle: Secondary | ICD-10-CM | POA: Diagnosis present

## 2017-01-29 DIAGNOSIS — N736 Female pelvic peritoneal adhesions (postinfective): Secondary | ICD-10-CM | POA: Diagnosis present

## 2017-01-29 DIAGNOSIS — Z87891 Personal history of nicotine dependence: Secondary | ICD-10-CM | POA: Diagnosis not present

## 2017-01-29 DIAGNOSIS — E785 Hyperlipidemia, unspecified: Secondary | ICD-10-CM | POA: Diagnosis present

## 2017-01-29 DIAGNOSIS — I10 Essential (primary) hypertension: Secondary | ICD-10-CM | POA: Diagnosis present

## 2017-01-29 DIAGNOSIS — Z96659 Presence of unspecified artificial knee joint: Secondary | ICD-10-CM | POA: Diagnosis present

## 2017-01-29 DIAGNOSIS — Z6836 Body mass index (BMI) 36.0-36.9, adult: Secondary | ICD-10-CM | POA: Diagnosis not present

## 2017-01-29 DIAGNOSIS — Z90711 Acquired absence of uterus with remaining cervical stump: Secondary | ICD-10-CM | POA: Diagnosis present

## 2017-01-29 DIAGNOSIS — D251 Intramural leiomyoma of uterus: Secondary | ICD-10-CM | POA: Diagnosis not present

## 2017-01-29 DIAGNOSIS — D252 Subserosal leiomyoma of uterus: Secondary | ICD-10-CM | POA: Diagnosis not present

## 2017-01-29 DIAGNOSIS — L905 Scar conditions and fibrosis of skin: Secondary | ICD-10-CM | POA: Diagnosis present

## 2017-01-29 DIAGNOSIS — Z9851 Tubal ligation status: Secondary | ICD-10-CM

## 2017-01-29 DIAGNOSIS — N800 Endometriosis of uterus: Secondary | ICD-10-CM | POA: Diagnosis not present

## 2017-01-29 HISTORY — PX: BILATERAL SALPINGECTOMY: SHX5743

## 2017-01-29 HISTORY — PX: SUPRACERVICAL ABDOMINAL HYSTERECTOMY: SHX5393

## 2017-01-29 SURGERY — HYSTERECTOMY, SUPRACERVICAL, ABDOMINAL
Anesthesia: General | Site: Abdomen | Laterality: Right

## 2017-01-29 MED ORDER — LIDOCAINE HCL (CARDIAC) 20 MG/ML IV SOLN
INTRAVENOUS | Status: DC | PRN
  Administered 2017-01-29: 40 mg via INTRAVENOUS

## 2017-01-29 MED ORDER — ONDANSETRON HCL 4 MG/2ML IJ SOLN
4.0000 mg | Freq: Once | INTRAMUSCULAR | Status: AC
  Administered 2017-01-29: 4 mg via INTRAVENOUS

## 2017-01-29 MED ORDER — GLYCOPYRROLATE 0.2 MG/ML IJ SOLN
INTRAMUSCULAR | Status: AC
  Filled 2017-01-29: qty 3

## 2017-01-29 MED ORDER — LACTATED RINGERS IV SOLN
INTRAVENOUS | Status: DC
  Administered 2017-01-29: 10:00:00 via INTRAVENOUS

## 2017-01-29 MED ORDER — HYDROCHLOROTHIAZIDE 25 MG PO TABS
25.0000 mg | ORAL_TABLET | Freq: Every day | ORAL | Status: DC
  Administered 2017-01-30: 25 mg via ORAL
  Filled 2017-01-29: qty 1

## 2017-01-29 MED ORDER — ONDANSETRON HCL 4 MG/2ML IJ SOLN: 4.0000 mg | Freq: Four times a day (QID) | INTRAMUSCULAR | Status: DC | PRN

## 2017-01-29 MED ORDER — PANTOPRAZOLE SODIUM 40 MG PO TBEC
40.0000 mg | DELAYED_RELEASE_TABLET | Freq: Every day | ORAL | Status: DC
  Administered 2017-01-30: 40 mg via ORAL
  Filled 2017-01-29: qty 1

## 2017-01-29 MED ORDER — PROPOFOL 10 MG/ML IV BOLUS
INTRAVENOUS | Status: DC | PRN
  Administered 2017-01-29: 150 mg via INTRAVENOUS

## 2017-01-29 MED ORDER — LIDOCAINE HCL (PF) 1 % IJ SOLN
INTRAMUSCULAR | Status: AC
  Filled 2017-01-29: qty 5

## 2017-01-29 MED ORDER — MIDAZOLAM HCL 2 MG/2ML IJ SOLN
INTRAMUSCULAR | Status: AC
  Filled 2017-01-29: qty 2

## 2017-01-29 MED ORDER — ONDANSETRON HCL 4 MG PO TABS: 4.0000 mg | ORAL_TABLET | Freq: Four times a day (QID) | ORAL | Status: DC | PRN

## 2017-01-29 MED ORDER — IBUPROFEN 600 MG PO TABS: 600.0000 mg | ORAL_TABLET | Freq: Four times a day (QID) | ORAL | Status: DC | PRN

## 2017-01-29 MED ORDER — FENTANYL CITRATE (PF) 100 MCG/2ML IJ SOLN
INTRAMUSCULAR | Status: DC | PRN
  Administered 2017-01-29: 50 ug via INTRAVENOUS
  Administered 2017-01-29: 25 ug via INTRAVENOUS
  Administered 2017-01-29 (×4): 50 ug via INTRAVENOUS
  Administered 2017-01-29: 25 ug via INTRAVENOUS
  Administered 2017-01-29: 50 ug via INTRAVENOUS

## 2017-01-29 MED ORDER — HYDROMORPHONE HCL 1 MG/ML IJ SOLN
0.2500 mg | INTRAMUSCULAR | Status: DC | PRN
  Administered 2017-01-29 (×2): 0.5 mg via INTRAVENOUS
  Filled 2017-01-29: qty 1

## 2017-01-29 MED ORDER — ROCURONIUM BROMIDE 100 MG/10ML IV SOLN
INTRAVENOUS | Status: DC | PRN
  Administered 2017-01-29: 40 mg via INTRAVENOUS
  Administered 2017-01-29: 10 mg via INTRAVENOUS

## 2017-01-29 MED ORDER — 0.9 % SODIUM CHLORIDE (POUR BTL) OPTIME
TOPICAL | Status: DC | PRN
  Administered 2017-01-29: 2000 mL

## 2017-01-29 MED ORDER — LOSARTAN POTASSIUM-HCTZ 100-25 MG PO TABS: 1.0000 | ORAL_TABLET | Freq: Every day | ORAL | Status: DC

## 2017-01-29 MED ORDER — BUPIVACAINE HCL (PF) 0.5 % IJ SOLN
INTRAMUSCULAR | Status: AC
  Filled 2017-01-29: qty 30

## 2017-01-29 MED ORDER — LACTATED RINGERS IV SOLN
INTRAVENOUS | Status: DC | PRN
  Administered 2017-01-29 (×3): via INTRAVENOUS

## 2017-01-29 MED ORDER — MIDAZOLAM HCL 2 MG/2ML IJ SOLN
1.0000 mg | INTRAMUSCULAR | Status: DC
  Administered 2017-01-29: 2 mg via INTRAVENOUS

## 2017-01-29 MED ORDER — DEXAMETHASONE SODIUM PHOSPHATE 4 MG/ML IJ SOLN
INTRAMUSCULAR | Status: DC | PRN
  Administered 2017-01-29: 4 mg via INTRAVENOUS

## 2017-01-29 MED ORDER — HYDROMORPHONE HCL 1 MG/ML IJ SOLN
0.2500 mg | INTRAMUSCULAR | Status: DC | PRN
  Administered 2017-01-29 (×4): 0.5 mg via INTRAVENOUS
  Filled 2017-01-29 (×2): qty 1

## 2017-01-29 MED ORDER — NEOSTIGMINE METHYLSULFATE 10 MG/10ML IV SOLN
INTRAVENOUS | Status: DC | PRN
  Administered 2017-01-29: 4 mg via INTRAVENOUS

## 2017-01-29 MED ORDER — BUPIVACAINE HCL (PF) 0.5 % IJ SOLN
INTRAMUSCULAR | Status: DC | PRN
  Administered 2017-01-29: 37 mL
  Administered 2017-01-29: 20 mL

## 2017-01-29 MED ORDER — ATORVASTATIN CALCIUM 20 MG PO TABS
20.0000 mg | ORAL_TABLET | Freq: Every day | ORAL | Status: DC
  Administered 2017-01-29: 20 mg via ORAL
  Filled 2017-01-29: qty 1

## 2017-01-29 MED ORDER — KETOROLAC TROMETHAMINE 30 MG/ML IJ SOLN
30.0000 mg | Freq: Four times a day (QID) | INTRAMUSCULAR | Status: DC
  Administered 2017-01-29 – 2017-01-30 (×4): 30 mg via INTRAVENOUS
  Filled 2017-01-29 (×4): qty 1

## 2017-01-29 MED ORDER — SODIUM CHLORIDE 0.9 % IV SOLN
INTRAVENOUS | Status: DC
  Administered 2017-01-29 – 2017-01-30 (×2): via INTRAVENOUS

## 2017-01-29 MED ORDER — ARTIFICIAL TEARS OPHTHALMIC OINT
TOPICAL_OINTMENT | OPHTHALMIC | Status: AC
  Filled 2017-01-29: qty 3.5

## 2017-01-29 MED ORDER — HYDROMORPHONE 1 MG/ML IV SOLN
INTRAVENOUS | Status: AC
  Filled 2017-01-29: qty 25

## 2017-01-29 MED ORDER — KETOROLAC TROMETHAMINE 30 MG/ML IJ SOLN: 30.0000 mg | Freq: Four times a day (QID) | INTRAMUSCULAR | Status: DC

## 2017-01-29 MED ORDER — EPHEDRINE SULFATE 50 MG/ML IJ SOLN
INTRAMUSCULAR | Status: DC | PRN
  Administered 2017-01-29: 10 mg via INTRAVENOUS

## 2017-01-29 MED ORDER — FENTANYL CITRATE (PF) 250 MCG/5ML IJ SOLN
INTRAMUSCULAR | Status: AC
  Filled 2017-01-29: qty 5

## 2017-01-29 MED ORDER — GLYCOPYRROLATE 0.2 MG/ML IJ SOLN
INTRAMUSCULAR | Status: DC | PRN
  Administered 2017-01-29: 0.2 mg via INTRAVENOUS
  Administered 2017-01-29: 0.6 mg via INTRAVENOUS

## 2017-01-29 MED ORDER — ROCURONIUM BROMIDE 50 MG/5ML IV SOLN
INTRAVENOUS | Status: AC
  Filled 2017-01-29: qty 1

## 2017-01-29 MED ORDER — PROPOFOL 10 MG/ML IV BOLUS
INTRAVENOUS | Status: AC
  Filled 2017-01-29: qty 20

## 2017-01-29 MED ORDER — DIPHENHYDRAMINE HCL 12.5 MG/5ML PO ELIX: 12.5000 mg | ORAL_SOLUTION | Freq: Four times a day (QID) | ORAL | Status: DC | PRN

## 2017-01-29 MED ORDER — OXYCODONE-ACETAMINOPHEN 5-325 MG PO TABS
1.0000 | ORAL_TABLET | ORAL | Status: DC | PRN
  Administered 2017-01-30: 1 via ORAL
  Filled 2017-01-29: qty 1

## 2017-01-29 MED ORDER — KETOROLAC TROMETHAMINE 30 MG/ML IJ SOLN
INTRAMUSCULAR | Status: AC
  Filled 2017-01-29: qty 1

## 2017-01-29 MED ORDER — SODIUM CHLORIDE 0.9% FLUSH
9.0000 mL | INTRAVENOUS | Status: DC | PRN
Start: 2017-01-29 — End: 2017-01-30

## 2017-01-29 MED ORDER — SUCCINYLCHOLINE CHLORIDE 20 MG/ML IJ SOLN
INTRAMUSCULAR | Status: AC
  Filled 2017-01-29: qty 1

## 2017-01-29 MED ORDER — HYDROMORPHONE 1 MG/ML IV SOLN
INTRAVENOUS | Status: DC
  Administered 2017-01-29: 16:00:00 via INTRAVENOUS

## 2017-01-29 MED ORDER — EPHEDRINE SULFATE 50 MG/ML IJ SOLN
INTRAMUSCULAR | Status: AC
  Filled 2017-01-29: qty 1

## 2017-01-29 MED ORDER — ONDANSETRON HCL 4 MG/2ML IJ SOLN
INTRAMUSCULAR | Status: AC
  Filled 2017-01-29: qty 2

## 2017-01-29 MED ORDER — LOSARTAN POTASSIUM 50 MG PO TABS
100.0000 mg | ORAL_TABLET | Freq: Every day | ORAL | Status: DC
  Administered 2017-01-30: 100 mg via ORAL
  Filled 2017-01-29: qty 2

## 2017-01-29 MED ORDER — FENTANYL CITRATE (PF) 100 MCG/2ML IJ SOLN
INTRAMUSCULAR | Status: AC
  Filled 2017-01-29: qty 2

## 2017-01-29 MED ORDER — KETOROLAC TROMETHAMINE 30 MG/ML IJ SOLN
30.0000 mg | Freq: Once | INTRAMUSCULAR | Status: AC
  Administered 2017-01-29: 30 mg via INTRAVENOUS

## 2017-01-29 MED ORDER — NALOXONE HCL 0.4 MG/ML IJ SOLN: 0.4000 mg | INTRAMUSCULAR | Status: DC | PRN

## 2017-01-29 MED ORDER — CEFAZOLIN SODIUM-DEXTROSE 2-4 GM/100ML-% IV SOLN
2.0000 g | INTRAVENOUS | Status: AC
  Administered 2017-01-29: 2 g via INTRAVENOUS
  Filled 2017-01-29: qty 100

## 2017-01-29 MED ORDER — DIPHENHYDRAMINE HCL 50 MG/ML IJ SOLN: 12.5000 mg | Freq: Four times a day (QID) | INTRAMUSCULAR | Status: DC | PRN

## 2017-01-29 MED ORDER — AMLODIPINE BESYLATE 5 MG PO TABS
10.0000 mg | ORAL_TABLET | Freq: Every day | ORAL | Status: DC
  Administered 2017-01-30: 10 mg via ORAL
  Filled 2017-01-29: qty 2

## 2017-01-29 MED ORDER — SODIUM CHLORIDE 0.9 % IJ SOLN
INTRAMUSCULAR | Status: AC
  Filled 2017-01-29: qty 20

## 2017-01-29 SURGICAL SUPPLY — 52 items
APL SKNCLS STERI-STRIP NONHPOA (GAUZE/BANDAGES/DRESSINGS) ×2
BAG HAMPER (MISCELLANEOUS) ×3 IMPLANT
BENZOIN TINCTURE PRP APPL 2/3 (GAUZE/BANDAGES/DRESSINGS) ×3 IMPLANT
BLADE SURG SZ10 CARB STEEL (BLADE) ×3 IMPLANT
CLOTH BEACON ORANGE TIMEOUT ST (SAFETY) ×3 IMPLANT
COVER LIGHT HANDLE STERIS (MISCELLANEOUS) ×6 IMPLANT
DECANTER SPIKE VIAL GLASS SM (MISCELLANEOUS) ×6 IMPLANT
DRAPE WARM FLUID 44X44 (DRAPE) ×3 IMPLANT
DRSG OPSITE POSTOP 4X10 (GAUZE/BANDAGES/DRESSINGS) ×6 IMPLANT
DRSG OPSITE POSTOP 4X8 (GAUZE/BANDAGES/DRESSINGS) ×3 IMPLANT
DURAPREP 26ML APPLICATOR (WOUND CARE) ×3 IMPLANT
ELECT REM PT RETURN 9FT ADLT (ELECTROSURGICAL) ×3
ELECTRODE REM PT RTRN 9FT ADLT (ELECTROSURGICAL) ×2 IMPLANT
EVACUATOR DRAINAGE 10X20 100CC (DRAIN) ×2 IMPLANT
EVACUATOR SILICONE 100CC (DRAIN) ×3
FORMALIN 10 PREFIL 480ML (MISCELLANEOUS) ×3 IMPLANT
GAUZE SPONGE 4X4 12PLY STRL (GAUZE/BANDAGES/DRESSINGS) ×3 IMPLANT
GLOVE BIOGEL PI IND STRL 7.0 (GLOVE) ×6 IMPLANT
GLOVE BIOGEL PI IND STRL 9 (GLOVE) ×2 IMPLANT
GLOVE BIOGEL PI INDICATOR 7.0 (GLOVE) ×3
GLOVE BIOGEL PI INDICATOR 9 (GLOVE) ×1
GLOVE ECLIPSE 9.0 STRL (GLOVE) ×6 IMPLANT
GOWN SPEC L3 XXLG W/TWL (GOWN DISPOSABLE) ×3 IMPLANT
GOWN STRL REUS W/TWL LRG LVL3 (GOWN DISPOSABLE) ×6 IMPLANT
INST SET MAJOR GENERAL (KITS) ×3 IMPLANT
KIT ROOM TURNOVER APOR (KITS) ×3 IMPLANT
MANIFOLD NEPTUNE II (INSTRUMENTS) ×3 IMPLANT
NEEDLE HYPO 25X1 1.5 SAFETY (NEEDLE) ×3 IMPLANT
NS IRRIG 1000ML POUR BTL (IV SOLUTION) ×6 IMPLANT
PACK ABDOMINAL MAJOR (CUSTOM PROCEDURE TRAY) ×3 IMPLANT
PAD ARMBOARD 7.5X6 YLW CONV (MISCELLANEOUS) ×3 IMPLANT
RETRACTOR WND ALEXIS 25 LRG (MISCELLANEOUS) ×2 IMPLANT
RTRCTR WOUND ALEXIS 25CM LRG (MISCELLANEOUS) ×3
SET BASIN LINEN APH (SET/KITS/TRAYS/PACK) ×3 IMPLANT
SPONGE LAP 18X18 X RAY DECT (DISPOSABLE) ×3 IMPLANT
STRIP CLOSURE SKIN 1/2X4 (GAUZE/BANDAGES/DRESSINGS) ×6 IMPLANT
SUT CHROMIC 0 CT 1 (SUTURE) ×33 IMPLANT
SUT CHROMIC 2 0 CT 1 (SUTURE) ×9 IMPLANT
SUT ETHILON 3 0 FSL (SUTURE) ×3 IMPLANT
SUT PDS AB CT VIOLET #0 27IN (SUTURE) ×3 IMPLANT
SUT PLAIN 2 0 XLH (SUTURE) ×3 IMPLANT
SUT PLAIN CT 1/2CIR 2-0 27IN (SUTURE) ×6 IMPLANT
SUT PROLENE 0 CT 1 30 (SUTURE) IMPLANT
SUT VIC AB 0 CT1 27 (SUTURE)
SUT VIC AB 0 CT1 27XBRD ANTBC (SUTURE) IMPLANT
SUT VICRYL 4 0 KS 27 (SUTURE) ×6 IMPLANT
SUT VICRYL AB 2 0 TIES (SUTURE) ×3 IMPLANT
SYR BULB IRRIGATION 50ML (SYRINGE) ×3 IMPLANT
SYR CONTROL 10ML LL (SYRINGE) ×3 IMPLANT
TOWEL BLUE STERILE X RAY DET (MISCELLANEOUS) ×3 IMPLANT
TOWEL OR 17X26 4PK STRL BLUE (TOWEL DISPOSABLE) ×3 IMPLANT
TRAY FOLEY W/METER SILVER 16FR (SET/KITS/TRAYS/PACK) ×3 IMPLANT

## 2017-01-29 NOTE — Op Note (Signed)
Please see the brief operative note for surgical details 

## 2017-01-29 NOTE — Transfer of Care (Signed)
Immediate Anesthesia Transfer of Care Note  Patient: Carol Vargas  Procedure(s) Performed: Procedure(s): HYSTERECTOMY SUPRACERVICAL ABDOMINAL (N/A) RIGHT SALPINGECTOMY (Right)  Patient Location: PACU  Anesthesia Type:General  Level of Consciousness: oriented, drowsy and patient cooperative  Airway & Oxygen Therapy: Patient Spontanous Breathing and Patient connected to face mask oxygen  Post-op Assessment: Report given to RN and Post -op Vital signs reviewed and stable  Post vital signs: Reviewed and stable  Last Vitals:  Vitals:   01/29/17 0945 01/29/17 1000  BP: 112/78 116/82  Pulse:    Resp: 19 (!) 29  Temp:      Last Pain:  Vitals:   01/29/17 0929  TempSrc: Oral  PainSc: 2       Patients Stated Pain Goal: 8 (49/35/52 1747)  Complications: No apparent anesthesia complications
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
## 2017-01-29 NOTE — Anesthesia Procedure Notes (Signed)
Procedure Name: Intubation Performed by: ADAMS, AMY A Pre-anesthesia Checklist: Patient identified, Patient being monitored, Timeout performed, Emergency Drugs available and Suction available Patient Re-evaluated:Patient Re-evaluated prior to induction Oxygen Delivery Method: Circle System Utilized Preoxygenation: Pre-oxygenation with 100% oxygen Induction Type: IV induction Ventilation: Mask ventilation without difficulty Laryngoscope Size: Miller and 3 Grade View: Grade I Tube type: Oral Tube size: 7.0 mm Number of attempts: 1 Airway Equipment and Method: Stylet Placement Confirmation: ETT inserted through vocal cords under direct vision,  positive ETCO2 and breath sounds checked- equal and bilateral Secured at: 21 cm Tube secured with: Tape Dental Injury: Teeth and Oropharynx as per pre-operative assessment

## 2017-01-29 NOTE — Op Note (Signed)
01/29/2017  1:07 PM  PATIENT:  Carol Vargas  41 y.o. female  PRE-OPERATIVE DIAGNOSIS:  Menorhagia Irregular cycle Submucous and Sebserous Leiomyoma of Uterus  POST-OPERATIVE DIAGNOSIS:  Menorhagia Irregular cycle, submucous and subserous leiomyoma of the uterus pelvic adhesions  PROCEDURE:  Procedure(s): HYSTERECTOMY SUPRACERVICAL ABDOMINAL (N/A) RIGHT SALPINGECTOMY (Right)  SURGEON:  Surgeon(s) and Role:    Jonnie Kind, MD - Primary  PHYSICIAN ASSISTANT:   ASSISTANTS: Hinda Glatter RNFA   ANESTHESIA:   local and general  EBL:  Total I/O In: 2000 [I.V.:2000] Out: 400 [Urine:200; Blood:200]  BLOOD ADMINISTERED:none  DRAINS: (One 10 mm) Jackson-Pratt drain(s) with closed bulb suction in the Subcutaneous space and Urinary Catheter (Foley)   LOCAL MEDICATIONS USED:  MARCAINE    and Amount: 40 ml  SPECIMEN:  Source of Specimen:  Uterus, right fallopian tube  DISPOSITION OF SPECIMEN:  PATHOLOGY  COUNTS:  YES  TOURNIQUET:  * No tourniquets in log *  DICTATION: .Dragon Dictation  PLAN OF CARE: Admit to inpatient   PATIENT DISPOSITION:  PACU - hemodynamically stable.   Delay start of Pharmacological VTE agent (>24hrs) due to surgical blood loss or risk of bleeding: not applicable Details of procedure: Patient was taken to the operating room prepped and draped for lower abdominal surgery. Previously discussed fibrotic old C-section scar was planned for excision and due to the pannus making the C-section scar chronically irritated and moist plans for excision for an ellipse of the inferior aspects of the pannus as discussed earlier. Was conducted, antibiotics administered surgical procedure confirmed by operative team. Transverse lower abdominal incision was made from just lateral to the anterior superior iliac crest on each side to the midline removing an ellipse of the old C-section scar and the overlying abdominal pannus approximate 40 cm in length 12-14 cm in  width at its maximum. Subcutaneous fat was removed as well with a layer of fatty tissue left on the fascia. Midline vertical incision was made into the abdominal cavity from the suprapubic area to the umbilicus with careful entry of the abdominal cavity. Some omental adhesions to the anterior abdominal wall were encountered and dissected free. The omentum was inspected and made hemostatic with point cautery as necessary. The patient had a bradycardic response to the manipulation of the bowel and omentum and Marcaine was instilled into the abdomen comes 30 cc to assist with patient intolerance of the manipulation necessary. Since wound retractor was positioned with bowel packed away and the large fibroid uterus could be identified. There was significant adhesions to the bladder flap area to the lower uterine segment. The round ligament was taken down on the left side there were some significant adhesions in this area. These were taken down ligament was taken down the bladder flap developed anteriorly the thin extensive adhesions dissected sharply remaining carefully close to the uterine body and there were no difficulties encountered. Urine remained clear. Never any suspicion of bladder injury. The utero ovarian ligament could be isolated doubly clamped cut and suture ligated. Previous tubal ligation on the side of the left very little fallopian tube remnant and because of some ecchymosis around this ovary was felt to the tube was more risk for further adhesions and it was potential benefits of her previous discussions with the patient in intraoperative judgment and left the fallopian tube on the side. Cells were isolated doubly clamped with curved Heaney clamps Kelley clamp placed to control backbleeding, transected and doubly ligated with 0 chromic. The uterine artery on this  side was rather deeply. In the side of the lower uterine segment because of the fibroids position and required an additional straight Heaney  clamp down over the uterine artery to maintain of optimal control of the bleeding this was also ligated and hemostasis was satisfactory. On the patient's right side a similar dissection process was developed and the right utero-ovarian ligament was more easily accessed. Automated was more easily visualized. The uterine vessels were clamped cut and suture ligated on the right side the uterine body was amputated off the lower uterine segment. An additional suture was placed on the uterine artery on the patient's left side to complete Hemostasis. The lower uterine segment was then cored out and a conical fashion and the specimen also sent with the uterus. The cervical stump was then closed front to back with a series of interrupted 0 chromic sutures pelvis was irrigated. The right round ligament had a vein that escaped from the pedicle with the manipulation necessary to inspect the pelvis and this was again oversewn hemostasis was confirmed. Pelvis was irrigated. It was inspected and considered stable well away from the surgical area of suturing. After irrigation the pelvis, the laparotomy equipment was removed, and the anterior peritoneum closed with 2-0 chromic closure of the peritoneal edges, 0 PDS closure of the fascia and a continuous running fashion and then effort made to recontour the abdomen sufficiently for skin edge approximation. Still some residual retraction in the inferior margin of the incision and so 3 sites were used to release the fibrosis that made in the inferior margin of the incision age 58 about 5 mm release. This allowed the skin edges to reapproximate and subcutaneous 20 plain horizontal mattress sutures were used to reapproximate the fatty tissue with a 10 mm flat JP drain placed in the subcutaneous fatty space and allowed to exit through a separate stab incision on the left lower quadrant of the abdomen. She didn't place. The skin edges were brought and adequate approximation with  subcutaneous deep mattress sutures and then subcuticular 4-0 Vicryl was used to close the skin edges. J-P drain was placed to active continuous bulb suction. Steri-Strips were applied benzoin in place to hold them in place. The dressing honeycomb was applied and patient to recovery room in stable condition

## 2017-01-29 NOTE — Anesthesia Preprocedure Evaluation (Signed)
Anesthesia Evaluation  Patient identified by MRN, date of birth, ID band Patient awake    Reviewed: Allergy & Precautions, NPO status , Patient's Chart, lab work & pertinent test results  Airway Mallampati: II   Neck ROM: full    Dental  (+) Teeth Intact   Pulmonary sleep apnea , former smoker,    breath sounds clear to auscultation       Cardiovascular hypertension,  Rhythm:regular Rate:Normal     Neuro/Psych  Headaches,    GI/Hepatic neg GERD  ,  Endo/Other  Morbid obesity  Renal/GU      Musculoskeletal  (+) Arthritis ,   Abdominal   Peds  Hematology   Anesthesia Other Findings   Reproductive/Obstetrics                             Anesthesia Physical Anesthesia Plan  ASA: II  Anesthesia Plan: General   Post-op Pain Management:    Induction: Intravenous  PONV Risk Score and Plan:   Airway Management Planned: Oral ETT  Additional Equipment:   Intra-op Plan:   Post-operative Plan: Extubation in OR  Informed Consent: I have reviewed the patients History and Physical, chart, labs and discussed the procedure including the risks, benefits and alternatives for the proposed anesthesia with the patient or authorized representative who has indicated his/her understanding and acceptance.     Plan Discussed with:   Anesthesia Plan Comments:         Anesthesia Quick Evaluation

## 2017-01-29 NOTE — Anesthesia Postprocedure Evaluation (Signed)
Anesthesia Post Note  Patient: Carol Vargas  Procedure(s) Performed: Procedure(s) (LRB): HYSTERECTOMY SUPRACERVICAL ABDOMINAL (N/A) RIGHT SALPINGECTOMY (Right)  Patient location during evaluation: PACU Anesthesia Type: General Level of consciousness: awake and alert, oriented and patient cooperative Pain management: pain level controlled Vital Signs Assessment: post-procedure vital signs reviewed and stable Respiratory status: spontaneous breathing Cardiovascular status: stable Postop Assessment: no signs of nausea or vomiting Anesthetic complications: no     Last Vitals:  Vitals:   01/29/17 1345 01/29/17 1357  BP: 131/75   Pulse: 61 (!) 57  Resp: 18 16  Temp:      Last Pain:  Vitals:   01/29/17 1357  TempSrc:   PainSc: 8                  Waylyn Tenbrink A

## 2017-01-30 ENCOUNTER — Encounter (HOSPITAL_COMMUNITY): Payer: Self-pay | Admitting: Obstetrics and Gynecology

## 2017-01-30 LAB — POCT I-STAT 4, (NA,K, GLUC, HGB,HCT)
GLUCOSE: 100 mg/dL — AB (ref 65–99)
HCT: 42 % (ref 36.0–46.0)
HEMOGLOBIN: 14.3 g/dL (ref 12.0–15.0)
POTASSIUM: 4.3 mmol/L (ref 3.5–5.1)
Sodium: 142 mmol/L (ref 135–145)

## 2017-01-30 LAB — BASIC METABOLIC PANEL
ANION GAP: 7 (ref 5–15)
BUN: 10 mg/dL (ref 6–20)
CHLORIDE: 103 mmol/L (ref 101–111)
CO2: 28 mmol/L (ref 22–32)
Calcium: 8.6 mg/dL — ABNORMAL LOW (ref 8.9–10.3)
Creatinine, Ser: 0.75 mg/dL (ref 0.44–1.00)
GFR calc non Af Amer: >60 mL/min (ref >60)
GLUCOSE: 110 mg/dL — AB (ref 65–99)
Potassium: 3.9 mmol/L (ref 3.5–5.1)
Sodium: 138 mmol/L (ref 135–145)

## 2017-01-30 LAB — CBC
HEMATOCRIT: 37.8 % (ref 36.0–46.0)
HEMOGLOBIN: 12.6 g/dL (ref 12.0–15.0)
MCH: 31 pg (ref 26.0–34.0)
MCHC: 33.3 g/dL (ref 30.0–36.0)
MCV: 92.9 fL (ref 78.0–100.0)
Platelets: 203 10*3/uL (ref 150–400)
RBC: 4.07 MIL/uL (ref 3.87–5.11)
RDW: 13 % (ref 11.5–15.5)
WBC: 16.8 10*3/uL — AB (ref 4.0–10.5)

## 2017-01-30 MED ORDER — CIPROFLOXACIN HCL 500 MG PO TABS: 500.0000 mg | ORAL_TABLET | Freq: Two times a day (BID) | ORAL | 0 refills | Status: DC

## 2017-01-30 MED ORDER — OXYCODONE-ACETAMINOPHEN 5-325 MG PO TABS: 1.0000 | ORAL_TABLET | ORAL | 0 refills | Status: DC | PRN

## 2017-01-30 NOTE — Discharge Instructions (Signed)
Abdominal Hysterectomy °Abdominal hysterectomy is a surgical procedure to remove the womb (uterus). The uterus is the muscular organ that houses a developing baby. This surgery may be done if: °· You have cancer. °· You have growths (tumors or fibroids) in the uterus. °· You have long-term (chronic) pain. °· You are bleeding. °· Your uterus has slipped down into your vagina (uterine prolapse). °· You have a condition in which the tissue that lines the uterus grows outside of its normal location (endometriosis). °· You have an infection in your uterus. °· You are having problems with your menstrual cycle. ° °Depending on why you are having this procedure, you may also have other reproductive organs removed. These could include: °· The part of your vagina that connects with your uterus (cervix). °· The organs that make eggs (ovaries). °· The tubes that connect the ovaries to the uterus (fallopian tubes). ° °Tell a health care provider about: °· Any allergies you have. °· All medicines you are taking, including vitamins, herbs, eye drops, creams, and over-the-counter medicines. °· Any problems you or family members have had with anesthetic medicines. °· Any blood disorders you have. °· Any surgeries you have had. °· Any medical conditions you have. °· Whether you are pregnant or may be pregnant. °What are the risks? °Generally, this is a safe procedure. However, problems may occur, including: °· Bleeding. °· Infection. °· Allergic reactions to medicines or dyes. °· Damage to other structures or organs. °· Nerve injury. °· Decreased interest in sex or pain during sex. °· Blood clots that can break free and travel to your lungs. ° °What happens before the procedure? °Staying hydrated °Follow instructions from your health care provider about hydration, which may include: °· Up to 2 hours before the procedure - you may continue to drink clear liquids, such as water, clear fruit juice, black coffee, and plain tea ° °Eating  and drinking restrictions °Follow instructions from your health care provider about eating and drinking, which may include: °· 8 hours before the procedure - stop eating heavy meals or foods such as meat, fried foods, or fatty foods. °· 6 hours before the procedure - stop eating light meals or foods, such as toast or cereal. °· 6 hours before the procedure - stop drinking milk or drinks that contain milk. °· 2 hours before the procedure - stop drinking clear liquids. ° °Medicines °· Ask your health care provider about: °? Changing or stopping your regular medicines. This is especially important if you are taking diabetes medicines or blood thinners. °? Taking medicines such as aspirin and ibuprofen. These medicines can thin your blood. Do not take these medicines before your procedure if your health care provider instructs you not to. °· You may be given antibiotic medicine to help prevent infection. Take it as told by your health care provider. °· You may be asked to take laxatives to prevent constipation. °General instructions °· Ask your health care provider how your surgical site will be marked or identified. °· You may be asked to shower with a germ-killing soap. °· Plan to have someone take you home from the hospital. °· Do not use any products that contain nicotine or tobacco, such as cigarettes and e-cigarettes. If you need help quitting, ask your health care provider. °· You may have an exam or testing. °· You may have a blood or urine sample taken. °· You may need to have an enema to clean out your rectum and lower colon. °· This   procedure can affect the way you feel about yourself. Talk to your health care provider about the physical and emotional changes this procedure may cause. What happens during the procedure?  To lower your risk of infection: ? Your health care team will wash or sanitize their hands. ? Your skin will be washed with soap. ? Hair may be removed from the surgical area.  An IV  tube will be inserted into one of your veins.  You will be given one or more of the following: ? A medicine to help you relax (sedative). ? A medicine to make you fall asleep (general anesthetic).  Tight-fitting (compression) stockings will be placed on your legs to promote circulation.  A thin, flexible tube (catheter) will be inserted to help drain your urine.  The surgeon will make a cut (incision) through the skin in your lower belly. The incision may go side-to-side or up-and-down.  The surgeon will move aside the body tissue that covers your uterus. The surgeon will then carefully take out your uterus along with any of the other organs that need to be removed.  Bleeding will be controlled with clamps or sutures.  The surgeon will close your incision with stitches (sutures), skin glue, or adhesive strips.  A bandage (dressing) will be placed over the incision. The procedure may vary among health care providers and hospitals. What happens after the procedure?  You will be given pain medicine as needed.  Your blood pressure, heart rate, breathing rate, and blood oxygen level will be monitored until the medicines you were given have worn off.  You will need to stay in the hospital to recover for one to two days. Ask your health care provider how long you will need to stay in the hospital after your procedure.  You may have a liquid diet at first. You will most likely return to your usual diet the day after surgery.  You will still have the urinary catheter in place. It will likely be removed the day after surgery.  You may have to wear compression stockings. These stockings help to prevent blood clots and reduce swelling in your legs.  You will be encouraged to walk as soon as possible. You will also use a device or do breathing exercises to keep your lungs clear.  You may need to use a sanitary napkin for vaginal discharge. Summary  Abdominal hysterectomy is a surgical  procedure to remove the womb (uterus). The uterus is the muscular organ that houses a developing baby.  This procedure can affect the way you feel about yourself. Talk to your health care provider about the physical and emotional changes this procedure may cause.  You will be given medicines for pain after the procedure.  You will need to stay in the hospital to recover. Ask your health care provider how long you will need to stay in the hospital after your procedure. This information is not intended to replace advice given to you by your health care provider. Make sure you discuss any questions you have with your health care provider. Document Released: 06/30/2013 Document Revised: 06/13/2016 Document Reviewed: 06/13/2016 Elsevier Interactive Patient Education  2017 [employer] Abdominal Hysterectomy, Care After This sheet gives you information about how to care for yourself after your procedure. Your doctor may also give you more specific instructions. If you have problems or questions, contact your doctor. Follow these instructions at home: Bathing  Do not take baths, swim, or use a hot tub until your doctor   says it is okay. Ask your doctor if you can take showers. You may only be allowed to take sponge baths for bathing.  Keep the bandage (dressing) dry until your doctor says it can be taken off. Surgical cut ( incision) care  Follow instructions from your doctor about how to take care of your cut from surgery. Make sure you: ? Wash your hands with soap and water before you change your bandage (dressing). If you cannot use soap and water, use hand sanitizer. ? Change your bandage as told by your doctor. ? Leave stitches (sutures), skin glue, or skin tape (adhesive) strips in place. They may need to stay in place for 2 weeks or longer. If tape strips get loose and curl up, you may trim the loose edges. Do not remove tape strips completely unless your doctor says it is okay.  Check  your surgical cut area every day for signs of infection. Check for: ? Redness, swelling, or pain. ? Fluid or blood. ? Warmth. ? Pus or a bad smell. Activity  Do gentle, daily exercise as told by your doctor. You may be told to take short walks every day and go farther each time.  Do not lift anything that is heavier than 10 lb (4.5 kg), or the limit that your doctor tells you, until he or she says that it is safe.  Do not drive or use heavy machinery while taking prescription pain medicine.  Do not drive for 24 hours if you were given a medicine to help you relax (sedative).  Follow your doctor's advice about exercise, driving, and general activities. Ask your doctor what activities are safe for you. Lifestyle  Do not douche, use tampons, or have sex for at least 6 weeks or as told by your doctor.  Do not drink alcohol until your doctor says it is okay.  Drink enough fluid to keep your pee (urine) clear or pale yellow.  Try to have someone at home with you for the first 1-2 weeks to help.  Do not use any products that contain nicotine or tobacco, such as cigarettes and e-cigarettes. These can slow down healing. If you need help quitting, ask your doctor. General instructions  Take over-the-counter and prescription medicines only as told by your doctor.  Do not take aspirin or ibuprofen. These medicines can cause bleeding.  To prevent or treat constipation while you are taking prescription pain medicine, your doctor may suggest that you: ? Drink enough fluid to keep your urine clear or pale yellow. ? Take over-the-counter or prescription medicines. ? Eat foods that are high in fiber, such as:  Fresh fruits and vegetables.  Whole grains.  Beans. ? Limit foods that are high in fat and processed sugars, such as fried and sweet foods.  Keep all follow-up visits as told by your doctor. This is important. Contact a doctor if:  You have chills or fever.  You have redness,  swelling, or pain around your cut.  You have fluid or blood coming from your cut.  Your cut feels warm to the touch.  You have pus or a bad smell coming from your cut.  Your cut breaks open.  You feel dizzy or light-headed.  You have pain or bleeding when you pee.  You keep having watery poop (diarrhea).  You keep feeling sick to your stomach (nauseous) or keep throwing up (vomiting).  You have unusual fluid (discharge) coming from your vagina.  You have a rash.  You have   a reaction to your medicine.  Your pain medicine does not help. Get help right away if:  You have a fever and your symptoms get worse all of a sudden.  You have very bad belly (abdominal) pain.  You are short of breath.  You pass out (faint).  You have pain, swelling, or redness of your leg.  You bleed a lot from your vagina and notice clumps of blood (clots). Summary  Do not take baths, swim, or use a hot tub until your doctor says it is okay. Ask your doctor if you can take showers. You may only be allowed to take sponge baths for bathing.  Follow your doctor's advice about exercise, driving, and general activities. Ask your doctor what activities are safe for you.  Do not lift anything that is heavier than 10 lb (4.5 kg), or the limit that your doctor tells you, until he or she says that it is safe.  Try to have someone at home with you for the first 1-2 weeks to help. This information is not intended to replace advice given to you by your health care provider. Make sure you discuss any questions you have with your health care provider. Document Released: 04/03/2008 Document Revised: 06/13/2016 Document Reviewed: 06/13/2016 Elsevier Interactive Patient Education  2017 [employer] Inc[employer]  

## 2017-01-30 NOTE — Discharge Summary (Addendum)
Physician Discharge Summary  Patient ID: Carol Vargas MRN: [id number] DOB/AGE: 1976/03/17 41 y.o.  Admit date: 01/29/2017 Discharge date: 01/30/2017  Admission Diagnoses:Uterine fibroids, pelvic pain,  Discharge Diagnoses:  Active Problems:   Uterine fibroids   Status post abdominal supracervical subtotal hysterectomy Status post wide excision of old cicatrix and placement of subcutaneous Jackson-Pratt drain   Discharged Condition: good  Hospital Course: The patient was admitted through day surgery underwent abdominal supracervical hysterectomy with right salpingectomy. The left fallopian tube was in place because of bleeding and bruising in the area making the likelihood of adhesions to high for removal of this fallopian tube. She had modest blood loss 400 cc and minimal change in her hemoglobin Postoperatively pain tolerance was quite good and she was "sore but not in pain" at 5 PM on postop day 1 when she was discharged having ambulated and had a bowel movement tolerated regular diet and emptied her own J-P drain Temp:  [97.7 F (36.5 C)-98.9 F (37.2 C)] 98.6 F (37 C) (07/25 1442) Pulse Rate:  [59-77] 76 (07/25 1442) Resp:  [12-20] 18 (07/25 1442) BP: (106-149)/(60-72) 129/62 (07/25 1442) SpO2:  [98 %-100 %] 100 % (07/25 1442)  Consults: None  Significant Diagnostic Studies: labs:  CBC Latest Ref Rng & Units 01/30/2017 01/29/2017 01/24/2017  WBC 4.0 - 10.5 K/uL 16.8(H) - 8.3  Hemoglobin 12.0 - 15.0 g/dL 12.6 14.3 13.9  Hematocrit 36.0 - 46.0 % 37.8 42.0 40.7  Platelets 150 - 400 K/uL 203 - 224   BMP Latest Ref Rng & Units 01/30/2017 01/29/2017 01/24/2017  Glucose 65 - 99 mg/dL 110(H) 100(H) 108(H)  BUN 6 - 20 mg/dL 10 - 16  Creatinine 0.44 - 1.00 mg/dL 0.75 - 0.96  BUN/Creat Ratio 9 - 23 - - -  Sodium 135 - 145 mmol/L 138 142 142  Potassium 3.5 - 5.1 mmol/L 3.9 4.3 3.0(L)  Chloride 101 - 111 mmol/L 103 - 106  CO2 22 - 32 mmol/L 28 - 27  Calcium 8.9 - 10.3 mg/dL 8.6(L) -  9.2     Treatments: surgery: Abdominal supracervical hysterectomy right salpingectomy, with wide excision of old cicatrix and placement of sub cutaneous Jackson-Pratt drain  Discharge Exam: Blood pressure 129/62, pulse 76, temperature 98.6 F (37 C), temperature source Oral, resp. rate 18, height 5\' 8"  (1.727 m), weight 240 lb (108.9 kg), last menstrual period 01/06/2017, SpO2 100 %. External genitalia normal Abdomen without well approximated surgical incision with a dry dressing and Jackson-Pratt drain in place to drain 30 cc today bowel sounds active  Disposition: 06-Home-Health Care Svc  Discharge Instructions    Call MD for:  persistant nausea and vomiting    Complete by:  As directed    Call MD for:  redness, tenderness, or signs of infection (pain, swelling, redness, odor or green/yellow discharge around incision site)    Complete by:  As directed    Call MD for:  severe uncontrolled pain    Complete by:  As directed    Call MD for:  temperature >100.4    Complete by:  As directed    Diet - low sodium heart healthy    Complete by:  As directed    Discharge wound care:    Complete by:  As directed    May shower with dressing in place, please measure the amount of drainage from the Jackson-Pratt drain. We will remove it at her first postoperative visit in's 5 or 6 days   Increase activity slowly  Complete by:  As directed      Allergies as of 01/30/2017      Reactions   Lisinopril Cough      Medication List    STOP taking these medications   megestrol 40 MG tablet Commonly known as:  MEGACE   phentermine 37.5 MG capsule   potassium chloride 10 MEQ tablet Commonly known as:  K-DUR     TAKE these medications   acetaminophen 500 MG tablet Commonly known as:  TYLENOL Take 1,000 mg by mouth daily as needed for mild pain or headache.   aluminum chloride 20 % external solution Commonly known as:  DRYSOL Apply topically at bedtime. What changed:  how much to  take   amLODipine 10 MG tablet Commonly known as:  NORVASC Take 1 tablet (10 mg total) by mouth daily. What changed:  when to take this   atorvastatin 20 MG tablet Commonly known as:  LIPITOR Take 1 tablet (20 mg total) by mouth daily.   Geritol Liqd Take 15 mLs by mouth daily.   losartan-hydrochlorothiazide 100-25 MG tablet Commonly known as:  HYZAAR Take 1 tablet by mouth daily. What changed:  when to take this   oxyCODONE-acetaminophen 5-325 MG tablet Commonly known as:  PERCOCET/ROXICET Take 1-2 tablets by mouth every 4 (four) hours as needed for severe pain (moderate to severe pain (when tolerating fluids)).   Turmeric 500 MG Tabs Take 500 mg by mouth every evening.      Follow-up Information    Jonnie Kind, MD Follow up in 5 day(s).   Specialties:  Obstetrics and Gynecology, Radiology Contact information: [address] [place] [address] [phone]           Signed: Jonnie Kind 01/30/2017, 5:04 PM

## 2017-01-30 NOTE — Addendum Note (Signed)
Addendum  created 01/30/17 1032 by Vista Deck, CRNA   Sign clinical note

## 2017-01-30 NOTE — Progress Notes (Signed)
Discharge instructions read to patient and new RX x2 given. Pt verbalized understanding of all instructions. Discharged to home with family

## 2017-01-30 NOTE — Anesthesia Postprocedure Evaluation (Signed)
Anesthesia Post Note  Patient: Carol Vargas  Procedure(s) Performed: Procedure(s) (LRB): HYSTERECTOMY SUPRACERVICAL ABDOMINAL (N/A) RIGHT SALPINGECTOMY (Right)  Patient location during evaluation: Nursing Unit Anesthesia Type: General Level of consciousness: awake and alert Pain management: satisfactory to patient Vital Signs Assessment: post-procedure vital signs reviewed and stable Respiratory status: spontaneous breathing Cardiovascular status: stable Postop Assessment: adequate PO intake and no signs of nausea or vomiting Anesthetic complications: no     Last Vitals:  Vitals:   01/30/17 0400 01/30/17 0839  BP: 119/72   Pulse: 62   Resp: 18 18  Temp: 37.2 C     Last Pain:  Vitals:   01/30/17 0839  TempSrc:   PainSc: 5                  Nil Xiong

## 2017-02-06 ENCOUNTER — Ambulatory Visit (INDEPENDENT_AMBULATORY_CARE_PROVIDER_SITE_OTHER): Payer: Medicaid Other | Admitting: Obstetrics and Gynecology

## 2017-02-06 ENCOUNTER — Encounter: Payer: Self-pay | Admitting: Obstetrics and Gynecology

## 2017-02-06 VITALS — BP 120/88 | HR 85 | Ht 68.0 in | Wt 240.2 lb

## 2017-02-06 DIAGNOSIS — Z9889 Other specified postprocedural states: Secondary | ICD-10-CM

## 2017-02-06 DIAGNOSIS — Z4803 Encounter for change or removal of drains: Secondary | ICD-10-CM

## 2017-02-06 DIAGNOSIS — Z09 Encounter for follow-up examination after completed treatment for conditions other than malignant neoplasm: Secondary | ICD-10-CM

## 2017-02-06 NOTE — Progress Notes (Signed)
Patient ID: Carol Vargas, female   DOB: September 07, 1975, 41 y.o.   MRN: [id number]    Subjective:  Carol Vargas is a 41 y.o. female now 8 days status post supracervical hysterectomy. No complaints. 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  She's here for drain removal Review of Systems Negative except    Diet:      Bowel movements : normal.  The patient is not having any pain.  Objective:  BP 120/88 (BP Location: Right Arm, Patient Position: Sitting, Cuff Size: Large)   Pulse 85   Ht 5\' 8"  (1.727 m)   Wt 240 lb 3.2 oz (109 kg)   LMP 01/06/2017 (Exact Date)   BMI 36.52 kg/m  General:Well developed, well nourished.  No acute distress. Abdomen: Bowel sounds normal, soft, non-tender. Pelvic Exam: Not indicated, due to pt having abdominal incisions. JP drain removed from abdominal wall    External Genitalia:  Normal.    Vagina: Normal    Cervix: Normal    Uterus: Normal    Adnexa/Bimanual: Normal  Incision(s):   Healing well, no drainage, no erythema, no hernia, no swelling, no dehiscence,     Assessment:  Post-Op 8 days s/p supracervical hysterectomy  Doing well postoperatively.   Plan:  1.Wound care discussed   2. Current medications. 3. Activity restrictions: no bending, stooping, or squatting and no lifting more than 25 pounds 4. return to work: 2-3 weeks. 5. Follow up in 2 weeks.  By signing my name below, I, Margit Banda, attest that this documentation has been prepared under the direction and in the presence of Jonnie Kind, MD. Electronically Signed: Margit Banda, Medical Scribe. 02/06/17. 10:31 AM.  I personally performed the services described in this documentation, which was SCRIBED in my presence. The recorded information has been reviewed and considered accurate. It has been edited as necessary during review. Jonnie Kind, MD   JP drain removal

## 2017-02-20 ENCOUNTER — Encounter: Payer: Self-pay | Admitting: Obstetrics and Gynecology

## 2017-02-20 ENCOUNTER — Ambulatory Visit (INDEPENDENT_AMBULATORY_CARE_PROVIDER_SITE_OTHER): Payer: Medicaid Other | Admitting: Obstetrics and Gynecology

## 2017-02-20 VITALS — BP 110/70 | HR 74 | Wt 239.2 lb

## 2017-02-20 DIAGNOSIS — Z09 Encounter for follow-up examination after completed treatment for conditions other than malignant neoplasm: Secondary | ICD-10-CM

## 2017-02-20 DIAGNOSIS — Z9889 Other specified postprocedural states: Secondary | ICD-10-CM

## 2017-02-20 NOTE — Progress Notes (Signed)
Patient ID: Carol Vargas, female   DOB: 1976/06/23, 41 y.o.   MRN: [id number]    Subjective:  Carol Vargas is a 41 y.o. female now 3 weeks status post Abdominal Supracervical Hysterectomy.With wide excision of her old abdominal scar cheese.. With results she is ambulating, being quite active and wants to return to work at [employer] Denies any other sx.    Review of Systems Negative except    Diet:      Bowel movements : normal.  The patient is not having any pain.  Objective:  BP 110/70   Pulse 74   Wt 239 lb 3.2 oz (108.5 kg)   LMP 01/06/2017 (Exact Date)   BMI 36.37 kg/m  General:Well developed, well nourished.  No acute distress. Abdomen: Bowel sounds normal, soft, non-tender. Pelvic Exam: Not required, see prior exam  Incision(s):   Healing well, no drainage, no erythema, no hernia, no swelling, no dehiscence,     Assessment:  Post-Op 3 weeks s/p Abdominal Supracervical Hysterectomy   Healing well postoperatively.   Plan:  1.Wound care discussed   2. Current medications. 3. Activity restrictions: none 4. return to work: now. 5. Follow up in 1 year or PRN.  By signing my name below, I, Margit Banda, attest that this documentation has been prepared under the direction and in the presence of Jonnie Kind, MD. Electronically Signed: Margit Banda, Medical Scribe. 02/20/17. 3:08 PM.  I personally performed the services described in this documentation, which was SCRIBED in my presence. The recorded information has been reviewed and considered accurate. It has been edited as necessary during review. Jonnie Kind, MD

## 2017-03-08 ENCOUNTER — Ambulatory Visit (INDEPENDENT_AMBULATORY_CARE_PROVIDER_SITE_OTHER): Payer: Medicaid Other | Admitting: Family

## 2017-03-08 ENCOUNTER — Encounter: Payer: Self-pay | Admitting: Family

## 2017-03-08 VITALS — BP 126/89 | HR 75 | Temp 97.3°F | Ht 68.0 in | Wt 239.0 lb

## 2017-03-08 DIAGNOSIS — Z713 Dietary counseling and surveillance: Secondary | ICD-10-CM | POA: Diagnosis not present

## 2017-03-08 MED ORDER — BUPROPION HCL ER (SR) 150 MG PO TB12: 150.0000 mg | ORAL_TABLET | Freq: Two times a day (BID) | ORAL | 1 refills | Status: DC

## 2017-03-08 NOTE — Progress Notes (Signed)
   Subjective:    Patient ID: Carol Vargas, female    DOB: [birth year], 41 y.o.   MRN: [id number]  HPI Pt presents to the office today to discuss weight loss medication. Pt states she took phentermine for 6 months and lost 30 lbs. Pt has not taken any medication in over 2-3 months.   Pt would like to try something    Review of Systems  All other systems reviewed and are negative.      Objective:   Physical Exam  Constitutional: She is oriented to person, place, and time. She appears well-developed and well-nourished. No distress.  obese  HENT:  Head: Normocephalic.  Eyes: Pupils are equal, round, and reactive to light.  Neck: Normal range of motion. Neck supple. No thyromegaly present.  Cardiovascular: Normal rate, regular rhythm, normal heart sounds and intact distal pulses.   No murmur heard. Pulmonary/Chest: Effort normal and breath sounds normal. No respiratory distress. She has no wheezes.  Abdominal: Soft. Bowel sounds are normal. She exhibits no distension. There is no tenderness. There is no rebound.  Musculoskeletal: Normal range of motion. She exhibits no edema or tenderness.  Neurological: She is alert and oriented to person, place, and time.  Skin: Skin is warm and dry.  Psychiatric: She has a normal mood and affect. Her behavior is normal. Judgment and thought content normal.  Vitals reviewed.     BP 126/89   Pulse 75   Temp (!) 97.3 F (36.3 C) (Oral)   Ht 5\' 8"  (1.727 m)   Wt 239 lb (108.4 kg)   LMP 01/06/2017 (Exact Date)   BMI 36.34 kg/m      Assessment & Plan:  1. Weight loss counseling, encounter for - buPROPion (WELLBUTRIN SR) 150 MG 12 hr tablet; Take 1 tablet (150 mg total) by mouth 2 (two) times daily.  Dispense: 180 tablet; Refill: 1  2. Morbid obesity (HCC) - buPROPion (WELLBUTRIN SR) 150 MG 12 hr tablet; Take 1 tablet (150 mg total) by mouth 2 (two) times daily.  Dispense: 180 tablet; Refill: 1   Encourage weight loss and exercise Will  start Wellbutrin SR 150 mg  Pt requesting we try something different than phentermine, but if the Wellbutrin does not work we may try the phentermine again RTO in 3 months   Evelina Dun, FNP

## 2017-03-08 NOTE — Patient Instructions (Signed)
Exercising to Lose Weight Exercising can help you to lose weight. In order to lose weight through exercise, you need to do vigorous-intensity exercise. You can tell that you are exercising with vigorous intensity if you are breathing very hard and fast and cannot hold a conversation while exercising. Moderate-intensity exercise helps to maintain your current weight. You can tell that you are exercising at a moderate level if you have a higher heart rate and faster breathing, but you are still able to hold a conversation. How often should I exercise? Choose an activity that you enjoy and set realistic goals. Your health care provider can help you to make an activity plan that works for you. Exercise regularly as directed by your health care provider. This may include:  Doing resistance training twice each week, such as: ? Push-ups. ? Sit-ups. ? Lifting weights. ? Using resistance bands.  Doing a given intensity of exercise for a given amount of time. Choose from these options: ? 150 minutes of moderate-intensity exercise every week. ? 75 minutes of vigorous-intensity exercise every week. ? A mix of moderate-intensity and vigorous-intensity exercise every week.  Children, pregnant women, people who are out of shape, people who are overweight, and older adults may need to consult a health care provider for individual recommendations. If you have any sort of medical condition, be sure to consult your health care provider before starting a new exercise program. What are some activities that can help me to lose weight?  Walking at a rate of at least 4.5 miles an hour.  Jogging or running at a rate of 5 miles per hour.  Biking at a rate of at least 10 miles per hour.  Lap swimming.  Roller-skating or in-line skating.  Cross-country skiing.  Vigorous competitive sports, such as football, basketball, and soccer.  Jumping rope.  Aerobic dancing. How can I be more active in my day-to-day  activities?  Use the stairs instead of the elevator.  Take a walk during your lunch break.  If you drive, park your car farther away from work or school.  If you take public transportation, get off one stop early and walk the rest of the way.  Make all of your phone calls while standing up and walking around.  Get up, stretch, and walk around every 30 minutes throughout the day. What guidelines should I follow while exercising?  Do not exercise so much that you hurt yourself, feel dizzy, or get very short of breath.  Consult your health care provider prior to starting a new exercise program.  Wear comfortable clothes and shoes with good support.  Drink plenty of water while you exercise to prevent dehydration or heat stroke. Body water is lost during exercise and must be replaced.  Work out until you breathe faster and your heart beats faster. This information is not intended to replace advice given to you by your health care provider. Make sure you discuss any questions you have with your health care provider. Document Released: 07/28/2010 Document Revised: 12/01/2015 Document Reviewed: 11/26/2013 Elsevier Interactive Patient Education  2018 [employer]  

## 2017-03-27 ENCOUNTER — Encounter: Payer: Self-pay | Admitting: Family Medicine

## 2017-03-27 ENCOUNTER — Ambulatory Visit (INDEPENDENT_AMBULATORY_CARE_PROVIDER_SITE_OTHER): Payer: Medicaid Other | Admitting: Family Medicine

## 2017-03-27 ENCOUNTER — Telehealth: Payer: Self-pay | Admitting: Family Medicine

## 2017-03-27 VITALS — BP 128/84 | HR 73 | Temp 96.7°F | Ht 68.0 in | Wt 242.0 lb

## 2017-03-27 DIAGNOSIS — M542 Cervicalgia: Secondary | ICD-10-CM | POA: Diagnosis not present

## 2017-03-27 MED ORDER — DICLOFENAC SODIUM 75 MG PO TBEC: 75.0000 mg | DELAYED_RELEASE_TABLET | Freq: Two times a day (BID) | ORAL | 0 refills | Status: DC

## 2017-03-27 MED ORDER — CYCLOBENZAPRINE HCL 10 MG PO TABS: 10.0000 mg | ORAL_TABLET | Freq: Three times a day (TID) | ORAL | 0 refills | Status: DC | PRN

## 2017-03-27 MED ORDER — MELOXICAM 15 MG PO TABS: 15.0000 mg | ORAL_TABLET | Freq: Every day | ORAL | 0 refills | Status: DC

## 2017-03-27 NOTE — Telephone Encounter (Signed)
Left message- new rx has been sent to the pharmacy.

## 2017-03-27 NOTE — Telephone Encounter (Signed)
I sent in the requested prescription 

## 2017-03-27 NOTE — Progress Notes (Signed)
Chief Complaint  Patient presents with  . Neck Pain    pt here today c/o neck stiffness that is causing a headache and tylenol isn't working.    HPI  Patient presents today for 2-3 days of increasing stiffness and pain in the posterior neck. Headache radiates from the neck forward. 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 Woke up the next morning with the pain. It has increased since that time.  PMH: Smoking status noted ROS: Per HPI  Objective: BP 128/84   Pulse 73   Temp (!) 96.7 F (35.9 C) (Oral)   Ht 5\' 8"  (1.727 m)   Wt 242 lb (109.8 kg)   LMP 01/06/2017 (Exact Date)   BMI 36.80 kg/m  Gen: NAD, alert, cooperative with exam HEENT: NCAT, EOMI, PERRL Resp: CTABL, no wheezes, non-labored Ext: No edema, warmTender spasm at the superior trapezius border and cervicalis group muscles Neuro: Alert and oriented, No gross deficits  Assessment and plan:  1. Cervicalgia     Meds ordered this encounter  Medications  . DISCONTD: diclofenac (VOLTAREN) 75 MG EC tablet    Sig: Take 1 tablet (75 mg total) by mouth 2 (two) times daily. For muscle and  Joint pain    Dispense:  60 tablet    Refill:  0  . cyclobenzaprine (FLEXERIL) 10 MG tablet    Sig: Take 1 tablet (10 mg total) by mouth 3 (three) times daily as needed for muscle spasms.    Dispense:  90 tablet    Refill:  0  . meloxicam (MOBIC) 15 MG tablet    Sig: Take 1 tablet (15 mg total) by mouth daily. For joint and muscle pain    Dispense:  30 tablet    Refill:  0    No orders of the defined types were placed in this encounter.   Follow up as needed.  Claretta Fraise, MD

## 2017-04-23 ENCOUNTER — Telehealth: Payer: Self-pay | Admitting: Family

## 2017-04-24 NOTE — Telephone Encounter (Signed)
Pt requesting Phentermine refill Please advise

## 2017-04-25 MED ORDER — PHENTERMINE HCL 37.5 MG PO TABS: 37.5000 mg | ORAL_TABLET | Freq: Every day | ORAL | 2 refills | Status: DC

## 2017-04-25 NOTE — Telephone Encounter (Signed)
RX ready for pick up 

## 2017-04-25 NOTE — Telephone Encounter (Signed)
Left message , phentermine script ready.

## 2017-06-26 ENCOUNTER — Other Ambulatory Visit: Payer: Self-pay | Admitting: *Deleted

## 2017-06-26 MED ORDER — AMLODIPINE BESYLATE 10 MG PO TABS: 10.0000 mg | ORAL_TABLET | Freq: Every day | ORAL | 0 refills | Status: DC

## 2017-07-03 ENCOUNTER — Other Ambulatory Visit: Payer: Self-pay

## 2017-07-03 NOTE — Telephone Encounter (Signed)
Last lipid 09/27/16

## 2017-07-04 MED ORDER — ATORVASTATIN CALCIUM 20 MG PO TABS: 20.0000 mg | ORAL_TABLET | Freq: Every day | ORAL | 0 refills | Status: DC

## 2017-07-04 NOTE — Telephone Encounter (Signed)
Last refill without being seen 

## 2017-09-02 ENCOUNTER — Other Ambulatory Visit: Payer: Self-pay | Admitting: *Deleted

## 2017-09-03 ENCOUNTER — Other Ambulatory Visit: Payer: Self-pay | Admitting: Family

## 2017-09-03 MED ORDER — LOSARTAN POTASSIUM-HCTZ 100-25 MG PO TABS: 1.0000 | ORAL_TABLET | Freq: Every day | ORAL | 0 refills | Status: DC

## 2017-09-03 NOTE — Telephone Encounter (Signed)
Refill sent to [facility] made for 09/23/17

## 2017-09-23 ENCOUNTER — Ambulatory Visit: Payer: Medicaid Other | Admitting: Family

## 2017-09-26 ENCOUNTER — Ambulatory Visit: Payer: Medicaid Other | Admitting: Family

## 2017-10-08 ENCOUNTER — Ambulatory Visit: Payer: Medicaid Other | Admitting: Family

## 2017-10-22 ENCOUNTER — Other Ambulatory Visit: Payer: Self-pay | Admitting: *Deleted

## 2017-10-22 MED ORDER — LOSARTAN POTASSIUM-HCTZ 100-25 MG PO TABS: 1.0000 | ORAL_TABLET | Freq: Every day | ORAL | 0 refills | Status: DC

## 2017-10-23 ENCOUNTER — Encounter: Payer: Self-pay | Admitting: Family Medicine

## 2017-10-23 ENCOUNTER — Ambulatory Visit: Payer: Medicaid Other | Admitting: Family Medicine

## 2017-10-23 VITALS — BP 144/102 | HR 76 | Temp 98.2°F | Ht 68.0 in | Wt 260.0 lb

## 2017-10-23 DIAGNOSIS — J101 Influenza due to other identified influenza virus with other respiratory manifestations: Secondary | ICD-10-CM

## 2017-10-23 LAB — VERITOR FLU A/B WAIVED
Influenza A: POSITIVE — AB
Influenza B: NEGATIVE

## 2017-10-23 MED ORDER — OSELTAMIVIR PHOSPHATE 75 MG PO CAPS: 75.0000 mg | ORAL_CAPSULE | Freq: Two times a day (BID) | ORAL | 0 refills | Status: AC

## 2017-10-23 MED ORDER — GUAIFENESIN-CODEINE 100-10 MG/5ML PO SOLN: 5.0000 mL | Freq: Four times a day (QID) | ORAL | 0 refills | Status: DC | PRN

## 2017-10-23 NOTE — Patient Instructions (Addendum)
You have influenza A.  I have prescribed you Tamiflu to take twice a day for the next 5 days.  I have also sent you in a cough medication to use as needed as directed for severe cough.  You do not need to use Mucinex in addition to this as it already has a decongestant in it.  You may continue Tylenol and ibuprofen though.  Push plenty of fluids.  Follow-up if symptoms are worsening.   Influenza, Adult Influenza ("the flu") is an infection in the lungs, nose, and throat (respiratory tract). It is caused by a virus. The flu causes many common cold symptoms, as well as a high fever and body aches. It can make you feel very sick. The flu spreads easily from person to person (is contagious). Getting a flu shot (influenza vaccination) every year is the best way to prevent the flu. Follow these instructions at home:  Take over-the-counter and prescription medicines only as told by your doctor.  Use a cool mist humidifier to add moisture (humidity) to the air in your home. This can make it easier to breathe.  Rest as needed.  Drink enough fluid to keep your pee (urine) clear or pale yellow.  Cover your mouth and nose when you cough or sneeze.  Wash your hands with soap and water often, especially after you cough or sneeze. If you cannot use soap and water, use hand sanitizer.  Stay home from work or school as told by your doctor. Unless you are visiting your doctor, try to avoid leaving home until your fever has been gone for 24 hours without the use of medicine.  Keep all follow-up visits as told by your doctor. This is important. How is this prevented?  Getting a yearly (annual) flu shot is the best way to avoid getting the flu. You may get the flu shot in late summer, fall, or winter. Ask your doctor when you should get your flu shot.  Wash your hands often or use hand sanitizer often.  Avoid contact with people who are sick during cold and flu season.  Eat healthy foods.  Drink plenty  of fluids.  Get enough sleep.  Exercise regularly. Contact a doctor if:  You get new symptoms.  You have: ? Chest pain. ? Watery poop (diarrhea). ? A fever.  Your cough gets worse.  You start to have more mucus.  You feel sick to your stomach (nauseous).  You throw up (vomit). Get help right away if:  You start to be short of breath or have trouble breathing.  Your skin or nails turn a bluish color.  You have very bad pain or stiffness in your neck.  You get a sudden headache.  You get sudden pain in your face or ear.  You cannot stop throwing up. This information is not intended to replace advice given to you by your health care provider. Make sure you discuss any questions you have with your health care provider. Document Released: 04/03/2008 Document Revised: 12/01/2015 Document Reviewed: 04/19/2015 Elsevier Interactive Patient Education  2017 [employer].

## 2017-10-23 NOTE — Progress Notes (Signed)
Subjective: CC: URI PCP: Sharion Balloon, FNP WVP:[id number] Momon is a 42 y.o. female presenting to clinic today for:  1. URI symptoms  Patient reports productive cough, head/ sinus congestion, nasal stuffiness, right ear pain, body aches and sore throat that started a 1 day ago.  She reports myaglia and fever to 101F yesterday.  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  She has had a her influenza shot this year.  Denies hemoptysis, SOB, dizziness, rash, nausea, vomiting, recent travel.  Patient has used Tylenol/ Motrin, Zyrtec with some relief of body aches but other symptoms are persistent.  No history of COPD or asthma.  No tobacco use/ exposure.   ROS: Per HPI  Allergies  Allergen Reactions  . Lisinopril Cough   Past Medical History:  Diagnosis Date  . Arthritis   . Fibroids   . Headache(784.0)    at times, nothing severe per pt.menstral  . Hypertension     Current Outpatient Medications:  .  acetaminophen (TYLENOL) 500 MG tablet, Take 1,000 mg by mouth daily as needed for mild pain or headache. , Disp: , Rfl:  .  aluminum chloride (DRYSOL) 20 % external solution, Apply topically at bedtime., Disp: 35 mL, Rfl: 0 .  amLODipine (NORVASC) 10 MG tablet, Take 1 tablet (10 mg total) by mouth at bedtime., Disp: 90 tablet, Rfl: 0 .  atorvastatin (LIPITOR) 20 MG tablet, Take 1 tablet (20 mg total) by mouth daily., Disp: 90 tablet, Rfl: 0 .  Iron-Vitamins (GERITOL) LIQD, Take 15 mLs by mouth daily., Disp: , Rfl:  .  losartan-hydrochlorothiazide (HYZAAR) 100-25 MG tablet, Take 1 tablet by mouth daily., Disp: 30 tablet, Rfl: 0 .  meloxicam (MOBIC) 15 MG tablet, Take 1 tablet (15 mg total) by mouth daily. For joint and muscle pain, Disp: 30 tablet, Rfl: 0 .  Turmeric 500 MG TABS, Take 500 mg by mouth every evening., Disp: , Rfl:  .  guaiFENesin-codeine 100-10 MG/5ML syrup, Take 5 mLs by mouth every 6 (six) hours as needed for cough.,  Disp: 100 mL, Rfl: 0 Social History   Socioeconomic History  . Marital status: Divorced    Spouse name: Not on file  . Number of children: Not on file  . Years of education: Not on file  . Highest education level: Not on file  Occupational History  . Not on file  Social Needs  . Financial resource strain: Not on file  . Food insecurity:    Worry: Not on file    Inability: Not on file  . Transportation needs:    Medical: Not on file    Non-medical: Not on file  Tobacco Use  . Smoking status: Former Smoker    Packs/day: 0.50    Years: 5.00    Pack years: 2.50    Types: Cigarettes    Last attempt to quit: 02/06/2013    Years since quitting: 4.7  . Smokeless tobacco: Never Used  Substance and Sexual Activity  . Alcohol use: No  . Drug use: No  . Sexual activity: Not Currently    Birth control/protection: Surgical    Comment: hyst  Lifestyle  . Physical activity:    Days per week: Not on file    Minutes per session: Not on file  . Stress: Not on file  Relationships  . Social connections:    Talks on phone: Not on file    Gets together: Not on file  Attends religious service: Not on file    Active member of club or organization: Not on file    Attends meetings of clubs or organizations: Not on file    Relationship status: Not on file  . Intimate partner violence:    Fear of current or ex partner: Not on file    Emotionally abused: Not on file    Physically abused: Not on file    Forced sexual activity: Not on file  Other Topics Concern  . Not on file  Social History Narrative  . Not on file   Family History  Problem Relation Age of Onset  . Hypertension Mother   . Diabetes Mellitus II Mother   . Osteoporosis Mother   . Fibroids Mother   . Stroke Father        died from this  . Hypertension Father   . Lung cancer Maternal Aunt   . Fibroids Sister     Objective: Office vital signs reviewed. BP (!) 144/102   Pulse 76   Temp 98.2 F (36.8 C) (Oral)    Ht 5\' 8"  (1.727 m)   Wt 260 lb (117.9 kg)   LMP 01/06/2017 (Exact Date)   BMI 39.53 kg/m   Physical Examination:  General: Awake, alert, obese, tired appearing, No acute distress HEENT: Normal    Neck: No masses palpated. No lymphadenopathy    Ears: Tympanic membranes intact, normal light reflex, no erythema, no bulging    Eyes: PERRLA, extraocular membranes intact, sclera white    Nose: nasal turbinates moist, clear nasal discharge    Throat: moist mucus membranes, no erythema, no tonsillar exudate.  Airway is patent Cardio: regular rate and rhythm, S1S2 heard, no murmurs appreciated Pulm: clear to auscultation bilaterally, no wheezes, rhonchi or rales; normal work of breathing on room air; she is coughing intermittently during exam and frequently coughs so hard that she almost vomits.  Assessment/ Plan: 42 y.o. female   1. Influenza A Patient afebrile and nontoxic-appearing but she does look tired on exam.  Given sudden onset of symptoms and fever at home, she was tested for influenza.  This was positive for influenza A.  Because we are within the 24-hour period, we did discuss consideration for Tamiflu.  Patient wished to proceed with Tamiflu, which was prescribed 75 mg p.o. twice daily for the next 5 days.  She was also prescribed Robitussin-AC to use every 6 hours as needed for cough.  Continue ibuprofen and Tylenol as needed.  Rest.  Push oral fluids.  Work note provided excusing through Monday.  Reasons for return and emergent evaluation in the emergency department discussed.  Patient was good understanding will follow-up as needed. - Veritor Flu A/B Waived   Orders Placed This Encounter  Procedures  . Veritor Flu A/B Waived    Order Specific Question:   Source    Answer:   nasal   Meds ordered this encounter  Medications  . guaiFENesin-codeine 100-10 MG/5ML syrup    Sig: Take 5 mLs by mouth every 6 (six) hours as needed for cough.    Dispense:  100 mL    Refill:  0  .  oseltamivir (TAMIFLU) 75 MG capsule    Sig: Take 1 capsule (75 mg total) by mouth 2 (two) times daily for 5 days.    Dispense:  10 capsule    Refill:  0   The Narcotic Database has been reviewed.  There were no red flags.    Additionally, patient's blood  pressure noted to be elevated during exam.  She had not realized that her blood pressure medication was sent in by her PCP yesterday.  She will go to retrieve this as soon as she gets out of this office visit.  Denies chest pain, shortness of breath.  Janora Norlander, DO [facility] [phone]

## 2017-12-06 ENCOUNTER — Ambulatory Visit: Payer: Medicaid Other | Admitting: Family

## 2017-12-10 ENCOUNTER — Encounter: Payer: Self-pay | Admitting: Family

## 2017-12-10 ENCOUNTER — Telehealth: Payer: Self-pay | Admitting: Family

## 2017-12-10 ENCOUNTER — Ambulatory Visit: Payer: Medicaid Other | Admitting: Family

## 2017-12-10 VITALS — BP 157/120 | HR 77 | Temp 98.1°F | Ht 68.0 in | Wt 258.6 lb

## 2017-12-10 DIAGNOSIS — E782 Mixed hyperlipidemia: Secondary | ICD-10-CM

## 2017-12-10 DIAGNOSIS — I10 Essential (primary) hypertension: Secondary | ICD-10-CM

## 2017-12-10 DIAGNOSIS — R5383 Other fatigue: Secondary | ICD-10-CM | POA: Diagnosis not present

## 2017-12-10 DIAGNOSIS — M722 Plantar fascial fibromatosis: Secondary | ICD-10-CM

## 2017-12-10 DIAGNOSIS — E8881 Metabolic syndrome: Secondary | ICD-10-CM

## 2017-12-10 DIAGNOSIS — Z713 Dietary counseling and surveillance: Secondary | ICD-10-CM

## 2017-12-10 DIAGNOSIS — E559 Vitamin D deficiency, unspecified: Secondary | ICD-10-CM | POA: Diagnosis not present

## 2017-12-10 MED ORDER — PHENTERMINE HCL 37.5 MG PO TABS
37.5000 mg | ORAL_TABLET | Freq: Every day | ORAL | 2 refills | Status: DC
Start: 2017-12-10 — End: 2019-04-22

## 2017-12-10 MED ORDER — LOSARTAN POTASSIUM-HCTZ 100-25 MG PO TABS: 1.0000 | ORAL_TABLET | Freq: Every day | ORAL | 3 refills | Status: DC

## 2017-12-10 MED ORDER — PREDNISONE 10 MG (21) PO TBPK: ORAL_TABLET | ORAL | 0 refills | Status: DC

## 2017-12-10 MED ORDER — ATORVASTATIN CALCIUM 20 MG PO TABS: 20.0000 mg | ORAL_TABLET | Freq: Every day | ORAL | 2 refills | Status: DC

## 2017-12-10 MED ORDER — DICLOFENAC SODIUM 75 MG PO TBEC
75.0000 mg | DELAYED_RELEASE_TABLET | Freq: Two times a day (BID) | ORAL | 0 refills | Status: DC
Start: 2017-12-10 — End: 2017-12-27

## 2017-12-10 MED ORDER — AMLODIPINE BESYLATE 10 MG PO TABS: 10.0000 mg | ORAL_TABLET | Freq: Every day | ORAL | 2 refills | Status: DC

## 2017-12-10 NOTE — Patient Instructions (Signed)

## 2017-12-10 NOTE — Telephone Encounter (Signed)
What is the name of the medication? Amlodipine and Losartan Was seen this morning and RX not called in  Have you contacted your pharmacy to request a refill? NO  Which pharmacy would you like this sent to? [facility]   Patient notified that their request is being sent to the clinical staff for review and that they should receive a call once it is complete. If they do not receive a call within 24 hours they can check with their pharmacy or our office.

## 2017-12-10 NOTE — Progress Notes (Signed)
Subjective:    Patient ID: Carol Vargas, female    DOB: 26-Mar-1976, 42 y.o.   MRN: [id number]  Chief Complaint  Patient presents with  . left foot pain  . Hypertension    medication refill,  has been out for over a week    Hypertension  This is a chronic problem. The current episode started more than 1 year ago. The problem has been waxing and waning since onset. The problem is uncontrolled. Associated symptoms include peripheral edema. Pertinent negatives include no headaches. Risk factors for coronary artery disease include obesity. Treatments tried: pt has been out of medication for 1 week. There is no history of kidney disease, CAD/MI, CVA or heart failure.  Foot Injury   The incident occurred 3 to 5 days ago. There was no injury mechanism. The pain is present in the left foot. The quality of the pain is described as aching. The pain is at a severity of 4/10. The pain is moderate. Pertinent negatives include no loss of motion, numbness or tingling. She reports no foreign bodies present. The symptoms are aggravated by weight bearing. She has tried acetaminophen, non-weight bearing and NSAIDs for the symptoms. The treatment provided mild relief.  Hyperlipidemia  This is a chronic problem. The current episode started more than 1 year ago. The problem is uncontrolled. Recent lipid tests were reviewed and are high. Exacerbating diseases include obesity. Current antihyperlipidemic treatment includes diet change. The current treatment provides mild improvement of lipids.  Metabolic Syndrome  PT states she has not been exercising as much because she is fatigued.   Review of Systems  Constitutional: Positive for fatigue.  Neurological: Negative for tingling, numbness and headaches.  All other systems reviewed and are negative.      Objective:   Physical Exam  Constitutional: She is oriented to person, place, and time. She appears well-developed and well-nourished. No distress.  HENT:    Head: Normocephalic and atraumatic.  Right Ear: External ear normal.  Left Ear: External ear normal.  Mouth/Throat: Oropharynx is clear and moist.  Eyes: Pupils are equal, round, and reactive to light.  Neck: Normal range of motion. Neck supple. No thyromegaly present.  Cardiovascular: Normal rate, regular rhythm, normal heart sounds and intact distal pulses.  No murmur heard. Pulmonary/Chest: Effort normal and breath sounds normal. No respiratory distress. She has no wheezes.  Abdominal: Soft. Bowel sounds are normal. She exhibits no distension. There is no tenderness.  Musculoskeletal: Normal range of motion. She exhibits no edema or tenderness.  Neurological: She is alert and oriented to person, place, and time. She has normal reflexes. No cranial nerve deficit.  Skin: Skin is warm and dry.  Psychiatric: She has a normal mood and affect. Her behavior is normal. Judgment and thought content normal.  Vitals reviewed.     BP (!) 157/120   Pulse 77   Temp 98.1 F (36.7 C) (Oral)   Ht '5\' 8"'$  (1.727 m)   Wt 258 lb 9.6 oz (117.3 kg)   LMP 01/06/2017 (Exact Date)   BMI 39.32 kg/m      Assessment & Plan:  Carol Vargas comes in today with chief complaint of left foot pain and Hypertension (medication refill,  has been out for over a week)   Diagnosis and orders addressed:  1. Essential hypertension - CMP14+EGFR  2. Morbid obesity ([place]) - CMP14+EGFR  3. Mixed hyperlipidemia - CMP14+EGFR - Lipid panel  4. Metabolic syndrome - RKY70+WCBJ  5. Plantar fasciitis Rest Ice Good  shoe support - diclofenac (VOLTAREN) 75 MG EC tablet; Take 1 tablet (75 mg total) by mouth 2 (two) times daily.  Dispense: 30 tablet; Refill: 0 - predniSONE (STERAPRED UNI-PAK 21 TAB) 10 MG (21) TBPK tablet; Use as directed  Dispense: 21 tablet; Refill: 0 - CMP14+EGFR  6. Vitamin D deficiency - CMP14+EGFR - VITAMIN D 25 Hydroxy (Vit-D Deficiency, Fractures)    7. Weight loss counseling,  encounter for Encourage healthy eating and exercise - phentermine (ADIPEX-P) 37.5 MG tablet; Take 1 tablet (37.5 mg total) by mouth daily before breakfast.  Dispense: 30 tablet; Refill: 2  8. Fatigue, unspecified type - TSH  Follow up in 3 months   Evelina Dun, FNP

## 2017-12-10 NOTE — Telephone Encounter (Signed)
Prescription sent to pharmacy.

## 2017-12-17 ENCOUNTER — Other Ambulatory Visit: Payer: Self-pay | Admitting: Family

## 2017-12-17 DIAGNOSIS — M722 Plantar fascial fibromatosis: Secondary | ICD-10-CM

## 2017-12-17 MED ORDER — PREDNISONE 10 MG (21) PO TBPK: ORAL_TABLET | ORAL | 0 refills | Status: DC

## 2017-12-17 NOTE — Telephone Encounter (Signed)
I sent in another refill, but can not refill again. Needs to continue NSAID's, rest, and good shoe support.

## 2017-12-20 NOTE — Telephone Encounter (Signed)
Aware. 

## 2017-12-27 ENCOUNTER — Encounter: Payer: Self-pay | Admitting: Physician Assistant

## 2017-12-27 ENCOUNTER — Ambulatory Visit: Payer: Medicaid Other | Admitting: Physician Assistant

## 2017-12-27 VITALS — BP 131/87 | HR 70 | Temp 98.9°F | Ht 68.0 in | Wt 257.0 lb

## 2017-12-27 DIAGNOSIS — M25512 Pain in left shoulder: Secondary | ICD-10-CM | POA: Diagnosis not present

## 2017-12-27 DIAGNOSIS — M722 Plantar fascial fibromatosis: Secondary | ICD-10-CM

## 2017-12-27 DIAGNOSIS — K2100 Gastro-esophageal reflux disease with esophagitis, without bleeding: Secondary | ICD-10-CM

## 2017-12-27 MED ORDER — DICLOFENAC SODIUM 75 MG PO TBEC: 75.0000 mg | DELAYED_RELEASE_TABLET | Freq: Two times a day (BID) | ORAL | 3 refills | Status: DC

## 2017-12-27 MED ORDER — OMEPRAZOLE 20 MG PO CPDR: 20.0000 mg | DELAYED_RELEASE_CAPSULE | Freq: Every day | ORAL | 11 refills | Status: DC

## 2017-12-27 NOTE — Progress Notes (Signed)
BP 131/87   Pulse 70   Temp 98.9 F (37.2 C) (Oral)   Ht 5\' 8"  (1.727 m)   Wt 257 lb (116.6 kg)   LMP 01/06/2017 (Exact Date)   SpO2 100%   BMI 39.08 kg/m     Subjective:    Patient ID: Carol Vargas, female    DOB: 01/18/1976, 42 y.o.   MRN: [id number]  HPI: Carol Vargas is a 42 y.o. female presenting on 12/27/2017 for Chest Pain and Gastroesophageal Reflux  Patient comes in having chest pain on and off with associated indigestion.  She has been taking fairly chronic Voltaren because of plantar fasciitis and her other joint pain.  She states that it helped greatly with the arthritis and therefore she had continued to take it.She states that she has never had long-term problems with only occasionally takes an over-the-counter medicine.  Had a long discussion about the possibility of NSAIDs causing reflux and increased acid.  She is willing to start a medication to reduce her acid.  Past Medical History:  Diagnosis Date  . Arthritis   . Fibroids   . Headache(784.0)    at times, nothing severe per pt.menstral  . Hypertension    Relevant past medical, surgical, family and social history reviewed and updated as indicated. Interim medical history since our last visit reviewed. Allergies and medications reviewed and updated. DATA REVIEWED: CHART IN EPIC  Family History reviewed for pertinent findings.  Review of Systems  Constitutional: Negative.   HENT: Negative.   Eyes: Negative.   Respiratory: Negative.   Cardiovascular: Negative for chest pain, palpitations and leg swelling.  Gastrointestinal: Positive for abdominal pain. Negative for anal bleeding, blood in stool, constipation, rectal pain and vomiting.  Genitourinary: Negative.     Allergies as of 12/27/2017      Reactions   Lisinopril Cough      Medication List        Accurate as of 12/27/17 11:59 PM. Always use your most recent med list.          acetaminophen 500 MG tablet Commonly known as:  TYLENOL Take  1,000 mg by mouth daily as needed for mild pain or headache.   aluminum chloride 20 % external solution Commonly known as:  DRYSOL Apply topically at bedtime.   amLODipine 10 MG tablet Commonly known as:  NORVASC Take 1 tablet (10 mg total) by mouth at bedtime.   atorvastatin 20 MG tablet Commonly known as:  LIPITOR Take 1 tablet (20 mg total) by mouth daily.   diclofenac 75 MG EC tablet Commonly known as:  VOLTAREN Take 1 tablet (75 mg total) by mouth 2 (two) times daily.   Geritol Liqd Take 15 mLs by mouth daily.   losartan-hydrochlorothiazide 100-25 MG tablet Commonly known as:  HYZAAR Take 1 tablet by mouth daily.   omeprazole 20 MG capsule Commonly known as:  PRILOSEC Take 1 capsule (20 mg total) by mouth daily.   phentermine 37.5 MG tablet Commonly known as:  ADIPEX-P Take 1 tablet (37.5 mg total) by mouth daily before breakfast.   predniSONE 10 MG (21) Tbpk tablet Commonly known as:  STERAPRED UNI-PAK 21 TAB Take by mouth daily.   Turmeric 500 MG Tabs Take 500 mg by mouth every evening.          Objective:    BP 131/87   Pulse 70   Temp 98.9 F (37.2 C) (Oral)   Ht 5\' 8"  (1.727 m)   Wt 257 lb (116.6  kg)   LMP 01/06/2017 (Exact Date)   SpO2 100%   BMI 39.08 kg/m    Allergies  Allergen Reactions  . Lisinopril Cough    Wt Readings from Last 3 Encounters:  12/27/17 257 lb (116.6 kg)  12/10/17 258 lb 9.6 oz (117.3 kg)  10/23/17 260 lb (117.9 kg)    Physical Exam  Constitutional: She is oriented to person, place, and time. She appears well-developed and well-nourished.  HENT:  Head: Normocephalic and atraumatic.  Eyes: Pupils are equal, round, and reactive to light. Conjunctivae and EOM are normal.  Cardiovascular: Normal rate, regular rhythm, S1 normal, S2 normal, normal heart sounds and intact distal pulses.  No murmur heard. Pulmonary/Chest: Effort normal and breath sounds normal.  Abdominal: Soft. Bowel sounds are normal. There is  tenderness in the epigastric area. There is no rigidity, no rebound and no guarding.    Neurological: She is alert and oriented to person, place, and time. She has normal reflexes.  Skin: Skin is warm and dry. No rash noted.  Psychiatric: She has a normal mood and affect. Her behavior is normal. Judgment and thought content normal.        Assessment & Plan:   1. Plantar fasciitis - diclofenac (VOLTAREN) 75 MG EC tablet; Take 1 tablet (75 mg total) by mouth 2 (two) times daily.  Dispense: 30 tablet; Refill: 3  2. Gastroesophageal reflux disease with esophagitis - omeprazole (PRILOSEC) 20 MG capsule; Take 1 capsule (20 mg total) by mouth daily.  Dispense: 30 capsule; Refill: 11  3. Acute pain of left shoulder - diclofenac (VOLTAREN) 75 MG EC tablet; Take 1 tablet (75 mg total) by mouth 2 (two) times daily.  Dispense: 30 tablet; Refill: 3   Continue all other maintenance medications as listed above.  Follow up plan: Return in about 1 month (around 01/24/2018) for can recheck with PCP.  Educational handout given for [place] PA-C [place] [address]  [place], [place] [address] [phone]   12/28/2017, 8:18 AM

## 2017-12-27 NOTE — Patient Instructions (Signed)
Shoulder Exercises Ask your health care provider which exercises are safe for you. Do exercises exactly as told by your health care provider and adjust them as directed. It is normal to feel mild stretching, pulling, tightness, or discomfort as you do these exercises, but you should stop right away if you feel sudden pain or your pain gets worse.Do not begin these exercises until told by your health care provider. RANGE OF MOTION EXERCISES These exercises warm up your muscles and joints and improve the movement and flexibility of your shoulder. These exercises also help to relieve pain, numbness, and tingling. These exercises involve stretching your injured shoulder directly. Exercise A: Pendulum  1. Stand near a wall or a surface that you can hold onto for balance. 2. Bend at the waist and let your left / right arm hang straight down. Use your other arm to support you. Keep your back straight and do not lock your knees. 3. Relax your left / right arm and shoulder muscles, and move your hips and your trunk so your left / right arm swings freely. Your arm should swing because of the motion of your body, not because you are using your arm or shoulder muscles. 4. Keep moving your body so your arm swings in the following directions, as told by your health care provider: ? Side to side. ? Forward and backward. ? In clockwise and counterclockwise circles. 5. Continue each motion for __________ seconds, or for as long as told by your health care provider. 6. Slowly return to the starting position. Repeat __________ times. Complete this exercise __________ times a day. Exercise B:Flexion, Standing  1. Stand and hold a broomstick, a cane, or a similar object. Place your hands a little more than shoulder-width apart on the object. Your left / right hand should be palm-up, and your other hand should be palm-down. 2. Keep your elbow straight and keep your shoulder muscles relaxed. Push the stick down with  your healthy arm to raise your left / right arm in front of your body, and then over your head until you feel a stretch in your shoulder. ? Avoid shrugging your shoulder while you raise your arm. Keep your shoulder blade tucked down toward the middle of your back. 3. Hold for __________ seconds. 4. Slowly return to the starting position. Repeat __________ times. Complete this exercise __________ times a day. Exercise C: Abduction, Standing 1. Stand and hold a broomstick, a cane, or a similar object. Place your hands a little more than shoulder-width apart on the object. Your left / right hand should be palm-up, and your other hand should be palm-down. 2. While keeping your elbow straight and your shoulder muscles relaxed, push the stick across your body toward your left / right side. Raise your left / right arm to the side of your body and then over your head until you feel a stretch in your shoulder. ? Do not raise your arm above shoulder height, unless your health care provider tells you to do that. ? Avoid shrugging your shoulder while you raise your arm. Keep your shoulder blade tucked down toward the middle of your back. 3. Hold for __________ seconds. 4. Slowly return to the starting position. Repeat __________ times. Complete this exercise __________ times a day. Exercise D:Internal Rotation  1. Place your left / right hand behind your back, palm-up. 2. Use your other hand to dangle an exercise band, a towel, or a similar object over your shoulder. Grasp the band with   your left / right hand so you are holding onto both ends. 3. Gently pull up on the band until you feel a stretch in the front of your left / right shoulder. ? Avoid shrugging your shoulder while you raise your arm. Keep your shoulder blade tucked down toward the middle of your back. 4. Hold for __________ seconds. 5. Release the stretch by letting go of the band and lowering your hands. Repeat __________ times. Complete  this exercise __________ times a day. STRETCHING EXERCISES These exercises warm up your muscles and joints and improve the movement and flexibility of your shoulder. These exercises also help to relieve pain, numbness, and tingling. These exercises are done using your healthy shoulder to help stretch the muscles of your injured shoulder. Exercise E: Corner Stretch (External Rotation and Abduction)  1. Stand in a doorway with one of your feet slightly in front of the other. This is called a staggered stance. If you cannot reach your forearms to the door frame, stand facing a corner of a room. 2. Choose one of the following positions as told by your health care provider: ? Place your hands and forearms on the door frame above your head. ? Place your hands and forearms on the door frame at the height of your head. ? Place your hands on the door frame at the height of your elbows. 3. Slowly move your weight onto your front foot until you feel a stretch across your chest and in the front of your shoulders. Keep your head and chest upright and keep your abdominal muscles tight. 4. Hold for __________ seconds. 5. To release the stretch, shift your weight to your back foot. Repeat __________ times. Complete this stretch __________ times a day. Exercise F:Extension, Standing 1. Stand and hold a broomstick, a cane, or a similar object behind your back. ? Your hands should be a little wider than shoulder-width apart. ? Your palms should face away from your back. 2. Keeping your elbows straight and keeping your shoulder muscles relaxed, move the stick away from your body until you feel a stretch in your shoulder. ? Avoid shrugging your shoulders while you move the stick. Keep your shoulder blade tucked down toward the middle of your back. 3. Hold for __________ seconds. 4. Slowly return to the starting position. Repeat __________ times. Complete this exercise __________ times a day. STRENGTHENING  EXERCISES These exercises build strength and endurance in your shoulder. Endurance is the ability to use your muscles for a long time, even after they get tired. Exercise G:External Rotation  1. Sit in a stable chair without armrests. 2. Secure an exercise band at elbow height on your left / right side. 3. Place a soft object, such as a folded towel or a small pillow, between your left / right upper arm and your body to move your elbow a few inches away (about 10 cm) from your side. 4. Hold the end of the band so it is tight and there is no slack. 5. Keeping your elbow pressed against the soft object, move your left / right forearm out, away from your abdomen. Keep your body steady so only your forearm moves. 6. Hold for __________ seconds. 7. Slowly return to the starting position. Repeat __________ times. Complete this exercise __________ times a day. Exercise H:Shoulder Abduction  1. Sit in a stable chair without armrests, or stand. 2. Hold a __________ weight in your left / right hand, or hold an exercise band with both hands.   3. Start with your arms straight down and your left / right palm facing in, toward your body. 4. Slowly lift your left / right hand out to your side. Do not lift your hand above shoulder height unless your health care provider tells you that this is safe. ? Keep your arms straight. ? Avoid shrugging your shoulder while you do this movement. Keep your shoulder blade tucked down toward the middle of your back. 5. Hold for __________ seconds. 6. Slowly lower your arm, and return to the starting position. Repeat __________ times. Complete this exercise __________ times a day. Exercise I:Shoulder Extension 1. Sit in a stable chair without armrests, or stand. 2. Secure an exercise band to a stable object in front of you where it is at shoulder height. 3. Hold one end of the exercise band in each hand. Your palms should face each other. 4. Straighten your elbows and  lift your hands up to shoulder height. 5. Step back, away from the secured end of the exercise band, until the band is tight and there is no slack. 6. Squeeze your shoulder blades together as you pull your hands down to the sides of your thighs. Stop when your hands are straight down by your sides. Do not let your hands go behind your body. 7. Hold for __________ seconds. 8. Slowly return to the starting position. Repeat __________ times. Complete this exercise __________ times a day. Exercise J:Standing Shoulder Row 1. Sit in a stable chair without armrests, or stand. 2. Secure an exercise band to a stable object in front of you so it is at waist height. 3. Hold one end of the exercise band in each hand. Your palms should be in a thumbs-up position. 4. Bend each of your elbows to an "L" shape (about 90 degrees) and keep your upper arms at your sides. 5. Step back until the band is tight and there is no slack. 6. Slowly pull your elbows back behind you. 7. Hold for __________ seconds. 8. Slowly return to the starting position. Repeat __________ times. Complete this exercise __________ times a day. Exercise K:Shoulder Press-Ups  1. Sit in a stable chair that has armrests. Sit upright, with your feet flat on the floor. 2. Put your hands on the armrests so your elbows are bent and your fingers are pointing forward. Your hands should be about even with the sides of your body. 3. Push down on the armrests and use your arms to lift yourself off of the chair. Straighten your elbows and lift yourself up as much as you comfortably can. ? Move your shoulder blades down, and avoid letting your shoulders move up toward your ears. ? Keep your feet on the ground. As you get stronger, your feet should support less of your body weight as you lift yourself up. 4. Hold for __________ seconds. 5. Slowly lower yourself back into the chair. Repeat __________ times. Complete this exercise __________ times a  day. Exercise L: Wall Push-Ups  1. Stand so you are facing a stable wall. Your feet should be about one arm-length away from the wall. 2. Lean forward and place your palms on the wall at shoulder height. 3. Keep your feet flat on the floor as you bend your elbows and lean forward toward the wall. 4. Hold for __________ seconds. 5. Straighten your elbows to push yourself back to the starting position. Repeat __________ times. Complete this exercise __________ times a day. This information is not intended to replace advice   given to you by your health care provider. Make sure you discuss any questions you have with your health care provider. Document Released: 05/09/2005 Document Revised: 03/19/2016 Document Reviewed: 03/06/2015 Elsevier Interactive Patient Education  2018 [employer]  

## 2018-02-03 ENCOUNTER — Ambulatory Visit: Payer: Medicaid Other | Admitting: Nurse Practitioner

## 2018-02-03 ENCOUNTER — Encounter: Payer: Self-pay | Admitting: Nurse Practitioner

## 2018-02-03 ENCOUNTER — Ambulatory Visit (INDEPENDENT_AMBULATORY_CARE_PROVIDER_SITE_OTHER): Payer: Medicaid Other

## 2018-02-03 VITALS — BP 140/89 | HR 68 | Temp 97.8°F | Ht 68.0 in | Wt 258.0 lb

## 2018-02-03 DIAGNOSIS — M25512 Pain in left shoulder: Secondary | ICD-10-CM | POA: Diagnosis not present

## 2018-02-03 DIAGNOSIS — M7552 Bursitis of left shoulder: Secondary | ICD-10-CM | POA: Diagnosis not present

## 2018-02-03 IMAGING — DX DG SHOULDER 2+V*L*
3 series · 3 of 3 positions shown · non-contrast
Comparison: None.

CLINICAL DATA: Left shoulder pain

EXAM:
LEFT SHOULDER - 2+ VIEW

[shoulder ap]
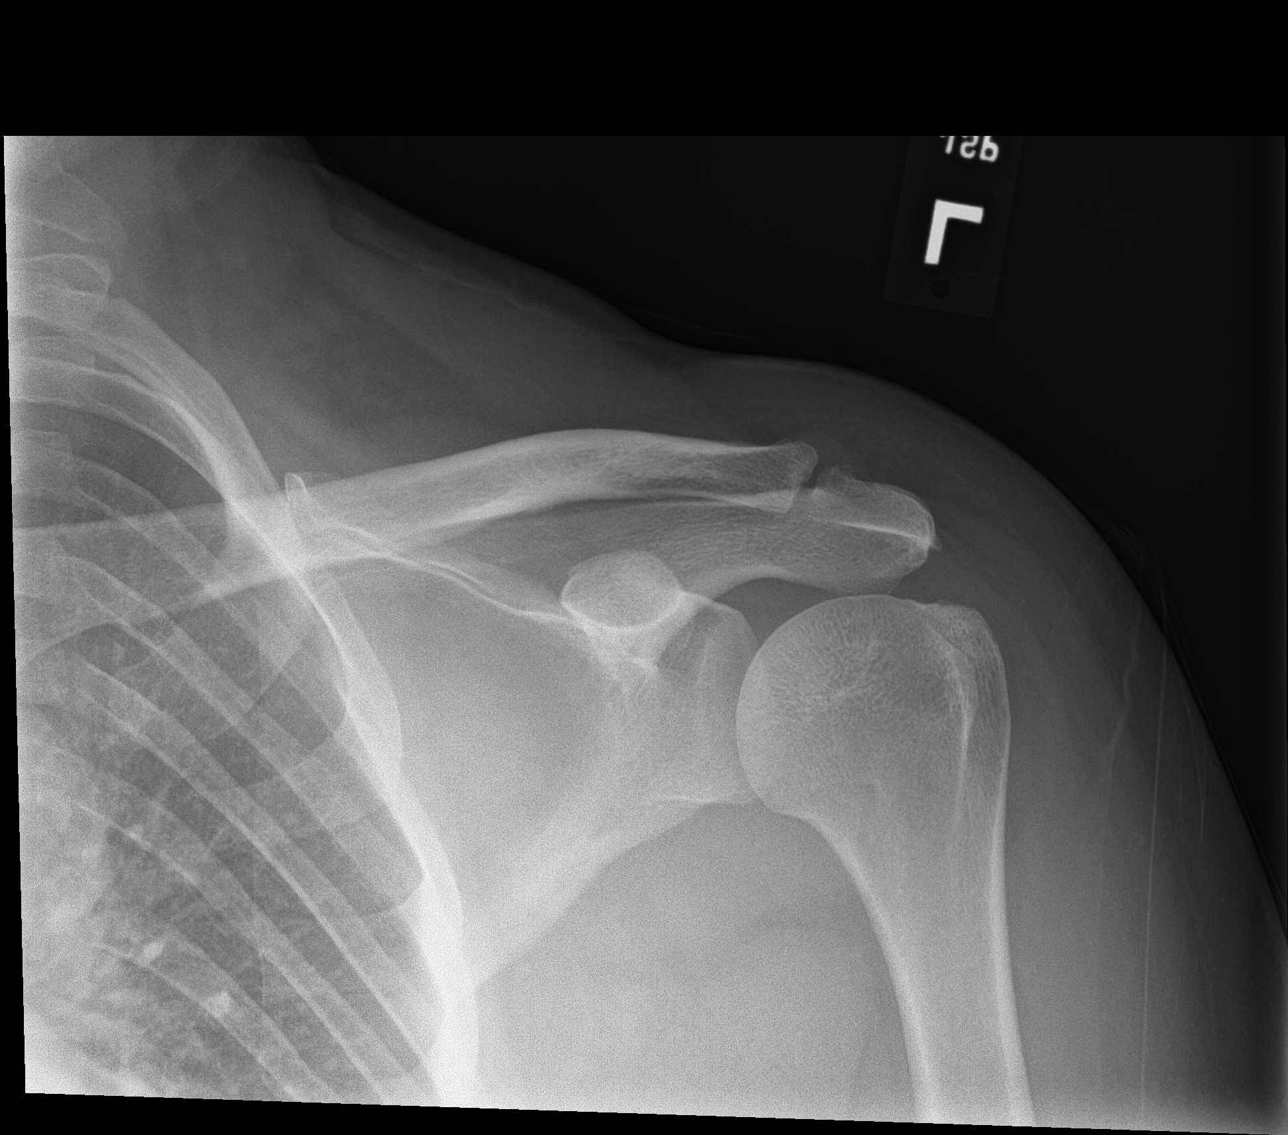

[shoulder obl]
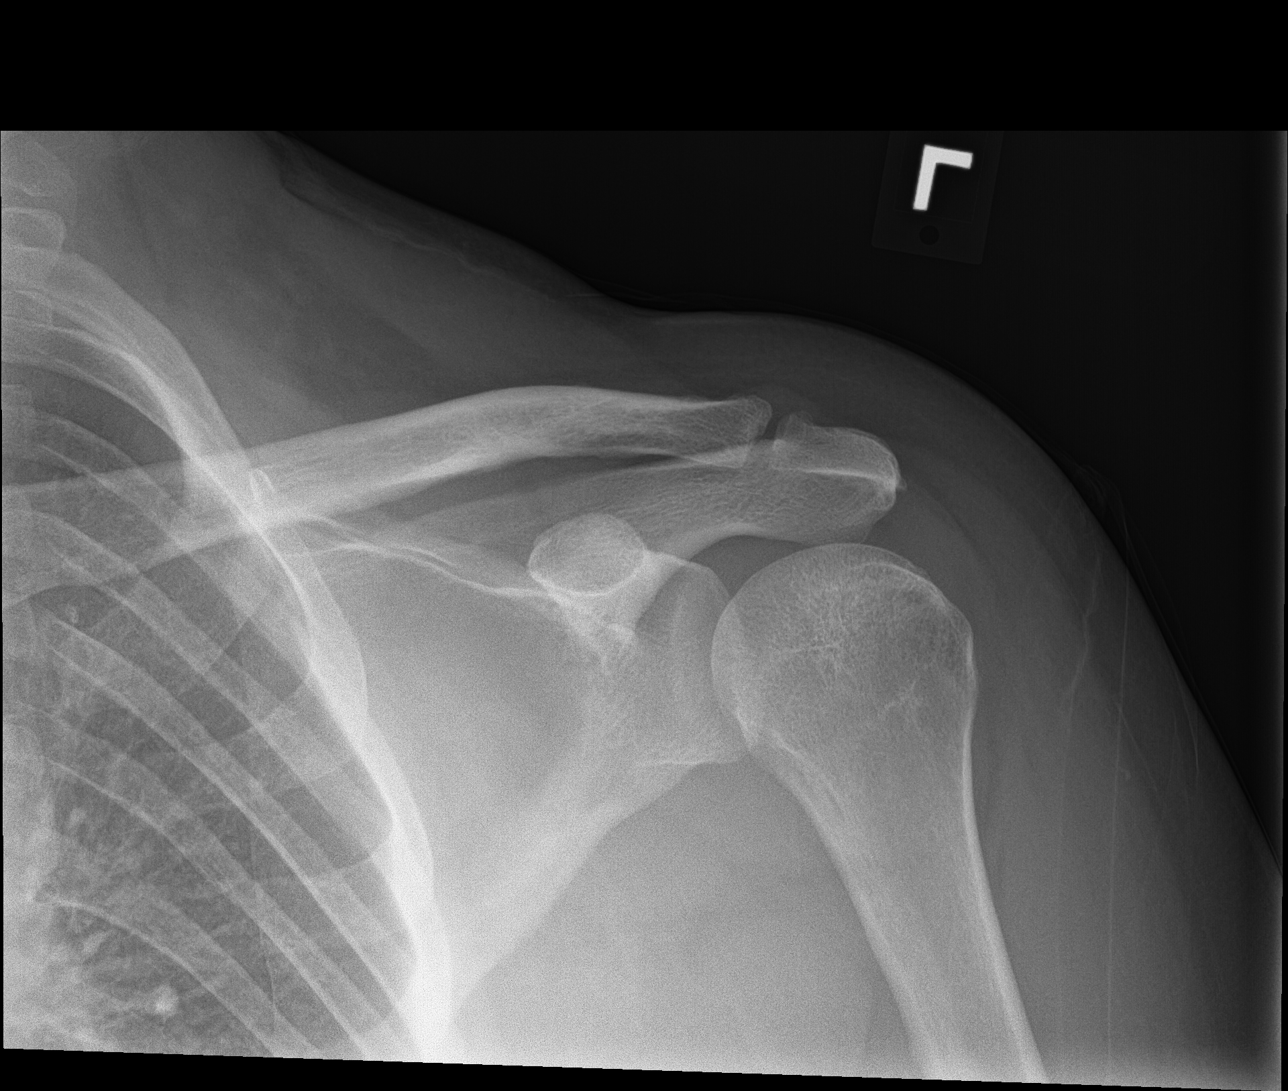

[shoulder axial]
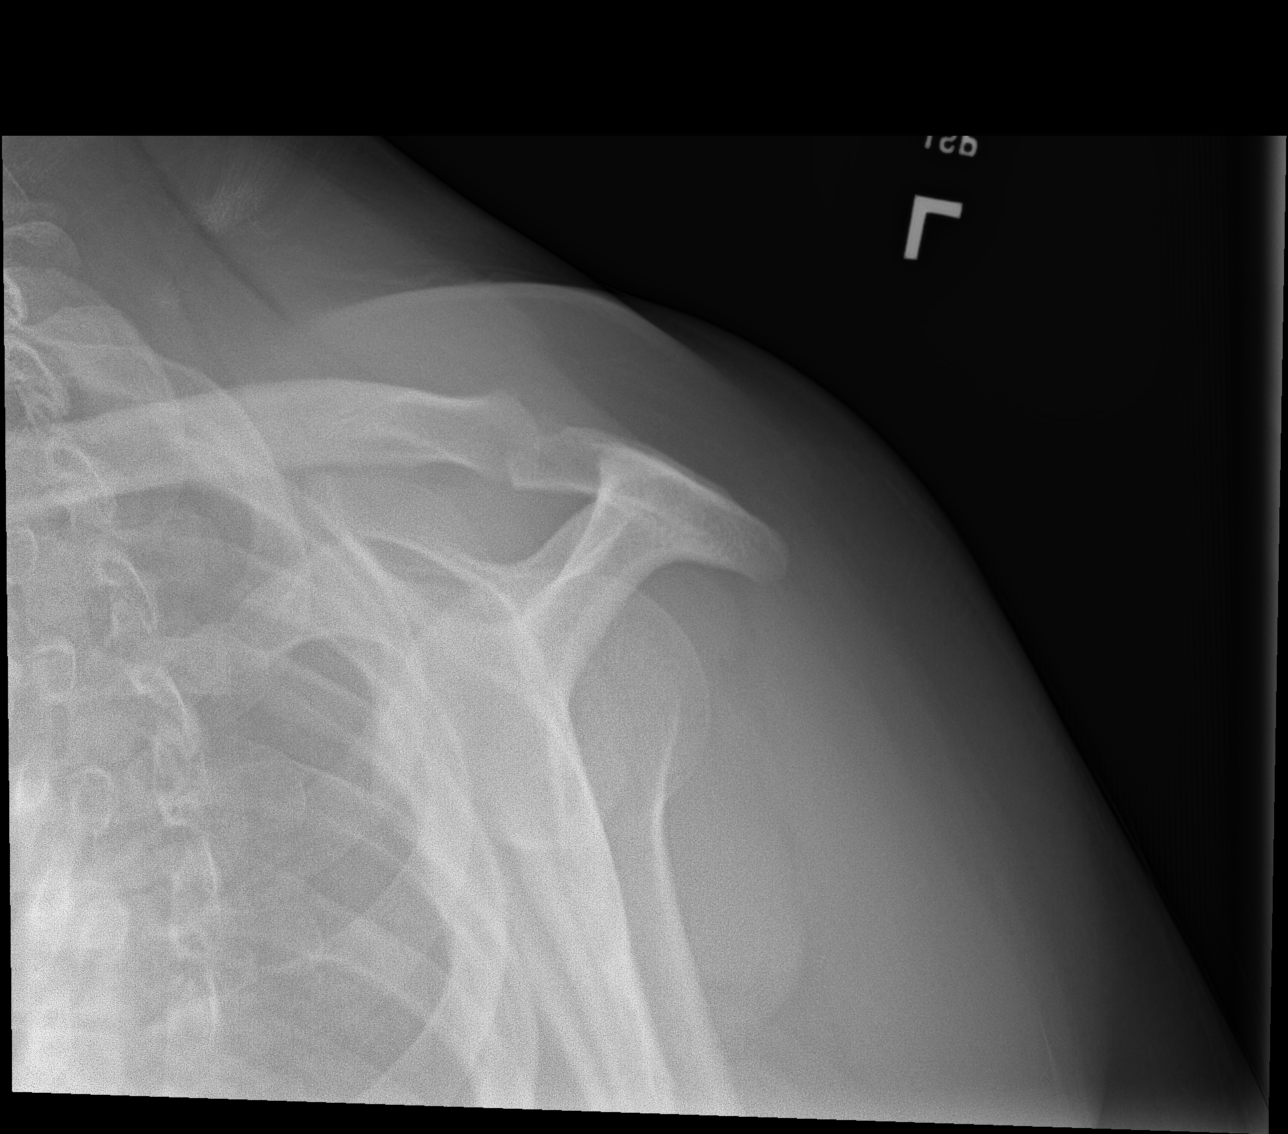

[3 of 3 positions shown; findings below may reference images not displayed]

FINDINGS: Mild degenerative changes of the acromioclavicular joint are noted.
No acute fracture or dislocation is seen. No soft tissue changes are
noted.
IMPRESSION: No acute abnormality noted.

## 2018-02-03 MED ORDER — PREDNISONE 10 MG (21) PO TBPK: ORAL_TABLET | ORAL | 0 refills | Status: DC

## 2018-02-03 NOTE — Progress Notes (Signed)
   Subjective:    Patient ID: Carol Vargas, female    DOB: 27-Oct-1975, 42 y.o.   MRN: [id number]   Chief Complaint: Shoulder Pain (left   No injury)   HPI Patient in today c/o left shoulder pain. started all the sudden a month ago as a dull ache and now it is stiff and painful when she raises it. Describes pain as achy. Rates pain 4/10 currently. movement increases pain. Resting arm decreases pain. She has tied tylenol and ibuprofen OTC.    Review of Systems  Constitutional: Negative.   Respiratory: Negative.   Cardiovascular: Negative.   Musculoskeletal: Positive for arthralgias (left shoulder).  Neurological: Negative.   Psychiatric/Behavioral: Negative.   All other systems reviewed and are negative.      Objective:   Physical Exam  Constitutional: She is oriented to person, place, and time. She appears well-developed and well-nourished. No distress.  Cardiovascular: Normal rate and regular rhythm.  Pulmonary/Chest: Effort normal.  Musculoskeletal:  decreased ROM of left shoulder with pain on internal rotation and abduction. Grips equal bil DTR's intact Motor strength and sensatin distally intact.  Neurological: She is alert and oriented to person, place, and time.  Skin: Skin is warm and dry.  Psychiatric: She has a normal mood and affect. Her behavior is normal. Thought content normal.   BP 140/89   Pulse 68   Temp 97.8 F (36.6 C) (Oral)   Ht 5\' 8"  (1.727 m)   Wt 258 lb (117 kg)   LMP 01/06/2017 (Exact Date)   BMI 39.23 kg/m        Assessment & Plan:  Carol Vargas comes in today with chief complaint of Shoulder Pain (left   No injury)   Diagnosis and orders addressed:  1. Acute pain of left shoulder - DG Shoulder Left; Future  2. Bursitis of left shoulder Ice Rest  RTO prn - predniSONE (STERAPRED UNI-PAK 21 TAB) 10 MG (21) TBPK tablet; As directed x 6 days  Dispense: 21 tablet; Refill: 0   Follow up plan: prn   Mary-Margaret Hassell Done, FNP

## 2018-02-03 NOTE — Patient Instructions (Signed)
Bursitis Bursitis is when the fluid-filled sac (bursa) that covers and protects a joint is swollen (inflamed). Bursitis is most common near joints, especially the knees, elbows, hips, and shoulders. Follow these instructions at home:  Take medicines only as told by your doctor.  If you were prescribed an antibiotic medicine, finish it all even if you start to feel better.  Rest the affected area as told by your doctor. ? Keep the area raised up. ? Avoid doing things that make the pain worse.  Apply ice to the injured area: ? Place ice in a plastic bag. ? Place a towel between your skin and the bag. ? Leave the ice on for 20 minutes, 2-3 times a day.  Use splints, braces, pads, or walking aids as told by your doctor.  Keep all follow-up visits as told by your doctor. This is important. Contact a doctor if:  You have more pain with home care.  You have a fever.  You have chills. This information is not intended to replace advice given to you by your health care provider. Make sure you discuss any questions you have with your health care provider. Document Released: 12/13/2009 Document Revised: 12/01/2015 Document Reviewed: 09/14/2013 Elsevier Interactive Patient Education  2018 [employer]  

## 2018-03-17 ENCOUNTER — Telehealth: Payer: Self-pay

## 2018-03-17 DIAGNOSIS — M25512 Pain in left shoulder: Secondary | ICD-10-CM

## 2018-03-17 NOTE — Telephone Encounter (Signed)
[facility] wants to refer to [facility] but patient has Medicaid so we need to refer her

## 2018-03-18 NOTE — Telephone Encounter (Signed)
Referral placed.

## 2018-03-18 NOTE — Addendum Note (Signed)
Addended by: Evelina Dun A on: 03/18/2018 09:58 AM   Modules accepted: Orders

## 2018-03-24 ENCOUNTER — Encounter: Payer: Self-pay | Admitting: Physical Therapy

## 2018-03-24 ENCOUNTER — Ambulatory Visit: Payer: Medicaid Other | Attending: Family Medicine | Admitting: Physical Therapy

## 2018-03-24 DIAGNOSIS — M25512 Pain in left shoulder: Secondary | ICD-10-CM | POA: Insufficient documentation

## 2018-03-24 DIAGNOSIS — M542 Cervicalgia: Secondary | ICD-10-CM

## 2018-03-24 DIAGNOSIS — M62838 Other muscle spasm: Secondary | ICD-10-CM

## 2018-03-24 DIAGNOSIS — G8929 Other chronic pain: Secondary | ICD-10-CM | POA: Insufficient documentation

## 2018-03-24 DIAGNOSIS — M25612 Stiffness of left shoulder, not elsewhere classified: Secondary | ICD-10-CM | POA: Insufficient documentation

## 2018-03-24 DIAGNOSIS — M6281 Muscle weakness (generalized): Secondary | ICD-10-CM | POA: Diagnosis present
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
## 2018-04-02 ENCOUNTER — Encounter (HOSPITAL_COMMUNITY): Payer: Self-pay

## 2018-04-02 ENCOUNTER — Ambulatory Visit (HOSPITAL_COMMUNITY): Payer: Medicaid Other | Attending: Family

## 2018-04-02 DIAGNOSIS — M25612 Stiffness of left shoulder, not elsewhere classified: Secondary | ICD-10-CM

## 2018-04-02 DIAGNOSIS — M542 Cervicalgia: Secondary | ICD-10-CM | POA: Diagnosis present

## 2018-04-02 DIAGNOSIS — G8929 Other chronic pain: Secondary | ICD-10-CM | POA: Diagnosis present

## 2018-04-02 DIAGNOSIS — M25512 Pain in left shoulder: Secondary | ICD-10-CM | POA: Diagnosis present

## 2018-04-02 DIAGNOSIS — M62838 Other muscle spasm: Secondary | ICD-10-CM

## 2018-04-02 DIAGNOSIS — M6281 Muscle weakness (generalized): Secondary | ICD-10-CM | POA: Diagnosis present
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
## 2018-04-04 ENCOUNTER — Encounter

## 2018-04-08 ENCOUNTER — Encounter (HOSPITAL_COMMUNITY): Payer: Self-pay

## 2018-04-08 ENCOUNTER — Ambulatory Visit (HOSPITAL_COMMUNITY): Payer: Medicaid Other | Attending: Family

## 2018-04-08 DIAGNOSIS — M62838 Other muscle spasm: Secondary | ICD-10-CM | POA: Diagnosis present

## 2018-04-08 DIAGNOSIS — M25612 Stiffness of left shoulder, not elsewhere classified: Secondary | ICD-10-CM | POA: Insufficient documentation

## 2018-04-08 DIAGNOSIS — G8929 Other chronic pain: Secondary | ICD-10-CM | POA: Insufficient documentation

## 2018-04-08 DIAGNOSIS — M542 Cervicalgia: Secondary | ICD-10-CM | POA: Insufficient documentation

## 2018-04-08 DIAGNOSIS — M25512 Pain in left shoulder: Secondary | ICD-10-CM | POA: Diagnosis present

## 2018-04-08 DIAGNOSIS — M6281 Muscle weakness (generalized): Secondary | ICD-10-CM | POA: Insufficient documentation
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
## 2018-04-09 ENCOUNTER — Ambulatory Visit (HOSPITAL_COMMUNITY): Payer: Medicaid Other

## 2018-04-16 ENCOUNTER — Encounter (HOSPITAL_COMMUNITY): Payer: Self-pay

## 2018-04-16 ENCOUNTER — Ambulatory Visit (HOSPITAL_COMMUNITY): Payer: Medicaid Other

## 2018-04-16 ENCOUNTER — Other Ambulatory Visit: Payer: Self-pay

## 2018-04-16 DIAGNOSIS — M25512 Pain in left shoulder: Secondary | ICD-10-CM

## 2018-04-16 DIAGNOSIS — M62838 Other muscle spasm: Secondary | ICD-10-CM

## 2018-04-16 DIAGNOSIS — M542 Cervicalgia: Secondary | ICD-10-CM

## 2018-04-16 DIAGNOSIS — M6281 Muscle weakness (generalized): Secondary | ICD-10-CM

## 2018-04-16 DIAGNOSIS — M25612 Stiffness of left shoulder, not elsewhere classified: Secondary | ICD-10-CM

## 2018-04-16 DIAGNOSIS — G8929 Other chronic pain: Secondary | ICD-10-CM

## 2018-04-16 NOTE — Patient Instructions (Signed)
  Exercises  Standing Bilateral Low Shoulder Row with Anchored Resistance - 10 reps - 3 sets - 1x daily - 7x weekly  Standing Row with Resistance with Anchored Resistance at Chest Height Palms Down - 10 reps - 3 sets - 1x daily - 7x weekly  Shoulder extension with resistance - Neutral - 10 reps - 3 sets - 1x daily - 7x weekly  Shoulder External Rotation with Anchored Resistance - 10 reps - 3 sets - 1x daily - 7x weekly  Shoulder Flexion Wall Walk - 10 reps - 3 sets - 1x daily - 7x weekly
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
## 2018-04-17 ENCOUNTER — Encounter (HOSPITAL_COMMUNITY): Payer: Self-pay

## 2018-04-18 ENCOUNTER — Ambulatory Visit (HOSPITAL_COMMUNITY): Payer: Medicaid Other

## 2018-08-25 ENCOUNTER — Other Ambulatory Visit: Payer: Self-pay | Admitting: *Deleted

## 2018-08-25 NOTE — Telephone Encounter (Signed)
Fax from [facility] RF request Amlodipine Comments: Rx was filled on 08/22/18, any RFs authorized will be placed on file. Denied RF last OV 12/10/17. Will NTBS before next RF

## 2018-11-25 ENCOUNTER — Other Ambulatory Visit: Payer: Self-pay | Admitting: Family

## 2018-11-25 NOTE — Telephone Encounter (Signed)
[facility]. NTBS. Last OV 01/2018 30 days sent in

## 2019-01-21 ENCOUNTER — Other Ambulatory Visit: Payer: Self-pay | Admitting: Family

## 2019-04-22 ENCOUNTER — Ambulatory Visit (INDEPENDENT_AMBULATORY_CARE_PROVIDER_SITE_OTHER): Payer: BC Managed Care – PPO | Admitting: Plastic Surgery

## 2019-04-22 ENCOUNTER — Other Ambulatory Visit: Payer: Self-pay

## 2019-04-22 ENCOUNTER — Encounter: Payer: Self-pay | Admitting: Plastic Surgery

## 2019-04-22 VITALS — BP 138/88 | HR 70 | Temp 97.5°F | Ht 68.0 in | Wt 265.2 lb

## 2019-04-22 DIAGNOSIS — N62 Hypertrophy of breast: Secondary | ICD-10-CM | POA: Diagnosis not present

## 2019-04-22 NOTE — Progress Notes (Signed)
Referring Provider Sharion Balloon, [place],   [id number]   CC:  Chief Complaint  Patient presents with  . Advice Only    for breast reduction      Carol Vargas is an 43 y.o. female.  HPI: Patient is here to discuss breast reduction.  She has had greater than 1 year of back pain that she attributes to her large pendulous breasts.  She is tried Tylenol and anti-inflammatory medications with no long-lasting benefit.  She also reports shoulder grooves from her bra straps.  She has seen orthopedic surgeon for her back who suggested breast reduction.  She is currently a 30 double D and wants to be around a C cup.  Her last mammogram was earlier this year and was normal.  She has no family history of breast cancer.  Allergies  Allergen Reactions  . Lisinopril Cough    Outpatient Encounter Medications as of 04/22/2019  Medication Sig  . acetaminophen (TYLENOL) 500 MG tablet Take 1,000 mg by mouth daily as needed for mild pain or headache.   . alendronate (FOSAMAX) 70 MG tablet Fosamax 70 mg tablet  Take 1 tablet every week by oral route.  Marland Kitchen amLODipine (NORVASC) 10 MG tablet Take 1 tablet (10 mg total) by mouth at bedtime. (Needs to be seen before next refill)  . atorvastatin (LIPITOR) 20 MG tablet Take 1 tablet (20 mg total) by mouth daily.  . Calcium Carb-Cholecalciferol (CALCIUM 600 + D PO) Take 1 tablet by mouth daily.  . celecoxib (CELEBREX) 200 MG capsule celecoxib 200 mg capsule  TAKE 1 CAPSULE BY MOUTH DAILY FOR 30 DAYS.  Marland Kitchen cycloSPORINE (RESTASIS) 0.05 % ophthalmic emulsion Restasis 0.05 % eye drops in a dropperette  . diclofenac sodium (VOLTAREN) 1 % GEL Apply topically See admin instructions.  . gabapentin (NEURONTIN) 300 MG capsule Take 1 capsule by mouth at bedtime.  Marland Kitchen HYDROcodone-acetaminophen (NORCO/VICODIN) 5-325 MG tablet Take 1 tablet by mouth every 6 (six) hours as needed for moderate pain.  . hydroxychloroquine (PLAQUENIL) 200 MG tablet  hydroxychloroquine 200 mg tablet  TAKE 2 TABLETS BY MOUTH EVERY DAY  . losartan-hydrochlorothiazide (HYZAAR) 100-25 MG tablet Take 1 tablet by mouth daily.  . [DISCONTINUED] aluminum chloride (DRYSOL) 20 % external solution Apply topically at bedtime. (Patient not taking: Reported on 03/24/2018)  . [DISCONTINUED] Iron-Vitamins (GERITOL) LIQD Take 15 mLs by mouth daily.  . [DISCONTINUED] omeprazole (PRILOSEC) 20 MG capsule Take 1 capsule (20 mg total) by mouth daily. (Patient not taking: Reported on 03/24/2018)  . [DISCONTINUED] phentermine (ADIPEX-P) 37.5 MG tablet Take 1 tablet (37.5 mg total) by mouth daily before breakfast.  . [DISCONTINUED] predniSONE (STERAPRED UNI-PAK 21 TAB) 10 MG (21) TBPK tablet As directed x 6 days (Patient not taking: Reported on 03/24/2018)  . [DISCONTINUED] Turmeric 500 MG TABS Take 500 mg by mouth every evening.   No facility-administered encounter medications on file as of 04/22/2019.      Past Medical History:  Diagnosis Date  . Arthritis   . Fibroids   . Headache(784.0)    at times, nothing severe per pt.menstral  . Hypertension     Past Surgical History:  Procedure Laterality Date  . BILATERAL SALPINGECTOMY Right 01/29/2017   Procedure: RIGHT SALPINGECTOMY;  Surgeon: Jonnie Kind, MD;  Location: [facility];  Service: Gynecology;  Laterality: Right;  . CESAREAN SECTION    . DILATION AND CURETTAGE OF UTERUS    . EXTERNAL FIXATION LEG Right 07/05/2013  Procedure: EXTERNAL FIXATION LEG;  Surgeon: Marianna Payment, MD;  Location: [facility];  Service: Orthopedics;  Laterality: Right;  . EXTERNAL FIXATION LEG Right 07/07/2013   Procedure: Ex-Fix Revision Right Tibial Plateau;  Surgeon: Rozanna Box, MD;  Location: [facility];  Service: Orthopedics;  Laterality: Right;  . EXTERNAL FIXATION REMOVAL Right 07/28/2013   Procedure: REMOVAL EXTERNAL FIXATION LEG;  Surgeon: Rozanna Box, MD;  Location: [facility];  Service: Orthopedics;  Laterality: Right;  . HARDWARE  REMOVAL Right 08/02/2014   Procedure: HARDWARE REMOVAL;  Surgeon: Vickey Huger, MD;  Location: [facility];  Service: Orthopedics;  Laterality: Right;  . KNEE ARTHROSCOPY Right 08/02/2014   Procedure: ARTHROSCOPY KNEE;  Surgeon: Vickey Huger, MD;  Location: [facility];  Service: Orthopedics;  Laterality: Right;  . ORIF TIBIA PLATEAU Right 07/28/2013   Procedure: OPEN REDUCTION INTERNAL FIXATION (ORIF) RIGHT TIBIAL PLATEAU;  Surgeon: Rozanna Box, MD;  Location: [facility];  Service: Orthopedics;  Laterality: Right;  . SUPRACERVICAL ABDOMINAL HYSTERECTOMY N/A 01/29/2017   Procedure: HYSTERECTOMY SUPRACERVICAL ABDOMINAL;  Surgeon: Jonnie Kind, MD;  Location: [facility];  Service: Gynecology;  Laterality: N/A;  . TONSILLECTOMY  1992  . TOTAL KNEE ARTHROPLASTY Right 01/03/2015   Procedure: TOTAL KNEE ARTHROPLASTY;  Surgeon: Vickey Huger, MD;  Location: [facility];  Service: Orthopedics;  Laterality: Right;  former tibial plateau fracture orif with hardware removed 07/2014    Family History  Problem Relation Age of Onset  . Hypertension Mother   . Diabetes Mellitus II Mother   . Osteoporosis Mother   . Fibroids Mother   . Stroke Father        died from this  . Hypertension Father   . Lung cancer Maternal Aunt   . Fibroids Sister     Social History   Social History Narrative  . Not on file     Review of Systems General: Denies fevers, chills, weight loss CV: Denies chest pain, shortness of breath, palpitations  Physical Exam Vitals with BMI 04/22/2019 02/03/2018 02/03/2018  Height 5\' 8"  - 5\' 8"   Weight 265 lbs 3 oz - 258 lbs  BMI [id number] - [id number]  Systolic 0000000 [id number]  Diastolic 88 89 90  Pulse 70 - 68    General:  No acute distress,  Alert and oriented, Non-Toxic, Normal speech and affect Breast: She has grade 3 ptosis.  Sternal notch to nipple is 37 cm bilaterally.  Nipple to fold distance is 14 cm bilaterally.  She appears to be fairly symmetric.  I do not notice any obvious masses or lymphadenopathy.   She does have evidence of shoulder grooving and skin pigmentation changes in the inframammary fold.  Assessment/Plan Patient presents with bilateral symptomatic macromastia.  We discussed the details of the breast reduction surgery including operative and postoperative expectations.  The details of breast reduction surgery were discussed.  I explained the procedure in detail along the with the expected scars.  The risks were discussed in detail and include bleeding, infection, damage to surrounding structures, need for additional procedures, nipple loss, change in nipple sensation, persistent pain, contour irregularities and asymmetries.  I explained that breast feeding is often not possible after breast reduction surgery.  We discussed the expected postoperative course with an overall recovery period of about 1 month.  She demonstrated full understanding of all risks.  I also discussed that her BMI puts her at a slightly higher risk category but that in my opinion it was still safe to move  forward with the procedure for her.  I anticipate approximately 900 g of tissue removed from each side.   [name] 04/22/2019, 11:56 AM

## 2019-05-05 ENCOUNTER — Other Ambulatory Visit: Payer: Self-pay

## 2019-05-05 ENCOUNTER — Encounter (HOSPITAL_BASED_OUTPATIENT_CLINIC_OR_DEPARTMENT_OTHER): Payer: Self-pay | Admitting: *Deleted

## 2019-05-07 ENCOUNTER — Ambulatory Visit (INDEPENDENT_AMBULATORY_CARE_PROVIDER_SITE_OTHER): Payer: BC Managed Care – PPO | Admitting: Plastic Surgery

## 2019-05-07 ENCOUNTER — Encounter: Payer: Self-pay | Admitting: Plastic Surgery

## 2019-05-07 ENCOUNTER — Other Ambulatory Visit: Payer: Self-pay

## 2019-05-07 VITALS — BP 108/71 | HR 77 | Temp 97.5°F | Ht 68.0 in | Wt 263.8 lb

## 2019-05-07 DIAGNOSIS — N62 Hypertrophy of breast: Secondary | ICD-10-CM

## 2019-05-07 NOTE — Progress Notes (Signed)
Referring Provider Sharion Balloon, [address] [place],  [place] [address]   CC:  Chief Complaint  Patient presents with  . Pre-op Exam    for (B) breast reduction      Carol Vargas is an 43 y.o. female.  HPI: She is here for preoperative appointment for breast reduction.  She is gotten a lot of information and feels like all of her questions have been answered.  She is looking forward to the surgery next week.  Allergies  Allergen Reactions  . Lisinopril Cough    Outpatient Encounter Medications as of 05/07/2019  Medication Sig  . alendronate (FOSAMAX) 70 MG tablet Fosamax 70 mg tablet  Take 1 tablet every week by oral route.  Marland Kitchen amLODipine (NORVASC) 10 MG tablet Take 1 tablet (10 mg total) by mouth at bedtime. (Needs to be seen before next refill)  . Calcium Carb-Cholecalciferol (CALCIUM 600 + D PO) Take 1 tablet by mouth daily.  . cyclobenzaprine (FLEXERIL) 10 MG tablet Take 10 mg by mouth 3 (three) times daily.  . cycloSPORINE (RESTASIS) 0.05 % ophthalmic emulsion Restasis 0.05 % eye drops in a dropperette  . diclofenac sodium (VOLTAREN) 1 % GEL Apply topically See admin instructions.  Marland Kitchen etodolac (LODINE XL) 400 MG 24 hr tablet Take 400 mg by mouth daily.  Marland Kitchen gabapentin (NEURONTIN) 300 MG capsule Take 300 mg by mouth at bedtime.  . hydrochlorothiazide (HYDRODIURIL) 25 MG tablet Take 25 mg by mouth daily.  . hydroxychloroquine (PLAQUENIL) 200 MG tablet hydroxychloroquine 200 mg tablet  TAKE 2 TABLETS BY MOUTH EVERY DAY  . losartan-hydrochlorothiazide (HYZAAR) 100-25 MG tablet Take 1 tablet by mouth daily.  . [DISCONTINUED] celecoxib (CELEBREX) 200 MG capsule celecoxib 200 mg capsule  TAKE 1 CAPSULE BY MOUTH DAILY FOR 30 DAYS.  . [DISCONTINUED] HYDROcodone-acetaminophen (NORCO/VICODIN) 5-325 MG tablet Take 1 tablet by mouth every 6 (six) hours as needed for moderate pain.   No facility-administered encounter medications on file as of 05/07/2019.      Past  Medical History:  Diagnosis Date  . Arthritis   . Fibroids   . Headache(784.0)    at times, nothing severe per pt.menstral  . Hypertension     Past Surgical History:  Procedure Laterality Date  . BILATERAL SALPINGECTOMY Right 01/29/2017   Procedure: RIGHT SALPINGECTOMY;  Surgeon: Jonnie Kind, MD;  Location: [facility];  Service: Gynecology;  Laterality: Right;  . CESAREAN SECTION    . DILATION AND CURETTAGE OF UTERUS    . EXTERNAL FIXATION LEG Right 07/05/2013   Procedure: EXTERNAL FIXATION LEG;  Surgeon: Marianna Payment, MD;  Location: [facility];  Service: Orthopedics;  Laterality: Right;  . EXTERNAL FIXATION LEG Right 07/07/2013   Procedure: Ex-Fix Revision Right Tibial Plateau;  Surgeon: Rozanna Box, MD;  Location: [facility];  Service: Orthopedics;  Laterality: Right;  . EXTERNAL FIXATION REMOVAL Right 07/28/2013   Procedure: REMOVAL EXTERNAL FIXATION LEG;  Surgeon: Rozanna Box, MD;  Location: [facility];  Service: Orthopedics;  Laterality: Right;  . HARDWARE REMOVAL Right 08/02/2014   Procedure: HARDWARE REMOVAL;  Surgeon: Vickey Huger, MD;  Location: [facility];  Service: Orthopedics;  Laterality: Right;  . KNEE ARTHROSCOPY Right 08/02/2014   Procedure: ARTHROSCOPY KNEE;  Surgeon: Vickey Huger, MD;  Location: [facility];  Service: Orthopedics;  Laterality: Right;  . ORIF TIBIA PLATEAU Right 07/28/2013   Procedure: OPEN REDUCTION INTERNAL FIXATION (ORIF) RIGHT TIBIAL PLATEAU;  Surgeon: Rozanna Box, MD;  Location: [facility];  Service: Orthopedics;  Laterality: Right;  . SUPRACERVICAL ABDOMINAL HYSTERECTOMY N/A 01/29/2017   Procedure: HYSTERECTOMY SUPRACERVICAL ABDOMINAL;  Surgeon: Jonnie Kind, MD;  Location: [facility];  Service: Gynecology;  Laterality: N/A;  . TONSILLECTOMY  1992  . TOTAL KNEE ARTHROPLASTY Right 01/03/2015   Procedure: TOTAL KNEE ARTHROPLASTY;  Surgeon: Vickey Huger, MD;  Location: [facility];  Service: Orthopedics;  Laterality: Right;  former tibial plateau fracture orif with hardware  removed 07/2014    Family History  Problem Relation Age of Onset  . Hypertension Mother   . Diabetes Mellitus II Mother   . Osteoporosis Mother   . Fibroids Mother   . Stroke Father        died from this  . Hypertension Father   . Lung cancer Maternal Aunt   . Fibroids Sister     Social History   Social History Narrative  . Not on file  Denies tobacco use  Review of Systems General: Denies fevers, chills, weight loss CV: Denies chest pain, shortness of breath, palpitations  Physical Exam Vitals with BMI 05/07/2019 05/05/2019 04/22/2019  Height 5\' 8"  5\' 8"  5\' 8"   Weight 263 lbs 13 oz 260 lbs 265 lbs 3 oz  BMI 40.12 Q000111Q [id number]  Systolic 123XX123 - 0000000  Diastolic 71 - 88  Pulse 77 - 70    General:  No acute distress,  Alert and oriented, Non-Toxic, Normal speech and affect Cardiovascular: Regular rhythm Pulmonary: Unlabored  Assessment/Plan Patient presents for preoperative appointment for breast reduction.  All of her questions were answered.  The risks and benefits and alternatives were discussed.  We discussed healthy eating choices that she could make that will help her leading up to and after the surgery.  We will plan to see her the day of her surgery.  [name] 05/07/2019, 9:27 AM

## 2019-05-07 NOTE — H&P (View-Only) (Signed)
Referring Provider Sharion Balloon, [address] [place],  [place] [address]   CC:  Chief Complaint  Patient presents with  . Pre-op Exam    for (B) breast reduction      Carol Vargas is an 43 y.o. female.  HPI: She is here for preoperative appointment for breast reduction.  She is gotten a lot of information and feels like all of her questions have been answered.  She is looking forward to the surgery next week.  Allergies  Allergen Reactions  . Lisinopril Cough    Outpatient Encounter Medications as of 05/07/2019  Medication Sig  . alendronate (FOSAMAX) 70 MG tablet Fosamax 70 mg tablet  Take 1 tablet every week by oral route.  Marland Kitchen amLODipine (NORVASC) 10 MG tablet Take 1 tablet (10 mg total) by mouth at bedtime. (Needs to be seen before next refill)  . Calcium Carb-Cholecalciferol (CALCIUM 600 + D PO) Take 1 tablet by mouth daily.  . cyclobenzaprine (FLEXERIL) 10 MG tablet Take 10 mg by mouth 3 (three) times daily.  . cycloSPORINE (RESTASIS) 0.05 % ophthalmic emulsion Restasis 0.05 % eye drops in a dropperette  . diclofenac sodium (VOLTAREN) 1 % GEL Apply topically See admin instructions.  Marland Kitchen etodolac (LODINE XL) 400 MG 24 hr tablet Take 400 mg by mouth daily.  Marland Kitchen gabapentin (NEURONTIN) 300 MG capsule Take 300 mg by mouth at bedtime.  . hydrochlorothiazide (HYDRODIURIL) 25 MG tablet Take 25 mg by mouth daily.  . hydroxychloroquine (PLAQUENIL) 200 MG tablet hydroxychloroquine 200 mg tablet  TAKE 2 TABLETS BY MOUTH EVERY DAY  . losartan-hydrochlorothiazide (HYZAAR) 100-25 MG tablet Take 1 tablet by mouth daily.  . [DISCONTINUED] celecoxib (CELEBREX) 200 MG capsule celecoxib 200 mg capsule  TAKE 1 CAPSULE BY MOUTH DAILY FOR 30 DAYS.  . [DISCONTINUED] HYDROcodone-acetaminophen (NORCO/VICODIN) 5-325 MG tablet Take 1 tablet by mouth every 6 (six) hours as needed for moderate pain.   No facility-administered encounter medications on file as of 05/07/2019.      Past  Medical History:  Diagnosis Date  . Arthritis   . Fibroids   . Headache(784.0)    at times, nothing severe per pt.menstral  . Hypertension     Past Surgical History:  Procedure Laterality Date  . BILATERAL SALPINGECTOMY Right 01/29/2017   Procedure: RIGHT SALPINGECTOMY;  Surgeon: Jonnie Kind, MD;  Location: [facility];  Service: Gynecology;  Laterality: Right;  . CESAREAN SECTION    . DILATION AND CURETTAGE OF UTERUS    . EXTERNAL FIXATION LEG Right 07/05/2013   Procedure: EXTERNAL FIXATION LEG;  Surgeon: Marianna Payment, MD;  Location: [facility];  Service: Orthopedics;  Laterality: Right;  . EXTERNAL FIXATION LEG Right 07/07/2013   Procedure: Ex-Fix Revision Right Tibial Plateau;  Surgeon: Rozanna Box, MD;  Location: [facility];  Service: Orthopedics;  Laterality: Right;  . EXTERNAL FIXATION REMOVAL Right 07/28/2013   Procedure: REMOVAL EXTERNAL FIXATION LEG;  Surgeon: Rozanna Box, MD;  Location: [facility];  Service: Orthopedics;  Laterality: Right;  . HARDWARE REMOVAL Right 08/02/2014   Procedure: HARDWARE REMOVAL;  Surgeon: Vickey Huger, MD;  Location: [facility];  Service: Orthopedics;  Laterality: Right;  . KNEE ARTHROSCOPY Right 08/02/2014   Procedure: ARTHROSCOPY KNEE;  Surgeon: Vickey Huger, MD;  Location: [facility];  Service: Orthopedics;  Laterality: Right;  . ORIF TIBIA PLATEAU Right 07/28/2013   Procedure: OPEN REDUCTION INTERNAL FIXATION (ORIF) RIGHT TIBIAL PLATEAU;  Surgeon: Rozanna Box, MD;  Location: [facility];  Service: Orthopedics;  Laterality: Right;  . SUPRACERVICAL ABDOMINAL HYSTERECTOMY N/A 01/29/2017   Procedure: HYSTERECTOMY SUPRACERVICAL ABDOMINAL;  Surgeon: Jonnie Kind, MD;  Location: [facility];  Service: Gynecology;  Laterality: N/A;  . TONSILLECTOMY  1992  . TOTAL KNEE ARTHROPLASTY Right 01/03/2015   Procedure: TOTAL KNEE ARTHROPLASTY;  Surgeon: Vickey Huger, MD;  Location: [facility];  Service: Orthopedics;  Laterality: Right;  former tibial plateau fracture orif with hardware  removed 07/2014    Family History  Problem Relation Age of Onset  . Hypertension Mother   . Diabetes Mellitus II Mother   . Osteoporosis Mother   . Fibroids Mother   . Stroke Father        died from this  . Hypertension Father   . Lung cancer Maternal Aunt   . Fibroids Sister     Social History   Social History Narrative  . Not on file  Denies tobacco use  Review of Systems General: Denies fevers, chills, weight loss CV: Denies chest pain, shortness of breath, palpitations  Physical Exam Vitals with BMI 05/07/2019 05/05/2019 04/22/2019  Height 5\' 8"  5\' 8"  5\' 8"   Weight 263 lbs 13 oz 260 lbs 265 lbs 3 oz  BMI 40.12 Q000111Q [id number]  Systolic 123XX123 - 0000000  Diastolic 71 - 88  Pulse 77 - 70    General:  No acute distress,  Alert and oriented, Non-Toxic, Normal speech and affect Cardiovascular: Regular rhythm Pulmonary: Unlabored  Assessment/Plan Patient presents for preoperative appointment for breast reduction.  All of her questions were answered.  The risks and benefits and alternatives were discussed.  We discussed healthy eating choices that she could make that will help her leading up to and after the surgery.  We will plan to see her the day of her surgery.  [name] 05/07/2019, 9:27 AM

## 2019-05-08 ENCOUNTER — Encounter (HOSPITAL_BASED_OUTPATIENT_CLINIC_OR_DEPARTMENT_OTHER)
Admission: RE | Admit: 2019-05-08 | Discharge: 2019-05-08 | Disposition: A | Discharge status: Home / Self Care | Payer: BC Managed Care – PPO | Source: Ambulatory Visit | Attending: Plastic Surgery | Admitting: Plastic Surgery

## 2019-05-08 ENCOUNTER — Other Ambulatory Visit: Payer: Self-pay

## 2019-05-08 ENCOUNTER — Other Ambulatory Visit (HOSPITAL_COMMUNITY)
Admission: RE | Admit: 2019-05-08 | Discharge: 2019-05-08 | Disposition: A | Discharge status: Home / Self Care | Payer: BC Managed Care – PPO | Source: Ambulatory Visit | Attending: Plastic Surgery | Admitting: Plastic Surgery

## 2019-05-08 DIAGNOSIS — N62 Hypertrophy of breast: Secondary | ICD-10-CM | POA: Diagnosis not present

## 2019-05-08 DIAGNOSIS — Z01812 Encounter for preprocedural laboratory examination: Secondary | ICD-10-CM | POA: Insufficient documentation

## 2019-05-08 DIAGNOSIS — Z01818 Encounter for other preprocedural examination: Secondary | ICD-10-CM | POA: Insufficient documentation

## 2019-05-08 DIAGNOSIS — Z20828 Contact with and (suspected) exposure to other viral communicable diseases: Secondary | ICD-10-CM | POA: Diagnosis not present

## 2019-05-08 NOTE — Progress Notes (Signed)
Surgical soap given with instructions, pt verbalized understanding.  

## 2019-05-09 LAB — NOVEL CORONAVIRUS, NAA (HOSP ORDER, SEND-OUT TO REF LAB; TAT 18-24 HRS): SARS-CoV-2, NAA: NOT DETECTED

## 2019-05-11 NOTE — Anesthesia Preprocedure Evaluation (Addendum)
Anesthesia Evaluation  Patient identified by MRN, date of birth, ID band Patient awake    Reviewed: Allergy & Precautions, NPO status , Patient's Chart, lab work & pertinent test results  Airway Mallampati: III  TM Distance: >3 FB Neck ROM: Full    Dental no notable dental hx.    Pulmonary former smoker,    Pulmonary exam normal breath sounds clear to auscultation       Cardiovascular hypertension, Pt. on medications Normal cardiovascular exam Rhythm:Regular Rate:Normal     Neuro/Psych  Headaches, negative psych ROS   GI/Hepatic negative GI ROS, Neg liver ROS,   Endo/Other  Morbid obesity  Renal/GU negative Renal ROS     Musculoskeletal negative musculoskeletal ROS (+)   Abdominal (+) + obese,   Peds  Hematology negative hematology ROS (+)   Anesthesia Other Findings macromastia  Reproductive/Obstetrics S/p hysterectomy                            Anesthesia Physical Anesthesia Plan  ASA: III  Anesthesia Plan: General   Post-op Pain Management:    Induction: Intravenous  PONV Risk Score and Plan: 3 and Midazolam, Dexamethasone and Ondansetron  Airway Management Planned: Oral ETT  Additional Equipment:   Intra-op Plan:   Post-operative Plan: Extubation in OR  Informed Consent: I have reviewed the patients History and Physical, chart, labs and discussed the procedure including the risks, benefits and alternatives for the proposed anesthesia with the patient or authorized representative who has indicated his/her understanding and acceptance.     Dental advisory given  Plan Discussed with: CRNA  Anesthesia Plan Comments:         Anesthesia Quick Evaluation

## 2019-05-12 ENCOUNTER — Ambulatory Visit (HOSPITAL_BASED_OUTPATIENT_CLINIC_OR_DEPARTMENT_OTHER)
Admission: RE | Admit: 2019-05-12 | Discharge: 2019-05-12 | Disposition: A | Discharge status: Home / Self Care | Payer: BC Managed Care – PPO | Attending: Plastic Surgery | Admitting: Plastic Surgery

## 2019-05-12 ENCOUNTER — Encounter (HOSPITAL_BASED_OUTPATIENT_CLINIC_OR_DEPARTMENT_OTHER): Payer: Self-pay | Admitting: Certified Registered"

## 2019-05-12 ENCOUNTER — Ambulatory Visit (HOSPITAL_BASED_OUTPATIENT_CLINIC_OR_DEPARTMENT_OTHER): Payer: BC Managed Care – PPO | Admitting: Certified Registered"

## 2019-05-12 ENCOUNTER — Other Ambulatory Visit: Payer: Self-pay

## 2019-05-12 ENCOUNTER — Institutional Professional Consult (permissible substitution): Payer: Medicaid Other | Admitting: Plastic Surgery

## 2019-05-12 ENCOUNTER — Encounter (HOSPITAL_BASED_OUTPATIENT_CLINIC_OR_DEPARTMENT_OTHER)
Admission: RE | Disposition: A | Discharge status: Home / Self Care | Payer: Self-pay | Source: Home / Self Care | Attending: Plastic Surgery

## 2019-05-12 DIAGNOSIS — Z87891 Personal history of nicotine dependence: Secondary | ICD-10-CM | POA: Insufficient documentation

## 2019-05-12 DIAGNOSIS — Z79899 Other long term (current) drug therapy: Secondary | ICD-10-CM | POA: Insufficient documentation

## 2019-05-12 DIAGNOSIS — N62 Hypertrophy of breast: Secondary | ICD-10-CM | POA: Insufficient documentation

## 2019-05-12 DIAGNOSIS — I10 Essential (primary) hypertension: Secondary | ICD-10-CM | POA: Insufficient documentation

## 2019-05-12 DIAGNOSIS — Z6841 Body Mass Index (BMI) 40.0 and over, adult: Secondary | ICD-10-CM | POA: Insufficient documentation

## 2019-05-12 HISTORY — PX: BREAST REDUCTION SURGERY: SHX8

## 2019-05-12 SURGERY — MAMMOPLASTY, REDUCTION
Anesthesia: General | Site: Breast | Laterality: Bilateral

## 2019-05-12 MED ORDER — EPINEPHRINE PF 1 MG/ML IJ SOLN
INTRAMUSCULAR | Status: AC
  Filled 2019-05-12: qty 1

## 2019-05-12 MED ORDER — EPHEDRINE 5 MG/ML INJ
INTRAVENOUS | Status: AC
  Filled 2019-05-12: qty 20

## 2019-05-12 MED ORDER — CEFAZOLIN SODIUM-DEXTROSE 2-4 GM/100ML-% IV SOLN
2.0000 g | INTRAVENOUS | Status: DC
  Administered 2019-05-12: 3 g via INTRAVENOUS

## 2019-05-12 MED ORDER — DEXTROSE 5 % IV SOLN: 3.0000 g | Freq: Once | INTRAVENOUS | Status: DC

## 2019-05-12 MED ORDER — BUPIVACAINE HCL (PF) 0.5 % IJ SOLN
INTRAMUSCULAR | Status: AC
  Filled 2019-05-12: qty 30

## 2019-05-12 MED ORDER — BUPIVACAINE HCL 0.5 % IJ SOLN
INTRAMUSCULAR | Status: DC | PRN
  Administered 2019-05-12: 30 mL

## 2019-05-12 MED ORDER — CEFAZOLIN SODIUM-DEXTROSE 1-4 GM/50ML-% IV SOLN
INTRAVENOUS | Status: AC
  Filled 2019-05-12: qty 50

## 2019-05-12 MED ORDER — PHENYLEPHRINE 40 MCG/ML (10ML) SYRINGE FOR IV PUSH (FOR BLOOD PRESSURE SUPPORT)
PREFILLED_SYRINGE | INTRAVENOUS | Status: AC
  Filled 2019-05-12: qty 20

## 2019-05-12 MED ORDER — PROPOFOL 10 MG/ML IV BOLUS
INTRAVENOUS | Status: DC | PRN
  Administered 2019-05-12: 150 mg via INTRAVENOUS

## 2019-05-12 MED ORDER — PROPOFOL 10 MG/ML IV BOLUS
INTRAVENOUS | Status: AC
  Filled 2019-05-12: qty 20

## 2019-05-12 MED ORDER — MIDAZOLAM HCL 2 MG/2ML IJ SOLN
1.0000 mg | INTRAMUSCULAR | Status: DC | PRN
  Administered 2019-05-12: 2 mg via INTRAVENOUS

## 2019-05-12 MED ORDER — SUGAMMADEX SODIUM 200 MG/2ML IV SOLN
INTRAVENOUS | Status: DC | PRN
  Administered 2019-05-12: 200 mg via INTRAVENOUS

## 2019-05-12 MED ORDER — DEXAMETHASONE SODIUM PHOSPHATE 10 MG/ML IJ SOLN
INTRAMUSCULAR | Status: AC
  Filled 2019-05-12: qty 1

## 2019-05-12 MED ORDER — DEXAMETHASONE SODIUM PHOSPHATE 4 MG/ML IJ SOLN
INTRAMUSCULAR | Status: DC | PRN
  Administered 2019-05-12: 10 mg via INTRAVENOUS

## 2019-05-12 MED ORDER — HYDROMORPHONE HCL 1 MG/ML IJ SOLN
0.2500 mg | INTRAMUSCULAR | Status: DC | PRN
  Administered 2019-05-12: 0.5 mg via INTRAVENOUS

## 2019-05-12 MED ORDER — EPHEDRINE SULFATE 50 MG/ML IJ SOLN
INTRAMUSCULAR | Status: DC | PRN
  Administered 2019-05-12: 10 mg via INTRAVENOUS

## 2019-05-12 MED ORDER — EPINEPHRINE PF 1 MG/ML IJ SOLN
INTRAMUSCULAR | Status: DC | PRN
  Administered 2019-05-12: 1 mg via SUBCUTANEOUS

## 2019-05-12 MED ORDER — LIDOCAINE HCL (CARDIAC) PF 100 MG/5ML IV SOSY
PREFILLED_SYRINGE | INTRAVENOUS | Status: DC | PRN
  Administered 2019-05-12: 60 mg via INTRAVENOUS

## 2019-05-12 MED ORDER — ONDANSETRON HCL 4 MG/2ML IJ SOLN
INTRAMUSCULAR | Status: AC
  Filled 2019-05-12: qty 4

## 2019-05-12 MED ORDER — OXYCODONE HCL 5 MG/5ML PO SOLN: 5.0000 mg | Freq: Once | ORAL | Status: AC | PRN

## 2019-05-12 MED ORDER — HYDROCODONE-ACETAMINOPHEN 5-325 MG PO TABS: 1.0000 | ORAL_TABLET | ORAL | 0 refills | Status: AC | PRN

## 2019-05-12 MED ORDER — HYDROMORPHONE HCL 1 MG/ML IJ SOLN
INTRAMUSCULAR | Status: AC
  Filled 2019-05-12: qty 0.5

## 2019-05-12 MED ORDER — CHLORHEXIDINE GLUCONATE CLOTH 2 % EX PADS: 6.0000 | MEDICATED_PAD | Freq: Once | CUTANEOUS | Status: DC

## 2019-05-12 MED ORDER — PROPOFOL 500 MG/50ML IV EMUL
INTRAVENOUS | Status: AC
  Filled 2019-05-12: qty 50

## 2019-05-12 MED ORDER — CEFAZOLIN SODIUM-DEXTROSE 2-4 GM/100ML-% IV SOLN
INTRAVENOUS | Status: AC
  Filled 2019-05-12: qty 100

## 2019-05-12 MED ORDER — OXYCODONE HCL 5 MG PO TABS
5.0000 mg | ORAL_TABLET | Freq: Once | ORAL | Status: AC | PRN
  Administered 2019-05-12: 5 mg via ORAL

## 2019-05-12 MED ORDER — ACETAMINOPHEN 500 MG PO TABS
1000.0000 mg | ORAL_TABLET | Freq: Once | ORAL | Status: AC
  Administered 2019-05-12: 1000 mg via ORAL

## 2019-05-12 MED ORDER — PROPOFOL 500 MG/50ML IV EMUL
INTRAVENOUS | Status: DC | PRN
  Administered 2019-05-12: 25 ug/kg/min via INTRAVENOUS

## 2019-05-12 MED ORDER — ONDANSETRON HCL 4 MG PO TABS: 4.0000 mg | ORAL_TABLET | Freq: Three times a day (TID) | ORAL | 1 refills | Status: AC | PRN

## 2019-05-12 MED ORDER — PROMETHAZINE HCL 25 MG/ML IJ SOLN: 6.2500 mg | INTRAMUSCULAR | Status: DC | PRN

## 2019-05-12 MED ORDER — SUGAMMADEX SODIUM 500 MG/5ML IV SOLN
INTRAVENOUS | Status: AC
  Filled 2019-05-12: qty 5

## 2019-05-12 MED ORDER — LACTATED RINGERS IV SOLN
INTRAVENOUS | Status: DC
  Administered 2019-05-12 (×2): via INTRAVENOUS

## 2019-05-12 MED ORDER — FENTANYL CITRATE (PF) 100 MCG/2ML IJ SOLN
50.0000 ug | INTRAMUSCULAR | Status: AC | PRN
  Administered 2019-05-12 (×2): 50 ug via INTRAVENOUS
  Administered 2019-05-12: 100 ug via INTRAVENOUS

## 2019-05-12 MED ORDER — ONDANSETRON HCL 4 MG/2ML IJ SOLN
INTRAMUSCULAR | Status: DC | PRN
  Administered 2019-05-12: 4 mg via INTRAVENOUS

## 2019-05-12 MED ORDER — ROCURONIUM BROMIDE 100 MG/10ML IV SOLN
INTRAVENOUS | Status: DC | PRN
  Administered 2019-05-12: 60 mg via INTRAVENOUS

## 2019-05-12 MED ORDER — FENTANYL CITRATE (PF) 100 MCG/2ML IJ SOLN
INTRAMUSCULAR | Status: AC
  Filled 2019-05-12: qty 2

## 2019-05-12 MED ORDER — ROCURONIUM BROMIDE 10 MG/ML (PF) SYRINGE
PREFILLED_SYRINGE | INTRAVENOUS | Status: AC
  Filled 2019-05-12: qty 10

## 2019-05-12 MED ORDER — SODIUM CHLORIDE (PF) 0.9 % IJ SOLN
INTRAMUSCULAR | Status: AC
  Filled 2019-05-12: qty 20

## 2019-05-12 MED ORDER — MIDAZOLAM HCL 2 MG/2ML IJ SOLN
INTRAMUSCULAR | Status: AC
  Filled 2019-05-12: qty 2

## 2019-05-12 MED ORDER — ACETAMINOPHEN 500 MG PO TABS
ORAL_TABLET | ORAL | Status: AC
  Filled 2019-05-12: qty 2

## 2019-05-12 MED ORDER — LIDOCAINE 2% (20 MG/ML) 5 ML SYRINGE
INTRAMUSCULAR | Status: AC
  Filled 2019-05-12: qty 5

## 2019-05-12 MED ORDER — OXYCODONE HCL 5 MG PO TABS
ORAL_TABLET | ORAL | Status: AC
  Filled 2019-05-12: qty 1

## 2019-05-12 MED ORDER — BUPIVACAINE LIPOSOME 1.3 % IJ SUSP
INTRAMUSCULAR | Status: AC
  Filled 2019-05-12: qty 20

## 2019-05-12 MED ORDER — KETOROLAC TROMETHAMINE 30 MG/ML IJ SOLN: 30.0000 mg | Freq: Once | INTRAMUSCULAR | Status: DC | PRN

## 2019-05-12 SURGICAL SUPPLY — 77 items
APL PRP STRL LF DISP 70% ISPRP (MISCELLANEOUS) ×2
APL SKNCLS STERI-STRIP NONHPOA (GAUZE/BANDAGES/DRESSINGS) ×2
APPLIER CLIP 9.375 MED OPEN (MISCELLANEOUS)
APR CLP MED 9.3 20 MLT OPN (MISCELLANEOUS)
BAG DECANTER FOR FLEXI CONT (MISCELLANEOUS) ×2 IMPLANT
BENZOIN TINCTURE PRP APPL 2/3 (GAUZE/BANDAGES/DRESSINGS) ×4 IMPLANT
BINDER BREAST 3XL (GAUZE/BANDAGES/DRESSINGS) ×2 IMPLANT
BINDER BREAST LRG (GAUZE/BANDAGES/DRESSINGS) IMPLANT
BINDER BREAST MEDIUM (GAUZE/BANDAGES/DRESSINGS) IMPLANT
BINDER BREAST XLRG (GAUZE/BANDAGES/DRESSINGS) IMPLANT
BINDER BREAST XXLRG (GAUZE/BANDAGES/DRESSINGS) IMPLANT
BLADE SURG 10 STRL SS (BLADE) ×8 IMPLANT
BNDG GAUZE ELAST 4 BULKY (GAUZE/BANDAGES/DRESSINGS) ×4 IMPLANT
CANISTER SUCT 1200ML W/VALVE (MISCELLANEOUS) ×2 IMPLANT
CHLORAPREP W/TINT 26 (MISCELLANEOUS) ×4 IMPLANT
CLIP APPLIE 9.375 MED OPEN (MISCELLANEOUS) IMPLANT
CLIP VESOCCLUDE MED 6/CT (CLIP) IMPLANT
COVER BACK TABLE REUSABLE LG (DRAPES) ×2 IMPLANT
COVER MAYO STAND REUSABLE (DRAPES) ×2 IMPLANT
COVER WAND RF STERILE (DRAPES) IMPLANT
DERMABOND ADVANCED (GAUZE/BANDAGES/DRESSINGS)
DERMABOND ADVANCED .7 DNX12 (GAUZE/BANDAGES/DRESSINGS) IMPLANT
DRAIN CHANNEL 15F RND FF W/TCR (WOUND CARE) IMPLANT
DRAPE HALF SHEET 70X43 (DRAPES) ×2 IMPLANT
DRAPE TOP ARMCOVERS (MISCELLANEOUS) ×2 IMPLANT
DRAPE U-SHAPE 76X120 STRL (DRAPES) ×2 IMPLANT
DRAPE UTILITY XL STRL (DRAPES) ×2 IMPLANT
DRSG PAD ABDOMINAL 8X10 ST (GAUZE/BANDAGES/DRESSINGS) ×4 IMPLANT
ELECT COATED BLADE 2.86 ST (ELECTRODE) ×2 IMPLANT
ELECT REM PT RETURN 9FT ADLT (ELECTROSURGICAL) ×2
ELECTRODE REM PT RTRN 9FT ADLT (ELECTROSURGICAL) ×1 IMPLANT
EVACUATOR SILICONE 100CC (DRAIN) IMPLANT
GLOVE BIO SURGEON STRL SZ 6.5 (GLOVE) ×6 IMPLANT
GLOVE BIOGEL M STRL SZ7.5 (GLOVE) ×2 IMPLANT
GLOVE BIOGEL PI IND STRL 7.0 (GLOVE) ×1 IMPLANT
GLOVE BIOGEL PI IND STRL 8 (GLOVE) IMPLANT
GLOVE BIOGEL PI INDICATOR 7.0 (GLOVE) ×1
GLOVE BIOGEL PI INDICATOR 8 (GLOVE)
GLOVE ECLIPSE 6.5 STRL STRAW (GLOVE) ×2 IMPLANT
GOWN STRL REUS W/ TWL LRG LVL3 (GOWN DISPOSABLE) ×6 IMPLANT
GOWN STRL REUS W/TWL LRG LVL3 (GOWN DISPOSABLE) ×12
MARKER SKIN DUAL TIP RULER LAB (MISCELLANEOUS) ×2 IMPLANT
NDL SAFETY ECLIPSE 18X1.5 (NEEDLE) ×2 IMPLANT
NEEDLE FILTER BLUNT 18X 1/2SAF (NEEDLE) ×1
NEEDLE FILTER BLUNT 18X1 1/2 (NEEDLE) ×1 IMPLANT
NEEDLE HYPO 18GX1.5 SHARP (NEEDLE) ×4
NEEDLE HYPO 25X1 1.5 SAFETY (NEEDLE) ×2 IMPLANT
NEEDLE SPNL 18GX3.5 QUINCKE PK (NEEDLE) ×4 IMPLANT
NS IRRIG 1000ML POUR BTL (IV SOLUTION) IMPLANT
PACK BASIN DAY SURGERY FS (CUSTOM PROCEDURE TRAY) ×2 IMPLANT
PENCIL BUTTON HOLSTER BLD 10FT (ELECTRODE) ×2 IMPLANT
PIN SAFETY STERILE (MISCELLANEOUS) IMPLANT
SLEEVE SCD COMPRESS KNEE MED (MISCELLANEOUS) ×2 IMPLANT
SPONGE LAP 18X18 RF (DISPOSABLE) ×6 IMPLANT
STAPLER INSORB 30 2030 C-SECTI (MISCELLANEOUS) ×4 IMPLANT
STAPLER VISISTAT 35W (STAPLE) ×2 IMPLANT
STRIP CLOSURE SKIN 1/2X4 (GAUZE/BANDAGES/DRESSINGS) ×8 IMPLANT
SUT ETHILON 2 0 FS 18 (SUTURE) IMPLANT
SUT MNCRL AB 3-0 PS2 18 (SUTURE) ×4 IMPLANT
SUT MNCRL AB 4-0 PS2 18 (SUTURE) ×4 IMPLANT
SUT PDS 3-0 CT2 (SUTURE) ×2
SUT PDS AB 2-0 CT2 27 (SUTURE) IMPLANT
SUT PDS II 3-0 CT2 27 ABS (SUTURE) ×1 IMPLANT
SUT VIC AB 3-0 PS1 18 (SUTURE)
SUT VIC AB 3-0 PS1 18XBRD (SUTURE) IMPLANT
SUT VICRYL 4-0 PS2 18IN ABS (SUTURE) IMPLANT
SUT VLOC 90 P-14 23 (SUTURE) ×6 IMPLANT
SYR 20ML LL LF (SYRINGE) ×2 IMPLANT
SYR 50ML LL SCALE MARK (SYRINGE) ×4 IMPLANT
SYR BULB IRRIGATION 50ML (SYRINGE) ×2 IMPLANT
SYR CONTROL 10ML LL (SYRINGE) ×2 IMPLANT
TAPE MEASURE VINYL STERILE (MISCELLANEOUS) IMPLANT
TOWEL GREEN STERILE FF (TOWEL DISPOSABLE) ×4 IMPLANT
TRAY FOLEY W/BAG SLVR 14FR LF (SET/KITS/TRAYS/PACK) IMPLANT
TUBE CONNECTING 20X1/4 (TUBING) ×2 IMPLANT
UNDERPAD 30X36 HEAVY ABSORB (UNDERPADS AND DIAPERS) ×4 IMPLANT
YANKAUER SUCT BULB TIP NO VENT (SUCTIONS) ×4 IMPLANT

## 2019-05-12 NOTE — Anesthesia Postprocedure Evaluation (Signed)
Anesthesia Post Note  Patient: Carol Vargas  Procedure(s) Performed: BILATERAL MAMMARY REDUCTION  (BREAST) (Bilateral Breast)     Patient location during evaluation: PACU Anesthesia Type: General Level of consciousness: awake and alert Pain management: pain level controlled Vital Signs Assessment: post-procedure vital signs reviewed and stable Respiratory status: spontaneous breathing, nonlabored ventilation, respiratory function stable and patient connected to nasal cannula oxygen Cardiovascular status: blood pressure returned to baseline and stable Postop Assessment: no apparent nausea or vomiting Anesthetic complications: no    Last Vitals:  Vitals:   05/12/19 1130 05/12/19 1200  BP: 123/83 (!) 141/89  Pulse: 88 84  Resp: 18 16  Temp:  37 C  SpO2: 93% 98%    Last Pain:  Vitals:   05/12/19 1200  TempSrc:   PainSc: 2                  Jordie Schreur P Aitanna Haubner

## 2019-05-12 NOTE — Brief Op Note (Signed)
05/12/2019  10:32 AM  PATIENT:  Lezlie Lye  43 y.o. female  PRE-OPERATIVE DIAGNOSIS:  macromastia  POST-OPERATIVE DIAGNOSIS:  macromastia  PROCEDURE:  Procedure(s) with comments: BILATERAL MAMMARY REDUCTION  (BREAST) (Bilateral) - 3.5 hours, please  SURGEON:  Surgeon(s) and Role:    * Teegan Guinther, Steffanie Dunn, MD - Primary  PHYSICIAN ASSISTANT:   ASSISTANTS: Phoebe Sharps, PA  ANESTHESIA:   general  EBL:  50cc   BLOOD ADMINISTERED:none  DRAINS: none   LOCAL MEDICATIONS USED:  BUPIVICAINE   SPECIMEN:  Source of Specimen:  Right and left breast tissue  DISPOSITION OF SPECIMEN:  PATHOLOGY  COUNTS:  YES  TOURNIQUET:  * No tourniquets in log *  DICTATION: .Dragon Dictation  PLAN OF CARE: Discharge to home after PACU  PATIENT DISPOSITION:  PACU - hemodynamically stable.   Delay start of Pharmacological VTE agent (>24hrs) due to surgical blood loss or risk of bleeding: not applicable

## 2019-05-12 NOTE — Interval H&P Note (Signed)
History and Physical Interval Note:  05/12/2019 7:15 AM  Carol Vargas  has presented today for surgery, with the diagnosis of macromastia.  The various methods of treatment have been discussed with the patient and family. After consideration of risks, benefits and other options for treatment, the patient has consented to  Procedure(s) with comments: MAMMARY REDUCTION  (BREAST) (Bilateral) - 3.5 hours, please as a surgical intervention.  The patient's history has been reviewed, patient examined, no change in status, stable for surgery.  I have reviewed the patient's chart and labs.  Questions were answered to the patient's satisfaction.     [name]

## 2019-05-12 NOTE — Discharge Instructions (Signed)
Activity As tolerated: NO showers for 3 days No heavy activities  Diet: Regular  Wound Care: Keep dressing clean & dry for 3 days.  Keep wrap applied with compression as much as possible.    Do not change dressings for 3 days unless soiled.  Can change if needed but make sure to reapply wrap. After three days can remove wrap and shower.  Then reapply dressings if needed and continue compression with wrap or soft sports bra. Call Doctor if any unusual problems occur such as pain, excessive Bleeding, unrelieved Nausea/vomiting, Fever &/or chills Follow-up appointment: Scheduled for next week.   Post Anesthesia Home Care Instructions  Activity: Get plenty of rest for the remainder of the day. A responsible individual must stay with you for 24 hours following the procedure.  For the next 24 hours, DO NOT: -Drive a car -Paediatric nurse -Drink alcoholic beverages -Take any medication unless instructed by your physician -Make any legal decisions or sign important papers.  Meals: Start with liquid foods such as gelatin or soup. Progress to regular foods as tolerated. Avoid greasy, spicy, heavy foods. If nausea and/or vomiting occur, drink only clear liquids until the nausea and/or vomiting subsides. Call your physician if vomiting continues.  Special Instructions/Symptoms: Your throat may feel dry or sore from the anesthesia or the breathing tube placed in your throat during surgery. If this causes discomfort, gargle with warm salt water. The discomfort should disappear within 24 hours.  If you had a scopolamine patch placed behind your ear for the management of post- operative nausea and/or vomiting:  1. The medication in the patch is effective for 72 hours, after which it should be removed.  Wrap patch in a tissue and discard in the trash. Wash hands thoroughly with soap and water. 2. You may remove the patch earlier than 72 hours if you experience unpleasant side effects which may  include dry mouth, dizziness or visual disturbances. 3. Avoid touching the patch. Wash your hands with soap and water after contact with the patch.  No tylenol until after 1230 today.

## 2019-05-12 NOTE — Transfer of Care (Signed)
Immediate Anesthesia Transfer of Care Note  Patient: Carol Vargas  Procedure(s) Performed: BILATERAL MAMMARY REDUCTION  (BREAST) (Bilateral Breast)  Patient Location: PACU  Anesthesia Type:General  Level of Consciousness: awake  Airway & Oxygen Therapy: Patient Spontanous Breathing and Patient connected to face mask oxygen  Post-op Assessment: Report given to RN and Post -op Vital signs reviewed and stable  Post vital signs: Reviewed and stable  Last Vitals:  Vitals Value Taken Time  BP    Temp    Pulse 86 05/12/19 1035  Resp 29 05/12/19 1035  SpO2 100 % 05/12/19 1035  Vitals shown include unvalidated device data.  Last Pain:  Vitals:   05/12/19 0634  TempSrc: Oral  PainSc: 0-No pain      Patients Stated Pain Goal: 1 ([id number])  Complications: No apparent anesthesia complications

## 2019-05-12 NOTE — Anesthesia Procedure Notes (Signed)
Procedure Name: Intubation Date/Time: 05/12/2019 7:43 AM Performed by: Signe Colt, CRNA Pre-anesthesia Checklist: Patient identified, Emergency Drugs available, Suction available and Patient being monitored Patient Re-evaluated:Patient Re-evaluated prior to induction Oxygen Delivery Method: Circle system utilized Preoxygenation: Pre-oxygenation with 100% oxygen Induction Type: IV induction Ventilation: Mask ventilation without difficulty Laryngoscope Size: Mac and 3 Grade View: Grade I Tube type: Oral Tube size: 7.0 mm Number of attempts: 1 Airway Equipment and Method: Stylet and Oral airway Placement Confirmation: ETT inserted through vocal cords under direct vision,  positive ETCO2 and breath sounds checked- equal and bilateral Secured at: 21 cm Tube secured with: Tape Dental Injury: Teeth and Oropharynx as per pre-operative assessment

## 2019-05-12 NOTE — Op Note (Signed)
Operative Note   DATE OF OPERATION: 05/12/2019  LOCATION: [facility]   SURGICAL DEPARTMENT: Plastic Surgery  PREOPERATIVE DIAGNOSES: Bilateral symptomatic macromastia.  POSTOPERATIVE DIAGNOSES:  same  PROCEDURE: Bilateral breast reduction with superomedial pedicle.  SURGEON: Talmadge Coventry, MD  ASSISTANT: Phoebe Sharps, PA  ANESTHESIA: General.  COMPLICATIONS: None.   INDICATIONS FOR PROCEDURE:  The patient, Carol Vargas is a 43 y.o. female born on [birth year], is here for treatment of bilateral symptomatic macromastia. MRN: [id number]  CONSENT:  Informed consent was obtained directly from the patient. Risks, benefits and alternatives were fully discussed. Specific risks including but not limited to bleeding, infection, hematoma, seroma, scarring, pain, infection, contracture, asymmetry, wound healing problems, and need for further surgery were all discussed. The patient did have an ample opportunity to have questions answered to satisfaction.   DESCRIPTION OF PROCEDURE:  The patient was marked preoperatively for a Wise pattern skin excision.  The patient was taken to the operating room. SCDs were placed and antibiotics were given. General anesthesia was administered.The patient's operative site was prepped and draped in a sterile fashion. A time out was performed and all information was confirmed to be correct.  Right Breast: The breast was infiltrated with tumescent solution to help with hemostasis.  The nipple was marked with a cookie cutter.  A superomedial pedicle was drawn out with the base of at least 8 cm in size.  A breast tourniquet was then applied and the pedicle was de-epithelialized.  Breast tourniquet was then let down and all incisions were made with a 10 blade.  The pedicle was then isolated down to the chest wall with cautery and the excision was performed removing tissue primarily inferiorly and laterally.  Hemostasis was obtained and the wound  was stapled closed.  Left breast:  The breast was infiltrated with tumescent solution to help with hemostasis.  The nipple was marked with a cookie cutter.  A superomedial pedicle was drawn out with the base of at least 8 cm in size.  A breast tourniquet was then applied and the pedicle was de-epithelialized.  Breast tourniquet was then let down and all incisions were made with a 10 blade.  The pedicle was then isolated down to the chest wall with cautery and the excision was performed removing tissue primarily inferiorly and laterally.  Hemostasis was obtained and the wound was stapled closed.  This resulted in a total of 878g removed from the right side and 850g removed from the left side.  The inframammary incision was closed with a combination of buried in-sorb staples and a running 3-0 Quill suture.  The vertical and periareolar limbs were closed with interrupted buried 4-0 Monocryl and a running 3-0 Quill suture.  Steri-Strips were then applied along with a soft dressing and Ace wrap.  The advanced practice practitioner (APP) assisted throughout the case.  The APP was essential in retraction and counter traction when needed to make the case progress smoothly.  This retraction and assistance made it possible to see the tissue plans for the procedure.  The assistance was needed for blood control, tissue re-approximation and assisted with closure of the incision site.  The patient tolerated the procedure well.  There were no complications. The patient was allowed to wake from anesthesia, extubated and taken to the recovery room in satisfactory condition.  I was present for the entire procedure.

## 2019-05-13 ENCOUNTER — Encounter (HOSPITAL_BASED_OUTPATIENT_CLINIC_OR_DEPARTMENT_OTHER): Payer: Self-pay | Admitting: Plastic Surgery

## 2019-05-13 LAB — SURGICAL PATHOLOGY

## 2019-05-20 ENCOUNTER — Encounter: Payer: Self-pay | Admitting: Plastic Surgery

## 2019-05-20 ENCOUNTER — Ambulatory Visit (INDEPENDENT_AMBULATORY_CARE_PROVIDER_SITE_OTHER): Payer: BC Managed Care – PPO | Admitting: Plastic Surgery

## 2019-05-20 ENCOUNTER — Other Ambulatory Visit: Payer: Self-pay

## 2019-05-20 VITALS — BP 125/86 | HR 94 | Temp 98.2°F | Ht 68.0 in | Wt 267.8 lb

## 2019-05-20 DIAGNOSIS — N62 Hypertrophy of breast: Secondary | ICD-10-CM

## 2019-05-20 NOTE — Progress Notes (Signed)
Patient is here 1 week postop from bilateral breast reduction.  There were no issues on her pathology.  She is overall really happy with how things of healed up.  She wants to go back to work this weekend doing nonstrenuous activities which is fine.  On exam everything looks like it is healing up fine.  There is some mild swelling and bruising but both areola are perfused and sensate.  She overall looks pretty symmetric.  She also wanted to discuss liposuction of the abdomen.  She has some excess intra-abdominal and extra-abdominal fat on the abdomen and flanks.  She has moderate skin excess in the infraumbilical area.  I explained that the O ultimate result would be from abdominoplasty combined with liposuction.  At the moment she is not interested in the downtime and scarring that abdominoplasty would generate and I think she would have benefit in the upper abdomen and flanks with liposuction alone.  I did explain that the intra-abdominal fat would not be addressed which is responsible for a good portion of the contour irregularities.  She is understanding of this and is interested in a quote for liposuction of the abdomen and flanks.  We did discuss the risk of liposuction that include bleeding, infection, damage to surrounding structures, persistent contour irregularities and asymmetries, and the potential for a slightly deflated appearance if it is overdone.  We will supply her with a quote and consider this after she is healed up more from her breast reduction.

## 2019-06-02 ENCOUNTER — Telehealth: Payer: Self-pay

## 2019-06-02 NOTE — Telephone Encounter (Signed)
Spoke with Carol Vargas. She states that yesterday she noticed that her right breast was tight and swollen. Reports its warm and tender to the touch. It woke her up because it was uncomfortable. She applied ice and got some relief although the discomfort returned once she removed the ice. Reports some bruising. Swelling is mostly on the side under the armpit area. Statest it looks like it's bulging under her arm and larger than the left breast. Denies fever, N/V, palpitations, redness, or drainage.   Recommend patient come into the office tomorrow for follow-up. Advised patient that she may ice the side of the breast, but to avoid the nipple area.

## 2019-06-02 NOTE — Telephone Encounter (Signed)
Patient called complaining of right breast pain. The breast is feeling hard and is warm touch. She has applied ice with minimal relief. She would like a call back to discuss.

## 2019-06-02 NOTE — Telephone Encounter (Signed)
Sounds good. Thanks 

## 2019-06-03 ENCOUNTER — Ambulatory Visit (INDEPENDENT_AMBULATORY_CARE_PROVIDER_SITE_OTHER): Payer: BC Managed Care – PPO | Admitting: Plastic Surgery

## 2019-06-03 ENCOUNTER — Other Ambulatory Visit: Payer: Self-pay

## 2019-06-03 ENCOUNTER — Encounter: Payer: Self-pay | Admitting: Plastic Surgery

## 2019-06-03 DIAGNOSIS — Z9889 Other specified postprocedural states: Secondary | ICD-10-CM | POA: Insufficient documentation

## 2019-06-03 NOTE — Progress Notes (Signed)
   Subjective:     Patient ID: Carol Vargas, female    DOB: 10-Jul-1975, 43 y.o.   MRN: [id number]  No chief complaint on file.   HPI: The patient is a 43 y.o. female here for follow-up after bilateral breast reduction on 11/03 with Dr. Claudia Desanctis.  States she has tightness, swelling, and pain of the right breast mostly on the lateral side for the last few days that is increasing. Reports its warm and tender to the touch. Got some relief from ice although discomfort returned once ice was removed.  Denies fever, N/V/ chest pain, redness, or drainage.  Overall is very pleased with her result.   Review of Systems  Constitutional: Negative for chills and fever.  HENT: Negative for sneezing and sore throat.   Respiratory: Negative for cough and shortness of breath.   Cardiovascular: Negative for chest pain.  Gastrointestinal: Negative for nausea and vomiting.  Skin: Negative for color change, pallor and rash.     Objective:   Vital Signs BP 140/90 (BP Location: Left Arm, Patient Position: Sitting, Cuff Size: Large)   Pulse 83   Temp 98.2 F (36.8 C) (Temporal)   Ht 5\' 8"  (1.727 m)   Wt 265 lb (120.2 kg)   LMP 01/06/2017 (Exact Date)   SpO2 100%   BMI 40.29 kg/m  Vital Signs and Nursing Note Reviewed  Physical Exam  Constitutional: She is oriented to person, place, and time and well-developed, well-nourished, and in no distress.  HENT:  Head: Normocephalic and atraumatic.  Eyes: EOM are normal.  Neck: Normal range of motion.  Cardiovascular: Normal rate.  Pulmonary/Chest: Effort normal. Right breast exhibits tenderness. Right breast exhibits no nipple discharge. Left breast exhibits no nipple discharge, no skin change and no tenderness.  Right breast swollen and firm (lateral, inferior) Visibly larger than left breast.  Incisions c/d/i. No signs of erythema, infection, drainage.  Musculoskeletal: Normal range of motion.  Neurological: She is alert and oriented to person, place, and  time. Gait normal.  Skin: Skin is warm and dry. No rash noted. No erythema. No pallor.  Psychiatric: Mood, memory, affect and judgment normal.      Assessment/Plan:     ICD-10-CM   1. Status post bilateral breast reduction  Z98.890     Ms. Roccia presents today with concerns for tightness, swelling, and pain of her right breast. She is 3 weeks post-op from bilateral breast reduction with Dr. Claudia Desanctis. Right breast is firm, swollen, and tender to palpation (mostly lateral and inferior). Incisions are c/d/i, no signs of infection or drainage. Left breast is healing well. Incisions c/d/i, no signs of infection or drainage.  Denies F, CP, SOB.  110 cc of dark serosanguinous fluid aspirated from right breast using sterile technique. After aspiration she reports she feels much better, no other complaints.  Follow up in 2 weeks for revaluation. Call office with any questions/concerns or if condition worsens.  Threasa Heads, PA-C 06/03/2019, 10:08 AM

## 2019-06-17 ENCOUNTER — Other Ambulatory Visit: Payer: Self-pay

## 2019-06-17 ENCOUNTER — Ambulatory Visit (INDEPENDENT_AMBULATORY_CARE_PROVIDER_SITE_OTHER): Payer: BC Managed Care – PPO | Admitting: Plastic Surgery

## 2019-06-17 ENCOUNTER — Encounter: Payer: Self-pay | Admitting: Plastic Surgery

## 2019-06-17 VITALS — BP 134/91 | HR 91 | Temp 97.5°F | Ht 68.0 in | Wt 266.4 lb

## 2019-06-17 DIAGNOSIS — N62 Hypertrophy of breast: Secondary | ICD-10-CM

## 2019-06-17 NOTE — Progress Notes (Signed)
Patient is here postop for bilateral breast reduction.  She is feeling good about things at this point.  I did drain a large seroma from the right side at her last visit and she feels like that is improved significantly.  She still feels some fullness in that area but it is nowhere near the point that it was before.  She has a similar small area on the left side but it is not all that bothersome to her.  On exam I can palpate some small areas of firmness in the lateral aspect on both the left and the right I did try to aspirate the right side however I did not get any fluid out.  Overall she satisfied with her progress and has no other wound healing or incision breakdown.  I suggested she gradually increase her activity according to her comfort.  I will plan to see her again in around 3 months to make sure everything is still doing fine and she can cancel that appointment if she feels she does not need it.

## 2019-07-10 DIAGNOSIS — K8020 Calculus of gallbladder without cholecystitis without obstruction: Secondary | ICD-10-CM

## 2019-07-10 HISTORY — DX: Calculus of gallbladder without cholecystitis without obstruction: K80.20

## 2019-08-10 DIAGNOSIS — Z79899 Other long term (current) drug therapy: Secondary | ICD-10-CM | POA: Diagnosis not present

## 2019-08-10 DIAGNOSIS — M62838 Other muscle spasm: Secondary | ICD-10-CM | POA: Diagnosis not present

## 2019-08-10 DIAGNOSIS — M48061 Spinal stenosis, lumbar region without neurogenic claudication: Secondary | ICD-10-CM | POA: Diagnosis not present

## 2019-08-10 DIAGNOSIS — I10 Essential (primary) hypertension: Secondary | ICD-10-CM | POA: Diagnosis not present

## 2019-08-10 DIAGNOSIS — R3989 Other symptoms and signs involving the genitourinary system: Secondary | ICD-10-CM | POA: Diagnosis not present

## 2019-08-10 DIAGNOSIS — R945 Abnormal results of liver function studies: Secondary | ICD-10-CM | POA: Diagnosis not present

## 2019-08-10 DIAGNOSIS — M5126 Other intervertebral disc displacement, lumbar region: Secondary | ICD-10-CM | POA: Diagnosis not present

## 2019-08-10 DIAGNOSIS — M25512 Pain in left shoulder: Secondary | ICD-10-CM | POA: Diagnosis not present

## 2019-08-10 DIAGNOSIS — R7303 Prediabetes: Secondary | ICD-10-CM | POA: Diagnosis not present

## 2019-08-10 DIAGNOSIS — Z1159 Encounter for screening for other viral diseases: Secondary | ICD-10-CM | POA: Diagnosis not present

## 2019-08-19 ENCOUNTER — Ambulatory Visit: Payer: BC Managed Care – PPO | Admitting: Plastic Surgery

## 2019-10-01 DIAGNOSIS — Z79899 Other long term (current) drug therapy: Secondary | ICD-10-CM | POA: Diagnosis not present

## 2019-10-01 DIAGNOSIS — M545 Low back pain: Secondary | ICD-10-CM | POA: Diagnosis not present

## 2019-10-01 DIAGNOSIS — M5126 Other intervertebral disc displacement, lumbar region: Secondary | ICD-10-CM | POA: Diagnosis not present

## 2019-10-01 DIAGNOSIS — M25512 Pain in left shoulder: Secondary | ICD-10-CM | POA: Diagnosis not present

## 2019-10-01 DIAGNOSIS — M5416 Radiculopathy, lumbar region: Secondary | ICD-10-CM | POA: Diagnosis not present

## 2019-10-01 DIAGNOSIS — Z6841 Body Mass Index (BMI) 40.0 and over, adult: Secondary | ICD-10-CM | POA: Diagnosis not present

## 2019-10-01 DIAGNOSIS — M48061 Spinal stenosis, lumbar region without neurogenic claudication: Secondary | ICD-10-CM | POA: Diagnosis not present

## 2019-10-01 DIAGNOSIS — M62838 Other muscle spasm: Secondary | ICD-10-CM | POA: Diagnosis not present

## 2019-10-09 DIAGNOSIS — K080 Exfoliation of teeth due to systemic causes: Secondary | ICD-10-CM | POA: Diagnosis not present

## 2019-10-22 DIAGNOSIS — M5416 Radiculopathy, lumbar region: Secondary | ICD-10-CM | POA: Diagnosis not present

## 2019-10-23 DIAGNOSIS — Z0289 Encounter for other administrative examinations: Secondary | ICD-10-CM | POA: Diagnosis not present

## 2019-10-23 DIAGNOSIS — R400 Somnolence: Secondary | ICD-10-CM | POA: Diagnosis not present

## 2019-11-09 DIAGNOSIS — M48061 Spinal stenosis, lumbar region without neurogenic claudication: Secondary | ICD-10-CM | POA: Diagnosis not present

## 2019-11-09 DIAGNOSIS — M25512 Pain in left shoulder: Secondary | ICD-10-CM | POA: Diagnosis not present

## 2019-11-09 DIAGNOSIS — M5126 Other intervertebral disc displacement, lumbar region: Secondary | ICD-10-CM | POA: Diagnosis not present

## 2019-11-09 DIAGNOSIS — M62838 Other muscle spasm: Secondary | ICD-10-CM | POA: Diagnosis not present

## 2019-11-09 DIAGNOSIS — Z79899 Other long term (current) drug therapy: Secondary | ICD-10-CM | POA: Diagnosis not present

## 2019-11-25 DIAGNOSIS — E7800 Pure hypercholesterolemia, unspecified: Secondary | ICD-10-CM | POA: Diagnosis not present

## 2019-11-25 DIAGNOSIS — Z20822 Contact with and (suspected) exposure to covid-19: Secondary | ICD-10-CM | POA: Diagnosis not present

## 2019-11-25 DIAGNOSIS — E559 Vitamin D deficiency, unspecified: Secondary | ICD-10-CM | POA: Diagnosis not present

## 2019-11-25 DIAGNOSIS — M129 Arthropathy, unspecified: Secondary | ICD-10-CM | POA: Diagnosis not present

## 2019-11-25 DIAGNOSIS — Z79899 Other long term (current) drug therapy: Secondary | ICD-10-CM | POA: Diagnosis not present

## 2019-11-25 DIAGNOSIS — R600 Localized edema: Secondary | ICD-10-CM | POA: Diagnosis not present

## 2019-11-25 DIAGNOSIS — D539 Nutritional anemia, unspecified: Secondary | ICD-10-CM | POA: Diagnosis not present

## 2019-11-25 DIAGNOSIS — R5383 Other fatigue: Secondary | ICD-10-CM | POA: Diagnosis not present

## 2019-11-25 DIAGNOSIS — R7303 Prediabetes: Secondary | ICD-10-CM | POA: Diagnosis not present

## 2019-11-25 DIAGNOSIS — I10 Essential (primary) hypertension: Secondary | ICD-10-CM | POA: Diagnosis not present

## 2019-11-25 LAB — IRON,TIBC AND FERRITIN PANEL
Ferritin: 49.1
TIBC: 410
UIBC: 353

## 2019-11-25 LAB — CBC AND DIFFERENTIAL
HCT: 44 (ref 36–46)
Hemoglobin: 14.4 (ref 12.0–16.0)
Platelets: 267 (ref 150–399)
WBC: 6

## 2019-11-25 LAB — LIPID PANEL
Cholesterol: 227 — AB (ref 0–200)
HDL: 65 (ref 35–70)
LDL Cholesterol: 126
Triglycerides: 180 — AB (ref 40–160)

## 2019-11-25 LAB — POCT ERYTHROCYTE SEDIMENTATION RATE, NON-AUTOMATED: Sed Rate: 39

## 2019-11-25 LAB — TSH: TSH: 0.78 (ref 0.41–5.90)

## 2019-11-25 LAB — CBC: RBC: 4.7 (ref 3.87–5.11)

## 2019-11-25 LAB — HEMOGLOBIN A1C: Hemoglobin A1C: 5.7

## 2019-11-25 LAB — HEPATIC FUNCTION PANEL: Bilirubin, Total: 0.3

## 2019-11-30 ENCOUNTER — Other Ambulatory Visit: Payer: Self-pay

## 2019-11-30 ENCOUNTER — Ambulatory Visit (INDEPENDENT_AMBULATORY_CARE_PROVIDER_SITE_OTHER): Payer: BC Managed Care – PPO | Admitting: Family Medicine

## 2019-11-30 ENCOUNTER — Encounter (INDEPENDENT_AMBULATORY_CARE_PROVIDER_SITE_OTHER): Payer: Self-pay | Admitting: Family Medicine

## 2019-11-30 VITALS — BP 118/84 | HR 76 | Temp 97.9°F | Ht 67.0 in | Wt 261.0 lb

## 2019-11-30 DIAGNOSIS — E7849 Other hyperlipidemia: Secondary | ICD-10-CM | POA: Diagnosis not present

## 2019-11-30 DIAGNOSIS — R0602 Shortness of breath: Secondary | ICD-10-CM | POA: Diagnosis not present

## 2019-11-30 DIAGNOSIS — Z9189 Other specified personal risk factors, not elsewhere classified: Secondary | ICD-10-CM

## 2019-11-30 DIAGNOSIS — I10 Essential (primary) hypertension: Secondary | ICD-10-CM | POA: Diagnosis not present

## 2019-11-30 DIAGNOSIS — E559 Vitamin D deficiency, unspecified: Secondary | ICD-10-CM | POA: Diagnosis not present

## 2019-11-30 DIAGNOSIS — R5383 Other fatigue: Secondary | ICD-10-CM

## 2019-11-30 DIAGNOSIS — Z1331 Encounter for screening for depression: Secondary | ICD-10-CM | POA: Diagnosis not present

## 2019-11-30 DIAGNOSIS — Z6841 Body Mass Index (BMI) 40.0 and over, adult: Secondary | ICD-10-CM

## 2019-11-30 DIAGNOSIS — Z0289 Encounter for other administrative examinations: Secondary | ICD-10-CM

## 2019-11-30 NOTE — Progress Notes (Signed)
Dear Dr. Rolena Vargas,   Thank you for referring Carol Vargas to our clinic. The following note includes my evaluation and treatment recommendations.  Chief Complaint:   OBESITY Carol Vargas (MR# [id number]) is a 44 y.o. female who presents for evaluation and treatment of obesity and related comorbidities. Current BMI is Body mass index is 40.88 kg/m. Carol Vargas has been struggling with her weight for many years and has been unsuccessful in either losing weight, maintaining weight loss, or reaching her healthy weight goal.  Carol Vargas is currently in the action stage of change and ready to dedicate time achieving and maintaining a healthier weight. Carol Vargas is interested in becoming our patient and working on intensive lifestyle modifications including (but not limited to) diet and exercise for weight loss.  Carol Vargas previously tried phentermine-Topamax.  She has history of preterm pre-eclampsia x2.  For breakfast, coffee with 3-4 creamer and Sweet-N-Low (4), slice of cake while breakfast is cooking, sausage or bacon (3-4), 2 pieces of toast, 2 eggs with cheese (feel full). One to two hours later she will have a bowl of cereal (Special K or Frosted Flakes - 3 cups) with whole milk.  She will usually want something sweet after this.  Two hours later, she will have a granola bar or fruit snacks (craving).  Dinner consists of 7-8 ounces of chicken, 1 cup of green beans, and mashed potatoes.  She is also eating Ramen noodles or chips around midnight (sometimes hungry).  Carol Vargas's habits were reviewed today and are as follows: Her family eats meals together, she thinks her family will eat healthier with her, she started gaining weight in her 79s, her heaviest weight ever was 266 pounds, she craves pasta and soda, she snacks frequently in the evenings, she wakes up frequently in the middle of the night to eat, she is frequently drinking liquids with calories, she has problems with excessive hunger, she  frequently eats larger portions than normal and she struggles with emotional eating.  Depression Screen Carol Vargas's Food and Mood (modified PHQ-9) score was 8.  Depression screen [facility] 2/9 11/30/2019  Decreased Interest 2  Down, Depressed, Hopeless 1  PHQ - 2 Score 3  Altered sleeping 1  Tired, decreased energy 3  Change in appetite 1  Feeling bad or failure about yourself  0  Trouble concentrating 0  Moving slowly or fidgety/restless 0  Suicidal thoughts 0  PHQ-9 Score 8  Difficult doing work/chores Somewhat difficult   Subjective:   1. Other fatigue Carol Vargas admits to daytime somnolence and reports waking up still tired. Patent has a history of symptoms of daytime fatigue and morning fatigue. Carol Vargas generally gets 5 or 6 hours of sleep per night, and states that she has poor quality sleep. Snoring is not present. Apneic episodes are not present. Epworth Sleepiness Score is 18.  2. SOB (shortness of breath) on exertion Carol Vargas does not feel that she gets out of breath more easily that she used to when she exercises. Carol Vargas's shortness of breath appears to be obesity related and exercise induced. She has agreed to work on weight loss and gradually increase exercise to treat her exercise induced shortness of breath. Will continue to monitor closely.  3. Essential hypertension Review: taking medications as instructed, no medication side effects noted, no chest pain on exertion, no dyspnea on exertion, no swelling of ankles.  Blood pressure is controlled today.  HCTZ was recently added to her regimen.  She was diagnosed in 2009 (pregnancy with pre-eclampsia).  BP  Readings from Last 3 Encounters:  11/30/19 118/84  06/17/19 (!) 134/91  06/03/19 140/90   4. Other hyperlipidemia Carol Vargas has hyperlipidemia and has been trying to improve her cholesterol levels with intensive lifestyle modification including a low saturated fat diet, exercise and weight loss. She denies any chest pain,  claudication or myalgias.  She is taking fish oil.  Her last set of labs showed an elevated HDL.  Lab Results  Component Value Date   ALT 29 01/24/2017   AST 26 01/24/2017   ALKPHOS 65 01/24/2017   BILITOT 0.4 01/24/2017   Lab Results  Component Value Date   CHOL 160 09/27/2016   HDL 49 09/27/2016   LDLCALC 84 09/27/2016   TRIG 134 09/27/2016   CHOLHDL 3.3 09/27/2016   5. Vitamin D deficiency Carol Vargas's last vitamin D level is unknown.  She is currently taking no vitamin D supplement.  6. Depression screening Carol Vargas was screened for depression as part of her new patient workup.  7. At risk for osteoporosis Carol Vargas is at higher risk of osteopenia and osteoporosis due to Vitamin D deficiency.   Assessment/Plan:   1. Other fatigue Carol Vargas does not feel that her weight is causing her energy to be lower than it should be. Fatigue may be related to obesity, depression or many other causes. Labs will be ordered, and in the meanwhile, Carol Vargas will focus on self care including making healthy food choices, increasing physical activity and focusing on stress reduction. - EKG 12-Lead - Vitamin B12 - Folate - T3 - T4, free - TSH  2. SOB (shortness of breath) on exertion Carol Vargas does not feel that she gets out of breath more easily that she used to when she exercises. Carol Vargas's shortness of breath appears to be obesity related and exercise induced. She has agreed to work on weight loss and gradually increase exercise to treat her exercise induced shortness of breath. Will continue to monitor closely. - Vitamin B12 - Folate - T3 - T4, free - TSH  3. Essential hypertension Carol Vargas is working on healthy weight loss and exercise to improve blood pressure control. We will watch for signs of hypotension as she continues her lifestyle modifications. - Comprehensive metabolic panel  4. Other hyperlipidemia Cardiovascular risk and specific lipid/LDL goals reviewed.  We discussed  several lifestyle modifications today and Carol Vargas will continue to work on diet, exercise and weight loss efforts. Orders and follow up as documented in patient record.   Counseling Intensive lifestyle modifications are the first line treatment for this issue. . Dietary changes: Increase soluble fiber. Decrease simple carbohydrates. . Exercise changes: Moderate to vigorous-intensity aerobic activity 150 minutes per week if tolerated. . Lipid-lowering medications: see documented in medical record. - Insulin, random  5. Vitamin D deficiency Low Vitamin D level contributes to fatigue and are associated with obesity, breast, and colon cancer.  - VITAMIN D 25 Hydroxy (Vit-D Deficiency, Fractures) - Vitamin B12  6. Depression screening Carol Vargas had a positive depression screening. Depression is commonly associated with obesity and often results in emotional eating behaviors. We will monitor this closely and work on CBT to help improve the non-hunger eating patterns. Referral to Psychology may be required if no improvement is seen as she continues in our clinic.  7. At risk for osteoporosis Carol Vargas was given approximately 15 minutes of osteoporosis prevention counseling today. Carol Vargas is at risk for osteopenia and osteoporosis due to her Vitamin D deficiency. She was encouraged to take her Vitamin D and follow her higher  calcium diet and increase strengthening exercise to help strengthen her bones and decrease her risk of osteopenia and osteoporosis.  Repetitive spaced learning was employed today to elicit superior memory formation and behavioral change.  8. Class 3 severe obesity with serious comorbidity and body mass index (BMI) of 40.0 to 44.9 in adult, unspecified obesity type (HCC) Carol Vargas is currently in the action stage of change and her goal is to continue with weight loss efforts. I recommend Carol Vargas begin the structured treatment plan as follows:  She has agreed to the Category 3  Plan.  Exercise goals: No exercise has been prescribed at this time.   Behavioral modification strategies: increasing lean protein intake, meal planning and cooking strategies, keeping healthy foods in the home and planning for success.  She was informed of the importance of frequent follow-up visits to maximize her success with intensive lifestyle modifications for her multiple health conditions. She was informed we would discuss her lab results at her next visit unless there is a critical issue that needs to be addressed sooner. Carol Vargas agreed to keep her next visit at the agreed upon time to discuss these results.  Objective:   Blood pressure 118/84, pulse 76, temperature 97.9 F (36.6 C), temperature source Oral, height 5\' 7"  (1.702 m), weight 261 lb (118.4 kg), last menstrual period 01/06/2017, SpO2 98 %. Body mass index is 40.88 kg/m.  EKG: Normal sinus rhythm, rate 72 bpm (no change from EKG done 05/09/2019 and in 2018).  Indirect Calorimeter completed today shows a VO2 of 265 and a REE of 1844.  Her calculated basal metabolic rate is [id number] thus her basal metabolic rate is worse than expected.  General: Cooperative, alert, well developed, in no acute distress. HEENT: Conjunctivae and lids unremarkable. Cardiovascular: Regular rhythm.  Lungs: Normal work of breathing. Neurologic: No focal deficits.   Lab Results  Component Value Date   CREATININE 0.75 01/30/2017   BUN 10 01/30/2017   NA 138 01/30/2017   K 3.9 01/30/2017   CL 103 01/30/2017   CO2 28 01/30/2017   Lab Results  Component Value Date   ALT 29 01/24/2017   AST 26 01/24/2017   ALKPHOS 65 01/24/2017   BILITOT 0.4 01/24/2017   Lab Results  Component Value Date   TSH 0.563 09/27/2016   Lab Results  Component Value Date   CHOL 160 09/27/2016   HDL 49 09/27/2016   LDLCALC 84 09/27/2016   TRIG 134 09/27/2016   CHOLHDL 3.3 09/27/2016   Lab Results  Component Value Date   WBC 16.8 (H) 01/30/2017   HGB  12.6 01/30/2017   HCT 37.8 01/30/2017   MCV 92.9 01/30/2017   PLT 203 01/30/2017   Lab Results  Component Value Date   IRON 81 09/27/2016   TIBC 367 09/27/2016   FERRITIN 24 09/27/2016   Attestation Statements:   This is the patient's first visit at [facility]. The patient's NEW PATIENT PACKET was reviewed at length. Included in the packet: current and past health history, medications, allergies, ROS, gynecologic history (women only), surgical history, family history, social history, weight history, weight loss surgery history (for those that have had weight loss surgery), nutritional evaluation, mood and food questionnaire, PHQ9, Epworth questionnaire, sleep habits questionnaire, patient life and health improvement goals questionnaire. These will all be scanned into the patient's chart under media.   During the visit, I independently reviewed the patient's EKG, bioimpedance scale results, and indirect calorimeter results. I used this information to tailor  a meal plan for the patient that will help her to lose weight and will improve her obesity-related conditions going forward. I performed a medically necessary appropriate examination and/or evaluation. I discussed the assessment and treatment plan with the patient. The patient was provided an opportunity to ask questions and all were answered. The patient agreed with the plan and demonstrated an understanding of the instructions. Labs were ordered at this visit and will be reviewed at the next visit unless more critical results need to be addressed immediately. Clinical information was updated and documented in the EMR.   Time spent on visit including pre-visit chart review and post-visit care was 45 minutes.   A separate 15 minutes was spent on risk counseling (see above).    I, Water quality scientist, CMA, am acting as transcriptionist for Coralie Common, MD.  I have reviewed the above documentation for accuracy and completeness,  and I agree with the above. - Jinny Blossom, MD

## 2019-12-01 LAB — COMPREHENSIVE METABOLIC PANEL
ALT: 20 IU/L (ref 0–32)
AST: 20 IU/L (ref 0–40)
Albumin/Globulin Ratio: 1.4 (ref 1.2–2.2)
Albumin: 4.3 g/dL (ref 3.8–4.8)
Alkaline Phosphatase: 93 IU/L (ref 48–121)
BUN/Creatinine Ratio: 13 (ref 9–23)
BUN: 11 mg/dL (ref 6–24)
Bilirubin Total: 0.3 mg/dL (ref 0.0–1.2)
CO2: 28 mmol/L (ref 20–29)
Calcium: 9.2 mg/dL (ref 8.7–10.2)
Chloride: 98 mmol/L (ref 96–106)
Creatinine, Ser: 0.83 mg/dL (ref 0.57–1.00)
GFR calc Af Amer: 98 mL/min/{1.73_m2} (ref >59)
GFR calc non Af Amer: 85 mL/min/{1.73_m2} (ref >59)
Globulin, Total: 3.1 g/dL (ref 1.5–4.5)
Glucose: 85 mg/dL (ref 65–99)
Potassium: 3.2 mmol/L — ABNORMAL LOW (ref 3.5–5.2)
Sodium: 140 mmol/L (ref 134–144)
Total Protein: 7.4 g/dL (ref 6.0–8.5)

## 2019-12-01 LAB — FOLATE: Folate: 9.7 ng/mL (ref >3.0)

## 2019-12-01 LAB — T4, FREE: Free T4: 1.1 ng/dL (ref 0.82–1.77)

## 2019-12-01 LAB — TSH: TSH: 1.55 u[IU]/mL (ref 0.450–4.500)

## 2019-12-01 LAB — VITAMIN D 25 HYDROXY (VIT D DEFICIENCY, FRACTURES): Vit D, 25-Hydroxy: 41.1 ng/mL (ref 30.0–100.0)

## 2019-12-01 LAB — T3: T3, Total: 137 ng/dL (ref 71–180)

## 2019-12-01 LAB — INSULIN, RANDOM: INSULIN: 11.7 u[IU]/mL (ref 2.6–24.9)

## 2019-12-01 LAB — VITAMIN B12: Vitamin B-12: 1132 pg/mL (ref 232–1245)

## 2019-12-04 DIAGNOSIS — G4710 Hypersomnia, unspecified: Secondary | ICD-10-CM | POA: Diagnosis not present

## 2019-12-04 DIAGNOSIS — F329 Major depressive disorder, single episode, unspecified: Secondary | ICD-10-CM | POA: Diagnosis not present

## 2019-12-04 DIAGNOSIS — F411 Generalized anxiety disorder: Secondary | ICD-10-CM | POA: Diagnosis not present

## 2019-12-04 DIAGNOSIS — I10 Essential (primary) hypertension: Secondary | ICD-10-CM | POA: Diagnosis not present

## 2019-12-07 DIAGNOSIS — R0902 Hypoxemia: Secondary | ICD-10-CM | POA: Diagnosis not present

## 2019-12-07 DIAGNOSIS — R0683 Snoring: Secondary | ICD-10-CM | POA: Diagnosis not present

## 2019-12-08 ENCOUNTER — Encounter (INDEPENDENT_AMBULATORY_CARE_PROVIDER_SITE_OTHER): Payer: Self-pay | Admitting: Family Medicine

## 2019-12-08 DIAGNOSIS — E876 Hypokalemia: Secondary | ICD-10-CM

## 2019-12-09 ENCOUNTER — Other Ambulatory Visit (INDEPENDENT_AMBULATORY_CARE_PROVIDER_SITE_OTHER): Payer: Self-pay

## 2019-12-09 MED ORDER — POTASSIUM CHLORIDE CRYS ER 20 MEQ PO TBCR: 20.0000 meq | EXTENDED_RELEASE_TABLET | Freq: Every day | ORAL | 0 refills | Status: DC

## 2019-12-09 NOTE — Telephone Encounter (Signed)
Please advise 

## 2019-12-14 ENCOUNTER — Ambulatory Visit (INDEPENDENT_AMBULATORY_CARE_PROVIDER_SITE_OTHER): Payer: BC Managed Care – PPO | Admitting: Family Medicine

## 2019-12-14 ENCOUNTER — Encounter (INDEPENDENT_AMBULATORY_CARE_PROVIDER_SITE_OTHER): Payer: Self-pay | Admitting: Family Medicine

## 2019-12-14 ENCOUNTER — Other Ambulatory Visit: Payer: Self-pay

## 2019-12-14 VITALS — BP 121/85 | HR 92 | Temp 98.2°F | Ht 67.0 in | Wt 256.0 lb

## 2019-12-14 DIAGNOSIS — Z6841 Body Mass Index (BMI) 40.0 and over, adult: Secondary | ICD-10-CM

## 2019-12-14 DIAGNOSIS — R7303 Prediabetes: Secondary | ICD-10-CM

## 2019-12-14 DIAGNOSIS — E876 Hypokalemia: Secondary | ICD-10-CM

## 2019-12-14 DIAGNOSIS — E559 Vitamin D deficiency, unspecified: Secondary | ICD-10-CM

## 2019-12-14 DIAGNOSIS — I10 Essential (primary) hypertension: Secondary | ICD-10-CM

## 2019-12-14 MED ORDER — VITAMIN D (ERGOCALCIFEROL) 1.25 MG (50000 UNIT) PO CAPS: 50000.0000 [IU] | ORAL_CAPSULE | ORAL | 0 refills | Status: DC

## 2019-12-14 NOTE — Progress Notes (Signed)
Chief Complaint:   OBESITY Carol Vargas is here to discuss her progress with her obesity treatment plan along with follow-up of her obesity related diagnoses. Carol Vargas is on the Category 3 Plan and states she is following her eating plan approximately 70% of the time. Carol Vargas states she is biking 1/2 mile 3-4 times per week.  Today's visit was #: 2 Starting weight: 261 lbs Starting date: 11/30/2019 Today's weight: 256 lbs Today's date: 12/14/2019 Total lbs lost to date: 5 Total lbs lost since last in-office visit: 5  Interim History: Carol Vargas felt she started out on point everyday and breakfast and lunch and then would trail off at dinner. Initially she would use snack calories for soda, but would get hungry so she slowly trailed off on soda consumption. She is working a significant amount of 2nd shift. She states she often accidentally is eating lunch options for dinner.  Subjective:   Hypokalemia. Last potassium low at 3.2 on 11/30/2019. Carol Vargas had recent addition of KCl 20 mEq.  Prediabetes. Carol Vargas has a diagnosis of prediabetes based on her elevated HgA1c and was informed this puts her at greater risk of developing diabetes. She continues to work on diet and exercise to decrease her risk of diabetes. She denies nausea or hypoglycemia. Carol Vargas is on no medication.  Lab Results  Component Value Date   HGBA1C 5.7 11/25/2019   Lab Results  Component Value Date   INSULIN 11.7 11/30/2019   Vitamin D deficiency. Carol Vargas endorses fatigue. She is on no Vitamin D supplementation. Last Vitamin D 41.1 on 11/30/2019.  Essential hypertension. Blood pressure is well controlled today. No chest pain, chest pressure, or headache.   BP Readings from Last 3 Encounters:  12/14/19 121/85  11/30/19 118/84  06/17/19 (!) 134/91   Lab Results  Component Value Date   CREATININE 0.83 11/30/2019   CREATININE 0.75 01/30/2017   CREATININE 0.96 01/24/2017   Assessment/Plan:    Hypokalemia.  Basic metabolic panel will be drawn today.  Prediabetes. Carol Vargas will continue to work on weight loss, exercise, and decreasing simple carbohydrates to help decrease the risk of diabetes. She will have repeat labs in 3 months.  Vitamin D deficiency. Low Vitamin D level contributes to fatigue and are associated with obesity, breast, and colon cancer. She was given a prescription for Vitamin D, Ergocalciferol, (DRISDOL) 1.25 MG (50000 UNIT) CAPS capsule every week #4 with 0 refills and will follow-up for routine testing of Vitamin D, at least 2-3 times per year to avoid over-replacement.   Essential hypertension. Carol Vargas is working on healthy weight loss and exercise to improve blood pressure control. We will watch for signs of hypotension as she continues her lifestyle modifications. Carol Vargas will continue her current medication as directed with no refills needed.  Class 3 severe obesity with serious comorbidity and body mass index (BMI) of 40.0 to 44.9 in adult, unspecified obesity type ([place]).  Carol Vargas is currently in the action stage of change. As such, her goal is to continue with weight loss efforts. She has agreed to the Category 3 Plan.   Exercise goals: For substantial health benefits, adults should do at least 150 minutes (2 hours and 30 minutes) a week of moderate-intensity, or 75 minutes (1 hour and 15 minutes) a week of vigorous-intensity aerobic physical activity, or an equivalent combination of moderate- and vigorous-intensity aerobic activity. Aerobic activity should be performed in episodes of at least 10 minutes, and preferably, it should be spread throughout the week.  Behavioral  modification strategies: increasing lean protein intake, meal planning and cooking strategies and keeping healthy foods in the home.  Carol Vargas has agreed to follow-up with our clinic in 2 weeks. She was informed of the importance of frequent follow-up visits to maximize her success with  intensive lifestyle modifications for her multiple health conditions.   Objective:   Blood pressure 121/85, pulse 92, temperature 98.2 F (36.8 C), temperature source Oral, height 5\' 7"  (1.702 m), weight 256 lb (116.1 kg), last menstrual period 01/06/2017, SpO2 99 %. Body mass index is 40.1 kg/m.  General: Cooperative, alert, well developed, in no acute distress. HEENT: Conjunctivae and lids unremarkable. Cardiovascular: Regular rhythm.  Lungs: Normal work of breathing. Neurologic: No focal deficits.   Lab Results  Component Value Date   CREATININE 0.83 11/30/2019   BUN 11 11/30/2019   NA 140 11/30/2019   K 3.2 (L) 11/30/2019   CL 98 11/30/2019   CO2 28 11/30/2019   Lab Results  Component Value Date   ALT 20 11/30/2019   AST 20 11/30/2019   ALKPHOS 93 11/30/2019   BILITOT 0.3 11/30/2019   Lab Results  Component Value Date   HGBA1C 5.7 11/25/2019   Lab Results  Component Value Date   INSULIN 11.7 11/30/2019   Lab Results  Component Value Date   TSH 1.550 11/30/2019   Lab Results  Component Value Date   CHOL 227 (A) 11/25/2019   HDL 65 11/25/2019   LDLCALC 126 11/25/2019   TRIG 180 (A) 11/25/2019   CHOLHDL 3.3 09/27/2016   Lab Results  Component Value Date   WBC 6.0 11/25/2019   HGB 14.4 11/25/2019   HCT 44 11/25/2019   MCV 92.9 01/30/2017   PLT 267 11/25/2019   Lab Results  Component Value Date   IRON 81 09/27/2016   TIBC 410.0 11/25/2019   FERRITIN 49.10 11/25/2019   Attestation Statements:   Reviewed by clinician on day of visit: allergies, medications, problem list, medical history, surgical history, family history, social history, and previous encounter notes.  I, Michaelene Song, am acting as transcriptionist for Coralie Common, MD   I have reviewed the above documentation for accuracy and completeness, and I agree with the above. - Jinny Blossom, MD

## 2019-12-15 LAB — BASIC METABOLIC PANEL
BUN/Creatinine Ratio: 16 (ref 9–23)
BUN: 15 mg/dL (ref 6–24)
CO2: 27 mmol/L (ref 20–29)
Calcium: 10 mg/dL (ref 8.7–10.2)
Chloride: 103 mmol/L (ref 96–106)
Creatinine, Ser: 0.94 mg/dL (ref 0.57–1.00)
GFR calc Af Amer: 85 mL/min/{1.73_m2} (ref >59)
GFR calc non Af Amer: 74 mL/min/{1.73_m2} (ref >59)
Glucose: 111 mg/dL — ABNORMAL HIGH (ref 65–99)
Potassium: 3.2 mmol/L — ABNORMAL LOW (ref 3.5–5.2)
Sodium: 144 mmol/L (ref 134–144)

## 2019-12-20 DIAGNOSIS — M5126 Other intervertebral disc displacement, lumbar region: Secondary | ICD-10-CM | POA: Diagnosis not present

## 2019-12-20 DIAGNOSIS — M25512 Pain in left shoulder: Secondary | ICD-10-CM | POA: Diagnosis not present

## 2019-12-20 DIAGNOSIS — Z79899 Other long term (current) drug therapy: Secondary | ICD-10-CM | POA: Diagnosis not present

## 2019-12-20 DIAGNOSIS — M62838 Other muscle spasm: Secondary | ICD-10-CM | POA: Diagnosis not present

## 2019-12-20 DIAGNOSIS — M48061 Spinal stenosis, lumbar region without neurogenic claudication: Secondary | ICD-10-CM | POA: Diagnosis not present

## 2019-12-31 ENCOUNTER — Other Ambulatory Visit (INDEPENDENT_AMBULATORY_CARE_PROVIDER_SITE_OTHER): Payer: Self-pay | Admitting: Family Medicine

## 2019-12-31 DIAGNOSIS — E876 Hypokalemia: Secondary | ICD-10-CM

## 2020-01-12 DIAGNOSIS — R0683 Snoring: Secondary | ICD-10-CM | POA: Diagnosis not present

## 2020-01-12 DIAGNOSIS — G8929 Other chronic pain: Secondary | ICD-10-CM | POA: Diagnosis not present

## 2020-01-12 DIAGNOSIS — I10 Essential (primary) hypertension: Secondary | ICD-10-CM | POA: Diagnosis not present

## 2020-01-12 DIAGNOSIS — M545 Low back pain: Secondary | ICD-10-CM | POA: Diagnosis not present

## 2020-02-08 DIAGNOSIS — Z6841 Body Mass Index (BMI) 40.0 and over, adult: Secondary | ICD-10-CM | POA: Diagnosis not present

## 2020-02-08 DIAGNOSIS — I10 Essential (primary) hypertension: Secondary | ICD-10-CM | POA: Diagnosis not present

## 2020-03-23 DIAGNOSIS — R0602 Shortness of breath: Secondary | ICD-10-CM | POA: Diagnosis not present

## 2020-03-24 DIAGNOSIS — M5416 Radiculopathy, lumbar region: Secondary | ICD-10-CM | POA: Diagnosis not present

## 2020-03-28 DIAGNOSIS — Z9884 Bariatric surgery status: Secondary | ICD-10-CM | POA: Diagnosis not present

## 2020-03-28 DIAGNOSIS — K8020 Calculus of gallbladder without cholecystitis without obstruction: Secondary | ICD-10-CM | POA: Diagnosis not present

## 2020-03-28 DIAGNOSIS — K760 Fatty (change of) liver, not elsewhere classified: Secondary | ICD-10-CM | POA: Diagnosis not present

## 2020-03-28 DIAGNOSIS — Z01818 Encounter for other preprocedural examination: Secondary | ICD-10-CM | POA: Diagnosis not present

## 2020-04-04 DIAGNOSIS — I10 Essential (primary) hypertension: Secondary | ICD-10-CM | POA: Diagnosis not present

## 2020-04-04 DIAGNOSIS — Z6841 Body Mass Index (BMI) 40.0 and over, adult: Secondary | ICD-10-CM | POA: Diagnosis not present

## 2020-04-04 DIAGNOSIS — E785 Hyperlipidemia, unspecified: Secondary | ICD-10-CM | POA: Diagnosis not present

## 2020-04-08 DIAGNOSIS — Z713 Dietary counseling and surveillance: Secondary | ICD-10-CM | POA: Diagnosis not present

## 2020-04-15 DIAGNOSIS — M5416 Radiculopathy, lumbar region: Secondary | ICD-10-CM | POA: Diagnosis not present

## 2020-04-28 DIAGNOSIS — M5416 Radiculopathy, lumbar region: Secondary | ICD-10-CM | POA: Diagnosis not present

## 2020-05-05 DIAGNOSIS — Z01818 Encounter for other preprocedural examination: Secondary | ICD-10-CM | POA: Diagnosis not present

## 2020-05-12 ENCOUNTER — Other Ambulatory Visit (HOSPITAL_COMMUNITY): Payer: Self-pay | Admitting: Student

## 2020-05-12 DIAGNOSIS — M5416 Radiculopathy, lumbar region: Secondary | ICD-10-CM

## 2020-05-25 ENCOUNTER — Ambulatory Visit (HOSPITAL_COMMUNITY)
Admission: RE | Admit: 2020-05-25 | Discharge: 2020-05-25 | Disposition: A | Discharge status: Home / Self Care | Payer: BC Managed Care – PPO | Source: Ambulatory Visit | Attending: Student | Admitting: Student

## 2020-05-25 ENCOUNTER — Other Ambulatory Visit: Payer: Self-pay

## 2020-05-25 DIAGNOSIS — M5416 Radiculopathy, lumbar region: Secondary | ICD-10-CM | POA: Insufficient documentation

## 2020-05-25 DIAGNOSIS — M545 Low back pain, unspecified: Secondary | ICD-10-CM | POA: Diagnosis not present

## 2020-05-25 IMAGING — MR MR LUMBAR SPINE W/O CM
5 series · 31 of 48 positions shown · non-contrast
Comparison: Lumbar MRI 01/14/2019

CLINICAL DATA: Low back pain with left leg pain.

EXAM:
MRI LUMBAR SPINE WITHOUT CONTRAST
TECHNIQUE: Multiplanar, multisequence MR imaging of the lumbar spine was
performed. No intravenous contrast was administered.

[Series 5: T2 · sagittal · 4.0mm · 0.68mm/px · 6 of 15 slices shown (1 of 2)]
[im 1/15]
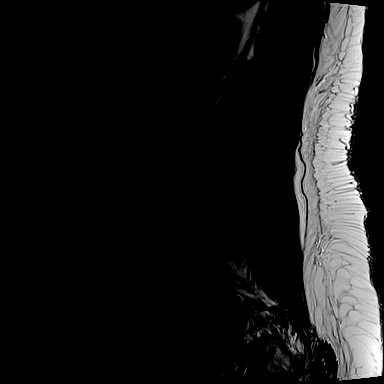
[im 3/15]
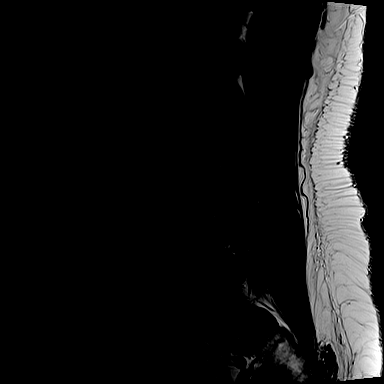
[im 6/15]
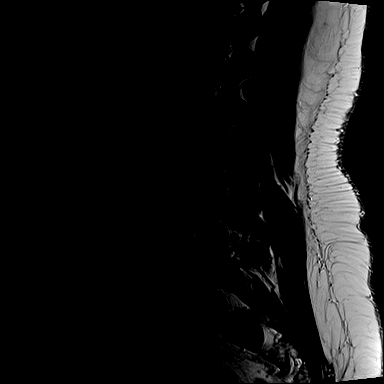
[im 9/15]
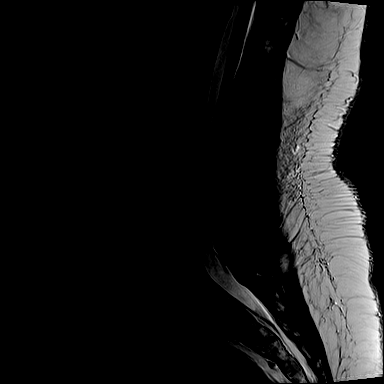
[im 12/15]
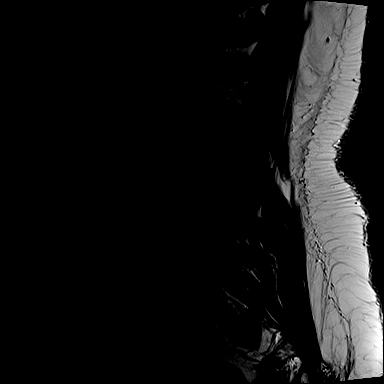
[im 15/15]
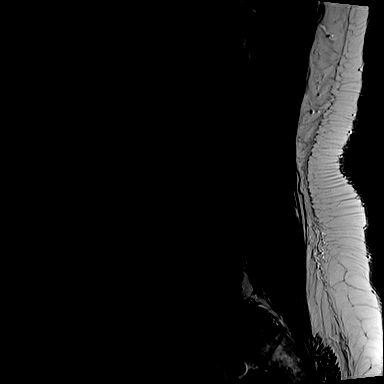

[Series 6: T1 · sagittal · 4.0mm · 0.81mm/px · 7 of 15 slices shown (1 of 2)]
[im 1/15]
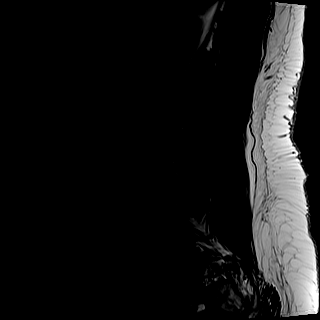
[im 3/15]
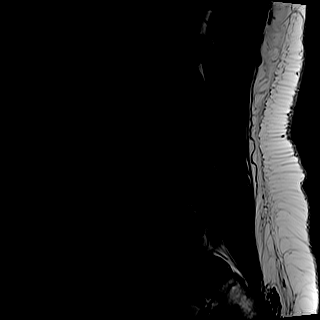
[im 5/15]
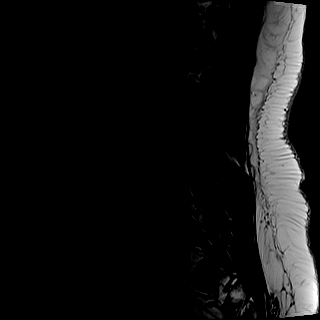
[im 8/15]
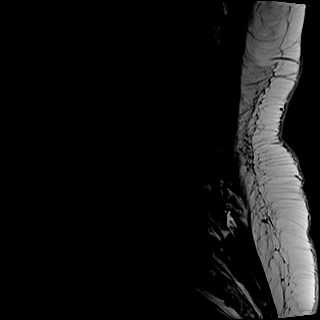
[im 10/15]
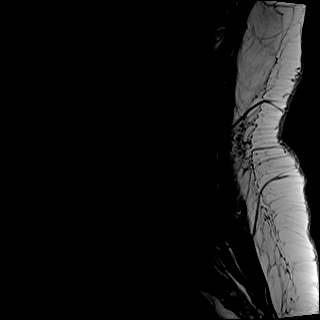
[im 12/15]
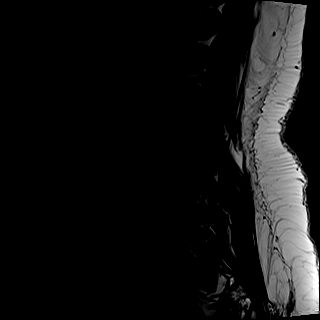
[im 15/15]
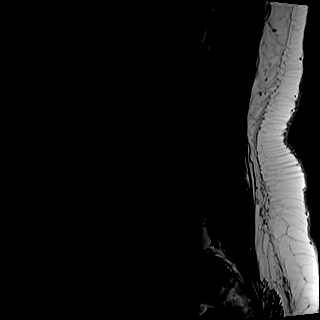

[Series 7: STIR · sagittal · 4.0mm · 0.51mm/px · 2 of 15 slices shown]
[im 1/15]
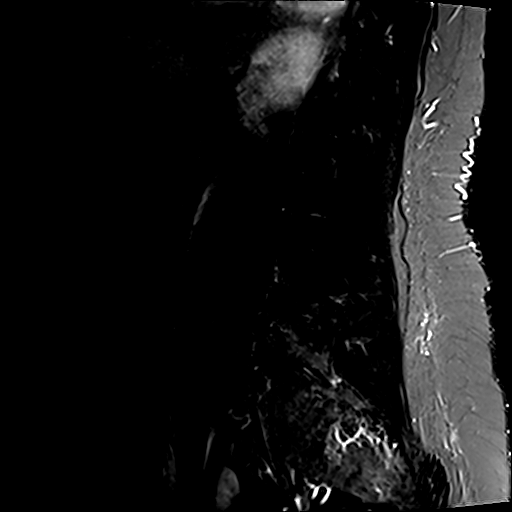
[im 3/15]
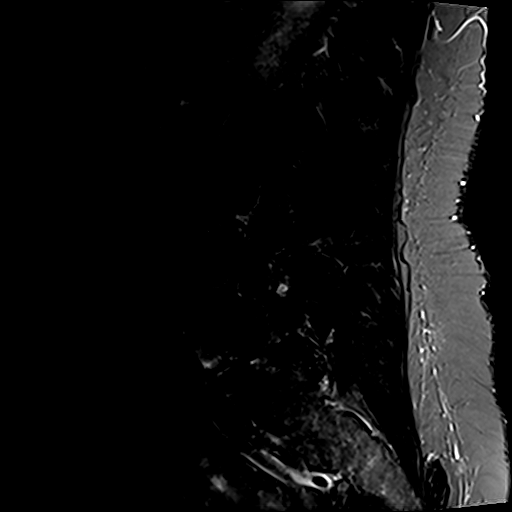

[Series 8: T2 · axial · 4.0mm · 0.70mm/px · z∈[-124,+67]mm · 8 of 32 slices shown (2 of 2)]
[im 1/32]
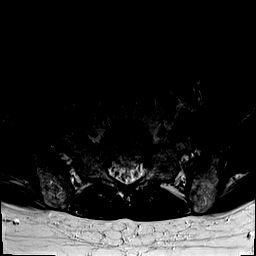
[im 5/32]
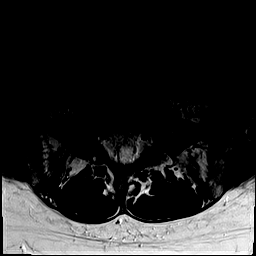
[im 10/32]
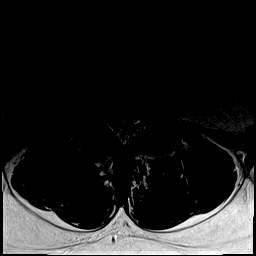
[im 15/32]
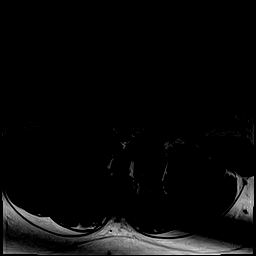
[im 17/32]
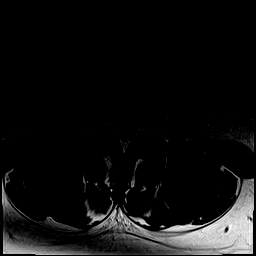
[im 22/32]
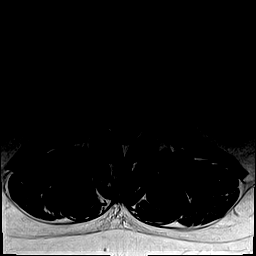
[im 27/32]
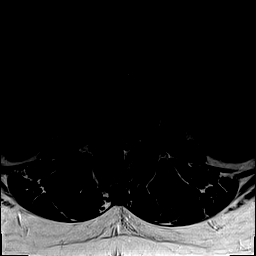
[im 32/32]
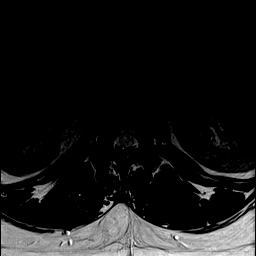

[Series 9: T1 · axial · 4.0mm · 0.35mm/px · z∈[-124,+67]mm · 8 of 32 slices shown (2 of 2)]
[im 1/32]
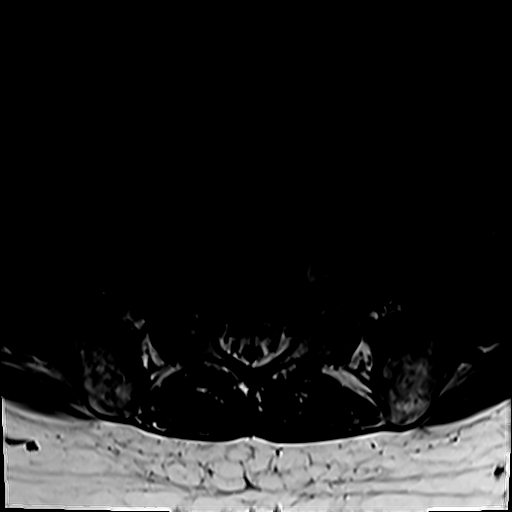
[im 5/32]
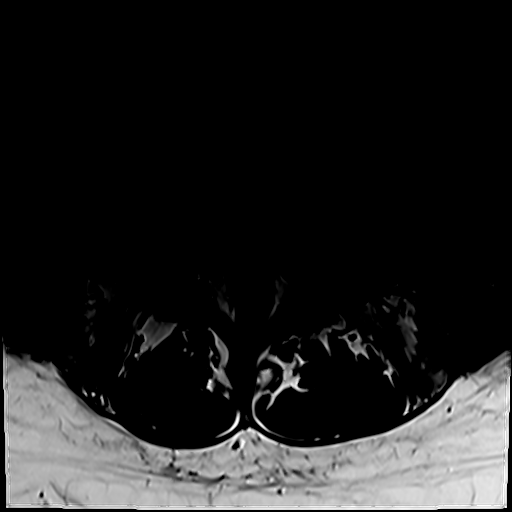
[im 10/32]
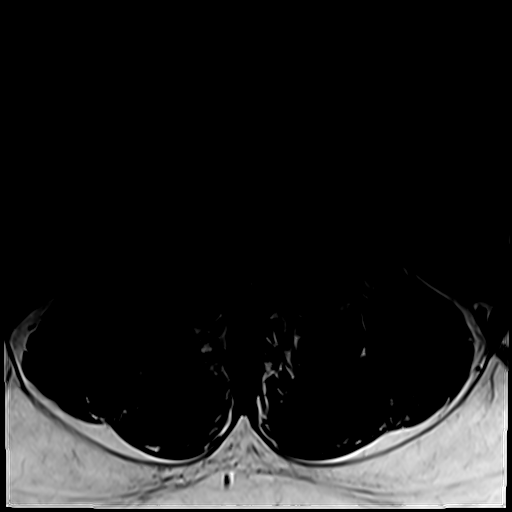
[im 15/32]
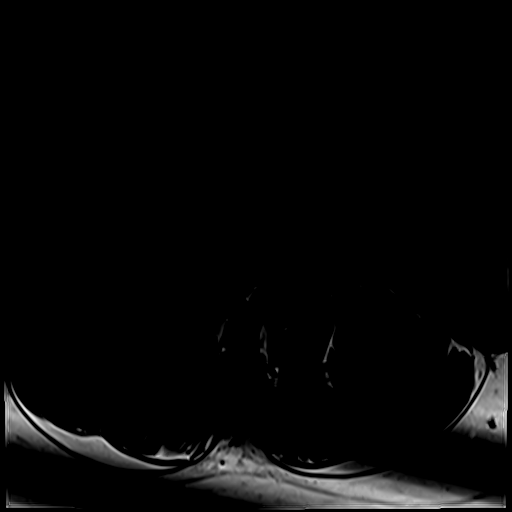
[im 17/32]
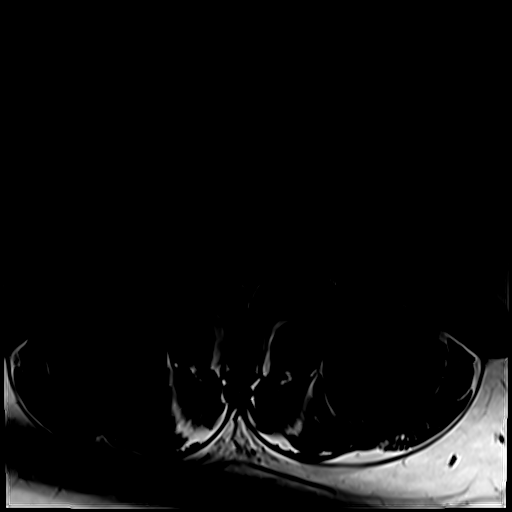
[im 22/32]
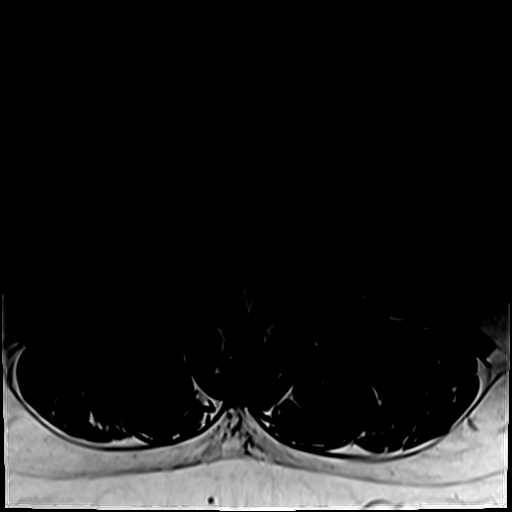
[im 27/32]
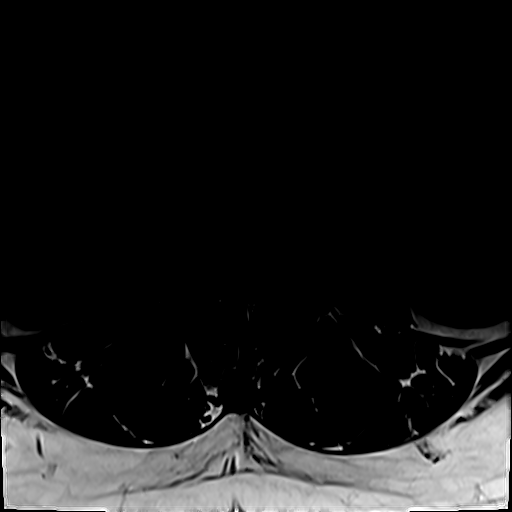
[im 32/32]
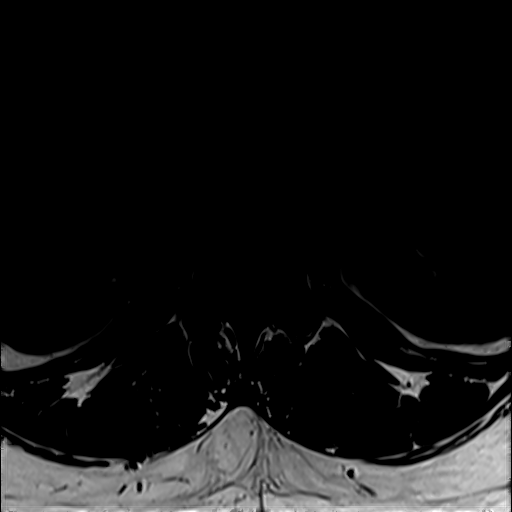

[31 of 48 positions shown; findings below may reference images not displayed]

FINDINGS: Segmentation:  Normal

Alignment:  Normal

Vertebrae:  Normal bone marrow.  Negative for fracture or mass.

Conus medullaris and cauda equina: Conus extends to the L1 level.
Conus and cauda equina appear normal.

Paraspinal and other soft tissues: Negative for paraspinous mass,
adenopathy, or fluid collection.

Disc levels:

L1-2: Negative

L2-3: Negative

L3-4: Mild disc and mild facet degeneration. Negative for spinal or
foraminal stenosis

L4-5: Mild disc degeneration. Mild facet degeneration right greater
than left. Mild subarticular stenosis on the right unchanged.

L5-S1: Moderate left foraminal encroachment due to facet hypertrophy
and left foraminal disc protrusion. Left L5 nerve root impingement
unchanged from the prior study. Mild right foraminal narrowing.
IMPRESSION: Mild right-sided narrowing at L4-5 and L5-S1 unchanged

Moderate left foraminal narrowing L5-S1 due to disc protrusion and
spurring. No change from the prior MRI.

## 2020-05-26 DIAGNOSIS — I10 Essential (primary) hypertension: Secondary | ICD-10-CM | POA: Diagnosis not present

## 2020-05-26 DIAGNOSIS — E7800 Pure hypercholesterolemia, unspecified: Secondary | ICD-10-CM | POA: Diagnosis not present

## 2020-05-26 DIAGNOSIS — E669 Obesity, unspecified: Secondary | ICD-10-CM | POA: Diagnosis not present

## 2020-05-26 DIAGNOSIS — Z01818 Encounter for other preprocedural examination: Secondary | ICD-10-CM | POA: Diagnosis not present

## 2020-05-31 DIAGNOSIS — Z6841 Body Mass Index (BMI) 40.0 and over, adult: Secondary | ICD-10-CM | POA: Diagnosis not present

## 2020-05-31 DIAGNOSIS — M5416 Radiculopathy, lumbar region: Secondary | ICD-10-CM | POA: Diagnosis not present

## 2020-05-31 DIAGNOSIS — I10 Essential (primary) hypertension: Secondary | ICD-10-CM | POA: Diagnosis not present

## 2020-06-06 ENCOUNTER — Other Ambulatory Visit: Payer: Self-pay | Admitting: Neurological Surgery

## 2020-06-13 DIAGNOSIS — Z01810 Encounter for preprocedural cardiovascular examination: Secondary | ICD-10-CM | POA: Diagnosis not present

## 2020-06-27 ENCOUNTER — Encounter (HOSPITAL_COMMUNITY): Payer: Self-pay

## 2020-06-27 ENCOUNTER — Other Ambulatory Visit: Payer: Self-pay

## 2020-06-27 ENCOUNTER — Ambulatory Visit (HOSPITAL_COMMUNITY)
Admission: RE | Admit: 2020-06-27 | Discharge: 2020-06-27 | Disposition: A | Discharge status: Home / Self Care | Payer: BC Managed Care – PPO | Source: Ambulatory Visit | Attending: Neurological Surgery | Admitting: Neurological Surgery

## 2020-06-27 ENCOUNTER — Encounter (HOSPITAL_COMMUNITY)
Admission: RE | Admit: 2020-06-27 | Discharge: 2020-06-27 | Disposition: A | Discharge status: Home / Self Care | Payer: BC Managed Care – PPO | Source: Ambulatory Visit | Attending: Neurological Surgery | Admitting: Neurological Surgery

## 2020-06-27 DIAGNOSIS — M4310 Spondylolisthesis, site unspecified: Secondary | ICD-10-CM | POA: Diagnosis not present

## 2020-06-27 DIAGNOSIS — Z01818 Encounter for other preprocedural examination: Secondary | ICD-10-CM | POA: Diagnosis not present

## 2020-06-27 DIAGNOSIS — M48061 Spinal stenosis, lumbar region without neurogenic claudication: Secondary | ICD-10-CM | POA: Insufficient documentation

## 2020-06-27 LAB — CBC WITH DIFFERENTIAL/PLATELET
Abs Immature Granulocytes: 0.02 10*3/uL (ref 0.00–0.07)
Basophils Absolute: 0.1 10*3/uL (ref 0.0–0.1)
Basophils Relative: 1 %
Eosinophils Absolute: 0.1 10*3/uL (ref 0.0–0.5)
Eosinophils Relative: 2 %
HCT: 42.2 % (ref 36.0–46.0)
Hemoglobin: 13.5 g/dL (ref 12.0–15.0)
Immature Granulocytes: 0 %
Lymphocytes Relative: 37 %
Lymphs Abs: 2.7 10*3/uL (ref 0.7–4.0)
MCH: 29.5 pg (ref 26.0–34.0)
MCHC: 32 g/dL (ref 30.0–36.0)
MCV: 92.3 fL (ref 80.0–100.0)
Monocytes Absolute: 0.5 10*3/uL (ref 0.1–1.0)
Monocytes Relative: 7 %
Neutro Abs: 3.9 10*3/uL (ref 1.7–7.7)
Neutrophils Relative %: 53 %
Platelets: 230 10*3/uL (ref 150–400)
RBC: 4.57 MIL/uL (ref 3.87–5.11)
RDW: 12.7 % (ref 11.5–15.5)
WBC: 7.4 10*3/uL (ref 4.0–10.5)
nRBC: 0 % (ref 0.0–0.2)

## 2020-06-27 LAB — BASIC METABOLIC PANEL
Anion gap: 11 (ref 5–15)
BUN: 10 mg/dL (ref 6–20)
CO2: 26 mmol/L (ref 22–32)
Calcium: 9.1 mg/dL (ref 8.9–10.3)
Chloride: 104 mmol/L (ref 98–111)
Creatinine, Ser: 0.83 mg/dL (ref 0.44–1.00)
GFR, Estimated: >60 mL/min (ref >60)
Glucose, Bld: 117 mg/dL — ABNORMAL HIGH (ref 70–99)
Potassium: 3.2 mmol/L — ABNORMAL LOW (ref 3.5–5.1)
Sodium: 141 mmol/L (ref 135–145)

## 2020-06-27 LAB — PROTIME-INR
INR: 0.9 (ref 0.8–1.2)
Prothrombin Time: 12.1 seconds (ref 11.4–15.2)

## 2020-06-27 LAB — TYPE AND SCREEN
ABO/RH(D): O POS
Antibody Screen: NEGATIVE

## 2020-06-27 LAB — SURGICAL PCR SCREEN
MRSA, PCR: NEGATIVE
Staphylococcus aureus: NEGATIVE

## 2020-06-27 IMAGING — CR DG CHEST 2V
2 series · 2 of 2 positions shown · non-contrast
Comparison: Radiograph 12/24/2014

CLINICAL DATA: Preoperative, no chest complaint

EXAM:
CHEST - 2 VIEW

[w chest pa]
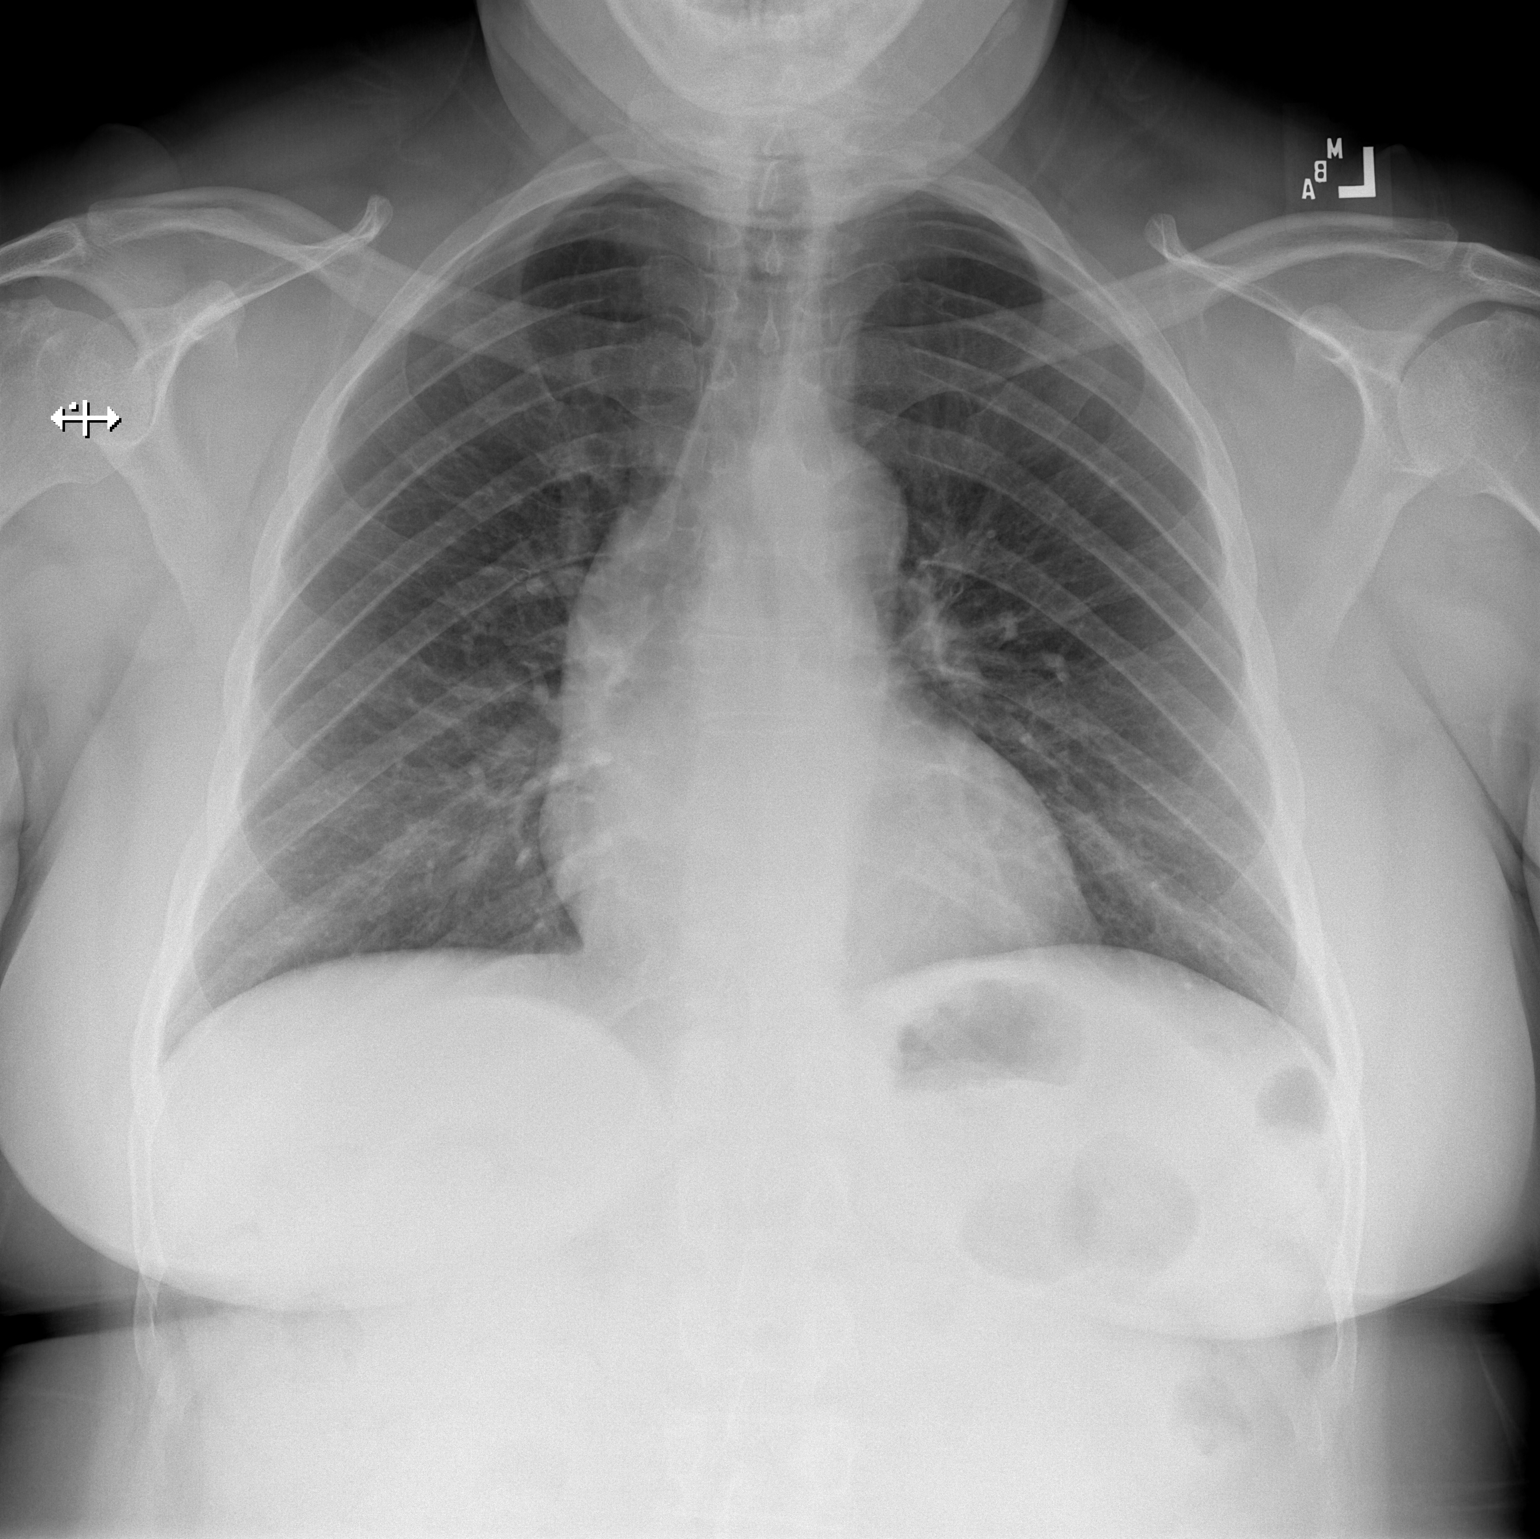

[w chest lat]
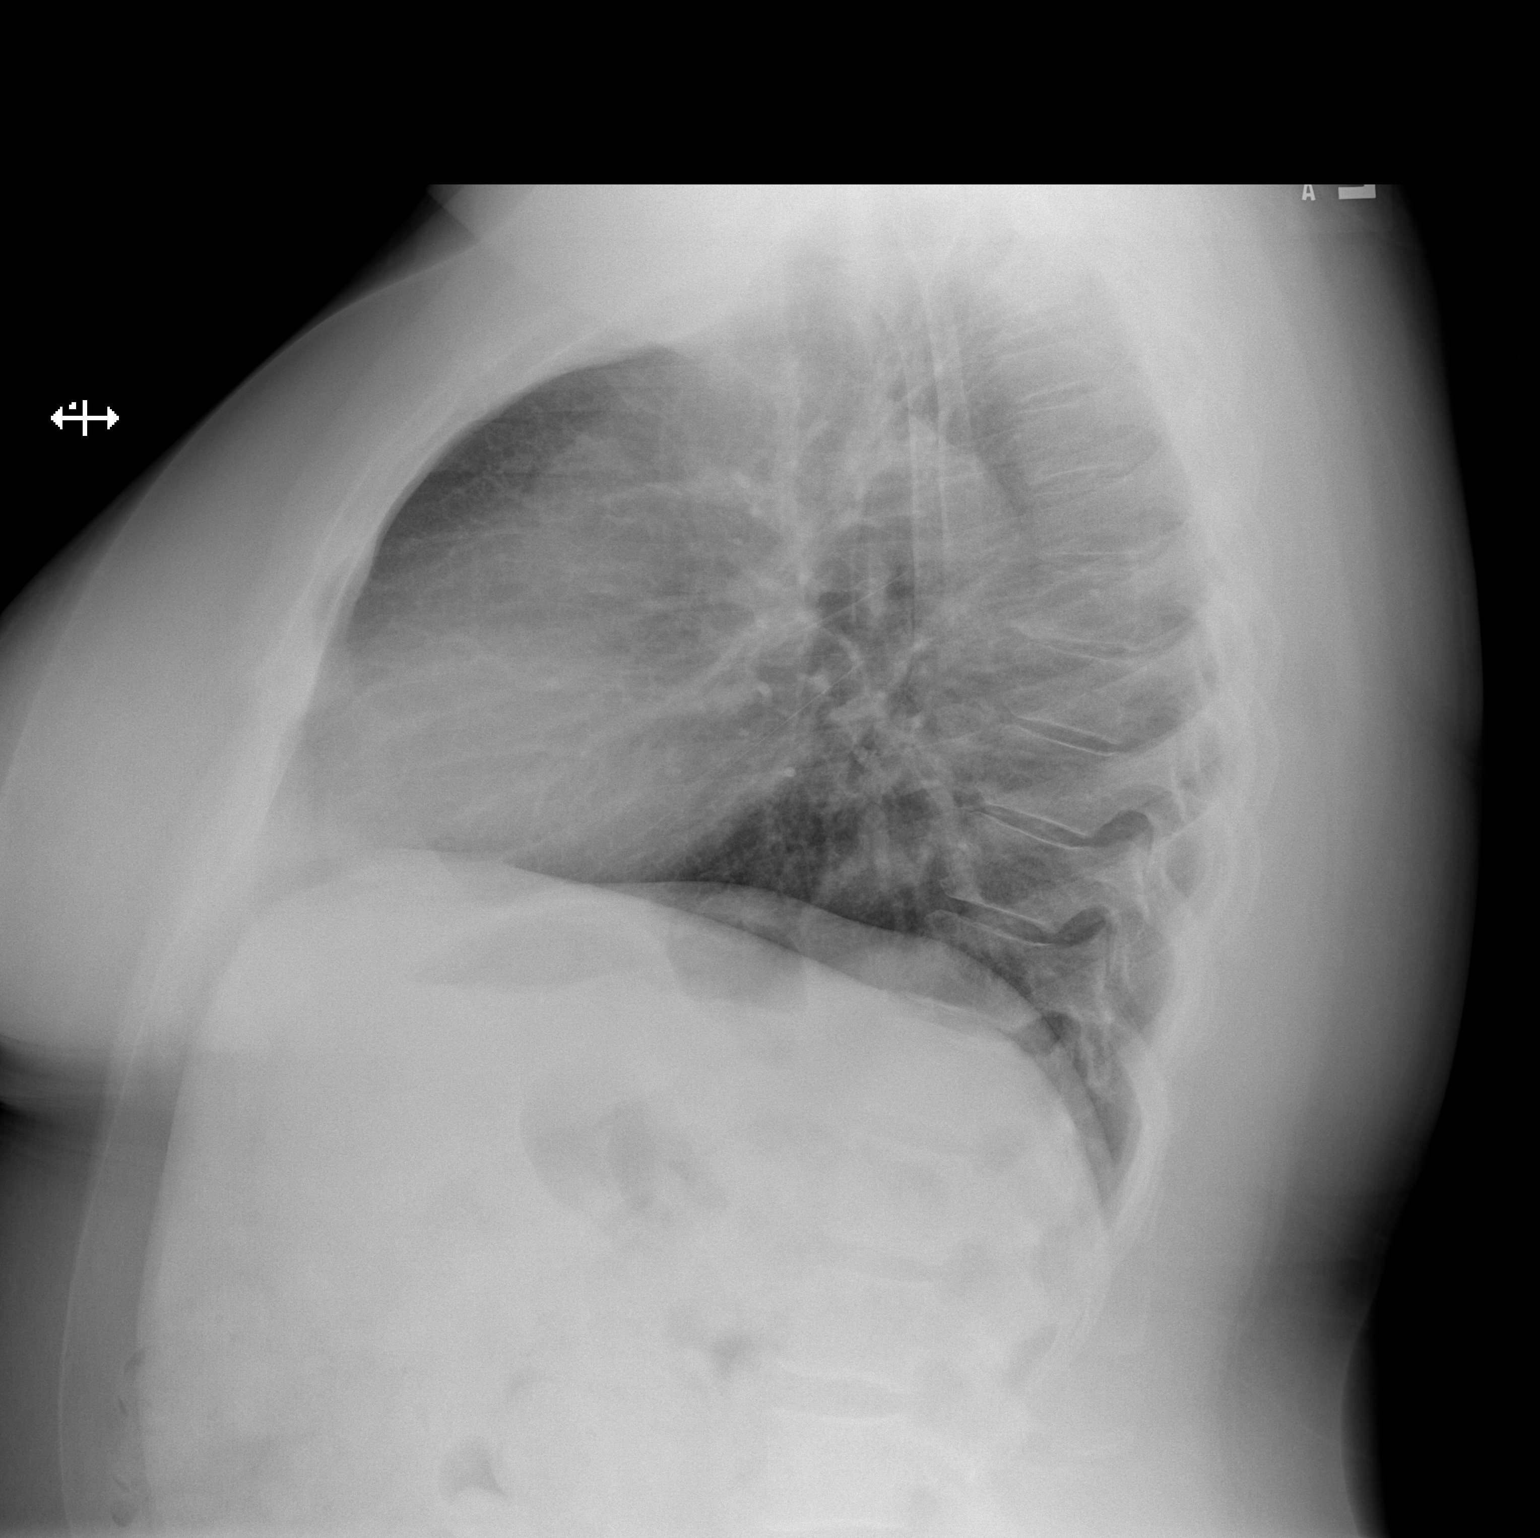

[2 of 2 positions shown; findings below may reference images not displayed]

FINDINGS: No consolidation, features of edema, pneumothorax, or effusion.
Pulmonary vascularity is normally distributed. The cardiomediastinal
contours are unremarkable. No acute osseous or soft tissue
abnormality.
IMPRESSION: No acute cardiopulmonary abnormality.

## 2020-06-27 NOTE — Progress Notes (Signed)
[id number] [place], [place] [address] [place] [address] Phone: [phone] Fax: [phone]      Your procedure is scheduled on Wednesday, December 29th.  Report to [facility] Main Entrance "A" at 9:20 A.M., and check in at the Admitting office.  Call this number if you have problems the morning of surgery:  [phone]  Call [phone] if you have any questions prior to your surgery date Monday-Friday 8am-4pm    Remember:  Do not eat or drink after midnight the night before your surgery     Take these medicines the morning of surgery with A SIP OF WATER   Tylenol - if needed  Amlodipine (Norvasc)  Atorvastatin (Lipitor)  Eye drops - if needed    As of today, STOP taking any Aspirin (unless otherwise instructed by your surgeon) Aleve, Naproxen, Ibuprofen, Motrin, Advil, Goody's, BC's, all herbal medications, fish oil, and all vitamins.                      Do not wear jewelry, make up, or nail polish            Do not wear lotions, powders, perfumes, or deodorant.            Do not shave 48 hours prior to surgery.              Do not bring valuables to the hospital.            [facility] is not responsible for any belongings or valuables.  Do NOT Smoke (Tobacco/Vaping) or drink Alcohol 24 hours prior to your procedure If you use a CPAP at night, you may bring all equipment for your overnight stay.   Contacts, glasses, dentures or bridgework may not be worn into surgery.      For patients admitted to the hospital, discharge time will be determined by your treatment team.   Patients discharged the day of surgery will not be allowed to drive home, and someone needs to stay with them for 24 hours.    Special instructions:   [place]- Preparing For Surgery  Before surgery, you can play an important role. Because skin is not sterile, your skin needs to be as free of germs as possible. You can  reduce the number of germs on your skin by washing with CHG (chlorahexidine gluconate) Soap before surgery.  CHG is an antiseptic cleaner which kills germs and bonds with the skin to continue killing germs even after washing.    Oral Hygiene is also important to reduce your risk of infection.  Remember - BRUSH YOUR TEETH THE MORNING OF SURGERY WITH YOUR REGULAR TOOTHPASTE  Please do not use if you have an allergy to CHG or antibacterial soaps. If your skin becomes reddened/irritated stop using the CHG.  Do not shave (including legs and underarms) for at least 48 hours prior to first CHG shower. It is OK to shave your face.  Please follow these instructions carefully.   1. Shower the NIGHT BEFORE SURGERY and the MORNING OF SURGERY with CHG Soap.   2. If you chose to wash your hair, wash your hair first as usual with your normal shampoo.  3. After you shampoo, rinse your hair and body thoroughly to remove the shampoo.  4. Use CHG as you would any other liquid soap. You can apply CHG directly to the skin and wash  gently with a scrungie or a clean washcloth.   5. Apply the CHG Soap to your body ONLY FROM THE NECK DOWN.  Do not use on open wounds or open sores. Avoid contact with your eyes, ears, mouth and genitals (private parts). Wash Face and genitals (private parts)  with your normal soap.   6. Wash thoroughly, paying special attention to the area where your surgery will be performed.  7. Thoroughly rinse your body with warm water from the neck down.  8. DO NOT shower/wash with your normal soap after using and rinsing off the CHG Soap.  9. Pat yourself dry with a CLEAN TOWEL.  10. Wear CLEAN PAJAMAS to bed the night before surgery  11. Place CLEAN SHEETS on your bed the night of your first shower and DO NOT SLEEP WITH PETS.   Day of Surgery: Wear Clean/Comfortable clothing the morning of surgery Do not apply any deodorants/lotions.   Remember to brush your teeth WITH YOUR REGULAR  TOOTHPASTE.   Please read over the following fact sheets that you were given.

## 2020-06-27 NOTE — Progress Notes (Addendum)
PCP - Evelina Dun Cardiologist - denies  Chest x-ray - 06-27-20 EKG - 11-30-19 Stress Test - 2021 ECHO - 2021   COVID TEST- 07-04-20   Anesthesia review: yes, EKG  Patient needed cardiac clearance for bariatric surgery to be done earlier this year, but decided to postpone surgery for now.  Cardiac clearance received from [facility] [place].  Office is supposed to fax over last office visit stating cardiac clearance.   Patient's BP elevated at PAT appointment. BP recheck was 133/92. Per patient, did not take BP meds this morning and stated that she hasn't slept or had any pain medications today.    Patient denies shortness of breath, fever, cough and chest pain at PAT appointment   All instructions explained to the patient, with a verbal understanding of the material. Patient agrees to go over the instructions while at home for a better understanding. Patient also instructed to self quarantine after being tested for COVID-19. The opportunity to ask questions was provided.

## 2020-06-28 NOTE — Progress Notes (Signed)
Anesthesia Chart Review:  Case: [id number] Date/Time: 07/06/20 1104   Procedure: PLIF - L4-L5 - L5-S1 (N/A Back)   Anesthesia type: General   Pre-op diagnosis: Spondylolisthesis - Stenosis   Location: [facility] OR ROOM 21 / [place] OR   Surgeons: Eustace Moore, MD      DISCUSSION: Patient is a 44 year old female scheduled for the above procedure.  History includes former smoker (quit 02/06/13), HTN, hypercholesterolemia, Sjogren's syndrome, pedal edema, gallstones (03/28/20 [place]), right TKA (01/03/15). S/p mammary reduction 05/12/19. S/p hysterectomy/right salpingectomy (for leiomyoma, menorrhagia) 01/29/17. BMI is consistent with obesity.   She is being considered at [facility] for bariatric surgery, and had a recent cardiology evaluation at [facility] by Dr. Claudie Leach. He noted previous "exercise bike echo inadequate study due to poor performance. Resting echo normal", so had been referred for pharmacologic stress MPI study which wad done on 05/26/20 and was not ischemic. No further cardiovascular testing or intervention recommended with as needed follow-up.   Preoperative COVID-19 testing is scheduled for 07/04/2020.  Anesthesia team to evaluate on the day of surgery.   VS: BP (!) 133/92   Pulse 79   Temp 36.4 C (Oral)   Resp 18   Ht 5\' 9"  (1.753 m)   Wt 123.5 kg   LMP 01/06/2017 (Exact Date)   SpO2 100%   BMI 40.20 kg/m  She had not taken BP medication prior to her PAT visit.    PROVIDERS: Sharion Balloon, FNP is PCP  Robbi Garter, MD is bariatric surgeon ([facility]) Lawson Radar, MD is cardiologist [facility]). Seen in Fall 2021 for preoperative cardiac testing for pending bariatric surgery. As needed follow-up at 06/13/20 visit following "recent normal echo and stress MPI studies".   LABS: Labs reviewed: Acceptable for surgery. LFTs WNL, A1c 5.6% on 04/04/20 ([facility]).  (all labs ordered are listed, but only abnormal results are displayed)  Labs Reviewed  BASIC  METABOLIC PANEL - Abnormal; Notable for the following components:      Result Value   Potassium 3.2 (*)    Glucose, Bld 117 (*)    All other components within normal limits  SURGICAL PCR SCREEN  CBC WITH DIFFERENTIAL/PLATELET  PROTIME-INR  TYPE AND SCREEN     IMAGES: CXR 06/27/20: FINDINGS: No consolidation, features of edema, pneumothorax, or effusion. Pulmonary vascularity is normally distributed. The cardiomediastinal contours are unremarkable. No acute osseous or soft tissue abnormality. IMPRESSION: No acute cardiopulmonary abnormality.  MRI L-spine 05/25/20: IMPRESSION: - Mild right-sided narrowing at L4-5 and L5-S1 unchanged - Moderate left foraminal narrowing L5-S1 due to disc protrusion and spurring. No change from the prior MRI.  Abd/RUQ [place] 03/28/20 ([facility]): IMPRESSION:  1. Cholelithiasis, negative sonographic Murphy's sign, and no biliary ductaldilatation.  2. Hepatomegaly and hepatic steatosis.   EKG: 11/30/19: Sinus  Rhythm  -Old anterior infarct.    CV: Nuclear stress test 05/26/20 [facility]): Impression: The resting ECG shows normal sinus rhythm.  The resting and stress ECG shows normal ST segment; and no ventricular tachycardia, significant QRS prolongation or heart block.  Both the rest and stress images are with normal limits.  No significant reversible ischemia or fixed scar.  Gated left ventricular ejection fraction is normal (71%).  Normal LV segmental wall motion.   Past Medical History:  Diagnosis Date  . Arthritis   . Back pain   . Bilateral swelling of feet   . Fibroids   . Gallstone 2021  . Headache(784.0)  at times, nothing severe per pt.menstral  . High cholesterol   . Hypertension   . Joint pain   . Sjogrens syndrome (H[facility])   . Vitamin D deficiency     Past Surgical History:  Procedure Laterality Date  . BILATERAL SALPINGECTOMY Right 01/29/2017   Procedure: RIGHT SALPINGECTOMY;  Surgeon: Tilda Burrow,  MD;  Location: [facility];  Service: Gynecology;  Laterality: Right;  . BREAST REDUCTION SURGERY Bilateral 05/12/2019   Procedure: BILATERAL MAMMARY REDUCTION  (BREAST);  Surgeon: Allena Napoleon, MD;  Location: [facility];  Service: Plastics;  Laterality: Bilateral;  3.5 hours, please  . CESAREAN SECTION    . DILATION AND CURETTAGE OF UTERUS    . EXTERNAL FIXATION LEG Right 07/05/2013   Procedure: EXTERNAL FIXATION LEG;  Surgeon: Cheral Almas, MD;  Location: [facility] OR;  Service: Orthopedics;  Laterality: Right;  . EXTERNAL FIXATION LEG Right 07/07/2013   Procedure: Ex-Fix Revision Right Tibial Plateau;  Surgeon: Budd Palmer, MD;  Location: [facility] OR;  Service: Orthopedics;  Laterality: Right;  . EXTERNAL FIXATION REMOVAL Right 07/28/2013   Procedure: REMOVAL EXTERNAL FIXATION LEG;  Surgeon: Budd Palmer, MD;  Location: [facility] OR;  Service: Orthopedics;  Laterality: Right;  . HARDWARE REMOVAL Right 08/02/2014   Procedure: HARDWARE REMOVAL;  Surgeon: Dannielle Huh, MD;  Location: [facility] OR;  Service: Orthopedics;  Laterality: Right;  . KNEE ARTHROSCOPY Right 08/02/2014   Procedure: ARTHROSCOPY KNEE;  Surgeon: Dannielle Huh, MD;  Location: [facility] OR;  Service: Orthopedics;  Laterality: Right;  . ORIF TIBIA PLATEAU Right 07/28/2013   Procedure: OPEN REDUCTION INTERNAL FIXATION (ORIF) RIGHT TIBIAL PLATEAU;  Surgeon: Budd Palmer, MD;  Location: [facility] OR;  Service: Orthopedics;  Laterality: Right;  . SUPRACERVICAL ABDOMINAL HYSTERECTOMY N/A 01/29/2017   Procedure: HYSTERECTOMY SUPRACERVICAL ABDOMINAL;  Surgeon: Tilda Burrow, MD;  Location: [facility];  Service: Gynecology;  Laterality: N/A;  . TONSILLECTOMY  1992  . TOTAL KNEE ARTHROPLASTY Right 01/03/2015   Procedure: TOTAL KNEE ARTHROPLASTY;  Surgeon: Dannielle Huh, MD;  Location: [facility] OR;  Service: Orthopedics;  Laterality: Right;  former tibial plateau fracture orif with hardware removed 07/2014    MEDICATIONS: . acetaminophen (TYLENOL) 500 MG tablet   . amLODipine (NORVASC) 10 MG tablet  . atorvastatin (LIPITOR) 20 MG tablet  . celecoxib (CELEBREX) 200 MG capsule  . cycloSPORINE (RESTASIS) 0.05 % ophthalmic emulsion  . ibuprofen (ADVIL) 200 MG tablet  . losartan-hydrochlorothiazide (HYZAAR) 100-25 MG tablet  . Multiple Vitamins-Minerals (MULTIVITAMIN WITH MINERALS) tablet  . Ursodiol (RELTONE) 200 MG CAPS   No current facility-administered medications for this encounter.    Shonna Chock, PA-C Surgical Short Stay/Anesthesiology [facility] Phone [phone] [facility] Phone [phone] 06/28/2020 5:11 PM

## 2020-06-28 NOTE — Anesthesia Preprocedure Evaluation (Addendum)
Anesthesia Evaluation  Patient identified by MRN, date of birth, ID band Patient awake    Reviewed: Allergy & Precautions, NPO status , Patient's Chart, lab work & pertinent test results  Airway Mallampati: II  TM Distance: >3 FB Neck ROM: Full    Dental  (+) Teeth Intact   Pulmonary neg pulmonary ROS, former smoker,    Pulmonary exam normal        Cardiovascular hypertension, Pt. on medications  Rhythm:Regular Rate:Normal     Neuro/Psych  Headaches, negative psych ROS   GI/Hepatic Neg liver ROS, gallstones   Endo/Other  negative endocrine ROS  Renal/GU negative Renal ROS  negative genitourinary   Musculoskeletal  (+) Arthritis , Osteoarthritis,    Abdominal (+)  Abdomen: soft. Bowel sounds: normal.  Peds  Hematology negative hematology ROS (+)   Anesthesia Other Findings   Reproductive/Obstetrics negative OB ROS                            Anesthesia Physical Anesthesia Plan  ASA: II  Anesthesia Plan: General   Post-op Pain Management:    Induction: Intravenous  PONV Risk Score and Plan: 3 and Ondansetron, Dexamethasone, Midazolam and Treatment may vary due to age or medical condition  Airway Management Planned: Mask and Oral ETT  Additional Equipment: None  Intra-op Plan:   Post-operative Plan: Extubation in OR  Informed Consent: I have reviewed the patients History and Physical, chart, labs and discussed the procedure including the risks, benefits and alternatives for the proposed anesthesia with the patient or authorized representative who has indicated his/her understanding and acceptance.     Dental advisory given  Plan Discussed with: CRNA  Anesthesia Plan Comments: (PAT note written 06/28/2020 by Myra Gianotti, PA-C. Lab Results      Component                Value               Date                      WBC                      7.4                  06/27/2020                HGB                      13.5                06/27/2020                HCT                      42.2                06/27/2020                MCV                      92.3                06/27/2020                PLT  230                 06/27/2020           Lab Results      Component                Value               Date                      NA                       141                 06/27/2020                K                        3.2 (L)             06/27/2020                CO2                      26                  06/27/2020                GLUCOSE                  117 (H)             06/27/2020                BUN                      10                  06/27/2020                CREATININE               0.83                06/27/2020                CALCIUM                  9.1                 06/27/2020                GFRNONAA                 >60                 06/27/2020                GFRAA                    85                  12/14/2019          )       Anesthesia Quick Evaluation

## 2020-07-04 ENCOUNTER — Other Ambulatory Visit (HOSPITAL_COMMUNITY)
Admission: RE | Admit: 2020-07-04 | Discharge: 2020-07-04 | Disposition: A | Discharge status: Home / Self Care | Payer: BC Managed Care – PPO | Source: Ambulatory Visit | Attending: Neurological Surgery | Admitting: Neurological Surgery

## 2020-07-04 DIAGNOSIS — M48061 Spinal stenosis, lumbar region without neurogenic claudication: Secondary | ICD-10-CM | POA: Diagnosis not present

## 2020-07-04 DIAGNOSIS — M545 Low back pain, unspecified: Secondary | ICD-10-CM | POA: Diagnosis not present

## 2020-07-04 DIAGNOSIS — Z79899 Other long term (current) drug therapy: Secondary | ICD-10-CM | POA: Diagnosis not present

## 2020-07-04 DIAGNOSIS — M5117 Intervertebral disc disorders with radiculopathy, lumbosacral region: Secondary | ICD-10-CM | POA: Diagnosis not present

## 2020-07-04 DIAGNOSIS — Z01812 Encounter for preprocedural laboratory examination: Secondary | ICD-10-CM | POA: Insufficient documentation

## 2020-07-04 DIAGNOSIS — M4326 Fusion of spine, lumbar region: Secondary | ICD-10-CM | POA: Diagnosis not present

## 2020-07-04 DIAGNOSIS — M5416 Radiculopathy, lumbar region: Secondary | ICD-10-CM | POA: Diagnosis not present

## 2020-07-04 DIAGNOSIS — Z96651 Presence of right artificial knee joint: Secondary | ICD-10-CM | POA: Diagnosis not present

## 2020-07-04 DIAGNOSIS — Z87891 Personal history of nicotine dependence: Secondary | ICD-10-CM | POA: Diagnosis not present

## 2020-07-04 DIAGNOSIS — M532X6 Spinal instabilities, lumbar region: Secondary | ICD-10-CM | POA: Diagnosis not present

## 2020-07-04 DIAGNOSIS — Z20822 Contact with and (suspected) exposure to covid-19: Secondary | ICD-10-CM | POA: Diagnosis not present

## 2020-07-04 DIAGNOSIS — M4807 Spinal stenosis, lumbosacral region: Secondary | ICD-10-CM | POA: Diagnosis not present

## 2020-07-04 DIAGNOSIS — M4317 Spondylolisthesis, lumbosacral region: Secondary | ICD-10-CM | POA: Diagnosis not present

## 2020-07-04 DIAGNOSIS — Z6841 Body Mass Index (BMI) 40.0 and over, adult: Secondary | ICD-10-CM | POA: Diagnosis not present

## 2020-07-04 DIAGNOSIS — E7800 Pure hypercholesterolemia, unspecified: Secondary | ICD-10-CM | POA: Diagnosis not present

## 2020-07-04 DIAGNOSIS — M4316 Spondylolisthesis, lumbar region: Secondary | ICD-10-CM | POA: Diagnosis not present

## 2020-07-04 DIAGNOSIS — M3500 Sicca syndrome, unspecified: Secondary | ICD-10-CM | POA: Diagnosis not present

## 2020-07-04 DIAGNOSIS — E559 Vitamin D deficiency, unspecified: Secondary | ICD-10-CM | POA: Diagnosis not present

## 2020-07-04 DIAGNOSIS — Z981 Arthrodesis status: Secondary | ICD-10-CM | POA: Diagnosis not present

## 2020-07-04 DIAGNOSIS — I10 Essential (primary) hypertension: Secondary | ICD-10-CM | POA: Diagnosis not present

## 2020-07-04 LAB — SARS CORONAVIRUS 2 (TAT 6-24 HRS): SARS Coronavirus 2: NEGATIVE

## 2020-07-05 MED ORDER — DEXTROSE 5 % IV SOLN
3.0000 g | INTRAVENOUS | Status: DC
  Filled 2020-07-05: qty 3000

## 2020-07-06 ENCOUNTER — Encounter (HOSPITAL_COMMUNITY): Payer: Self-pay | Admitting: Neurological Surgery

## 2020-07-06 ENCOUNTER — Inpatient Hospital Stay (HOSPITAL_COMMUNITY): Payer: BC Managed Care – PPO

## 2020-07-06 ENCOUNTER — Inpatient Hospital Stay (HOSPITAL_COMMUNITY)
Admission: RE | Admit: 2020-07-06 | Discharge: 2020-07-07 | DRG: 454 | Disposition: A | Discharge status: Home / Self Care | Payer: BC Managed Care – PPO | Attending: Neurological Surgery | Admitting: Neurological Surgery

## 2020-07-06 ENCOUNTER — Inpatient Hospital Stay (HOSPITAL_COMMUNITY): Payer: BC Managed Care – PPO | Admitting: Certified Registered"

## 2020-07-06 ENCOUNTER — Encounter (HOSPITAL_COMMUNITY)
Admission: RE | Disposition: A | Discharge status: Home / Self Care | Payer: Self-pay | Source: Home / Self Care | Attending: Neurological Surgery

## 2020-07-06 ENCOUNTER — Inpatient Hospital Stay (HOSPITAL_COMMUNITY): Payer: BC Managed Care – PPO | Admitting: Vascular Surgery

## 2020-07-06 ENCOUNTER — Other Ambulatory Visit: Payer: Self-pay

## 2020-07-06 DIAGNOSIS — Z20822 Contact with and (suspected) exposure to covid-19: Secondary | ICD-10-CM | POA: Diagnosis present

## 2020-07-06 DIAGNOSIS — M5416 Radiculopathy, lumbar region: Secondary | ICD-10-CM | POA: Diagnosis present

## 2020-07-06 DIAGNOSIS — Z981 Arthrodesis status: Secondary | ICD-10-CM

## 2020-07-06 DIAGNOSIS — Z87891 Personal history of nicotine dependence: Secondary | ICD-10-CM

## 2020-07-06 DIAGNOSIS — M4807 Spinal stenosis, lumbosacral region: Secondary | ICD-10-CM | POA: Diagnosis present

## 2020-07-06 DIAGNOSIS — I10 Essential (primary) hypertension: Secondary | ICD-10-CM | POA: Diagnosis present

## 2020-07-06 DIAGNOSIS — M4326 Fusion of spine, lumbar region: Secondary | ICD-10-CM | POA: Diagnosis not present

## 2020-07-06 DIAGNOSIS — M4317 Spondylolisthesis, lumbosacral region: Principal | ICD-10-CM | POA: Diagnosis present

## 2020-07-06 DIAGNOSIS — M4316 Spondylolisthesis, lumbar region: Secondary | ICD-10-CM | POA: Diagnosis present

## 2020-07-06 DIAGNOSIS — Z96651 Presence of right artificial knee joint: Secondary | ICD-10-CM | POA: Diagnosis present

## 2020-07-06 DIAGNOSIS — M3500 Sicca syndrome, unspecified: Secondary | ICD-10-CM | POA: Diagnosis present

## 2020-07-06 DIAGNOSIS — Z6841 Body Mass Index (BMI) 40.0 and over, adult: Secondary | ICD-10-CM

## 2020-07-06 DIAGNOSIS — M5117 Intervertebral disc disorders with radiculopathy, lumbosacral region: Secondary | ICD-10-CM | POA: Diagnosis present

## 2020-07-06 DIAGNOSIS — Z79899 Other long term (current) drug therapy: Secondary | ICD-10-CM | POA: Diagnosis not present

## 2020-07-06 DIAGNOSIS — E7800 Pure hypercholesterolemia, unspecified: Secondary | ICD-10-CM | POA: Diagnosis present

## 2020-07-06 DIAGNOSIS — M532X6 Spinal instabilities, lumbar region: Secondary | ICD-10-CM | POA: Diagnosis present

## 2020-07-06 DIAGNOSIS — M545 Low back pain, unspecified: Secondary | ICD-10-CM | POA: Diagnosis present

## 2020-07-06 DIAGNOSIS — Z419 Encounter for procedure for purposes other than remedying health state, unspecified: Secondary | ICD-10-CM

## 2020-07-06 IMAGING — RF DG LUMBAR SPINE 2-3V
1 series · 2 of 2 positions shown · non-contrast
Comparison: May 31, 2020

FLUOROSCOPY TIME:  1 minutes 13 seconds; 70.79 mGy; 2 acquired
images

CLINICAL DATA: Status post fusion

EXAM:
LUMBAR SPINE - 2-3 VIEW; DG C-ARM 1-60 MIN

[Series 1: run · 2 of 2 slices shown]
[im 1/2]
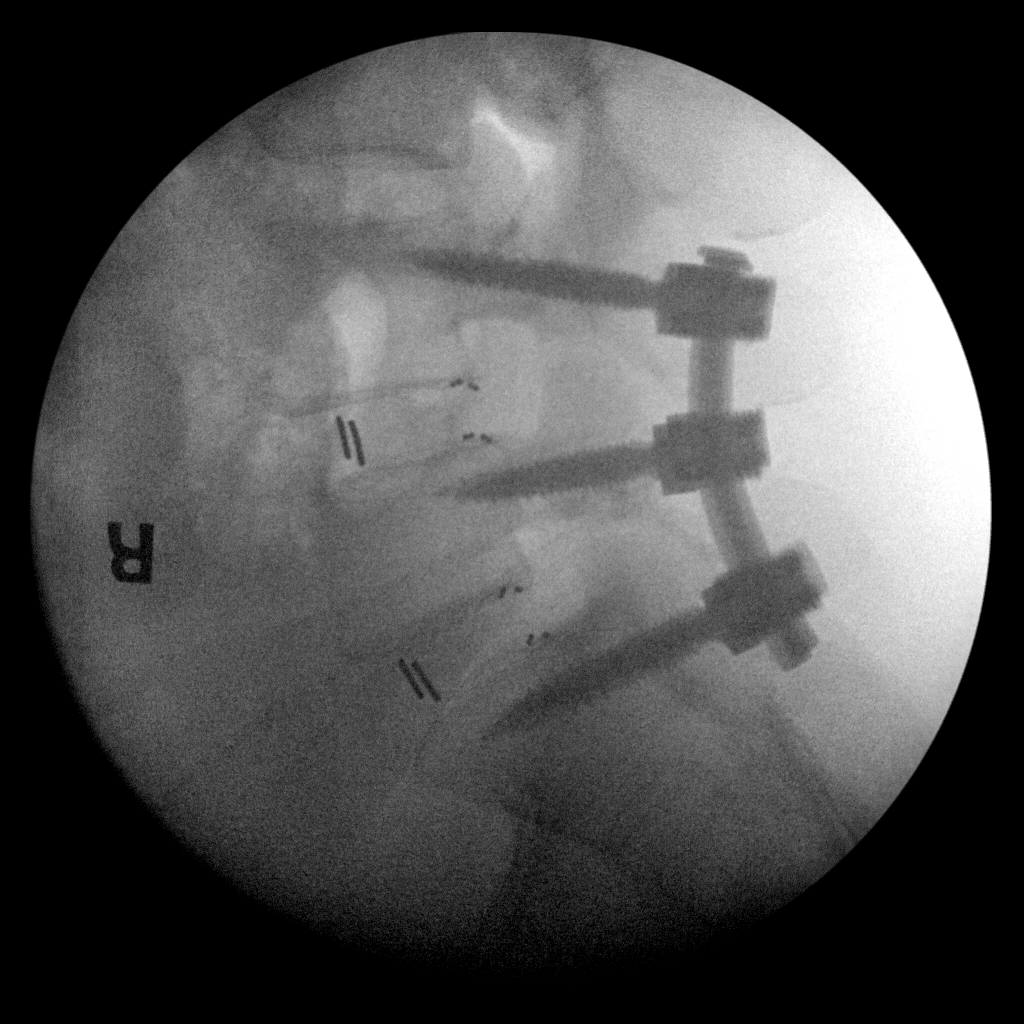
[im 2/2]
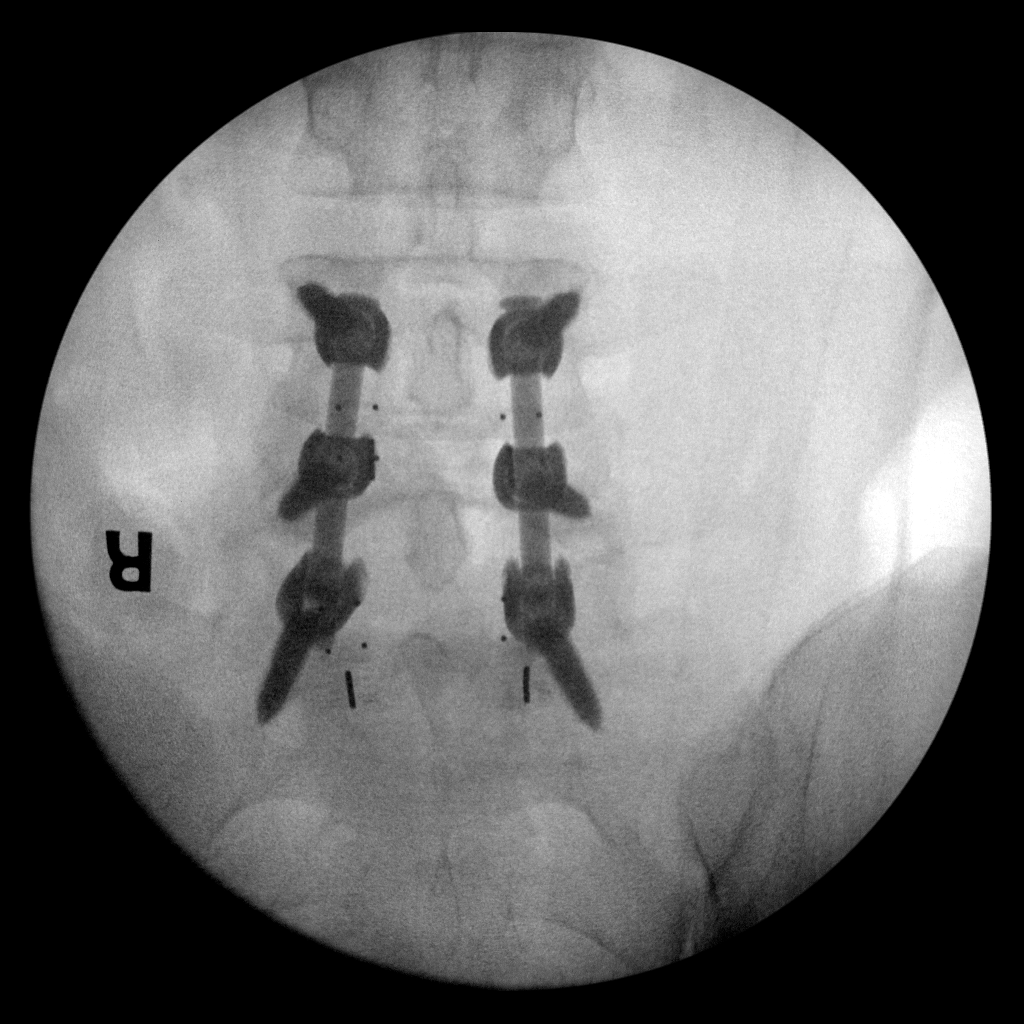

[2 of 2 positions shown; findings below may reference images not displayed]

FINDINGS: Frontal and lateral views obtained. Patient is status post posterior
screw and plate fixation at L4, L5, and S1 with disc spacers at L4-5
and L5-S1. No fracture. There is persistent grade I/IV
anterolisthesis of L5 on S1 with fixation in this area. No new
spondylolisthesis. Disc spaces appear unremarkable
IMPRESSION: Status post posterior screw and plate fixation at L4, L5, and S1
with disc spacers at L4-5 and L5-S1. Support hardware intact. Stable
spondylolisthesis at L5-S1 with fixation. Disc spaces appear
unremarkable. No fracture.

## 2020-07-06 IMAGING — RF DG LUMBAR SPINE 2-3V
1 series · 2 of 2 positions shown · non-contrast
Comparison: May 31, 2020

FLUOROSCOPY TIME:  1 minutes 13 seconds; 70.79 mGy; 2 acquired
images

CLINICAL DATA: Status post fusion

EXAM:
LUMBAR SPINE - 2-3 VIEW; DG C-ARM 1-60 MIN

[Series 1: run · 2 of 2 slices shown]
[im 1/2]
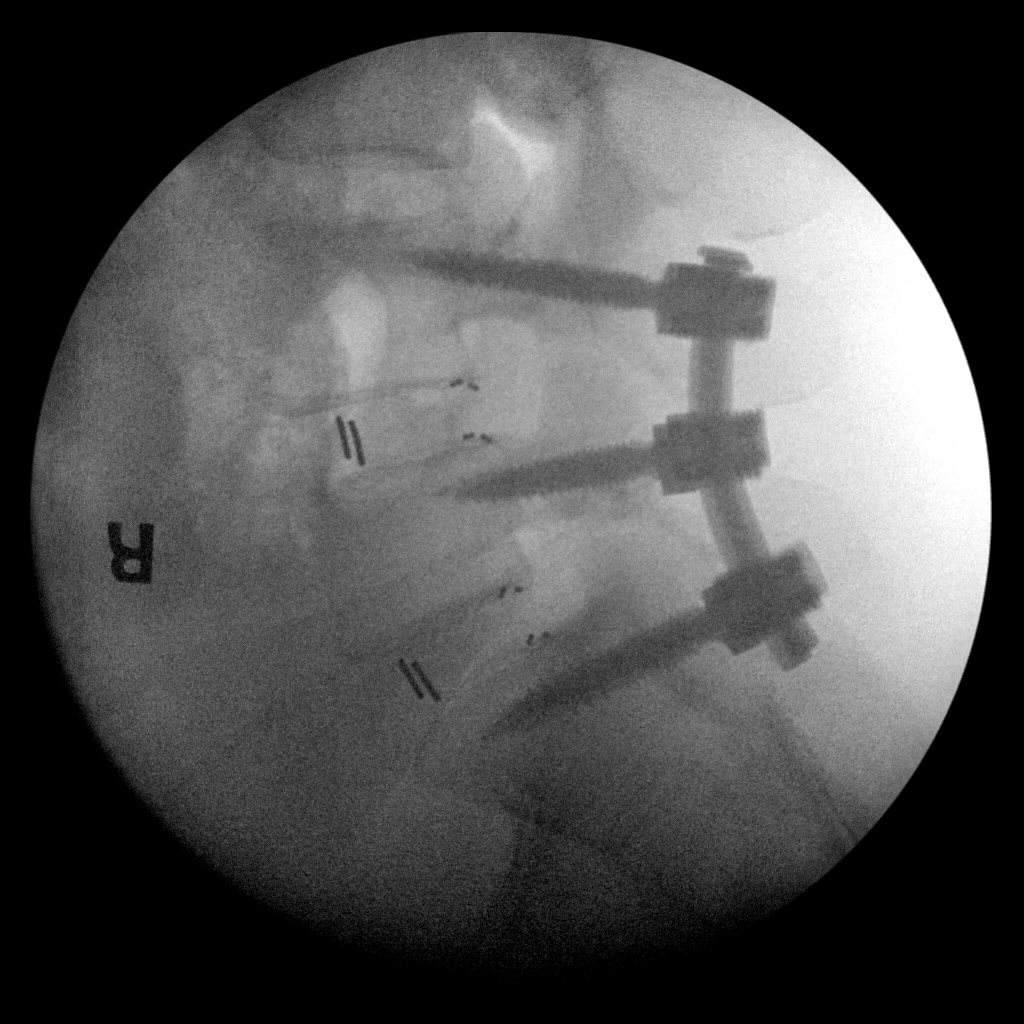
[im 2/2]
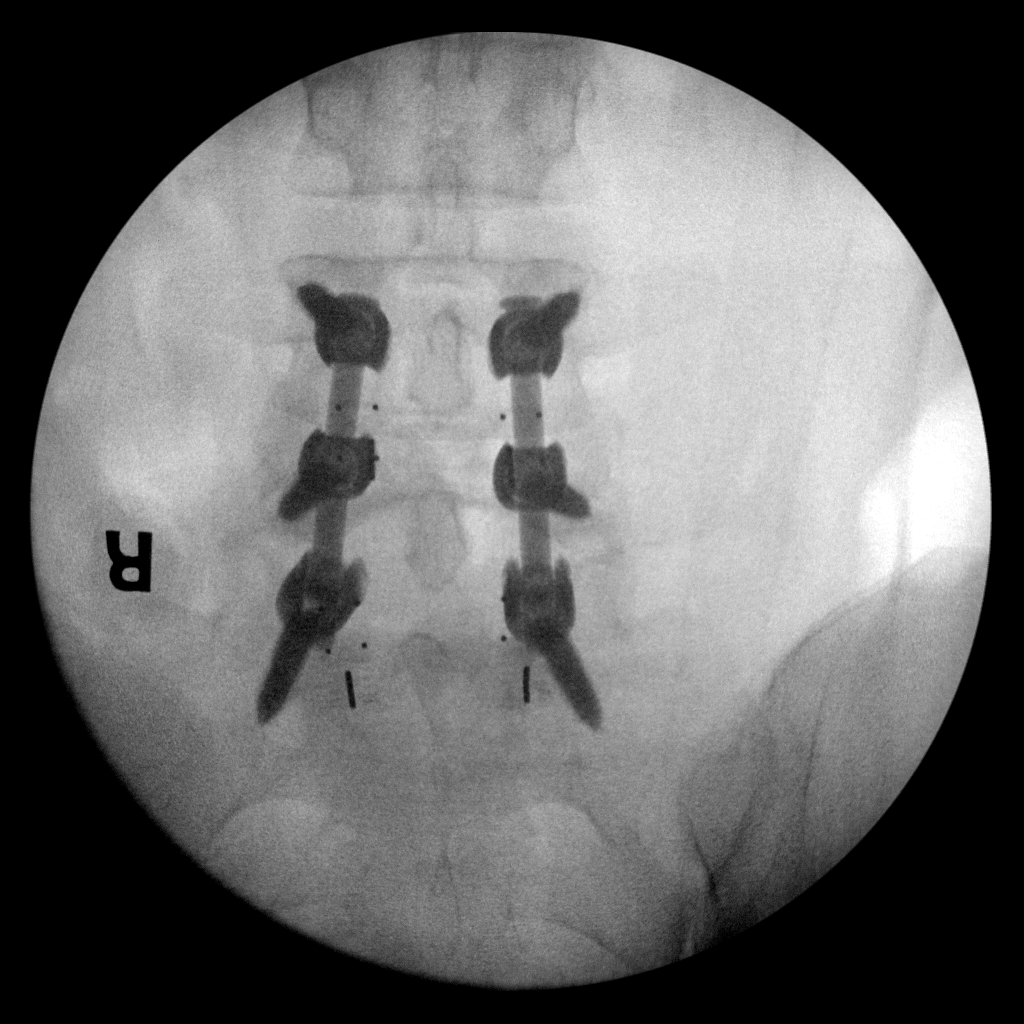

[2 of 2 positions shown; findings below may reference images not displayed]

FINDINGS: Frontal and lateral views obtained. Patient is status post posterior
screw and plate fixation at L4, L5, and S1 with disc spacers at L4-5
and L5-S1. No fracture. There is persistent grade I/IV
anterolisthesis of L5 on S1 with fixation in this area. No new
spondylolisthesis. Disc spaces appear unremarkable
IMPRESSION: Status post posterior screw and plate fixation at L4, L5, and S1
with disc spacers at L4-5 and L5-S1. Support hardware intact. Stable
spondylolisthesis at L5-S1 with fixation. Disc spaces appear
unremarkable. No fracture.

## 2020-07-06 SURGERY — POSTERIOR LUMBAR FUSION 2 LEVEL
Anesthesia: General | Site: Spine Lumbar

## 2020-07-06 MED ORDER — DEXAMETHASONE 4 MG PO TABS
4.0000 mg | ORAL_TABLET | Freq: Four times a day (QID) | ORAL | Status: DC
  Administered 2020-07-06 – 2020-07-07 (×2): 4 mg via ORAL
  Filled 2020-07-06 (×2): qty 1

## 2020-07-06 MED ORDER — SODIUM CHLORIDE 0.9 % IV SOLN: 250.0000 mL | INTRAVENOUS | Status: DC

## 2020-07-06 MED ORDER — ORAL CARE MOUTH RINSE: 15.0000 mL | Freq: Once | OROMUCOSAL | Status: AC

## 2020-07-06 MED ORDER — HYDROMORPHONE HCL 1 MG/ML IJ SOLN
INTRAMUSCULAR | Status: AC
  Administered 2020-07-06: 13:00:00 0.25 mg via INTRAVENOUS
  Filled 2020-07-06: qty 1

## 2020-07-06 MED ORDER — FENTANYL CITRATE (PF) 250 MCG/5ML IJ SOLN
INTRAMUSCULAR | Status: AC
  Filled 2020-07-06: qty 5

## 2020-07-06 MED ORDER — OXYCODONE HCL 5 MG PO TABS
10.0000 mg | ORAL_TABLET | ORAL | Status: DC | PRN
  Administered 2020-07-06 – 2020-07-07 (×5): 10 mg via ORAL
  Filled 2020-07-06 (×5): qty 2

## 2020-07-06 MED ORDER — HYDROCHLOROTHIAZIDE 25 MG PO TABS
25.0000 mg | ORAL_TABLET | Freq: Every day | ORAL | Status: DC
  Administered 2020-07-06: 15:00:00 25 mg via ORAL
  Filled 2020-07-06 (×2): qty 1

## 2020-07-06 MED ORDER — METHOCARBAMOL 1000 MG/10ML IJ SOLN
500.0000 mg | Freq: Four times a day (QID) | INTRAVENOUS | Status: DC | PRN
  Filled 2020-07-06: qty 5

## 2020-07-06 MED ORDER — POTASSIUM CHLORIDE IN NACL 20-0.9 MEQ/L-% IV SOLN: INTRAVENOUS | Status: DC

## 2020-07-06 MED ORDER — AMISULPRIDE (ANTIEMETIC) 5 MG/2ML IV SOLN
10.0000 mg | Freq: Once | INTRAVENOUS | Status: AC | PRN
  Administered 2020-07-06: 14:00:00 10 mg via INTRAVENOUS

## 2020-07-06 MED ORDER — PROPOFOL 10 MG/ML IV BOLUS
INTRAVENOUS | Status: AC
  Filled 2020-07-06: qty 20

## 2020-07-06 MED ORDER — ACETAMINOPHEN 500 MG PO TABS
1000.0000 mg | ORAL_TABLET | ORAL | Status: AC
  Administered 2020-07-06: 06:00:00 1000 mg via ORAL
  Filled 2020-07-06: qty 2

## 2020-07-06 MED ORDER — ARTHREX ANGEL - ACD-A SOLUTION (CHARTING ONLY) OPTIME
TOPICAL | Status: DC | PRN
  Administered 2020-07-06: 11:00:00 10 mL via TOPICAL

## 2020-07-06 MED ORDER — CHLORHEXIDINE GLUCONATE 0.12 % MT SOLN
15.0000 mL | Freq: Once | OROMUCOSAL | Status: AC
  Administered 2020-07-06: 06:00:00 15 mL via OROMUCOSAL
  Filled 2020-07-06: qty 15

## 2020-07-06 MED ORDER — ROCURONIUM BROMIDE 10 MG/ML (PF) SYRINGE
PREFILLED_SYRINGE | INTRAVENOUS | Status: DC | PRN
  Administered 2020-07-06: 70 mg via INTRAVENOUS
  Administered 2020-07-06: 10 mg via INTRAVENOUS
  Administered 2020-07-06: 20 mg via INTRAVENOUS

## 2020-07-06 MED ORDER — PROPOFOL 10 MG/ML IV BOLUS
INTRAVENOUS | Status: DC | PRN
  Administered 2020-07-06: 150 mg via INTRAVENOUS

## 2020-07-06 MED ORDER — MENTHOL 3 MG MT LOZG: 1.0000 | LOZENGE | OROMUCOSAL | Status: DC | PRN

## 2020-07-06 MED ORDER — SODIUM CHLORIDE 0.9% FLUSH: 3.0000 mL | INTRAVENOUS | Status: DC | PRN

## 2020-07-06 MED ORDER — THROMBIN 5000 UNITS EX SOLR
CUTANEOUS | Status: AC
  Filled 2020-07-06: qty 5000

## 2020-07-06 MED ORDER — ONDANSETRON HCL 4 MG PO TABS: 4.0000 mg | ORAL_TABLET | Freq: Four times a day (QID) | ORAL | Status: DC | PRN

## 2020-07-06 MED ORDER — THROMBIN 20000 UNITS EX SOLR
CUTANEOUS | Status: AC
  Filled 2020-07-06: qty 20000

## 2020-07-06 MED ORDER — EPHEDRINE 5 MG/ML INJ
INTRAVENOUS | Status: AC
  Filled 2020-07-06: qty 10

## 2020-07-06 MED ORDER — ROCURONIUM BROMIDE 10 MG/ML (PF) SYRINGE
PREFILLED_SYRINGE | INTRAVENOUS | Status: AC
  Filled 2020-07-06: qty 10

## 2020-07-06 MED ORDER — AMISULPRIDE (ANTIEMETIC) 5 MG/2ML IV SOLN
INTRAVENOUS | Status: AC
  Filled 2020-07-06: qty 4

## 2020-07-06 MED ORDER — HEPARIN SODIUM (PORCINE) 1000 UNIT/ML IJ SOLN
INTRAMUSCULAR | Status: AC
  Filled 2020-07-06: qty 1

## 2020-07-06 MED ORDER — PHENYLEPHRINE HCL-NACL 10-0.9 MG/250ML-% IV SOLN
INTRAVENOUS | Status: DC | PRN
  Administered 2020-07-06: 25 ug/min via INTRAVENOUS

## 2020-07-06 MED ORDER — HEPARIN SODIUM (PORCINE) 1000 UNIT/ML IJ SOLN
INTRAMUSCULAR | Status: DC | PRN
  Administered 2020-07-06: 10000 [IU] via INTRAVENOUS

## 2020-07-06 MED ORDER — LIDOCAINE 2% (20 MG/ML) 5 ML SYRINGE
INTRAMUSCULAR | Status: DC | PRN
  Administered 2020-07-06: 80 mg via INTRAVENOUS

## 2020-07-06 MED ORDER — THROMBIN 20000 UNITS EX SOLR: CUTANEOUS | Status: DC | PRN

## 2020-07-06 MED ORDER — PHENYLEPHRINE 40 MCG/ML (10ML) SYRINGE FOR IV PUSH (FOR BLOOD PRESSURE SUPPORT)
PREFILLED_SYRINGE | INTRAVENOUS | Status: DC | PRN
  Administered 2020-07-06 (×2): 120 ug via INTRAVENOUS
  Administered 2020-07-06 (×2): 80 ug via INTRAVENOUS

## 2020-07-06 MED ORDER — CEFAZOLIN SODIUM-DEXTROSE 2-4 GM/100ML-% IV SOLN
2.0000 g | Freq: Three times a day (TID) | INTRAVENOUS | Status: AC
Start: 2020-07-06 — End: 2020-07-07
  Administered 2020-07-06 (×2): 2 g via INTRAVENOUS
  Filled 2020-07-06 (×2): qty 100

## 2020-07-06 MED ORDER — LIDOCAINE 2% (20 MG/ML) 5 ML SYRINGE
INTRAMUSCULAR | Status: AC
  Filled 2020-07-06: qty 5

## 2020-07-06 MED ORDER — CHLORHEXIDINE GLUCONATE CLOTH 2 % EX PADS: 6.0000 | MEDICATED_PAD | Freq: Once | CUTANEOUS | Status: DC

## 2020-07-06 MED ORDER — MIDAZOLAM HCL 2 MG/2ML IJ SOLN
INTRAMUSCULAR | Status: AC
  Filled 2020-07-06: qty 2

## 2020-07-06 MED ORDER — DEXTROSE 5 % IV SOLN
INTRAVENOUS | Status: DC | PRN
  Administered 2020-07-06: 08:00:00 3 g via INTRAVENOUS

## 2020-07-06 MED ORDER — LOSARTAN POTASSIUM-HCTZ 100-25 MG PO TABS: 1.0000 | ORAL_TABLET | Freq: Every day | ORAL | Status: DC

## 2020-07-06 MED ORDER — SENNA 8.6 MG PO TABS
1.0000 | ORAL_TABLET | Freq: Two times a day (BID) | ORAL | Status: DC
  Administered 2020-07-06 – 2020-07-07 (×2): 8.6 mg via ORAL
  Filled 2020-07-06 (×2): qty 1

## 2020-07-06 MED ORDER — ACETAMINOPHEN 325 MG PO TABS
650.0000 mg | ORAL_TABLET | ORAL | Status: DC | PRN
  Administered 2020-07-07 (×2): 650 mg via ORAL
  Filled 2020-07-06 (×2): qty 2

## 2020-07-06 MED ORDER — FENTANYL CITRATE (PF) 100 MCG/2ML IJ SOLN
INTRAMUSCULAR | Status: DC | PRN
  Administered 2020-07-06 (×4): 50 ug via INTRAVENOUS

## 2020-07-06 MED ORDER — SUGAMMADEX SODIUM 200 MG/2ML IV SOLN
INTRAVENOUS | Status: DC | PRN
  Administered 2020-07-06: 250 mg via INTRAVENOUS

## 2020-07-06 MED ORDER — DEXAMETHASONE SODIUM PHOSPHATE 10 MG/ML IJ SOLN
INTRAMUSCULAR | Status: AC
  Filled 2020-07-06: qty 1

## 2020-07-06 MED ORDER — ONDANSETRON HCL 4 MG/2ML IJ SOLN: 4.0000 mg | Freq: Once | INTRAMUSCULAR | Status: DC | PRN

## 2020-07-06 MED ORDER — ACETAMINOPHEN 650 MG RE SUPP: 650.0000 mg | RECTAL | Status: DC | PRN

## 2020-07-06 MED ORDER — MIDAZOLAM HCL 2 MG/2ML IJ SOLN
INTRAMUSCULAR | Status: DC | PRN
  Administered 2020-07-06: 2 mg via INTRAVENOUS

## 2020-07-06 MED ORDER — CELECOXIB 200 MG PO CAPS
200.0000 mg | ORAL_CAPSULE | Freq: Two times a day (BID) | ORAL | Status: DC
  Administered 2020-07-06 – 2020-07-07 (×3): 200 mg via ORAL
  Filled 2020-07-06 (×3): qty 1

## 2020-07-06 MED ORDER — ADULT MULTIVITAMIN W/MINERALS CH
1.0000 | ORAL_TABLET | Freq: Every day | ORAL | Status: DC
  Administered 2020-07-07: 10:00:00 1 via ORAL
  Filled 2020-07-06: qty 1

## 2020-07-06 MED ORDER — ALBUMIN HUMAN 5 % IV SOLN: INTRAVENOUS | Status: DC | PRN

## 2020-07-06 MED ORDER — ONDANSETRON HCL 4 MG/2ML IJ SOLN
INTRAMUSCULAR | Status: AC
  Filled 2020-07-06: qty 2

## 2020-07-06 MED ORDER — SODIUM CHLORIDE 0.9% FLUSH
3.0000 mL | Freq: Two times a day (BID) | INTRAVENOUS | Status: DC
  Administered 2020-07-06: 21:00:00 3 mL via INTRAVENOUS

## 2020-07-06 MED ORDER — PHENOL 1.4 % MT LIQD: 1.0000 | OROMUCOSAL | Status: DC | PRN

## 2020-07-06 MED ORDER — LACTATED RINGERS IV SOLN: INTRAVENOUS | Status: DC

## 2020-07-06 MED ORDER — ONDANSETRON HCL 4 MG/2ML IJ SOLN: 4.0000 mg | Freq: Four times a day (QID) | INTRAMUSCULAR | Status: DC | PRN

## 2020-07-06 MED ORDER — GABAPENTIN 300 MG PO CAPS
300.0000 mg | ORAL_CAPSULE | ORAL | Status: AC
  Administered 2020-07-06: 06:00:00 300 mg via ORAL
  Filled 2020-07-06: qty 1

## 2020-07-06 MED ORDER — ONDANSETRON HCL 4 MG/2ML IJ SOLN
INTRAMUSCULAR | Status: DC | PRN
  Administered 2020-07-06: 4 mg via INTRAVENOUS

## 2020-07-06 MED ORDER — PHENYLEPHRINE 40 MCG/ML (10ML) SYRINGE FOR IV PUSH (FOR BLOOD PRESSURE SUPPORT)
PREFILLED_SYRINGE | INTRAVENOUS | Status: AC
  Filled 2020-07-06: qty 10

## 2020-07-06 MED ORDER — DEXAMETHASONE SODIUM PHOSPHATE 10 MG/ML IJ SOLN
10.0000 mg | Freq: Once | INTRAMUSCULAR | Status: AC
  Administered 2020-07-06: 08:00:00 10 mg via INTRAVENOUS
  Filled 2020-07-06: qty 1

## 2020-07-06 MED ORDER — DEXAMETHASONE SODIUM PHOSPHATE 4 MG/ML IJ SOLN
4.0000 mg | Freq: Four times a day (QID) | INTRAMUSCULAR | Status: DC
  Administered 2020-07-06 – 2020-07-07 (×2): 4 mg via INTRAVENOUS
  Filled 2020-07-06 (×2): qty 1

## 2020-07-06 MED ORDER — LOSARTAN POTASSIUM 50 MG PO TABS
100.0000 mg | ORAL_TABLET | Freq: Every day | ORAL | Status: DC
Start: 2020-07-06 — End: 2020-07-07
  Administered 2020-07-06: 15:00:00 100 mg via ORAL
  Filled 2020-07-06 (×2): qty 2

## 2020-07-06 MED ORDER — HYDROMORPHONE HCL 1 MG/ML IJ SOLN
0.5000 mg | INTRAMUSCULAR | Status: DC | PRN
  Administered 2020-07-06: 0.5 mg via INTRAVENOUS
  Filled 2020-07-06: qty 0.5

## 2020-07-06 MED ORDER — BUPIVACAINE HCL (PF) 0.25 % IJ SOLN
INTRAMUSCULAR | Status: DC | PRN
  Administered 2020-07-06: 8 mL

## 2020-07-06 MED ORDER — LACTATED RINGERS IV SOLN: INTRAVENOUS | Status: DC | PRN

## 2020-07-06 MED ORDER — BUPIVACAINE HCL (PF) 0.25 % IJ SOLN
INTRAMUSCULAR | Status: AC
  Filled 2020-07-06: qty 30

## 2020-07-06 MED ORDER — HYDROMORPHONE HCL 1 MG/ML IJ SOLN
0.2500 mg | INTRAMUSCULAR | Status: DC | PRN
  Administered 2020-07-06: 0.25 mg via INTRAVENOUS
  Administered 2020-07-06: 0.5 mg via INTRAVENOUS
  Administered 2020-07-06: 0.25 mg via INTRAVENOUS

## 2020-07-06 MED ORDER — AMLODIPINE BESYLATE 10 MG PO TABS
10.0000 mg | ORAL_TABLET | Freq: Every day | ORAL | Status: DC
  Filled 2020-07-06: qty 2
  Filled 2020-07-06: qty 1

## 2020-07-06 MED ORDER — 0.9 % SODIUM CHLORIDE (POUR BTL) OPTIME
TOPICAL | Status: DC | PRN
  Administered 2020-07-06: 09:00:00 1000 mL

## 2020-07-06 MED ORDER — HYDROMORPHONE HCL 1 MG/ML IJ SOLN
INTRAMUSCULAR | Status: AC
  Administered 2020-07-06: 12:00:00 0.5 mg via INTRAVENOUS
  Filled 2020-07-06: qty 1

## 2020-07-06 MED ORDER — METHOCARBAMOL 500 MG PO TABS
500.0000 mg | ORAL_TABLET | Freq: Four times a day (QID) | ORAL | Status: DC | PRN
  Administered 2020-07-06 – 2020-07-07 (×4): 500 mg via ORAL
  Filled 2020-07-06 (×4): qty 1

## 2020-07-06 MED ORDER — THROMBIN 5000 UNITS EX SOLR: OROMUCOSAL | Status: DC | PRN

## 2020-07-06 MED ORDER — EPHEDRINE SULFATE-NACL 50-0.9 MG/10ML-% IV SOSY
PREFILLED_SYRINGE | INTRAVENOUS | Status: DC | PRN
  Administered 2020-07-06 (×3): 10 mg via INTRAVENOUS

## 2020-07-06 SURGICAL SUPPLY — 63 items
ADH SKN CLS APL DERMABOND .7 (GAUZE/BANDAGES/DRESSINGS) ×1
APL SKNCLS STERI-STRIP NONHPOA (GAUZE/BANDAGES/DRESSINGS) ×1
BASKET BONE COLLECTION (BASKET) ×2 IMPLANT
BENZOIN TINCTURE PRP APPL 2/3 (GAUZE/BANDAGES/DRESSINGS) ×2 IMPLANT
BLADE CLIPPER SURG (BLADE) IMPLANT
BONE CANC CHIPS 20CC PCAN1/4 (Bone Implant) ×2 IMPLANT
BUR CARBIDE MATCH 3.0 (BURR) ×2 IMPLANT
CANISTER SUCT 3000ML PPV (MISCELLANEOUS) ×2 IMPLANT
CHIPS CANC BONE 20CC PCAN1/4 (Bone Implant) ×1 IMPLANT
CNTNR URN SCR LID CUP LEK RST (MISCELLANEOUS) ×1 IMPLANT
CONT SPEC 4OZ STRL OR WHT (MISCELLANEOUS) ×2
COVER BACK TABLE 60X90IN (DRAPES) ×2 IMPLANT
COVER WAND RF STERILE (DRAPES) ×2 IMPLANT
DERMABOND ADVANCED (GAUZE/BANDAGES/DRESSINGS) ×1
DERMABOND ADVANCED .7 DNX12 (GAUZE/BANDAGES/DRESSINGS) ×1 IMPLANT
DIFFUSER DRILL AIR PNEUMATIC (MISCELLANEOUS) ×2 IMPLANT
DRAPE C-ARM 42X72 X-RAY (DRAPES) ×4 IMPLANT
DRAPE C-ARMOR (DRAPES) IMPLANT
DRAPE LAPAROTOMY 100X72X124 (DRAPES) ×2 IMPLANT
DRAPE SURG 17X23 STRL (DRAPES) ×2 IMPLANT
DRSG OPSITE POSTOP 4X6 (GAUZE/BANDAGES/DRESSINGS) ×2 IMPLANT
DURAPREP 26ML APPLICATOR (WOUND CARE) ×2 IMPLANT
ELECT REM PT RETURN 9FT ADLT (ELECTROSURGICAL) ×2
ELECTRODE REM PT RTRN 9FT ADLT (ELECTROSURGICAL) ×1 IMPLANT
EVACUATOR 1/8 PVC DRAIN (DRAIN) ×2 IMPLANT
GAUZE 4X4 16PLY RFD (DISPOSABLE) IMPLANT
GLOVE BIO SURGEON STRL SZ7 (GLOVE) IMPLANT
GLOVE BIO SURGEON STRL SZ8 (GLOVE) ×4 IMPLANT
GLOVE BIOGEL PI IND STRL 7.0 (GLOVE) IMPLANT
GLOVE BIOGEL PI INDICATOR 7.0 (GLOVE)
GOWN STRL REUS W/ TWL LRG LVL3 (GOWN DISPOSABLE) IMPLANT
GOWN STRL REUS W/ TWL XL LVL3 (GOWN DISPOSABLE) ×2 IMPLANT
GOWN STRL REUS W/TWL 2XL LVL3 (GOWN DISPOSABLE) IMPLANT
GOWN STRL REUS W/TWL LRG LVL3 (GOWN DISPOSABLE)
GOWN STRL REUS W/TWL XL LVL3 (GOWN DISPOSABLE) ×4
HEMOSTAT POWDER KIT SURGIFOAM (HEMOSTASIS) IMPLANT
KIT BASIN OR (CUSTOM PROCEDURE TRAY) ×2 IMPLANT
KIT TURNOVER KIT B (KITS) ×2 IMPLANT
MILL MEDIUM DISP (BLADE) IMPLANT
NEEDLE HYPO 25X1 1.5 SAFETY (NEEDLE) ×2 IMPLANT
NS IRRIG 1000ML POUR BTL (IV SOLUTION) ×2 IMPLANT
PACK LAMINECTOMY NEURO (CUSTOM PROCEDURE TRAY) ×2 IMPLANT
PAD ARMBOARD 7.5X6 YLW CONV (MISCELLANEOUS) ×6 IMPLANT
ROD LORD LIPPED TI 5.5X65 (Rod) ×4 IMPLANT
SCREW CANC SHANK MOD 6.5X35 (Screw) ×4 IMPLANT
SCREW CANC SHANK MOD 6.5X45 (Screw) ×4 IMPLANT
SCREW CORT SHANK MOD 6.5X40 (Screw) ×4 IMPLANT
SCREW POLYAXIAL TULIP (Screw) ×12 IMPLANT
SET SCREW (Screw) ×12 IMPLANT
SET SCREW SPNE (Screw) ×6 IMPLANT
SPACER BATT PS 9X25X11 (Spacer) ×8 IMPLANT
SPONGE LAP 4X18 RFD (DISPOSABLE) IMPLANT
SPONGE SURGIFOAM ABS GEL 100 (HEMOSTASIS) ×2 IMPLANT
STRIP CLOSURE SKIN 1/2X4 (GAUZE/BANDAGES/DRESSINGS) ×2 IMPLANT
SUT VIC AB 0 CT1 18XCR BRD8 (SUTURE) ×1 IMPLANT
SUT VIC AB 0 CT1 8-18 (SUTURE) ×2
SUT VIC AB 2-0 CP2 18 (SUTURE) ×2 IMPLANT
SUT VIC AB 3-0 SH 8-18 (SUTURE) ×4 IMPLANT
SYR CONTROL 10ML LL (SYRINGE) ×2 IMPLANT
TOWEL GREEN STERILE (TOWEL DISPOSABLE) ×2 IMPLANT
TOWEL GREEN STERILE FF (TOWEL DISPOSABLE) ×2 IMPLANT
TRAY FOLEY MTR SLVR 16FR STAT (SET/KITS/TRAYS/PACK) ×2 IMPLANT
WATER STERILE IRR 1000ML POUR (IV SOLUTION) ×2 IMPLANT

## 2020-07-06 NOTE — Evaluation (Signed)
Physical Therapy Evaluation Patient Details Name: Carol Vargas MRN: [id number] DOB: [birth year] Today's Date: 07/06/2020   History of Present Illness  Patient is a 44 y/o female with PMH significant for HTN, HLD, sjogrens syndrome. Patient s/p PLIF L4-S1 on 12/29.  Clinical Impression  PTA, patient was independent and working, lives with husband, child, and mother. Patient requires supervision for bed mobility and sit to stand transfer with no AD. Upon standing, patient reports dizziness and requests to sit due to anterior/posterior sway. Patient declined another attempt or ambulation. Patient presents with deficits in endurance, strength, and balance. No PT follow up recommended at this time, will update as needed based on progress.     Follow Up Recommendations No PT follow up    Equipment Recommendations  None recommended by PT    Recommendations for Other Services       Precautions / Restrictions Precautions Precautions: Fall;Back Precaution Booklet Issued: Yes (comment) Required Braces or Orthoses: Spinal Brace Spinal Brace: Lumbar corset;Applied in sitting position Restrictions Weight Bearing Restrictions: No      Mobility  Bed Mobility Overal bed mobility: Needs Assistance Bed Mobility: Supine to Sit;Sit to Supine     Supine to sit: Supervision Sit to supine: Supervision   General bed mobility comments: supervision for safety, cues for log roll technique with good follow through    Transfers Overall transfer level: Needs assistance Equipment used: None Transfers: Sit to/from Stand Sit to Stand: Supervision         General transfer comment: from low surface. Upon standing, patient reports dizziness and requests to sit back down due to anterior/posterior sway. Patient declined another attempt and ambulation this session. Encouraged her to get up with nursing again later today  Ambulation/Gait                Stairs            Wheelchair  Mobility    Modified Rankin (Stroke Patients Only)       Balance Overall balance assessment: Needs assistance Sitting-balance support: No upper extremity supported;Feet supported Sitting balance-Leahy Scale: Good     Standing balance support: Single extremity supported Standing balance-Leahy Scale: Fair                               Pertinent Vitals/Pain Pain Assessment: Faces Faces Pain Scale: Hurts little more Pain Location: back (incision site) Pain Descriptors / Indicators: Grimacing Pain Intervention(s): Monitored during session    Home Living Family/patient expects to be discharged to:: Private residence Living Arrangements: Spouse/significant other;Children;Parent Available Help at Discharge: Family;Available 24 hours/day Type of Home: House Home Access: Level entry     Home Layout: One level (1 step to get to living room) Home Equipment: Shower seat - built in      Prior Function Level of Independence: Independent         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        Extremity/Trunk Assessment   Upper Extremity Assessment Upper Extremity Assessment: Defer to OT evaluation    Lower Extremity Assessment Lower Extremity Assessment: Overall WFL for tasks assessed       Communication   Communication: No difficulties  Cognition Arousal/Alertness: Lethargic;Suspect due to medications Behavior During Therapy: [facility] for tasks assessed/performed Overall Cognitive Status: Within Functional Limits for tasks assessed  General Comments      Exercises     Assessment/Plan    PT Assessment Patient needs continued PT services  PT Problem List Decreased strength;Decreased activity tolerance;Decreased balance;Decreased mobility       PT Treatment Interventions DME instruction;Gait training;Stair training;Therapeutic exercise;Therapeutic activities;Functional mobility  training;Balance training;Patient/family education    PT Goals (Current goals can be found in the Care Plan section)  Acute Rehab PT Goals Patient Stated Goal: to go home PT Goal Formulation: With patient Time For Goal Achievement: 07/20/20 Potential to Achieve Goals: Good    Frequency Min 5X/week   Barriers to discharge        Co-evaluation               AM-PAC PT "6 Clicks" Mobility  Outcome Measure Help needed turning from your back to your side while in a flat bed without using bedrails?: A Little Help needed moving from lying on your back to sitting on the side of a flat bed without using bedrails?: A Little Help needed moving to and from a bed to a chair (including a wheelchair)?: A Little Help needed standing up from a chair using your arms (e.g., wheelchair or bedside chair)?: A Little Help needed to walk in hospital room?: A Little Help needed climbing 3-5 steps with a railing? : A Little 6 Click Score: 18    End of Session Equipment Utilized During Treatment: Gait belt;Back brace Activity Tolerance: Patient limited by lethargy;Other (comment) (limited by dizziness) Patient left: in bed;with call bell/phone within reach Nurse Communication: Mobility status PT Visit Diagnosis: Unsteadiness on feet (R26.81);Muscle weakness (generalized) (M62.81)    Time: 1500-1520 PT Time Calculation (min) (ACUTE ONLY): 20 min   Charges:   PT Evaluation $PT Eval Low Complexity: 1 Low          Perrin Maltese, PT, DPT Acute Rehabilitation Services Pager [phone] Office [phone]   Alda Lea 07/06/2020, 3:38 PM

## 2020-07-06 NOTE — Anesthesia Procedure Notes (Signed)

## 2020-07-06 NOTE — Op Note (Signed)
07/06/2020  12:02 PM  PATIENT:  Carol Vargas  44 y.o. female  PRE-OPERATIVE DIAGNOSIS: Dynamic spondylolisthesis L4-5 L5-S1, left L5-S1 herniated disc with left L5 radiculopathy, foraminal stenosis, back and leg pain  POST-OPERATIVE DIAGNOSIS:  same  PROCEDURE:   1. Decompressive lumbar laminectomy hemifacetectomy and foraminotomies L4-5 L5-S1 requiring more work than would be required for a simple exposure of the disk for PLIF in order to adequately decompress the neural elements and address the spinal stenosis 2. Posterior lumbar interbody fusion L4-5 L5-S1 using peek interbody cages packed with morcellized allograft and autograft soaked with a bone marrow aspirate obtained through a separate fascial incision over the right iliac crest 3. Posterior fixation L4-S1 inclusive using Alphatec cortical pedicle screws.  4. Intertransverse arthrodesis L4-S1 using morcellized autograft and allograft.  SURGEON:  Marikay Alar, MD  ASSISTANTS: Verlin Dike, FNP  ANESTHESIA:  General  EBL: 550 ml  Total I/O In: 2550 [I.V.:2000; IV Piggyback:550] Out: 850 [Urine:300; Blood:550]  BLOOD ADMINISTERED:none  DRAINS: none   INDICATION FOR PROCEDURE: This patient presented with back and leg pain consistent with radiculopathy. Imaging revealed dynamic spondylolisthesis L4-5 L5-S1 with foraminal stenosis. The patient tried a reasonable attempt at conservative medical measures without relief. I recommended decompression and instrumented fusion to address the stenosis as well as the segmental  instability.  Patient understood the risks, benefits, and alternatives and potential outcomes and wished to proceed.  PROCEDURE DETAILS:  The patient was brought to the operating room. After induction of generalized endotracheal anesthesia the patient was rolled into the prone position on chest rolls and all pressure points were padded. The patient's lumbar region was cleaned and then prepped with DuraPrep and  draped in the usual sterile fashion. Anesthesia was injected and then a dorsal midline incision was made and carried down to the lumbosacral fascia. The fascia was opened and the paraspinous musculature was taken down in a subperiosteal fashion to expose L4-5 and L5-S1. A self-retaining retractor was placed. Intraoperative fluoroscopy confirmed my level, and I started with placement of the L4 cortical pedicle screws. The pedicle screw entry zones were identified utilizing surface landmarks and  AP and lateral fluoroscopy. I scored the cortex with the high-speed drill and then used the hand drill to drill an upward and outward direction into the pedicle. I then tapped line to line. I then placed a 6.5 x 40 mm cortical pedicle screw into the pedicles of L4 bilaterally.  I then dissected in a suprafascial plane to expose the iliac crest.  Opened the fascia and we used a Jamshidi needle to extract 60 cc of bone marrow aspirate from the iliac crest with the assistance of my nurse practitioner.  This was then spun down by [facility] device and 2 to 4 cc of  BMAC was soaked on morselized allograft for later arthrodesis.  I dried the hole with Surgifoam and closed the fascia.  I then turned my attention to the decompression and complete lumbar laminectomies, hemi- facetectomies, and foraminotomies were performed at L4-5 and L5-S1.  My nurse practitioner was directly involved in the decompression and exposure of the neural elements. the patient had significant spinal stenosis and this required more work than would be required for a simple exposure of the disc for posterior lumbar interbody fusion which would only require a limited laminotomy. Much more generous decompression and generous foraminotomy was undertaken in order to adequately decompress the neural elements and address the patient's leg pain. The yellow ligament was removed to expose the underlying  dura and nerve roots, and generous foraminotomies were performed to  adequately decompress the neural elements. Both the exiting and traversing nerve roots were decompressed on both sides until a coronary dilator passed easily along the nerve roots. Once the decompression was complete, I turned my attention to the posterior lower lumbar interbody fusion. The epidural venous vasculature was coagulated and cut sharply. Disc space was incised and the initial discectomy was performed with pituitary rongeurs. The disc space was distracted with sequential distractors to a height of 11 mm. We then used a series of scrapers and shavers to prepare the endplates for fusion. The midline was prepared with Epstein curettes. Once the complete discectomy was finished, we packed an appropriate sized interbody cage with local autograft and morcellized allograft, gently retracted the nerve root, and tapped the cage into position at L4-5 and L5-S1.  The midline between the cages was packed with morselized autograft and allograft. We then turned our attention to the placement of the lower pedicle screws. The pedicle screw entry zones were identified utilizing surface landmarks and fluoroscopy. I drilled into each pedicle utilizing the hand drill, and tapped each pedicle with the appropriate tap. We palpated with a ball probe to assure no break in the cortex. We then placed 6.5 x 40 mm pedicle screws into the pedicles bilaterally at L5 and S1.  My nurse practitioner assisted in placement of the pedicle screws.  We then decorticated the transverse processes and laid a mixture of morcellized autograft and allograft out over these to perform intertransverse arthrodesis at L4-5 L5-S1. We then placed lordotic rods into the multiaxial screw heads of the pedicle screws and locked these in position with the locking caps and anti-torque device. We then checked our construct with AP and lateral fluoroscopy. Irrigated with copious amounts of bacitracin-containing saline solution. Inspected the nerve roots once  again to assure adequate decompression, lined to the dura with Gelfoam, placed powdered vancomycin into the wound, and then we closed the muscle and the fascia with 0 Vicryl. Closed the subcutaneous tissues with 2-0 Vicryl and subcuticular tissues with 3-0 Vicryl. The skin was closed with benzoin and Steri-Strips. Dressing was then applied, the patient was awakened from general anesthesia and transported to the recovery room in stable condition. At the end of the procedure all sponge, needle and instrument counts were correct.   PLAN OF CARE: admit to inpatient  PATIENT DISPOSITION:  PACU - hemodynamically stable.   Delay start of Pharmacological VTE agent (>24hrs) due to surgical blood loss or risk of bleeding:  yes

## 2020-07-06 NOTE — H&P (Signed)
Subjective: Patient is a 44 y.o. female admitted for back and leg pain. Onset of symptoms was several months ago, gradually worsening since that time.  The pain is rated severe, and is located at the across the lower back and radiates to LLE. The pain is described as aching and occurs all day. The symptoms have been progressive. Symptoms are exacerbated by exercise and standing. MRI or CT showed dynamic instability L4-5 L5-S1   Past Medical History:  Diagnosis Date  . Arthritis   . Back pain   . Bilateral swelling of feet   . Fibroids   . Gallstone 2021  . Headache(784.0)    at times, nothing severe per pt.menstral  . High cholesterol   . Hypertension   . Joint pain   . Sjogrens syndrome ([place])   . Vitamin D deficiency     Past Surgical History:  Procedure Laterality Date  . BILATERAL SALPINGECTOMY Right 01/29/2017   Procedure: RIGHT SALPINGECTOMY;  Surgeon: Jonnie Kind, MD;  Location: [facility];  Service: Gynecology;  Laterality: Right;  . BREAST REDUCTION SURGERY Bilateral 05/12/2019   Procedure: BILATERAL MAMMARY REDUCTION  (BREAST);  Surgeon: [name], MD;  Location: [place];  Service: Plastics;  Laterality: Bilateral;  3.5 hours, please  . CESAREAN SECTION    . DILATION AND CURETTAGE OF UTERUS    . EXTERNAL FIXATION LEG Right 07/05/2013   Procedure: EXTERNAL FIXATION LEG;  Surgeon: Marianna Payment, MD;  Location: [place];  Service: Orthopedics;  Laterality: Right;  . EXTERNAL FIXATION LEG Right 07/07/2013   Procedure: Ex-Fix Revision Right Tibial Plateau;  Surgeon: Rozanna Box, MD;  Location: [place];  Service: Orthopedics;  Laterality: Right;  . EXTERNAL FIXATION REMOVAL Right 07/28/2013   Procedure: REMOVAL EXTERNAL FIXATION LEG;  Surgeon: Rozanna Box, MD;  Location: [place];  Service: Orthopedics;  Laterality: Right;  . HARDWARE REMOVAL Right 08/02/2014   Procedure: HARDWARE REMOVAL;  Surgeon: Vickey Huger, MD;  Location: [place];  Service:  Orthopedics;  Laterality: Right;  . KNEE ARTHROSCOPY Right 08/02/2014   Procedure: ARTHROSCOPY KNEE;  Surgeon: Vickey Huger, MD;  Location: [place];  Service: Orthopedics;  Laterality: Right;  . ORIF TIBIA PLATEAU Right 07/28/2013   Procedure: OPEN REDUCTION INTERNAL FIXATION (ORIF) RIGHT TIBIAL PLATEAU;  Surgeon: Rozanna Box, MD;  Location: [place];  Service: Orthopedics;  Laterality: Right;  . SUPRACERVICAL ABDOMINAL HYSTERECTOMY N/A 01/29/2017   Procedure: HYSTERECTOMY SUPRACERVICAL ABDOMINAL;  Surgeon: Jonnie Kind, MD;  Location: [facility];  Service: Gynecology;  Laterality: N/A;  . TONSILLECTOMY  1992  . TOTAL KNEE ARTHROPLASTY Right 01/03/2015   Procedure: TOTAL KNEE ARTHROPLASTY;  Surgeon: Vickey Huger, MD;  Location: [place];  Service: Orthopedics;  Laterality: Right;  former tibial plateau fracture orif with hardware removed 07/2014    Prior to Admission medications   Medication Sig Start Date End Date Taking? Authorizing Provider  acetaminophen (TYLENOL) 500 MG tablet Take 1,000 mg by mouth every 8 (eight) hours as needed for moderate pain.   Yes [provider]  amLODipine (NORVASC) 10 MG tablet Take 10 mg by mouth daily.   Yes [provider]  atorvastatin (LIPITOR) 20 MG tablet Take 20 mg by mouth daily.   Yes [provider]  celecoxib (CELEBREX) 200 MG capsule Take 200 mg by mouth daily. 05/27/20  Yes [provider]  cycloSPORINE (RESTASIS) 0.05 % ophthalmic emulsion Place 1 drop into both eyes 2 (two) times daily as needed (dry  eyes). 02/11/19  Yes [provider]  ibuprofen (ADVIL) 200 MG tablet Take 600 mg by mouth every 8 (eight) hours as needed for moderate pain.   Yes [provider]  losartan-hydrochlorothiazide (HYZAAR) 100-25 MG tablet Take 1 tablet by mouth daily. 12/10/17  Yes Hawks, Christy A, FNP  Multiple Vitamins-Minerals (MULTIVITAMIN WITH MINERALS) tablet Take 1 tablet by mouth daily.   Yes [provider]   Ursodiol 200 MG CAPS Take by mouth.   Yes [provider]   Allergies  Allergen Reactions  . Lisinopril Cough    Social History   Tobacco Use  . Smoking status: Former Smoker    Packs/day: 0.50    Years: 5.00    Pack years: 2.50    Types: Cigarettes    Quit date: 02/06/2013    Years since quitting: 7.4  . Smokeless tobacco: Never Used  Substance Use Topics  . Alcohol use: No    Family History  Problem Relation Age of Onset  . Hypertension Mother   . Diabetes Mellitus II Mother   . Osteoporosis Mother   . Fibroids Mother   . Diabetes Mother   . Hyperlipidemia Mother   . Obesity Mother   . Stroke Father        died from this  . Hypertension Father   . Lung cancer Maternal Aunt   . Fibroids Sister      Review of Systems  Positive ROS: neg  All other systems have been reviewed and were otherwise negative with the exception of those mentioned in the HPI and as above.  Objective: Vital signs in last 24 hours: Temp:  [97.6 F (36.4 C)] 97.6 F (36.4 C) (12/29 0601) Pulse Rate:  [78] 78 (12/29 0601) Resp:  [18] 18 (12/29 0601) BP: (129)/(96) 129/96 (12/29 0601) SpO2:  [96 %] 96 % (12/29 0601) Weight:  [123.4 kg] 123.4 kg (12/29 0601)  General Appearance: Alert, cooperative, no distress, appears stated age Head: Normocephalic, without obvious abnormality, atraumatic Eyes: PERRL, conjunctiva/corneas clear, EOM's intact    Neck: Supple, symmetrical, trachea midline Back: Symmetric, no curvature, ROM normal, no CVA tenderness Lungs:  respirations unlabored Heart: Regular rate and rhythm Abdomen: Soft, non-tender Extremities: Extremities normal, atraumatic, no cyanosis or edema Pulses: 2+ and symmetric all extremities Skin: Skin color, texture, turgor normal, no rashes or lesions  NEUROLOGIC:   Mental status: Alert and oriented x4,  no aphasia, good attention span, fund of knowledge, and memory Motor Exam - grossly normal Sensory Exam - grossly  normal Reflexes: 1+ Coordination - grossly normal Gait - grossly normal Balance - grossly normal Cranial Nerves: I: smell Not tested  II: visual acuity  OS: nl    OD: nl  II: visual fields Full to confrontation  II: pupils Equal, round, reactive to light  III,VII: ptosis None  III,IV,VI: extraocular muscles  Full ROM  V: mastication Normal  V: facial light touch sensation  Normal  V,VII: corneal reflex  Present  VII: facial muscle function - upper  Normal  VII: facial muscle function - lower Normal  VIII: hearing Not tested  IX: soft palate elevation  Normal  IX,X: gag reflex Present  XI: trapezius strength  5/5  XI: sternocleidomastoid strength 5/5  XI: neck flexion strength  5/5  XII: tongue strength  Normal    Data Review Lab Results  Component Value Date   WBC 7.4 06/27/2020   HGB 13.5 06/27/2020   HCT 42.2 06/27/2020   MCV 92.3 06/27/2020  PLT 230 06/27/2020   Lab Results  Component Value Date   NA 141 06/27/2020   K 3.2 (L) 06/27/2020   CL 104 06/27/2020   CO2 26 06/27/2020   BUN 10 06/27/2020   CREATININE 0.83 06/27/2020   GLUCOSE 117 (H) 06/27/2020   Lab Results  Component Value Date   INR 0.9 06/27/2020    Assessment/Plan:  Estimated body mass index is 40.17 kg/m as calculated from the following:   Height as of this encounter: 5\' 9"  (1.753 m).   Weight as of this encounter: 123.4 kg. Patient admitted for PLIF L4-5 L5-S1. Patient has failed a reasonable attempt at conservative therapy.  I explained the condition and procedure to the patient and answered any questions.  Patient wishes to proceed with procedure as planned. Understands risks/ benefits and typical outcomes of procedure.   Eustace Moore 07/06/2020 7:15 AM

## 2020-07-06 NOTE — Anesthesia Postprocedure Evaluation (Signed)
Anesthesia Post Note  Patient: Carol Vargas  Procedure(s) Performed: LUMBAR FOUR-FIVE LUMBAR FIVE-SACRAL ONE POSTERIOR LUMBAR INTERBODY FUSION (N/A Spine Lumbar)     Patient location during evaluation: PACU Anesthesia Type: General Level of consciousness: awake and alert Pain management: pain level controlled Vital Signs Assessment: post-procedure vital signs reviewed and stable Respiratory status: spontaneous breathing, nonlabored ventilation, respiratory function stable and patient connected to nasal cannula oxygen Cardiovascular status: blood pressure returned to baseline and stable Postop Assessment: no apparent nausea or vomiting Anesthetic complications: no   No complications documented.  Last Vitals:  Vitals:   07/06/20 1324 07/06/20 1355  BP: 116/67 107/72  Pulse: 85 69  Resp: 15 18  Temp:  36.5 C  SpO2: 94% 98%    Last Pain:  Vitals:   07/06/20 1504  TempSrc:   PainSc: 8                  Siya Flurry P Tobi Leinweber

## 2020-07-06 NOTE — Transfer of Care (Signed)
Immediate Anesthesia Transfer of Care Note  Patient: Carol Vargas  Procedure(s) Performed: LUMBAR FOUR-FIVE LUMBAR FIVE-SACRAL ONE POSTERIOR LUMBAR INTERBODY FUSION (N/A Spine Lumbar)  Patient Location: PACU  Anesthesia Type:General  Level of Consciousness: drowsy and patient cooperative  Airway & Oxygen Therapy: Patient Spontanous Breathing and Patient connected to face mask oxygen  Post-op Assessment: Report given to RN  Post vital signs: Reviewed and stable  Last Vitals:  Vitals Value Taken Time  BP 98/58 07/06/20 1155  Temp    Pulse 70 07/06/20 1155  Resp 0 07/06/20 1155  SpO2 100 % 07/06/20 1155  Vitals shown include unvalidated device data.  Last Pain:  Vitals:   07/06/20 0604  TempSrc:   PainSc: 4       Patients Stated Pain Goal: 2 (07/06/20 0604)  Complications: No complications documented.

## 2020-07-07 MED ORDER — OXYCODONE HCL 10 MG PO TABS: 10.0000 mg | ORAL_TABLET | ORAL | 0 refills | Status: DC | PRN

## 2020-07-07 MED ORDER — METHOCARBAMOL 500 MG PO TABS: 500.0000 mg | ORAL_TABLET | Freq: Four times a day (QID) | ORAL | 1 refills | Status: DC | PRN

## 2020-07-07 NOTE — Evaluation (Signed)
Occupational Therapy Evaluation Patient Details Name: Carol Vargas MRN: [id number] DOB: Sep 21, 1975 Today's Date: 07/07/2020    History of Present Illness Patient is a 44 y/o female with PMH significant for HTN, HLD, sjogrens syndrome. Patient s/p PLIF L4-S1 on 12/29.   Clinical Impression   Pt is at Mod I level with mobility using RW and with toilet transfers, min A with LB ADLs. Pt educated on ADL A/E for LB selfcare and toileting aid for home use. OT assisted pt with dressing this morning in prep to d/c home. Pt dons back brace Ind. Pt will have 24/7 sup and assist at home. All education completed and no further acute OT services are indicated at this time. OT will sign off    Follow Up Recommendations  No OT follow up    Equipment Recommendations  Other (comment) (ADL A/E kit, toileting aid)    Recommendations for Other Services       Precautions / Restrictions Precautions Precautions: Fall;Back Precaution Booklet Issued: Yes (comment) Precaution Comments: pt able to verbalize all back precautions and don/doff brace independently Required Braces or Orthoses: Spinal Brace Spinal Brace: Lumbar corset;Applied in sitting position      Mobility Bed Mobility Overal bed mobility: Modified Independent Bed Mobility: Rolling;Sidelying to Sit;Sit to Sidelying Rolling: Modified independent (Device/Increase time) Sidelying to sit: Modified independent (Device/Increase time)     Sit to sidelying: Modified independent (Device/Increase time) General bed mobility comments: pt sitting EOB upon arrival    Transfers Overall transfer level: Modified independent Equipment used: None;Rolling walker (2 wheeled) Transfers: Sit to/from Stand Sit to Stand: Modified independent (Device/Increase time)         General transfer comment: increased time    Balance Overall balance assessment: Needs assistance Sitting-balance support: No upper extremity supported;Feet supported Sitting  balance-Leahy Scale: Good     Standing balance support: No upper extremity supported Standing balance-Leahy Scale: Fair                             ADL either performed or assessed with clinical judgement   ADL Overall ADL's : Needs assistance/impaired Eating/Feeding: Independent   Grooming: Wash/dry hands;Wash/dry face;Modified independent;Standing   Upper Body Bathing: Set up;Independent   Lower Body Bathing: Minimal assistance;With caregiver independent assisting   Upper Body Dressing : Set up;Independent   Lower Body Dressing: Minimal assistance;With caregiver independent assisting   Toilet Transfer: Modified Independent;Ambulation;RW;Grab bars   Toileting- Clothing Manipulation and Hygiene: Modified independent       Functional mobility during ADLs: Modified independent;Rolling walker General ADL Comments: Pt educated on LB ADL A/E and toileting aid for home use     Vision Patient Visual Report: No change from baseline       Perception     Praxis      Pertinent Vitals/Pain Pain Assessment: 0-10 Pain Score: 3  Faces Pain Scale: Hurts little more Pain Location: back (incision site) Pain Descriptors / Indicators: Grimacing;Sore Pain Intervention(s): Monitored during session;Repositioned     Hand Dominance Right   Extremity/Trunk Assessment Upper Extremity Assessment Upper Extremity Assessment: Overall WFL for tasks assessed   Lower Extremity Assessment Lower Extremity Assessment: Defer to PT evaluation       Communication Communication Communication: No difficulties   Cognition Arousal/Alertness: Awake/alert Behavior During Therapy: WFL for tasks assessed/performed Overall Cognitive Status: Within Functional Limits for tasks assessed  General Comments  VSS on RA    Exercises     Shoulder Instructions      Home Living Family/patient expects to be discharged to:: Private  residence Living Arrangements: Spouse/significant other;Children;Parent Available Help at Discharge: Family;Available 24 hours/day Type of Home: House Home Access: Level entry     Home Layout: One level     Bathroom Shower/Tub: Occupational psychologist: Standard     Home Equipment: Shower seat - built in          Prior Functioning/Environment Level of Independence: Independent        Comments: currently working        OT Problem List: Decreased activity tolerance;Decreased knowledge of use of DME or AE      OT Treatment/Interventions:      OT Goals(Current goals can be found in the care plan section) Acute Rehab OT Goals Patient Stated Goal: to go home OT Goal Formulation: With patient/family  OT Frequency:     Barriers to D/C:            Co-evaluation              AM-PAC OT "6 Clicks" Daily Activity     Outcome Measure Help from another person eating meals?: None Help from another person taking care of personal grooming?: None Help from another person toileting, which includes using toliet, bedpan, or urinal?: None Help from another person bathing (including washing, rinsing, drying)?: A Little Help from another person to put on and taking off regular upper body clothing?: None Help from another person to put on and taking off regular lower body clothing?: A Little 6 Click Score: 22   End of Session Equipment Utilized During Treatment: Rolling walker;Back brace  Activity Tolerance: Patient tolerated treatment well Patient left: in bed;with call bell/phone within reach;with family/visitor present (sitting EOB)  OT Visit Diagnosis: Unsteadiness on feet (R26.81);Pain Pain - part of body:  (back)                Time: 8832-5498 OT Time Calculation (min): 39 min Charges:  OT General Charges $OT Visit: 1 Visit OT Evaluation $OT Eval Low Complexity: 1 Low OT Treatments $Self Care/Home Management : 23-37 mins    Britt Bottom 07/07/2020, 10:46 AM

## 2020-07-07 NOTE — Discharge Summary (Signed)
Physician Discharge Summary  Patient ID: Carol Vargas MRN: [id number] DOB/AGE: [birth year] 46 y.o.  Admit date: 07/06/2020 Discharge date: 07/07/2020  Admission Diagnoses: lumbar instability/ radiculopathy    Discharge Diagnoses: same   Discharged Condition: good  Hospital Course: The patient was admitted on 07/06/2020 and taken to the operating room where the patient underwent PLIF L4-5 L5-S1. The patient tolerated the procedure well and was taken to the recovery room and then to the floor in stable condition. The hospital course was routine. There were no complications. The wound remained clean dry and intact. Pt had appropriate back soreness. No complaints of leg pain or new N/T/W. The patient remained afebrile with stable vital signs, and tolerated a regular diet. The patient continued to increase activities, and pain was well controlled with oral pain medications.   Consults: None  Significant Diagnostic Studies:  Results for orders placed or performed during the hospital encounter of 07/04/20  SARS CORONAVIRUS 2 (TAT 6-24 HRS) Nasopharyngeal Nasopharyngeal Swab   Specimen: Nasopharyngeal Swab  Result Value Ref Range   SARS Coronavirus 2 NEGATIVE NEGATIVE    Chest 2 View  Result Date: 06/28/2020 CLINICAL DATA:  Preoperative, no chest complaint EXAM: CHEST - 2 VIEW COMPARISON:  Radiograph 12/24/2014 FINDINGS: No consolidation, features of edema, pneumothorax, or effusion. Pulmonary vascularity is normally distributed. The cardiomediastinal contours are unremarkable. No acute osseous or soft tissue abnormality. IMPRESSION: No acute cardiopulmonary abnormality. Electronically Signed   By: Lovena Le M.D.   On: 06/28/2020 02:22   DG Lumbar Spine 2-3 Views  Result Date: 07/06/2020 CLINICAL DATA:  Status post fusion EXAM: LUMBAR SPINE - 2-3 VIEW; DG C-ARM 1-60 MIN COMPARISON:  May 31, 2020 FLUOROSCOPY TIME:  1 minutes 13 seconds; 70.79 mGy; 2 acquired images FINDINGS: Frontal  and lateral views obtained. Patient is status post posterior screw and plate fixation at L4, L5, and S1 with disc spacers at L4-5 and L5-S1. No fracture. There is persistent grade I/IV anterolisthesis of L5 on S1 with fixation in this area. No new spondylolisthesis. Disc spaces appear unremarkable IMPRESSION: Status post posterior screw and plate fixation at L4, L5, and S1 with disc spacers at L4-5 and L5-S1. Support hardware intact. Stable spondylolisthesis at L5-S1 with fixation. Disc spaces appear unremarkable. No fracture. Electronically Signed   By: Lowella Grip III M.D.   On: 07/06/2020 11:39   DG C-Arm 1-60 Min  Result Date: 07/06/2020 CLINICAL DATA:  Status post fusion EXAM: LUMBAR SPINE - 2-3 VIEW; DG C-ARM 1-60 MIN COMPARISON:  May 31, 2020 FLUOROSCOPY TIME:  1 minutes 13 seconds; 70.79 mGy; 2 acquired images FINDINGS: Frontal and lateral views obtained. Patient is status post posterior screw and plate fixation at L4, L5, and S1 with disc spacers at L4-5 and L5-S1. No fracture. There is persistent grade I/IV anterolisthesis of L5 on S1 with fixation in this area. No new spondylolisthesis. Disc spaces appear unremarkable IMPRESSION: Status post posterior screw and plate fixation at L4, L5, and S1 with disc spacers at L4-5 and L5-S1. Support hardware intact. Stable spondylolisthesis at L5-S1 with fixation. Disc spaces appear unremarkable. No fracture. Electronically Signed   By: Lowella Grip III M.D.   On: 07/06/2020 11:39    Antibiotics:  Anti-infectives (From admission, onward)   Start     Dose/Rate Route Frequency Ordered Stop   07/06/20 1545  ceFAZolin (ANCEF) IVPB 2g/100 mL premix        2 g 200 mL/hr over 30 Minutes Intravenous Every 8 hours 07/06/20 1344 07/07/20  0105   07/06/20 0600  ceFAZolin (ANCEF) 3 g in dextrose 5 % 50 mL IVPB  Status:  Discontinued        3 g 100 mL/hr over 30 Minutes Intravenous On call to O.R. 07/05/20 1305 07/06/20 1344      Discharge  Exam: Blood pressure 100/63, pulse 89, temperature 98.8 F (37.1 C), temperature source Oral, resp. rate 20, height 5\' 9"  (1.753 m), weight 123.4 kg, last menstrual period 01/06/2017, SpO2 93 %. Neurologic: Grossly normal Dressing dry  Discharge Medications:   Allergies as of 07/07/2020      Reactions   Lisinopril Cough      Medication List    TAKE these medications   acetaminophen 500 MG tablet Commonly known as: TYLENOL Take 1,000 mg by mouth every 8 (eight) hours as needed for moderate pain.   amLODipine 10 MG tablet Commonly known as: NORVASC Take 10 mg by mouth daily.   atorvastatin 20 MG tablet Commonly known as: LIPITOR Take 20 mg by mouth daily.   celecoxib 200 MG capsule Commonly known as: CELEBREX Take 200 mg by mouth daily.   ibuprofen 200 MG tablet Commonly known as: ADVIL Take 600 mg by mouth every 8 (eight) hours as needed for moderate pain.   losartan-hydrochlorothiazide 100-25 MG tablet Commonly known as: HYZAAR Take 1 tablet by mouth daily.   methocarbamol 500 MG tablet Commonly known as: ROBAXIN Take 1 tablet (500 mg total) by mouth every 6 (six) hours as needed for muscle spasms.   multivitamin with minerals tablet Take 1 tablet by mouth daily.   Oxycodone HCl 10 MG Tabs Take 1 tablet (10 mg total) by mouth every 4 (four) hours as needed for severe pain ((score 7 to 10)).   Restasis 0.05 % ophthalmic emulsion Generic drug: cycloSPORINE Place 1 drop into both eyes 2 (two) times daily as needed (dry eyes).   Ursodiol 200 MG Caps Take by mouth.            Durable Medical Equipment  (From admission, onward)         Start     Ordered   07/06/20 1345  DME Walker rolling  Once       Question:  Patient needs a walker to treat with the following condition  Answer:  S/P lumbar fusion   07/06/20 1344   07/06/20 1345  DME 3 n 1  Once        07/06/20 1344          Disposition: home   Final Dx: PLIF L4-5 L5-s1  Discharge  Instructions     Remove dressing in 72 hours   Complete by: As directed    Call MD for:  difficulty breathing, headache or visual disturbances   Complete by: As directed    Call MD for:  persistant nausea and vomiting   Complete by: As directed    Call MD for:  redness, tenderness, or signs of infection (pain, swelling, redness, odor or green/yellow discharge around incision site)   Complete by: As directed    Call MD for:  severe uncontrolled pain   Complete by: As directed    Call MD for:  temperature >100.4   Complete by: As directed    Diet - low sodium heart healthy   Complete by: As directed    Increase activity slowly   Complete by: As directed        Follow-up Information    07/08/20, MD Follow up  in 2 week(s).   Specialty: Neurosurgery Contact information: [address] [place] [address] [place] [address] [phone]                Signed: Eustace Moore 07/07/2020, 7:46 AM

## 2020-07-07 NOTE — Progress Notes (Signed)
Physical Therapy Treatment Patient Details Name: Carol Vargas MRN: [id number] DOB: 11/30/1975 Today's Date: 07/07/2020    History of Present Illness Patient is a 44 y/o female with PMH significant for HTN, HLD, sjogrens syndrome. Patient s/p PLIF L4-S1 on 12/29.    PT Comments    Pt tolerates treatment well with much improved activity tolerance and gait quality. Pt benefits from use of RW at this time to improve stability with ambulation. Pt abides by back precautions well and is able to perform bed mobility in compliance with these precautions. Pt will benefit from outpatient PT at the time of discharge as well as a RW. Pt has met all acute PT goals and has no further acute PT needs at this time. Acute PT signing off.   Follow Up Recommendations  Outpatient PT     Equipment Recommendations  Rolling walker with 5" wheels    Recommendations for Other Services       Precautions / Restrictions Precautions Precautions: Fall;Back Precaution Booklet Issued: Yes (comment) Precaution Comments: pt able to verbalize all back precautions and don/doff brace independently Required Braces or Orthoses: Spinal Brace Spinal Brace: Lumbar corset;Applied in sitting position    Mobility  Bed Mobility Overal bed mobility: Modified Independent Bed Mobility: Rolling;Sidelying to Sit;Sit to Sidelying Rolling: Modified independent (Device/Increase time) Sidelying to sit: Modified independent (Device/Increase time)     Sit to sidelying: Modified independent (Device/Increase time) General bed mobility comments: PT provides cues for technique  Transfers Overall transfer level: Modified independent Equipment used: None;Rolling walker (2 wheeled) Transfers: Sit to/from Stand Sit to Stand: Modified independent (Device/Increase time)         General transfer comment: increased time  Ambulation/Gait Ambulation/Gait assistance: Supervision;Modified independent (Device/Increase time) Gait  Distance (Feet): 150 Feet (50' without device supervision) Assistive device: Rolling walker (2 wheeled);None Gait Pattern/deviations: Step-through pattern Gait velocity: functional Gait velocity interpretation: 1.31 - 2.62 ft/sec, indicative of limited community ambulator General Gait Details: pt with short step-to gait with increased lateral sway without device. Pt with improved stability with BUE support of RW, increased step length and gait speed   Stairs Stairs: Yes Stairs assistance: Modified independent (Device/Increase time) Stair Management: No rails;Backwards;Step to pattern;With walker Number of Stairs: 1     Wheelchair Mobility    Modified Rankin (Stroke Patients Only)       Balance Overall balance assessment: Needs assistance Sitting-balance support: No upper extremity supported;Feet supported Sitting balance-Leahy Scale: Good     Standing balance support: No upper extremity supported Standing balance-Leahy Scale: Fair                              Cognition Arousal/Alertness: Awake/alert Behavior During Therapy: WFL for tasks assessed/performed Overall Cognitive Status: Within Functional Limits for tasks assessed                                        Exercises      General Comments General comments (skin integrity, edema, etc.): VSS on RA      Pertinent Vitals/Pain Pain Assessment: Faces Faces Pain Scale: Hurts little more Pain Location: back (incision site) Pain Descriptors / Indicators: Grimacing Pain Intervention(s): Monitored during session    Home Living                      Prior Function  PT Goals (current goals can now be found in the care plan section) Acute Rehab PT Goals Patient Stated Goal: to go home Progress towards PT goals: Goals met/education completed, patient discharged from PT    Frequency           PT Plan Discharge plan needs to be updated    Co-evaluation               AM-PAC PT "6 Clicks" Mobility   Outcome Measure  Help needed turning from your back to your side while in a flat bed without using bedrails?: None Help needed moving from lying on your back to sitting on the side of a flat bed without using bedrails?: None Help needed moving to and from a bed to a chair (including a wheelchair)?: None Help needed standing up from a chair using your arms (e.g., wheelchair or bedside chair)?: None Help needed to walk in hospital room?: None Help needed climbing 3-5 steps with a railing? : None 6 Click Score: 24    End of Session Equipment Utilized During Treatment: Back brace Activity Tolerance: Patient tolerated treatment well Patient left: in bed;with call bell/phone within reach Nurse Communication: Mobility status PT Visit Diagnosis: Unsteadiness on feet (R26.81);Muscle weakness (generalized) (M62.81)     Time: 8592-9244 PT Time Calculation (min) (ACUTE ONLY): 24 min  Charges:  $Gait Training: 8-22 mins $Therapeutic Activity: 8-22 mins                     Zenaida Niece, PT, DPT Acute Rehabilitation Pager: [phone]    Zenaida Niece 07/07/2020, 10:09 AM

## 2020-07-07 NOTE — Progress Notes (Signed)
Patient is discharged from room 3C10 at this time. Alert and in stable condition. IV site d/c'd and instructions read to patient with understanding verbalized and all questions answered. Left uit via wheelchair with all belongings at side.

## 2020-07-07 NOTE — Discharge Instructions (Signed)

## 2020-08-18 DIAGNOSIS — M5416 Radiculopathy, lumbar region: Secondary | ICD-10-CM | POA: Diagnosis not present

## 2020-08-28 DIAGNOSIS — Z6839 Body mass index (BMI) 39.0-39.9, adult: Secondary | ICD-10-CM | POA: Diagnosis not present

## 2020-08-28 DIAGNOSIS — N914 Secondary oligomenorrhea: Secondary | ICD-10-CM | POA: Diagnosis not present

## 2020-08-28 DIAGNOSIS — M25512 Pain in left shoulder: Secondary | ICD-10-CM | POA: Diagnosis not present

## 2020-08-28 DIAGNOSIS — M25561 Pain in right knee: Secondary | ICD-10-CM | POA: Diagnosis not present

## 2020-08-28 DIAGNOSIS — Z79899 Other long term (current) drug therapy: Secondary | ICD-10-CM | POA: Diagnosis not present

## 2020-08-28 DIAGNOSIS — R030 Elevated blood-pressure reading, without diagnosis of hypertension: Secondary | ICD-10-CM | POA: Diagnosis not present

## 2020-08-28 DIAGNOSIS — G8929 Other chronic pain: Secondary | ICD-10-CM | POA: Diagnosis not present

## 2020-09-08 DIAGNOSIS — R030 Elevated blood-pressure reading, without diagnosis of hypertension: Secondary | ICD-10-CM | POA: Diagnosis not present

## 2020-09-08 DIAGNOSIS — Z6839 Body mass index (BMI) 39.0-39.9, adult: Secondary | ICD-10-CM | POA: Diagnosis not present

## 2020-09-08 DIAGNOSIS — M5416 Radiculopathy, lumbar region: Secondary | ICD-10-CM | POA: Diagnosis not present

## 2020-09-14 ENCOUNTER — Other Ambulatory Visit (HOSPITAL_COMMUNITY): Payer: Self-pay | Admitting: Physician Assistant

## 2020-09-14 ENCOUNTER — Other Ambulatory Visit: Payer: Self-pay | Admitting: Physician Assistant

## 2020-09-14 DIAGNOSIS — M25561 Pain in right knee: Secondary | ICD-10-CM | POA: Diagnosis not present

## 2020-09-14 DIAGNOSIS — M25562 Pain in left knee: Secondary | ICD-10-CM | POA: Diagnosis not present

## 2020-09-27 ENCOUNTER — Ambulatory Visit (HOSPITAL_COMMUNITY)
Admission: RE | Admit: 2020-09-27 | Discharge: 2020-09-27 | Disposition: A | Discharge status: Home / Self Care | Payer: BC Managed Care – PPO | Source: Ambulatory Visit | Attending: Physician Assistant | Admitting: Physician Assistant

## 2020-09-27 ENCOUNTER — Other Ambulatory Visit: Payer: Self-pay

## 2020-09-27 ENCOUNTER — Encounter (HOSPITAL_COMMUNITY)
Admission: RE | Admit: 2020-09-27 | Discharge: 2020-09-27 | Disposition: A | Discharge status: Home / Self Care | Payer: BC Managed Care – PPO | Source: Ambulatory Visit | Attending: Physician Assistant | Admitting: Physician Assistant

## 2020-09-27 DIAGNOSIS — M7989 Other specified soft tissue disorders: Secondary | ICD-10-CM | POA: Diagnosis not present

## 2020-09-27 DIAGNOSIS — M25561 Pain in right knee: Secondary | ICD-10-CM | POA: Diagnosis not present

## 2020-09-27 IMAGING — NM NM BONE 3 PHASE
10 series · 20 of 20 positions shown · non-contrast
Comparison: None

Radiographic correlation: None since surgery since surgery

CLINICAL DATA: Pain and swelling in RIGHT knee since RIGHT knee
replacement in 8400. No history of trauma

EXAM:
NUCLEAR MEDICINE 3-PHASE BONE SCAN
TECHNIQUE: Radionuclide angiographic images, immediate static blood pool
images, and 3-hour delayed static images were obtained of the knees
after intravenous injection of radiopharmaceutical.
RADIOPHARMACEUTICALS:  21.9 mCi 1c-VVm MDP IV

[Series 1: flow · 2.07mm/px · 6 of 48 frames shown (1 of 2)]
[frame 5/48  full-range]
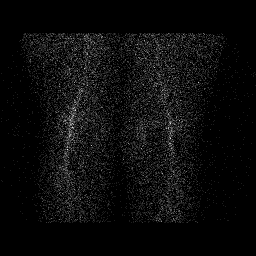
[frame 13/48  full-range]
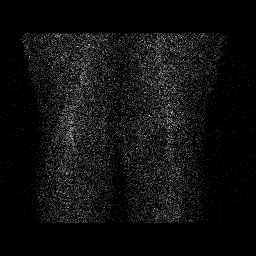
[frame 21/48  full-range]
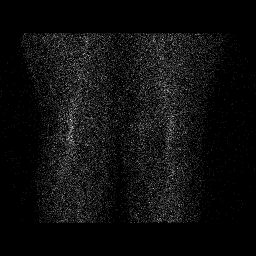
[frame 29/48  full-range]
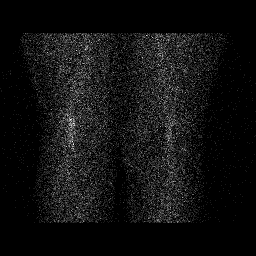
[frame 37/48  full-range]
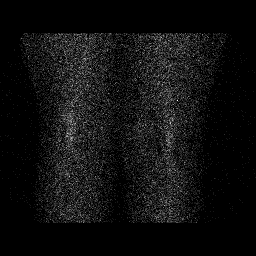
[frame 45/48  full-range]
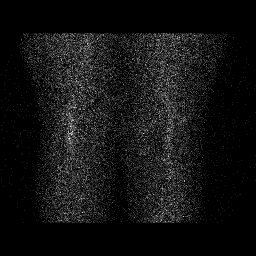

[Series 1: flow · 2.07mm/px · 6 of 48 frames shown (2 of 2)]
[frame 5/48  full-range]
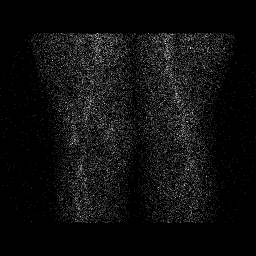
[frame 13/48  full-range]
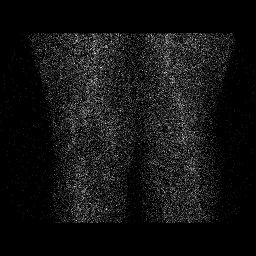
[frame 21/48  full-range]
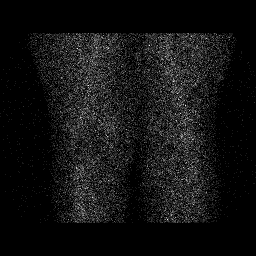
[frame 29/48  full-range]
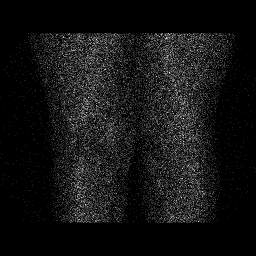
[frame 37/48  full-range]
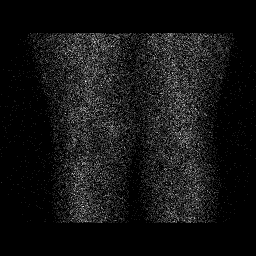
[frame 45/48  full-range]
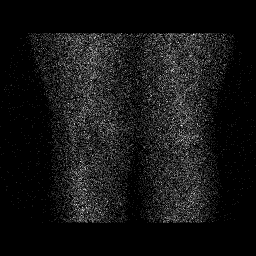

[Series 2: blood pool · 2.07mm/px · 1 of 1 slices shown (1 of 2)]
[im 1/1  full-range]
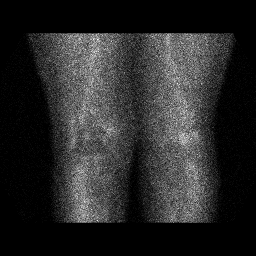

[Series 2: blood pool · 2.07mm/px · 1 of 1 slices shown (2 of 2)]
[im 1/1  full-range]
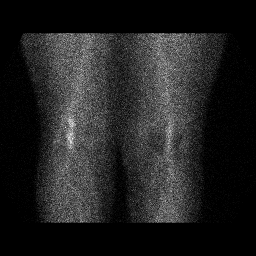

[Series 3: lat bp · 2.07mm/px · 1 of 1 slices shown (1 of 2)]
[im 1/1  full-range]
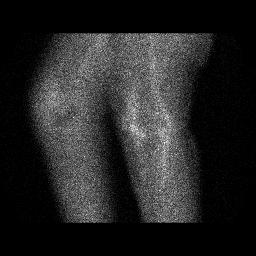

[Series 3: lat bp · 2.07mm/px · 1 of 1 slices shown (2 of 2)]
[im 1/1  full-range]
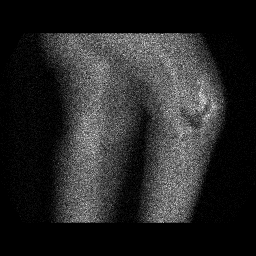

[Series 4: delay · delayed · 2.07mm/px · 1 of 1 slices shown (1 of 4)]
[im 1/1]
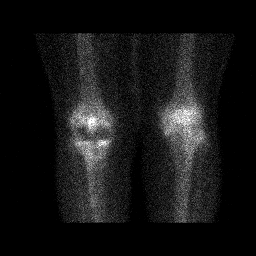

[Series 4: delay · delayed · 2.07mm/px · 1 of 1 slices shown (2 of 4)]
[im 1/1  full-range]
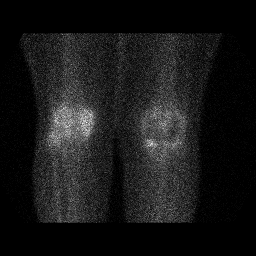

[Series 5: delay · delayed · 2.07mm/px · 1 of 1 slices shown (3 of 4)]
[im 1/1  full-range]
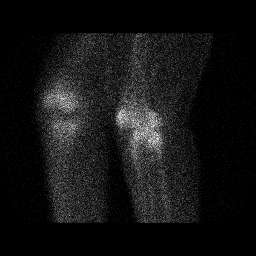

[Series 5: delay · delayed · 2.07mm/px · 1 of 1 slices shown (4 of 4)]
[im 1/1  full-range]
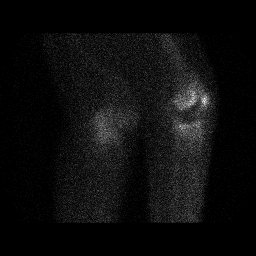

[20 of 20 positions shown; findings below may reference images not displayed]

FINDINGS: Vascular phase: Minimally increased blood flow to periarticular
regions of RIGHT knee.

Blood pool phase: Mildly increased blood pool surrounding RIGHT knee
joint.

Delayed phase: Uptake of tracer at LEFT knee consistent with
degenerative changes. Photopenic defect at RIGHT knee from
prosthetic components. Focal increased tracer uptake is seen at the
RIGHT medial tibial plateau adjacent to the prosthesis suspicious
for aseptic loosening. No abnormal tracer localization adjacent to
the femoral component of the RIGHT knee prosthesis.
IMPRESSION: Focal delayed abnormal tracer uptake at the RIGHT medial tibial
plateau adjacent to the tibial component of the RIGHT knee
prosthesis suspicious for aseptic loosening of the prosthesis.

Mild degenerative type uptake of tracer at LEFT knee joint.

## 2020-09-27 MED ORDER — TECHNETIUM TC 99M MEDRONATE IV KIT
20.0000 | PACK | Freq: Once | INTRAVENOUS | Status: AC | PRN
  Administered 2020-09-27: 21.9 via INTRAVENOUS

## 2020-09-29 DIAGNOSIS — M25562 Pain in left knee: Secondary | ICD-10-CM | POA: Diagnosis not present

## 2020-09-29 DIAGNOSIS — M25561 Pain in right knee: Secondary | ICD-10-CM | POA: Diagnosis not present

## 2020-10-05 DIAGNOSIS — T8450XD Infection and inflammatory reaction due to unspecified internal joint prosthesis, subsequent encounter: Secondary | ICD-10-CM | POA: Diagnosis not present

## 2020-10-05 DIAGNOSIS — M25561 Pain in right knee: Secondary | ICD-10-CM | POA: Diagnosis not present

## 2020-10-10 ENCOUNTER — Ambulatory Visit (INDEPENDENT_AMBULATORY_CARE_PROVIDER_SITE_OTHER): Payer: BC Managed Care – PPO | Admitting: Family

## 2020-10-10 ENCOUNTER — Other Ambulatory Visit: Payer: Self-pay

## 2020-10-10 ENCOUNTER — Encounter: Payer: Self-pay | Admitting: Family

## 2020-10-10 VITALS — BP 125/84 | HR 63 | Temp 97.4°F | Ht 69.0 in | Wt 267.4 lb

## 2020-10-10 DIAGNOSIS — Z01818 Encounter for other preprocedural examination: Secondary | ICD-10-CM

## 2020-10-10 DIAGNOSIS — M25561 Pain in right knee: Secondary | ICD-10-CM | POA: Diagnosis not present

## 2020-10-10 NOTE — Patient Instructions (Signed)
Preparing for Knee Replacement Getting prepared before knee replacement surgery can make recovery easier and more comfortable. This document provides some tips and guidelines that will help you prepare for your surgery. Talk with your health care provider so you can learn what to expect before, during, and after surgery. Ask questions if you do not understand something. Tell a health care provider about:  Any allergies you have.  All medicines you are taking, including vitamins, herbs, eye drops, creams, and over-the-counter medicines.  Any problems you or family members have had with anesthetic medicines.  Any blood disorders you have.  Any surgeries you have had.  Any medical conditions you have.  Whether you are pregnant or may be pregnant. What happens before the procedure? Visit your health care providers Keep all appointments before surgery. You will need to have a physical exam before surgery (preoperative exam) to make sure it is safe for you to have knee replacement surgery. You may also need to have more tests. When you go to the exam, bring a list of all the medicines and supplements, including herbs and vitamins, that you take. Have dental care and routine cleanings done before surgery. Germs from anywhere in your body, including your mouth, can travel to your new joint and infect it. Tell your dentist that you plan to have knee replacement surgery. Know the costs of surgery To find out how much the surgery will cost, call your insurance company as soon as you decide to have surgery. Ask questions like:  How much of the surgery and hospital stay will be covered?  What will be covered for: ? Medical equipment? ? Rehabilitation facilities? ? Home care? Prepare your home  Pick a recovery spot that is not your bed. It is better that you sit more upright during recovery. You may want to use a recliner.  Place items that you often use on a small table near your recovery spot.  These may include the TV remote, a cordless phone or your mobile phone, a book, a laptop computer, and a water glass.  Move other items you will need to shelves and drawers that are at countertop height. Do this in your kitchen, bathroom, and bedroom.  You may be given a walker to use at home. Check if you will have enough room to use a walker. Move around your home with your hands out about 6 inches (15 cm) from your sides. You will have enough room if you do not hit anything with your hands as you do this. Walk from: ? Your recovery spot to your kitchen and bathroom. ? Your bed to the bathroom.  Prepare some meals to freeze and reheat later. Make your home safe for recovery  Remove all clutter and throw rugs from your floors. This will help you avoid tripping.  Consider getting safety equipment that will be helpful during your recovery, such as: ? Grab bars in the shower and near the toilet. ? A raised toilet seat. This will help you get on and off the toilet more easily. ? A tub or shower bench.      Prepare your body  If you smoke, quit as soon as you can before surgery. If there is time, it is best to quit several months before surgery. Tell your surgeon if you use any products that contain nicotine or tobacco. These products include cigarettes, chewing tobacco, and vaping devices, such as e-cigarettes. These can delay healing. If you need help quitting, ask your health  care provider.  Talk to your health care provider about doing exercises before your surgery. ? Doing these exercises in the weeks before your surgery may help reduce pain and improve function after surgery. ? Be sure to follow the exercise program given by your health care provider.  Maintain a healthy diet. Do not change your diet before surgery unless your health care provider tells you to do that.  Do not drink any alcohol for at least 48 hours before surgery. Plan your recovery In the first couple of weeks  after surgery, it will be harder for you to do some of your regular activities. You may get tired easily, and you will have limited movement in your leg. To make sure you have all the help you need after your surgery:  Plan to have a responsible adult take you home from the hospital. Your health care provider will tell you how many days you can expect to be in the hospital.  Cancel all work, caregiving, and volunteer responsibilities for at least 4-6 weeks after surgery.  Plan to have a responsible adult stay with you day and night for the first week. This person should be someone you are comfortable with. You may need this person to help you with your exercises and personal care, such as bathing and using the toilet.  If you live alone, arrange for someone to take care of your home and pets for the first 4-6 weeks after surgery.  Arrange for drivers to take you to follow-up visits, the grocery store, and other places you may need to go for at least 4-6 weeks.  Consider applying for a disability parking permit. To get an application, call your local department of motor vehicles [facility]) or your health care provider's office. Summary  Getting prepared before knee replacement surgery can make your recovery easier and more comfortable.  Keep all visits to your health care provider before surgery. You will have an exam and may have tests to make sure that you are ready for your surgery.  Prepare your home and arrange for help at home.  Plan to have a responsible adult take you home from the hospital. Also, plan to have a responsible adult stay with you day and night for the first week after you leave the hospital. This information is not intended to replace advice given to you by your health care provider. Make sure you discuss any questions you have with your health care provider. Document Revised: 12/15/2019 Document Reviewed: 12/15/2019 Elsevier Patient Education  [place].

## 2020-10-10 NOTE — Progress Notes (Signed)
   Subjective:    Patient ID: Carol Vargas, female    DOB: Jul 06, 1976, 45 y.o.   MRN: [id number]  Chief Complaint  Patient presents with  . surgical clearance   PT presents to the office for pre-op exam. She is scheduled for right  total knee revision on 11/08/20.  Knee Pain  The incident occurred more than 1 week ago. There was no injury mechanism. The pain is present in the right knee. The quality of the pain is described as aching. The pain is at a severity of 3/10 (walking 4-5). The pain is mild. The pain has been worsening since onset. Pertinent negatives include no numbness or tingling. She reports no foreign bodies present. The symptoms are aggravated by weight bearing and movement. She has tried acetaminophen and NSAIDs for the symptoms. The treatment provided mild relief.      Review of Systems  Musculoskeletal: Positive for arthralgias.  Neurological: Negative for tingling and numbness.  All other systems reviewed and are negative.      Objective:   Physical Exam Vitals reviewed.  Constitutional:      General: She is not in acute distress.    Appearance: She is well-developed.  HENT:     Head: Normocephalic and atraumatic.     Right Ear: Tympanic membrane normal.     Left Ear: Tympanic membrane normal.  Eyes:     Pupils: Pupils are equal, round, and reactive to light.  Neck:     Thyroid: No thyromegaly.  Cardiovascular:     Rate and Rhythm: Normal rate and regular rhythm.     Heart sounds: Normal heart sounds. No murmur heard.   Pulmonary:     Effort: Pulmonary effort is normal. No respiratory distress.     Breath sounds: Normal breath sounds. No wheezing.  Abdominal:     General: Bowel sounds are normal. There is no distension.     Palpations: Abdomen is soft.     Tenderness: There is no abdominal tenderness.  Musculoskeletal:        General: No tenderness.     Cervical back: Normal range of motion and neck supple.     Comments: Pain in right knee with  walking  Skin:    General: Skin is warm and dry.  Neurological:     Mental Status: She is alert and oriented to person, place, and time.     Cranial Nerves: No cranial nerve deficit.     Deep Tendon Reflexes: Reflexes are normal and symmetric.  Psychiatric:        Behavior: Behavior normal.        Thought Content: Thought content normal.        Judgment: Judgment normal.        BP 125/84   Pulse 63   Temp (!) 97.4 F (36.3 C)   Ht _0  (1.753 m)   Wt 267 lb 6.4 oz (121.3 kg)   LMP 01/06/2017 (Exact Date)   SpO2 100%   BMI 39.49 kg/m   Assessment & Plan:  Carol Vargas comes in today with chief complaint of surgical clearance   Diagnosis and orders addressed:  1. Pre-op evaluation - EKG 12-Lead - CMP14+EGFR - CBC with Differential/Platelet  2. Acute pain of right knee - CMP14+EGFR - CBC with Differential/Platelet  3. Morbid obesity ([place])   Labs pending Health Maintenance reviewed Diet and exercise encouraged  Follow up plan: As needed   Evelina Dun, FNP

## 2020-10-11 LAB — CBC WITH DIFFERENTIAL/PLATELET
Basophils Absolute: 0 10*3/uL (ref 0.0–0.2)
Basos: 1 %
EOS (ABSOLUTE): 0.1 10*3/uL (ref 0.0–0.4)
Eos: 2 %
Hematocrit: 42 % (ref 34.0–46.6)
Hemoglobin: 13.7 g/dL (ref 11.1–15.9)
Immature Grans (Abs): 0 10*3/uL (ref 0.0–0.1)
Immature Granulocytes: 0 %
Lymphocytes Absolute: 2 10*3/uL (ref 0.7–3.1)
Lymphs: 30 %
MCH: 29.9 pg (ref 26.6–33.0)
MCHC: 32.6 g/dL (ref 31.5–35.7)
MCV: 92 fL (ref 79–97)
Monocytes Absolute: 0.7 10*3/uL (ref 0.1–0.9)
Monocytes: 10 %
Neutrophils Absolute: 3.9 10*3/uL (ref 1.4–7.0)
Neutrophils: 57 %
Platelets: 230 10*3/uL (ref 150–450)
RBC: 4.58 x10E6/uL (ref 3.77–5.28)
RDW: 12.6 % (ref 11.7–15.4)
WBC: 6.8 10*3/uL (ref 3.4–10.8)

## 2020-10-11 LAB — CMP14+EGFR
ALT: 17 IU/L (ref 0–32)
AST: 18 IU/L (ref 0–40)
Albumin/Globulin Ratio: 1.6 (ref 1.2–2.2)
Albumin: 4.5 g/dL (ref 3.8–4.8)
Alkaline Phosphatase: 121 IU/L (ref 44–121)
BUN/Creatinine Ratio: 14 (ref 9–23)
BUN: 12 mg/dL (ref 6–24)
Bilirubin Total: 0.2 mg/dL (ref 0.0–1.2)
CO2: 27 mmol/L (ref 20–29)
Calcium: 9.5 mg/dL (ref 8.7–10.2)
Chloride: 101 mmol/L (ref 96–106)
Creatinine, Ser: 0.87 mg/dL (ref 0.57–1.00)
Globulin, Total: 2.9 g/dL (ref 1.5–4.5)
Glucose: 75 mg/dL (ref 65–99)
Potassium: 3.7 mmol/L (ref 3.5–5.2)
Sodium: 144 mmol/L (ref 134–144)
Total Protein: 7.4 g/dL (ref 6.0–8.5)
eGFR: 83 mL/min/{1.73_m2} (ref >59)

## 2020-10-19 ENCOUNTER — Telehealth: Payer: Self-pay

## 2020-10-19 NOTE — Telephone Encounter (Signed)
Scanned in chart - please fax

## 2020-10-24 NOTE — Telephone Encounter (Signed)
faxed

## 2020-10-31 NOTE — Progress Notes (Signed)
Please place orders in epic pt. Is scheduled for a preop  

## 2020-10-31 NOTE — Patient Instructions (Addendum)
DUE TO COVID-19 ONLY ONE VISITOR IS ALLOWED TO COME WITH YOU AND STAY IN THE WAITING ROOM ONLY DURING PRE OP AND PROCEDURE DAY OF SURGERY.   TWO  VISITOR  MAY VISIT WITH YOU AFTER SURGERY IN YOUR PRIVATE ROOM DURING VISITING HOURS ONLY!  YOU NEED TO HAVE A COVID 19 TEST ON__4-29-22_____ @_______ , THIS TEST MUST BE DONE BEFORE SURGERY,  COVID TESTING SITE   [address] [place] [address],   IT IS ON THE RIGHT GOING OUT [address] APPROXIMATELY  2 MINUTES PAST [employer] ON THE RIGHT. ONCE YOUR COVID TEST IS COMPLETED,  PLEASE BEGIN THE QUARANTINE INSTRUCTIONS AS OUTLINED IN YOUR HANDOUT.                Carol Vargas  10/31/2020   Your procedure is scheduled on: 11-08-20   Report to [facility] Main  Entrance   Report to short stay  at       [place]   AM     Call this number if you have problems the morning of surgery [phone]    Remember: NO SOLID FOOD AFTER MIDNIGHT THE NIGHT PRIOR TO SURGERY. NOTHING BY MOUTH EXCEPT CLEAR LIQUIDS UNTIL    0415 am   .   PLEASE FINISH ENSURE DRINK PER SURGEON ORDER  WHICH NEEDS TO BE COMPLETED AT       [facility] am then nothing by mouth .    CLEAR LIQUID DIET   Foods Allowed                                                                                    Foods Excluded  Black Coffee and tea, regular and decaf                                     liquids that you cannot  Plain Jell-O any favor except red or purple                                           see through such as: Fruit ices (not with fruit pulp)                                                          milk, soups, orange juice  Iced Popsicles                                                      All solid food Carbonated beverages, regular and diet  Cranberry, grape and apple juices Sports drinks like Gatorade Lightly seasoned clear broth or consume(fat free) Sugar, honey  syrup    _____________________________________________________________________   BRUSH YOUR TEETH MORNING OF SURGERY AND RINSE YOUR MOUTH OUT, NO CHEWING GUM CANDY OR MINTS.     Take these medicines the morning of surgery with A SIP OF WATER: ursodiol, eye drops, amlodipine  DO NOT TAKE ANY DIABETIC MEDICATIONS DAY OF YOUR SURGERY                               You may not have any metal on your body including hair pins and              piercings  Do not wear jewelry, make-up, lotions, powders or perfumes, deodorant             Do not wear nail polish on your fingernails.  Do not shave  48 hours prior to surgery.     Do not bring valuables to the hospital. [place].  Contacts, dentures or bridgework may not be worn into surgery.      Patients discharged the day of surgery will not be allowed to drive home. IF YOU ARE HAVING SURGERY AND GOING HOME THE SAME DAY, YOU MUST HAVE AN ADULT TO DRIVE YOU HOME AND BE WITH YOU FOR 24 HOURS. YOU MAY GO HOME BY TAXI OR UBER OR ORTHERWISE, BUT AN ADULT MUST ACCOMPANY YOU HOME AND STAY WITH YOU FOR 24 HOURS.  Name and phone number of your driver:  Special Instructions: N/A              Please read over the following fact sheets you were given: _____________________________________________________________________             [facility] - Preparing for Surgery Before surgery, you can play an important role.  Because skin is not sterile, your skin needs to be as free of germs as possible.  You can reduce the number of germs on your skin by washing with CHG (chlorahexidine gluconate) soap before surgery.  CHG is an antiseptic cleaner which kills germs and bonds with the skin to continue killing germs even after washing. Please DO NOT use if you have an allergy to CHG or antibacterial soaps.  If your skin becomes reddened/irritated stop using the CHG and inform your nurse when you arrive at Short  Stay. Do not shave (including legs and underarms) for at least 48 hours prior to the first CHG shower.  You may shave your face/neck. Please follow these instructions carefully:  1.  Shower with CHG Soap the night before surgery and the  morning of Surgery.  2.  If you choose to wash your hair, wash your hair first as usual with your  normal  shampoo.  3.  After you shampoo, rinse your hair and body thoroughly to remove the  shampoo.                           4.  Use CHG as you would any other liquid soap.  You can apply chg directly  to the skin and wash                       Gently with a scrungie or  clean washcloth.  5.  Apply the CHG Soap to your body ONLY FROM THE NECK DOWN.   Do not use on face/ open                           Wound or open sores. Avoid contact with eyes, ears mouth and genitals (private parts).                       Wash face,  Genitals (private parts) with your normal soap.             6.  Wash thoroughly, paying special attention to the area where your surgery  will be performed.  7.  Thoroughly rinse your body with warm water from the neck down.  8.  DO NOT shower/wash with your normal soap after using and rinsing off  the CHG Soap.                9.  Pat yourself dry with a clean towel.            10.  Wear clean pajamas.            11.  Place clean sheets on your bed the night of your first shower and do not  sleep with pets. Day of Surgery : Do not apply any lotions/deodorants the morning of surgery.  Please wear clean clothes to the hospital/surgery center.  FAILURE TO FOLLOW THESE INSTRUCTIONS MAY RESULT IN THE CANCELLATION OF YOUR SURGERY PATIENT SIGNATURE_________________________________  NURSE SIGNATURE__________________________________  ________________________________________________________________________   Adam Phenix  An incentive spirometer is a tool that can help keep your lungs clear and active. This tool measures how well you are  filling your lungs with each breath. Taking long deep breaths may help reverse or decrease the chance of developing breathing (pulmonary) problems (especially infection) following:  A long period of time when you are unable to move or be active. BEFORE THE PROCEDURE   If the spirometer includes an indicator to show your best effort, your nurse or respiratory therapist will set it to a desired goal.  If possible, sit up straight or lean slightly forward. Try not to slouch.  Hold the incentive spirometer in an upright position. INSTRUCTIONS FOR USE  1. Sit on the edge of your bed if possible, or sit up as far as you can in bed or on a chair. 2. Hold the incentive spirometer in an upright position. 3. Breathe out normally. 4. Place the mouthpiece in your mouth and seal your lips tightly around it. 5. Breathe in slowly and as deeply as possible, raising the piston or the ball toward the top of the column. 6. Hold your breath for 3-5 seconds or for as long as possible. Allow the piston or ball to fall to the bottom of the column. 7. Remove the mouthpiece from your mouth and breathe out normally. 8. Rest for a few seconds and repeat Steps 1 through 7 at least 10 times every 1-2 hours when you are awake. Take your time and take a few normal breaths between deep breaths. 9. The spirometer may include an indicator to show your best effort. Use the indicator as a goal to work toward during each repetition. 10. After each set of 10 deep breaths, practice coughing to be sure your lungs are clear. If you have an incision (the cut made at the time of surgery), support your incision when coughing  by placing a pillow or rolled up towels firmly against it. Once you are able to get out of bed, walk around indoors and cough well. You may stop using the incentive spirometer when instructed by your caregiver.  RISKS AND COMPLICATIONS  Take your time so you do not get dizzy or light-headed.  If you are in pain,  you may need to take or ask for pain medication before doing incentive spirometry. It is harder to take a deep breath if you are having pain. AFTER USE  Rest and breathe slowly and easily.  It can be helpful to keep track of a log of your progress. Your caregiver can provide you with a simple table to help with this. If you are using the spirometer at home, follow these instructions: [place] IF:   You are having difficultly using the spirometer.  You have trouble using the spirometer as often as instructed.  Your pain medication is not giving enough relief while using the spirometer.  You develop fever of 100.5 F (38.1 C) or higher. SEEK IMMEDIATE MEDICAL CARE IF:   You cough up bloody sputum that had not been present before.  You develop fever of 102 F (38.9 C) or greater.  You develop worsening pain at or near the incision site. MAKE SURE YOU:   Understand these instructions.  Will watch your condition.  Will get help right away if you are not doing well or get worse. Document Released: 11/05/2006 Document Revised: 09/17/2011 Document Reviewed: 01/06/2007 [facility] Patient Information 2014 [place], [place].   ________________________________________________________________________

## 2020-10-31 NOTE — Progress Notes (Addendum)
PCP - lov Evelina Dun , FNP 10-10-20 LOV Cardiologist -   PPM/ICD -  Device Orders -  Rep Notified -   Chest x-ray - 06-08-20 epic EKG - 10-10-20 epic Stress Test -  ECHO -  Cardiac Cath -   Sleep Study -  CPAP -   Fasting Blood Sugar -  Checks Blood Sugar _____ times a day  Blood Thinner Instructions: Aspirin Instructions:  ERAS Protcol - PRE-SURGERY Ensure or G2-   COVID TEST- 11-04-20  Activity--Able to complete ADL's without SOB limited due to knee pain Anesthesia review: HTN BP elevated at preop 165/111 Konrad Felix PA aware . Pt. Advised to avoid salt , drink water and check BP at home   Patient denies shortness of breath, fever, cough and chest pain at PAT appointment   All instructions explained to the patient, with a verbal understanding of the material. Patient agrees to go over the instructions while at home for a better understanding. Patient also instructed to self quarantine after being tested for COVID-19. The opportunity to ask questions was provided.

## 2020-11-04 ENCOUNTER — Other Ambulatory Visit (HOSPITAL_COMMUNITY)
Admission: RE | Admit: 2020-11-04 | Discharge: 2020-11-04 | Disposition: A | Discharge status: Home / Self Care | Payer: BC Managed Care – PPO | Source: Ambulatory Visit | Attending: Orthopedic Surgery | Admitting: Orthopedic Surgery

## 2020-11-04 ENCOUNTER — Encounter (HOSPITAL_COMMUNITY)
Admission: RE | Admit: 2020-11-04 | Discharge: 2020-11-04 | Disposition: A | Discharge status: Home / Self Care | Payer: BC Managed Care – PPO | Source: Ambulatory Visit | Attending: Orthopedic Surgery | Admitting: Orthopedic Surgery

## 2020-11-04 ENCOUNTER — Encounter (HOSPITAL_COMMUNITY): Payer: Self-pay

## 2020-11-04 ENCOUNTER — Other Ambulatory Visit: Payer: Self-pay

## 2020-11-04 DIAGNOSIS — Z20822 Contact with and (suspected) exposure to covid-19: Secondary | ICD-10-CM | POA: Insufficient documentation

## 2020-11-04 DIAGNOSIS — Z0182 Encounter for allergy testing: Secondary | ICD-10-CM | POA: Insufficient documentation

## 2020-11-04 DIAGNOSIS — Z01812 Encounter for preprocedural laboratory examination: Secondary | ICD-10-CM | POA: Diagnosis not present

## 2020-11-04 LAB — COMPREHENSIVE METABOLIC PANEL
ALT: 21 U/L (ref 0–44)
AST: 20 U/L (ref 15–41)
Albumin: 3.9 g/dL (ref 3.5–5.0)
Alkaline Phosphatase: 90 U/L (ref 38–126)
Anion gap: 10 (ref 5–15)
BUN: 15 mg/dL (ref 6–20)
CO2: 28 mmol/L (ref 22–32)
Calcium: 9.3 mg/dL (ref 8.9–10.3)
Chloride: 106 mmol/L (ref 98–111)
Creatinine, Ser: 0.8 mg/dL (ref 0.44–1.00)
GFR, Estimated: >60 mL/min (ref >60)
Glucose, Bld: 98 mg/dL (ref 70–99)
Potassium: 3.4 mmol/L — ABNORMAL LOW (ref 3.5–5.1)
Sodium: 144 mmol/L (ref 135–145)
Total Bilirubin: 0.5 mg/dL (ref 0.3–1.2)
Total Protein: 7.5 g/dL (ref 6.5–8.1)

## 2020-11-04 LAB — CBC
HCT: 40.6 % (ref 36.0–46.0)
Hemoglobin: 13.2 g/dL (ref 12.0–15.0)
MCH: 29.7 pg (ref 26.0–34.0)
MCHC: 32.5 g/dL (ref 30.0–36.0)
MCV: 91.2 fL (ref 80.0–100.0)
Platelets: 211 10*3/uL (ref 150–400)
RBC: 4.45 MIL/uL (ref 3.87–5.11)
RDW: 12.9 % (ref 11.5–15.5)
WBC: 6.1 10*3/uL (ref 4.0–10.5)
nRBC: 0 % (ref 0.0–0.2)

## 2020-11-04 LAB — APTT: aPTT: 34 seconds (ref 24–36)

## 2020-11-04 LAB — SURGICAL PCR SCREEN
MRSA, PCR: NEGATIVE
Staphylococcus aureus: NEGATIVE

## 2020-11-04 LAB — PROTIME-INR
INR: 1 (ref 0.8–1.2)
Prothrombin Time: 12.6 seconds (ref 11.4–15.2)

## 2020-11-05 LAB — SARS CORONAVIRUS 2 (TAT 6-24 HRS): SARS Coronavirus 2: NEGATIVE

## 2020-11-07 ENCOUNTER — Encounter (HOSPITAL_COMMUNITY): Payer: Self-pay | Admitting: Orthopedic Surgery
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
## 2020-11-08 ENCOUNTER — Inpatient Hospital Stay (HOSPITAL_COMMUNITY)
Admission: RE | Admit: 2020-11-08 | Discharge: 2020-11-09 | DRG: 467 | Disposition: A | Discharge status: Home / Self Care | Payer: BC Managed Care – PPO | Attending: Orthopedic Surgery | Admitting: Orthopedic Surgery

## 2020-11-08 ENCOUNTER — Ambulatory Visit (HOSPITAL_COMMUNITY): Payer: BC Managed Care – PPO | Admitting: Physician Assistant

## 2020-11-08 ENCOUNTER — Encounter (HOSPITAL_COMMUNITY)
Admission: RE | Disposition: A | Discharge status: Home / Self Care | Payer: Self-pay | Source: Home / Self Care | Attending: Orthopedic Surgery

## 2020-11-08 ENCOUNTER — Encounter (HOSPITAL_COMMUNITY): Payer: Self-pay | Admitting: Orthopedic Surgery

## 2020-11-08 ENCOUNTER — Ambulatory Visit (HOSPITAL_COMMUNITY): Payer: BC Managed Care – PPO | Admitting: Anesthesiology

## 2020-11-08 ENCOUNTER — Other Ambulatory Visit: Payer: Self-pay

## 2020-11-08 DIAGNOSIS — Z79899 Other long term (current) drug therapy: Secondary | ICD-10-CM | POA: Diagnosis not present

## 2020-11-08 DIAGNOSIS — E785 Hyperlipidemia, unspecified: Secondary | ICD-10-CM | POA: Diagnosis present

## 2020-11-08 DIAGNOSIS — I10 Essential (primary) hypertension: Secondary | ICD-10-CM | POA: Diagnosis not present

## 2020-11-08 DIAGNOSIS — M1711 Unilateral primary osteoarthritis, right knee: Secondary | ICD-10-CM | POA: Diagnosis not present

## 2020-11-08 DIAGNOSIS — T84032A Mechanical loosening of internal right knee prosthetic joint, initial encounter: Principal | ICD-10-CM | POA: Diagnosis present

## 2020-11-08 DIAGNOSIS — Z6841 Body Mass Index (BMI) 40.0 and over, adult: Secondary | ICD-10-CM | POA: Diagnosis not present

## 2020-11-08 DIAGNOSIS — Z801 Family history of malignant neoplasm of trachea, bronchus and lung: Secondary | ICD-10-CM | POA: Diagnosis not present

## 2020-11-08 DIAGNOSIS — Z87891 Personal history of nicotine dependence: Secondary | ICD-10-CM

## 2020-11-08 DIAGNOSIS — Y792 Prosthetic and other implants, materials and accessory orthopedic devices associated with adverse incidents: Secondary | ICD-10-CM | POA: Diagnosis present

## 2020-11-08 DIAGNOSIS — Z833 Family history of diabetes mellitus: Secondary | ICD-10-CM | POA: Diagnosis not present

## 2020-11-08 DIAGNOSIS — Z981 Arthrodesis status: Secondary | ICD-10-CM | POA: Diagnosis not present

## 2020-11-08 DIAGNOSIS — Z90710 Acquired absence of both cervix and uterus: Secondary | ICD-10-CM

## 2020-11-08 DIAGNOSIS — E559 Vitamin D deficiency, unspecified: Secondary | ICD-10-CM | POA: Diagnosis not present

## 2020-11-08 DIAGNOSIS — Z8262 Family history of osteoporosis: Secondary | ICD-10-CM

## 2020-11-08 DIAGNOSIS — Z96651 Presence of right artificial knee joint: Secondary | ICD-10-CM

## 2020-11-08 DIAGNOSIS — Z888 Allergy status to other drugs, medicaments and biological substances status: Secondary | ICD-10-CM

## 2020-11-08 DIAGNOSIS — Z823 Family history of stroke: Secondary | ICD-10-CM | POA: Diagnosis not present

## 2020-11-08 DIAGNOSIS — Z83438 Family history of other disorder of lipoprotein metabolism and other lipidemia: Secondary | ICD-10-CM

## 2020-11-08 DIAGNOSIS — Z8249 Family history of ischemic heart disease and other diseases of the circulatory system: Secondary | ICD-10-CM

## 2020-11-08 DIAGNOSIS — G8918 Other acute postprocedural pain: Secondary | ICD-10-CM | POA: Diagnosis not present

## 2020-11-08 HISTORY — PX: TOTAL KNEE REVISION: SHX996

## 2020-11-08 LAB — TYPE AND SCREEN
ABO/RH(D): O POS
Antibody Screen: NEGATIVE

## 2020-11-08 SURGERY — TOTAL KNEE REVISION
Anesthesia: Spinal | Site: Knee | Laterality: Right

## 2020-11-08 MED ORDER — FENTANYL CITRATE (PF) 100 MCG/2ML IJ SOLN
INTRAMUSCULAR | Status: AC
  Filled 2020-11-08: qty 2

## 2020-11-08 MED ORDER — TOBRAMYCIN SULFATE 1.2 G IJ SOLR
INTRAMUSCULAR | Status: AC
  Filled 2020-11-08: qty 1.2

## 2020-11-08 MED ORDER — SODIUM CHLORIDE (PF) 0.9 % IJ SOLN
INTRAMUSCULAR | Status: DC | PRN
  Administered 2020-11-08: 30 mL

## 2020-11-08 MED ORDER — VANCOMYCIN HCL 1000 MG IV SOLR
INTRAVENOUS | Status: AC
  Filled 2020-11-08: qty 1000

## 2020-11-08 MED ORDER — TOBRAMYCIN SULFATE 1.2 G IJ SOLR
INTRAMUSCULAR | Status: DC | PRN
  Administered 2020-11-08 (×2): 1.2 g

## 2020-11-08 MED ORDER — ORAL CARE MOUTH RINSE: 15.0000 mL | Freq: Once | OROMUCOSAL | Status: AC

## 2020-11-08 MED ORDER — HYDROCHLOROTHIAZIDE 25 MG PO TABS
25.0000 mg | ORAL_TABLET | Freq: Every day | ORAL | Status: DC
  Administered 2020-11-09: 25 mg via ORAL
  Filled 2020-11-08: qty 1

## 2020-11-08 MED ORDER — KETOROLAC TROMETHAMINE 30 MG/ML IJ SOLN
INTRAMUSCULAR | Status: DC | PRN
  Administered 2020-11-08: 30 mg

## 2020-11-08 MED ORDER — SODIUM CHLORIDE 0.9 % IV SOLN: INTRAVENOUS | Status: DC

## 2020-11-08 MED ORDER — HYDROCODONE-ACETAMINOPHEN 7.5-325 MG PO TABS
1.0000 | ORAL_TABLET | ORAL | Status: DC | PRN
  Administered 2020-11-08 – 2020-11-09 (×4): 2 via ORAL
  Filled 2020-11-08 (×4): qty 2

## 2020-11-08 MED ORDER — EPHEDRINE 5 MG/ML INJ
INTRAVENOUS | Status: AC
  Filled 2020-11-08: qty 10

## 2020-11-08 MED ORDER — BISACODYL 10 MG RE SUPP: 10.0000 mg | Freq: Every day | RECTAL | Status: DC | PRN

## 2020-11-08 MED ORDER — CEFAZOLIN SODIUM-DEXTROSE 2-4 GM/100ML-% IV SOLN
2.0000 g | Freq: Four times a day (QID) | INTRAVENOUS | Status: AC
  Administered 2020-11-08 (×2): 2 g via INTRAVENOUS
  Filled 2020-11-08 (×3): qty 100

## 2020-11-08 MED ORDER — TRANEXAMIC ACID-NACL 1000-0.7 MG/100ML-% IV SOLN
1000.0000 mg | Freq: Once | INTRAVENOUS | Status: AC
  Administered 2020-11-08: 1000 mg via INTRAVENOUS
  Filled 2020-11-08: qty 100

## 2020-11-08 MED ORDER — ONDANSETRON HCL 4 MG/2ML IJ SOLN
INTRAMUSCULAR | Status: AC
  Filled 2020-11-08: qty 4

## 2020-11-08 MED ORDER — ONDANSETRON HCL 4 MG/2ML IJ SOLN
INTRAMUSCULAR | Status: DC | PRN
  Administered 2020-11-08: 4 mg via INTRAVENOUS

## 2020-11-08 MED ORDER — DIPHENHYDRAMINE HCL 12.5 MG/5ML PO ELIX
12.5000 mg | ORAL_SOLUTION | ORAL | Status: DC | PRN
Start: 2020-11-08 — End: 2020-11-09

## 2020-11-08 MED ORDER — HYDROMORPHONE HCL 1 MG/ML IJ SOLN
INTRAMUSCULAR | Status: AC
  Filled 2020-11-08: qty 1

## 2020-11-08 MED ORDER — HYDROMORPHONE HCL 1 MG/ML IJ SOLN
0.2500 mg | INTRAMUSCULAR | Status: DC | PRN
  Administered 2020-11-08 (×2): 0.5 mg via INTRAVENOUS

## 2020-11-08 MED ORDER — FENTANYL CITRATE (PF) 100 MCG/2ML IJ SOLN
INTRAMUSCULAR | Status: DC | PRN
  Administered 2020-11-08 (×3): 50 ug via INTRAVENOUS
  Administered 2020-11-08 (×2): 25 ug via INTRAVENOUS
  Administered 2020-11-08: 50 ug via INTRAVENOUS
  Administered 2020-11-08 (×2): 25 ug via INTRAVENOUS

## 2020-11-08 MED ORDER — METHOCARBAMOL 500 MG IVPB - SIMPLE MED
INTRAVENOUS | Status: AC
  Administered 2020-11-08: 500 mg via INTRAVENOUS
  Filled 2020-11-08: qty 50

## 2020-11-08 MED ORDER — MORPHINE SULFATE (PF) 2 MG/ML IV SOLN: 0.5000 mg | INTRAVENOUS | Status: DC | PRN

## 2020-11-08 MED ORDER — DEXAMETHASONE SODIUM PHOSPHATE 10 MG/ML IJ SOLN
INTRAMUSCULAR | Status: AC
  Filled 2020-11-08: qty 2

## 2020-11-08 MED ORDER — GLYCOPYRROLATE PF 0.2 MG/ML IJ SOSY
PREFILLED_SYRINGE | INTRAMUSCULAR | Status: AC
  Filled 2020-11-08: qty 1

## 2020-11-08 MED ORDER — DEXMEDETOMIDINE (PRECEDEX) IN NS 20 MCG/5ML (4 MCG/ML) IV SYRINGE
PREFILLED_SYRINGE | INTRAVENOUS | Status: AC
  Filled 2020-11-08: qty 5

## 2020-11-08 MED ORDER — LOSARTAN POTASSIUM 50 MG PO TABS
100.0000 mg | ORAL_TABLET | Freq: Every day | ORAL | Status: DC
  Administered 2020-11-09: 100 mg via ORAL
  Filled 2020-11-08: qty 2

## 2020-11-08 MED ORDER — AMLODIPINE BESYLATE 10 MG PO TABS
10.0000 mg | ORAL_TABLET | Freq: Every day | ORAL | Status: DC
  Administered 2020-11-09: 10 mg via ORAL
  Filled 2020-11-08: qty 1

## 2020-11-08 MED ORDER — DEXAMETHASONE SODIUM PHOSPHATE 10 MG/ML IJ SOLN
INTRAMUSCULAR | Status: DC | PRN
  Administered 2020-11-08: 10 mg via INTRAVENOUS

## 2020-11-08 MED ORDER — CHLORHEXIDINE GLUCONATE 0.12 % MT SOLN
15.0000 mL | Freq: Once | OROMUCOSAL | Status: AC
  Administered 2020-11-08: 15 mL via OROMUCOSAL

## 2020-11-08 MED ORDER — LACTATED RINGERS IV SOLN: INTRAVENOUS | Status: DC

## 2020-11-08 MED ORDER — CELECOXIB 200 MG PO CAPS
200.0000 mg | ORAL_CAPSULE | Freq: Two times a day (BID) | ORAL | Status: DC
  Administered 2020-11-08 – 2020-11-09 (×2): 200 mg via ORAL
  Filled 2020-11-08 (×2): qty 1

## 2020-11-08 MED ORDER — TRANEXAMIC ACID-NACL 1000-0.7 MG/100ML-% IV SOLN
1000.0000 mg | INTRAVENOUS | Status: AC
  Administered 2020-11-08: 1000 mg via INTRAVENOUS
  Filled 2020-11-08: qty 100

## 2020-11-08 MED ORDER — LIDOCAINE 2% (20 MG/ML) 5 ML SYRINGE
INTRAMUSCULAR | Status: AC
  Filled 2020-11-08: qty 5

## 2020-11-08 MED ORDER — DEXMEDETOMIDINE (PRECEDEX) IN NS 20 MCG/5ML (4 MCG/ML) IV SYRINGE
PREFILLED_SYRINGE | INTRAVENOUS | Status: DC | PRN
  Administered 2020-11-08: 4 ug via INTRAVENOUS
  Administered 2020-11-08 (×2): 8 ug via INTRAVENOUS

## 2020-11-08 MED ORDER — PROPOFOL 10 MG/ML IV BOLUS
INTRAVENOUS | Status: DC | PRN
  Administered 2020-11-08: 10 mg via INTRAVENOUS
  Administered 2020-11-08: 190 mg via INTRAVENOUS

## 2020-11-08 MED ORDER — METOCLOPRAMIDE HCL 5 MG PO TABS
5.0000 mg | ORAL_TABLET | Freq: Three times a day (TID) | ORAL | Status: DC | PRN
Start: 2020-11-08 — End: 2020-11-09

## 2020-11-08 MED ORDER — STERILE WATER FOR IRRIGATION IR SOLN
Status: DC | PRN
  Administered 2020-11-08 (×2): 1000 mL

## 2020-11-08 MED ORDER — DEXAMETHASONE SODIUM PHOSPHATE 10 MG/ML IJ SOLN
10.0000 mg | Freq: Once | INTRAMUSCULAR | Status: AC
  Administered 2020-11-09: 10 mg via INTRAVENOUS
  Filled 2020-11-08: qty 1

## 2020-11-08 MED ORDER — ONDANSETRON HCL 4 MG PO TABS
4.0000 mg | ORAL_TABLET | Freq: Four times a day (QID) | ORAL | Status: DC | PRN
  Administered 2020-11-09: 4 mg via ORAL
  Filled 2020-11-08: qty 1

## 2020-11-08 MED ORDER — MIDAZOLAM HCL 2 MG/2ML IJ SOLN
INTRAMUSCULAR | Status: AC
  Filled 2020-11-08: qty 2

## 2020-11-08 MED ORDER — LOSARTAN POTASSIUM-HCTZ 100-25 MG PO TABS: 1.0000 | ORAL_TABLET | Freq: Every day | ORAL | Status: DC

## 2020-11-08 MED ORDER — PROPOFOL 10 MG/ML IV BOLUS
INTRAVENOUS | Status: AC
  Filled 2020-11-08: qty 40

## 2020-11-08 MED ORDER — HYDROCODONE-ACETAMINOPHEN 5-325 MG PO TABS
1.0000 | ORAL_TABLET | ORAL | Status: DC | PRN
  Administered 2020-11-09: 2 via ORAL
  Filled 2020-11-08: qty 2

## 2020-11-08 MED ORDER — URSODIOL 300 MG PO CAPS
300.0000 mg | ORAL_CAPSULE | Freq: Two times a day (BID) | ORAL | Status: DC
  Administered 2020-11-09: 300 mg via ORAL
  Filled 2020-11-08: qty 1

## 2020-11-08 MED ORDER — POLYETHYLENE GLYCOL 3350 17 G PO PACK: 17.0000 g | PACK | Freq: Every day | ORAL | Status: DC | PRN

## 2020-11-08 MED ORDER — BUPIVACAINE-EPINEPHRINE 0.25% -1:200000 IJ SOLN
INTRAMUSCULAR | Status: DC | PRN
  Administered 2020-11-08: 30 mL

## 2020-11-08 MED ORDER — KETOROLAC TROMETHAMINE 30 MG/ML IJ SOLN
INTRAMUSCULAR | Status: AC
  Filled 2020-11-08: qty 1

## 2020-11-08 MED ORDER — DEXAMETHASONE SODIUM PHOSPHATE 10 MG/ML IJ SOLN: 8.0000 mg | Freq: Once | INTRAMUSCULAR | Status: DC

## 2020-11-08 MED ORDER — ASPIRIN 81 MG PO CHEW
81.0000 mg | CHEWABLE_TABLET | Freq: Two times a day (BID) | ORAL | Status: DC
  Administered 2020-11-08 – 2020-11-09 (×2): 81 mg via ORAL
  Filled 2020-11-08 (×2): qty 1

## 2020-11-08 MED ORDER — BUPIVACAINE HCL (PF) 0.5 % IJ SOLN
INTRAMUSCULAR | Status: DC | PRN
  Administered 2020-11-08: 20 mL via PERINEURAL

## 2020-11-08 MED ORDER — EPHEDRINE SULFATE-NACL 50-0.9 MG/10ML-% IV SOSY
PREFILLED_SYRINGE | INTRAVENOUS | Status: DC | PRN
  Administered 2020-11-08: 5 mg via INTRAVENOUS
  Administered 2020-11-08: 10 mg via INTRAVENOUS
  Administered 2020-11-08: 5 mg via INTRAVENOUS
  Administered 2020-11-08: 10 mg via INTRAVENOUS
  Administered 2020-11-08 (×4): 5 mg via INTRAVENOUS

## 2020-11-08 MED ORDER — 0.9 % SODIUM CHLORIDE (POUR BTL) OPTIME
TOPICAL | Status: DC | PRN
  Administered 2020-11-08: 1000 mL

## 2020-11-08 MED ORDER — DOCUSATE SODIUM 100 MG PO CAPS
100.0000 mg | ORAL_CAPSULE | Freq: Two times a day (BID) | ORAL | Status: DC
  Administered 2020-11-08 – 2020-11-09 (×2): 100 mg via ORAL
  Filled 2020-11-08 (×2): qty 1

## 2020-11-08 MED ORDER — ONDANSETRON HCL 4 MG/2ML IJ SOLN: 4.0000 mg | Freq: Once | INTRAMUSCULAR | Status: DC | PRN

## 2020-11-08 MED ORDER — GLYCOPYRROLATE PF 0.2 MG/ML IJ SOSY
PREFILLED_SYRINGE | INTRAMUSCULAR | Status: DC | PRN
  Administered 2020-11-08: .2 mg via INTRAVENOUS

## 2020-11-08 MED ORDER — PHENOL 1.4 % MT LIQD: 1.0000 | OROMUCOSAL | Status: DC | PRN

## 2020-11-08 MED ORDER — SODIUM CHLORIDE (PF) 0.9 % IJ SOLN
INTRAMUSCULAR | Status: AC
  Filled 2020-11-08: qty 30

## 2020-11-08 MED ORDER — FERROUS SULFATE 325 (65 FE) MG PO TABS
325.0000 mg | ORAL_TABLET | Freq: Three times a day (TID) | ORAL | Status: DC
  Administered 2020-11-08: 325 mg via ORAL
  Filled 2020-11-08: qty 1

## 2020-11-08 MED ORDER — CYCLOSPORINE 0.05 % OP EMUL
1.0000 [drp] | Freq: Two times a day (BID) | OPHTHALMIC | Status: DC | PRN
  Filled 2020-11-08: qty 1

## 2020-11-08 MED ORDER — ONDANSETRON HCL 4 MG/2ML IJ SOLN
4.0000 mg | Freq: Four times a day (QID) | INTRAMUSCULAR | Status: DC | PRN
  Administered 2020-11-08 (×3): 4 mg via INTRAVENOUS
  Filled 2020-11-08 (×3): qty 2

## 2020-11-08 MED ORDER — METHOCARBAMOL 500 MG PO TABS
500.0000 mg | ORAL_TABLET | Freq: Four times a day (QID) | ORAL | Status: DC | PRN
  Administered 2020-11-08 – 2020-11-09 (×3): 500 mg via ORAL
  Filled 2020-11-08 (×3): qty 1

## 2020-11-08 MED ORDER — PHENYLEPHRINE HCL-NACL 10-0.9 MG/250ML-% IV SOLN
INTRAVENOUS | Status: AC
  Filled 2020-11-08: qty 500

## 2020-11-08 MED ORDER — CEFAZOLIN IN SODIUM CHLORIDE 3-0.9 GM/100ML-% IV SOLN
3.0000 g | INTRAVENOUS | Status: AC
  Administered 2020-11-08: 3 g via INTRAVENOUS
  Filled 2020-11-08: qty 100

## 2020-11-08 MED ORDER — ACETAMINOPHEN 325 MG PO TABS: 325.0000 mg | ORAL_TABLET | Freq: Four times a day (QID) | ORAL | Status: DC | PRN

## 2020-11-08 MED ORDER — MENTHOL 3 MG MT LOZG: 1.0000 | LOZENGE | OROMUCOSAL | Status: DC | PRN

## 2020-11-08 MED ORDER — METHOCARBAMOL 500 MG IVPB - SIMPLE MED
500.0000 mg | Freq: Four times a day (QID) | INTRAVENOUS | Status: DC | PRN
  Filled 2020-11-08: qty 50

## 2020-11-08 MED ORDER — MIDAZOLAM HCL 5 MG/5ML IJ SOLN
INTRAMUSCULAR | Status: DC | PRN
  Administered 2020-11-08 (×2): 1 mg via INTRAVENOUS

## 2020-11-08 MED ORDER — LIDOCAINE 2% (20 MG/ML) 5 ML SYRINGE
INTRAMUSCULAR | Status: DC | PRN
  Administered 2020-11-08: 40 mg via INTRAVENOUS
  Administered 2020-11-08: 60 mg via INTRAVENOUS

## 2020-11-08 MED ORDER — HYDROMORPHONE HCL 1 MG/ML IJ SOLN
INTRAMUSCULAR | Status: AC
  Administered 2020-11-08: 0.25 mg via INTRAVENOUS
  Filled 2020-11-08: qty 1

## 2020-11-08 MED ORDER — POVIDONE-IODINE 10 % EX SWAB
2.0000 "application " | Freq: Once | CUTANEOUS | Status: AC
  Administered 2020-11-08: 2 via TOPICAL

## 2020-11-08 MED ORDER — METOCLOPRAMIDE HCL 5 MG/ML IJ SOLN
5.0000 mg | Freq: Three times a day (TID) | INTRAMUSCULAR | Status: DC | PRN
  Administered 2020-11-09: 10 mg via INTRAVENOUS
  Filled 2020-11-08: qty 2

## 2020-11-08 SURGICAL SUPPLY — 68 items
ADH SKN CLS APL DERMABOND .7 (GAUZE/BANDAGES/DRESSINGS) ×1
ARTISURF 14M VE R 6-9 CD KNEE (Knees) ×2 IMPLANT
AUG TIB CD 5 HLF BLCK STRL RL (Joint) ×1 IMPLANT
BAG DECANTER FOR FLEXI CONT (MISCELLANEOUS) IMPLANT
BAG ZIPLOCK 12X15 (MISCELLANEOUS) IMPLANT
BLADE SAW SGTL 11.0X1.19X90.0M (BLADE) IMPLANT
BLADE SAW SGTL 13.0X1.19X90.0M (BLADE) ×2 IMPLANT
BLADE SAW SGTL 81X20 HD (BLADE) ×2 IMPLANT
BLADE SURG SZ10 CARB STEEL (BLADE) ×4 IMPLANT
BLOCK HALF TIB REV PS CD RL 5 (Joint) ×2 IMPLANT
BLOCK HALF TIB REV PS CD RM 5 (Joint) ×2 IMPLANT
BNDG ELASTIC 6X5.8 VLCR STR LF (GAUZE/BANDAGES/DRESSINGS) ×2 IMPLANT
BRUSH FEMORAL CANAL (MISCELLANEOUS) IMPLANT
CEMENT BONE R 1X40 (Cement) ×2 IMPLANT
CEMENT RESTRICTOR DEPUY SZ 3 (Cement) ×2 IMPLANT
COMP TIB PERS KNEE SZ D RT (Joint) ×2 IMPLANT
COMPONENT TIB PERS KN SZD RT (Joint) ×1 IMPLANT
COVER SURGICAL LIGHT HANDLE (MISCELLANEOUS) ×2 IMPLANT
COVER WAND RF STERILE (DRAPES) IMPLANT
CUFF TOURN SGL QUICK 34 (TOURNIQUET CUFF) ×2
CUFF TRNQT CYL 34X4.125X (TOURNIQUET CUFF) ×1 IMPLANT
DECANTER SPIKE VIAL GLASS SM (MISCELLANEOUS) IMPLANT
DERMABOND ADVANCED (GAUZE/BANDAGES/DRESSINGS) ×1
DERMABOND ADVANCED .7 DNX12 (GAUZE/BANDAGES/DRESSINGS) ×1 IMPLANT
DRAPE U-SHAPE 47X51 STRL (DRAPES) ×2 IMPLANT
DRSG AQUACEL AG ADV 3.5X14 (GAUZE/BANDAGES/DRESSINGS) ×2 IMPLANT
DURAPREP 26ML APPLICATOR (WOUND CARE) ×4 IMPLANT
ELECT REM PT RETURN 15FT ADLT (MISCELLANEOUS) ×2 IMPLANT
GAUZE SPONGE 2X2 8PLY STRL LF (GAUZE/BANDAGES/DRESSINGS) IMPLANT
GLOVE SURG ORTHO LTX SZ7.5 (GLOVE) ×2 IMPLANT
GLOVE SURG UNDER POLY LF SZ7.5 (GLOVE) ×2 IMPLANT
GOWN STRL REUS W/TWL LRG LVL3 (GOWN DISPOSABLE) ×2 IMPLANT
HANDPIECE INTERPULSE COAX TIP (DISPOSABLE) ×2
HDLS TROCR DRIL PIN KNEE 75 (PIN) ×2
HOLDER FOLEY CATH W/STRAP (MISCELLANEOUS) IMPLANT
INSERT TIB PS CMTLS CC XSM (Insert) ×2 IMPLANT
INSTR SCRW HEX REV FIX 3.5X48 (ORTHOPEDIC DISPOSABLE SUPPLIES) ×2
INSTRUMENT SCRW HEX REV 3.5X48 (ORTHOPEDIC DISPOSABLE SUPPLIES) ×1 IMPLANT
IRRIGATION SURGIPHOR STRL (IV SOLUTION) IMPLANT
KIT TURNOVER KIT A (KITS) ×2 IMPLANT
MANIFOLD NEPTUNE II (INSTRUMENTS) ×2 IMPLANT
NDL SAFETY ECLIPSE 18X1.5 (NEEDLE) IMPLANT
NEEDLE HYPO 18GX1.5 SHARP (NEEDLE)
NS IRRIG 1000ML POUR BTL (IV SOLUTION) ×2 IMPLANT
PACK TOTAL KNEE CUSTOM (KITS) ×2 IMPLANT
PENCIL SMOKE EVACUATOR (MISCELLANEOUS) ×2 IMPLANT
PERSONA REVISION HEX HEADED SCREW ×2 IMPLANT
PERSONA REVISION HEX HEADED SCREW 3.5MM HEX 48MM L ×2 IMPLANT
PIN DRILL HDLS TROCAR 75 4PK (PIN) ×1 IMPLANT
PROTECTOR NERVE ULNAR (MISCELLANEOUS) ×2 IMPLANT
SCREW HEX HEADED 3.5X27 DISP (Screw) ×2 IMPLANT
SET HNDPC FAN SPRY TIP SCT (DISPOSABLE) ×1 IMPLANT
SET PAD KNEE POSITIONER (MISCELLANEOUS) ×2 IMPLANT
SPONGE GAUZE 2X2 STER 10/PKG (GAUZE/BANDAGES/DRESSINGS)
STAPLER VISISTAT 35W (STAPLE) IMPLANT
STEM FEM EXT PERS 75X14 (Stem) ×2 IMPLANT
SUT MNCRL AB 3-0 PS2 18 (SUTURE) ×2 IMPLANT
SUT STRATAFIX PDS+ 0 24IN (SUTURE) ×4 IMPLANT
SUT VIC AB 1 CT1 36 (SUTURE) ×4 IMPLANT
SUT VIC AB 2-0 CT1 27 (SUTURE) ×6
SUT VIC AB 2-0 CT1 TAPERPNT 27 (SUTURE) ×3 IMPLANT
SYR 50ML LL SCALE MARK (SYRINGE) IMPLANT
TOWER CARTRIDGE SMART MIX (DISPOSABLE) ×2 IMPLANT
TRAY FOLEY MTR SLVR 16FR STAT (SET/KITS/TRAYS/PACK) ×2 IMPLANT
TUBE KAMVAC SUCTION (TUBING) IMPLANT
TUBE SUCTION HIGH CAP CLEAR NV (SUCTIONS) ×2 IMPLANT
WATER STERILE IRR 1000ML POUR (IV SOLUTION) ×2 IMPLANT
WRAP KNEE MAXI GEL POST OP (GAUZE/BANDAGES/DRESSINGS) ×2 IMPLANT

## 2020-11-08 NOTE — Transfer of Care (Signed)
Immediate Anesthesia Transfer of Care Note  Patient: Carol Vargas  Procedure(s) Performed: Procedure(s): RIGHT TOTAL KNEE REVISION (Right)  Patient Location: PACU  Anesthesia Type:General  Level of Consciousness:  sedated, patient cooperative and responds to stimulation  Airway & Oxygen Therapy:Patient Spontanous Breathing and Patient connected to face mask oxgen  Post-op Assessment:  Report given to PACU RN and Post -op Vital signs reviewed and stable  Post vital signs:  Reviewed and stable  Last Vitals:  Vitals:   11/08/20 0604  BP: 134/85  Pulse: 66  Resp: 18  Temp: 36.6 C  SpO2: 99%    Complications: No apparent anesthesia complications

## 2020-11-08 NOTE — Anesthesia Preprocedure Evaluation (Signed)
Anesthesia Evaluation  Patient identified by MRN, date of birth, ID band Patient awake    Reviewed: Allergy & Precautions, NPO status , Patient's Chart, lab work & pertinent test results  Airway Mallampati: II  TM Distance: >3 FB Neck ROM: Full    Dental no notable dental hx.    Pulmonary neg pulmonary ROS, former smoker,    Pulmonary exam normal breath sounds clear to auscultation       Cardiovascular hypertension, Normal cardiovascular exam Rhythm:Regular Rate:Normal     Neuro/Psych negative neurological ROS  negative psych ROS   GI/Hepatic negative GI ROS, Neg liver ROS,   Endo/Other  Morbid obesity  Renal/GU negative Renal ROS  negative genitourinary   Musculoskeletal  (+) Arthritis , Osteoarthritis,    Abdominal   Peds negative pediatric ROS (+)  Hematology negative hematology ROS (+)   Anesthesia Other Findings   Reproductive/Obstetrics negative OB ROS                             Anesthesia Physical Anesthesia Plan  ASA: II  Anesthesia Plan: Spinal   Post-op Pain Management:  Regional for Post-op pain   Induction: Intravenous  PONV Risk Score and Plan: 3 and Ondansetron, Dexamethasone, Propofol infusion, Midazolam and Treatment may vary due to age or medical condition  Airway Management Planned: Simple Face Mask  Additional Equipment:   Intra-op Plan:   Post-operative Plan:   Informed Consent: I have reviewed the patients History and Physical, chart, labs and discussed the procedure including the risks, benefits and alternatives for the proposed anesthesia with the patient or authorized representative who has indicated his/her understanding and acceptance.     Dental advisory given  Plan Discussed with: CRNA and Surgeon  Anesthesia Plan Comments:         Anesthesia Quick Evaluation

## 2020-11-08 NOTE — Anesthesia Procedure Notes (Signed)
Procedure Name: LMA Insertion Date/Time: 11/08/2020 8:06 AM Performed by: Lavina Hamman, CRNA Pre-anesthesia Checklist: Patient identified, Emergency Drugs available, Suction available and Patient being monitored Patient Re-evaluated:Patient Re-evaluated prior to induction Oxygen Delivery Method: Circle System Utilized Preoxygenation: Pre-oxygenation with 100% oxygen Induction Type: IV induction Ventilation: Mask ventilation without difficulty LMA: LMA inserted LMA Size: 4.0 Number of attempts: 1 Airway Equipment and Method: Bite block Placement Confirmation: positive ETCO2 Tube secured with: Tape Dental Injury: Teeth and Oropharynx as per pre-operative assessment

## 2020-11-08 NOTE — Evaluation (Signed)
Physical Therapy Evaluation Patient Details Name: Carol Vargas MRN: [id number] DOB: 01-31-1976 Today's Date: 11/08/2020   History of Present Illness  Patient is 45 y.o. female s/p Rt TKR on 11/08/20 with PMH significant for original Rt TKA in 2016, sjogrens syndrome, HTN, back pain, OA.    Clinical Impression  Barbarajean Kinzler is a 45 y.o. female POD 0 s/p Rt TKR. Patient reports independence with mobility at baseline. Patient is now limited by functional impairments (see PT problem list below) and requires min guard/assist for transfers and gait with RW. Patient was able to ambulate ~10 feet with RW and min assist and was limited secondary to nausea. Patient instructed in exercise to facilitate ROM and circulation to manage edema. Patient will benefit from continued skilled PT interventions to address impairments and progress towards PLOF. Acute PT will follow to progress mobility and stair training in preparation for safe discharge home.     Follow Up Recommendations Follow surgeon's recommendation for DC plan and follow-up therapies;Outpatient PT    Equipment Recommendations  None recommended by PT    Recommendations for Other Services       Precautions / Restrictions Precautions Precautions: Fall Restrictions Weight Bearing Restrictions: No Other Position/Activity Restrictions: WBAT      Mobility  Bed Mobility Overal bed mobility: Needs Assistance Bed Mobility: Supine to Sit     Supine to sit: Min guard;HOB elevated     General bed mobility comments: pt taking extra time and cues needed to use bed rail and bring LE's off EOB fully.    Transfers Overall transfer level: Needs assistance Equipment used: Rolling walker (2 wheeled) Transfers: Sit to/from Stand Sit to Stand: Min guard         General transfer comment: cues for safe technique/hand placement on RW, no assist needed to initiate rise, guarding for safety.  Ambulation/Gait Ambulation/Gait assistance: Min  assist Gait Distance (Feet): 10 Feet Assistive device: Rolling walker (2 wheeled) Gait Pattern/deviations: Step-to pattern;Decreased stride length;Decreased weight shift to right Gait velocity: decr   General Gait Details: min cues for step pattern and proximity to RW. no buckling at Rt knee and pt with good use of UE's on walker. Assist for position of walker with turns. distance limited by pt c/o nausea.  Stairs            Wheelchair Mobility    Modified Rankin (Stroke Patients Only)       Balance Overall balance assessment: Needs assistance Sitting-balance support: Feet supported Sitting balance-Leahy Scale: Good     Standing balance support: During functional activity;Bilateral upper extremity supported Standing balance-Leahy Scale: Fair                               Pertinent Vitals/Pain Pain Assessment: 0-10 Pain Score: 3  Pain Location: Rt knee Pain Descriptors / Indicators: Aching Pain Intervention(s): Limited activity within patient's tolerance;Repositioned;Monitored during session;Premedicated before session    Home Living Family/patient expects to be discharged to:: Private residence Living Arrangements: Spouse/significant other;Children;Parent Available Help at Discharge: Family;Available 24 hours/day Type of Home: House Home Access: Stairs to enter Entrance Stairs-Rails: None Entrance Stairs-Number of Steps: 1 up onto porch Home Layout: One level Home Equipment: Environmental consultant - 2 wheels      Prior Function Level of Independence: Independent         Comments: currently working     Hand Dominance   Dominant Hand: Right    Extremity/Trunk Assessment  Upper Extremity Assessment Upper Extremity Assessment: Overall WFL for tasks assessed    Lower Extremity Assessment Lower Extremity Assessment: RLE deficits/detail RLE Deficits / Details: good quad activation, no extensor lag with SLR but height limited due to pain RLE Sensation:  WNL RLE Coordination: WNL    Cervical / Trunk Assessment Cervical / Trunk Assessment: Normal  Communication   Communication: No difficulties  Cognition Arousal/Alertness: Awake/alert Behavior During Therapy: WFL for tasks assessed/performed Overall Cognitive Status: Within Functional Limits for tasks assessed                                        General Comments      Exercises Total Joint Exercises Ankle Circles/Pumps: AROM;Both;20 reps;Seated   Assessment/Plan    PT Assessment Patient needs continued PT services  PT Problem List Decreased strength;Decreased range of motion;Decreased activity tolerance;Decreased mobility;Decreased balance;Decreased knowledge of use of DME;Decreased safety awareness;Decreased knowledge of precautions       PT Treatment Interventions DME instruction;Gait training;Stair training;Functional mobility training;Therapeutic activities;Therapeutic exercise;Balance training;Patient/family education    PT Goals (Current goals can be found in the Care Plan section)  Acute Rehab PT Goals Patient Stated Goal: get back to moving with knee not hurting PT Goal Formulation: With patient Time For Goal Achievement: 11/15/20 Potential to Achieve Goals: Good    Frequency 7X/week   Barriers to discharge        Co-evaluation               AM-PAC PT "6 Clicks" Mobility  Outcome Measure Help needed turning from your back to your side while in a flat bed without using bedrails?: None Help needed moving from lying on your back to sitting on the side of a flat bed without using bedrails?: A Little Help needed moving to and from a bed to a chair (including a wheelchair)?: A Little Help needed standing up from a chair using your arms (e.g., wheelchair or bedside chair)?: A Little Help needed to walk in hospital room?: A Little Help needed climbing 3-5 steps with a railing? : A Little 6 Click Score: 19    End of Session Equipment  Utilized During Treatment: Gait belt Activity Tolerance: Other (comment);Patient tolerated treatment well (limited by nausea) Patient left: in chair;with call bell/phone within reach;with chair alarm set;with nursing/sitter in room Nurse Communication: Mobility status PT Visit Diagnosis: Muscle weakness (generalized) (M62.81);Difficulty in walking, not elsewhere classified (R26.2)    Time: 2992-4268 PT Time Calculation (min) (ACUTE ONLY): 20 min   Charges:   PT Evaluation $PT Eval Low Complexity: 1 Low          Verner Mould, DPT Acute Rehabilitation Services Office [phone] Pager [phone]    Jacques Navy 11/08/2020, 4:01 PM

## 2020-11-08 NOTE — Interval H&P Note (Signed)
History and Physical Interval Note:  11/08/2020 6:43 AM  Carol Vargas  has presented today for surgery, with the diagnosis of Failed right total knee arthroplasty.  The various methods of treatment have been discussed with the patient and family. After consideration of risks, benefits and other options for treatment, the patient has consented to  Procedure(s) with comments: TOTAL KNEE REVISION (Right) - 90 mins as a surgical intervention.  The patient's history has been reviewed, patient examined, no change in status, stable for surgery.  I have reviewed the patient's chart and labs.  Questions were answered to the patient's satisfaction.     Mauri Pole

## 2020-11-08 NOTE — Discharge Instructions (Signed)

## 2020-11-08 NOTE — Anesthesia Postprocedure Evaluation (Signed)
Anesthesia Post Note  Patient: Carol Vargas  Procedure(s) Performed: RIGHT TOTAL KNEE REVISION (Right Knee)     Patient location during evaluation: PACU Anesthesia Type: General Level of consciousness: awake and alert Pain management: pain level controlled Vital Signs Assessment: post-procedure vital signs reviewed and stable Respiratory status: spontaneous breathing, nonlabored ventilation, respiratory function stable and patient connected to nasal cannula oxygen Cardiovascular status: blood pressure returned to baseline and stable Postop Assessment: no apparent nausea or vomiting Anesthetic complications: no   No complications documented.  Last Vitals:  Vitals:   11/08/20 1045 11/08/20 1050  BP: 116/77   Pulse: 86 90  Resp: 15 12  Temp:    SpO2: 100% 100%    Last Pain:  Vitals:   11/08/20 1050  TempSrc:   PainSc: 5                  Nahlia Hellmann S

## 2020-11-08 NOTE — Anesthesia Procedure Notes (Signed)
Anesthesia Regional Block: Adductor canal block   Pre-Anesthetic Checklist: ,, timeout performed, Correct Patient, Correct Site, Correct Laterality, Correct Procedure, Correct Position, site marked, Risks and benefits discussed,  Surgical consent,  Pre-op evaluation,  At surgeon's request and post-op pain management  Laterality: Right  Prep: chloraprep       Needles:  Injection technique: Single-shot  Needle Type: Echogenic Needle     Needle Length: 9cm      Additional Needles:   Procedures:,,,, ultrasound used (permanent image in chart),,,,  Narrative:  Start time: 11/08/2020 6:55 AM End time: 11/08/2020 7:00 AM Injection made incrementally with aspirations every 5 mL.  Performed by: Personally  Anesthesiologist: Myrtie Soman, MD  Additional Notes: Patient tolerated the procedure well without complications

## 2020-11-08 NOTE — Plan of Care (Signed)
  Problem: Clinical Measurements: Goal: Cardiovascular complication will be avoided Outcome: Progressing   Problem: Clinical Measurements: Goal: Respiratory complications will improve Outcome: Progressing   Problem: Coping: Goal: Level of anxiety will decrease Outcome: Progressing   Problem: Elimination: Goal: Will not experience complications related to bowel motility Outcome: Progressing   Problem: Elimination: Goal: Will not experience complications related to urinary retention Outcome: Progressing   Problem: Skin Integrity: Goal: Risk for impaired skin integrity will decrease Outcome: Progressing

## 2020-11-08 NOTE — Brief Op Note (Signed)
11/08/2020  7:01 AM  PATIENT:  Carol Vargas  45 y.o. female  PRE-OPERATIVE DIAGNOSIS:  Failed right total knee arthroplasty, aseptic loosening of the tibial component  POST-OPERATIVE DIAGNOSIS:  Failed right total knee arthroplasty, aseptic loosening of the tibial component  PROCEDURE:  Procedure(s) with comments: TOTAL KNEE REVISION (Right) - 90 mins  SURGEON:  Surgeon(s) and Role:    Paralee Cancel, MD - Primary  PHYSICIAN ASSISTANT: Griffith Citron, PA-C  ANESTHESIA:   regional and spinal  EBL:  <100 cc  BLOOD ADMINISTERED:none  DRAINS: none   LOCAL MEDICATIONS USED:  MARCAINE     SPECIMEN:  No Specimen  DISPOSITION OF SPECIMEN:  N/A  COUNTS:  YES  TOURNIQUET:  69 min at 250 mmHg  DICTATION: .Other Dictation: Dictation Number [id number]  PLAN OF CARE: Admit to inpatient   PATIENT DISPOSITION:  PACU - hemodynamically stable.   Delay start of Pharmacological VTE agent (>24hrs) due to surgical blood loss or risk of bleeding: no

## 2020-11-08 NOTE — Anesthesia Procedure Notes (Signed)
Anesthesia Procedure Image    

## 2020-11-09 LAB — BASIC METABOLIC PANEL
Anion gap: 10 (ref 5–15)
BUN: 17 mg/dL (ref 6–20)
CO2: 26 mmol/L (ref 22–32)
Calcium: 9.1 mg/dL (ref 8.9–10.3)
Chloride: 101 mmol/L (ref 98–111)
Creatinine, Ser: 0.84 mg/dL (ref 0.44–1.00)
GFR, Estimated: >60 mL/min (ref >60)
Glucose, Bld: 114 mg/dL — ABNORMAL HIGH (ref 70–99)
Potassium: 3.6 mmol/L (ref 3.5–5.1)
Sodium: 137 mmol/L (ref 135–145)

## 2020-11-09 LAB — CBC
HCT: 39.9 % (ref 36.0–46.0)
Hemoglobin: 13 g/dL (ref 12.0–15.0)
MCH: 30.2 pg (ref 26.0–34.0)
MCHC: 32.6 g/dL (ref 30.0–36.0)
MCV: 92.6 fL (ref 80.0–100.0)
Platelets: 212 10*3/uL (ref 150–400)
RBC: 4.31 MIL/uL (ref 3.87–5.11)
RDW: 13.2 % (ref 11.5–15.5)
WBC: 12.9 10*3/uL — ABNORMAL HIGH (ref 4.0–10.5)
nRBC: 0 % (ref 0.0–0.2)

## 2020-11-09 MED ORDER — DOCUSATE SODIUM 100 MG PO CAPS: 100.0000 mg | ORAL_CAPSULE | Freq: Two times a day (BID) | ORAL | 0 refills | Status: DC

## 2020-11-09 MED ORDER — CELECOXIB 200 MG PO CAPS: 200.0000 mg | ORAL_CAPSULE | Freq: Two times a day (BID) | ORAL | 0 refills | Status: DC

## 2020-11-09 MED ORDER — HYDROCODONE-ACETAMINOPHEN 7.5-325 MG PO TABS: 1.0000 | ORAL_TABLET | ORAL | 0 refills | Status: DC | PRN

## 2020-11-09 MED ORDER — POLYETHYLENE GLYCOL 3350 17 G PO PACK
17.0000 g | PACK | Freq: Every day | ORAL | 0 refills | Status: DC | PRN
Start: 2020-11-09 — End: 2021-03-27

## 2020-11-09 MED ORDER — ASPIRIN 81 MG PO CHEW: 81.0000 mg | CHEWABLE_TABLET | Freq: Two times a day (BID) | ORAL | 0 refills | Status: AC

## 2020-11-09 MED ORDER — METHOCARBAMOL 500 MG PO TABS: 500.0000 mg | ORAL_TABLET | Freq: Four times a day (QID) | ORAL | 0 refills | Status: DC | PRN

## 2020-11-09 NOTE — TOC Transition Note (Signed)
Transition of Care [facility]) - CM/SW Discharge Note   Patient Details  Name: Carol Vargas MRN: [id number] Date of Birth: Sep 22, 1975  Transition of Care [facility]) CM/SW Contact:  Lennart Pall, LCSW Phone Number: 11/09/2020, 9:33 AM   Clinical Narrative:    Met with pt and confirming need for 3n1 commode - ordered per [employer].  Plan for OPPT at [facility] PT in [place].  No further TOC needs.   Final next level of care: OP Rehab Barriers to Discharge: No Barriers Identified   Patient Goals and CMS Choice Patient states their goals for this hospitalization and ongoing recovery are:: return home      Discharge Placement                       Discharge Plan and Services                DME Arranged: 3-N-1 DME Agency: [facility] Date DME Agency Contacted: 11/09/20 Time DME Agency Contacted: [phone] Representative spoke with at DME Agency: [place] ([place]) Interventions     Readmission Risk Interventions Readmission Risk Prevention Plan 11/09/2020  Post Dischage Appt Complete  Medication Screening Complete  Transportation Screening Complete  Some recent data might be hidden

## 2020-11-09 NOTE — Progress Notes (Signed)
Pt stated she will need a three in one for home at discharge. Pt already has rolling walker.

## 2020-11-09 NOTE — Progress Notes (Signed)
Subjective: 1 Day Post-Op Procedure(s) (LRB): RIGHT TOTAL KNEE REVISION (Right) Patient reports pain as mild.   Patient seen in rounds for Dr. Alvan Dame. Patient is well, and has had no acute complaints or problems. No acute events overnight. Voiding without difficulty. Patient ambulated 10 feet with PT. Patient was up moving in her room with her walker when I came in this AM. She reports she is already noticing improvement from prior to surgery. She did have some nausea yesterday and requests we send her with Zofran.  We will continue therapy today.   Objective: Vital signs in last 24 hours: Temp:  [97.5 F (36.4 C)-98.2 F (36.8 C)] 97.8 F (36.6 C) (05/04 0627) Pulse Rate:  [62-96] 63 (05/04 0627) Resp:  [10-17] 16 (05/04 0627) BP: (100-135)/(64-81) 116/68 (05/04 0627) SpO2:  [96 %-100 %] 98 % (05/04 0627)  Intake/Output from previous day:  Intake/Output Summary (Last 24 hours) at 11/09/2020 0801 Last data filed at 11/09/2020 9470 Gross per 24 hour  Intake 3630.83 ml  Output 1500 ml  Net 2130.83 ml     Intake/Output this shift: No intake/output data recorded.  Labs: Recent Labs    11/09/20 0302  HGB 13.0   Recent Labs    11/09/20 0302  WBC 12.9*  RBC 4.31  HCT 39.9  PLT 212   Recent Labs    11/09/20 0302  NA 137  K 3.6  CL 101  CO2 26  BUN 17  CREATININE 0.84  GLUCOSE 114*  CALCIUM 9.1   No results for input(s): LABPT, INR in the last 72 hours.  Exam: General - Patient is Alert and Oriented Extremity - Neurologically intact Sensation intact distally Intact pulses distally Dorsiflexion/Plantar flexion intact Dressing - dressing C/D/I Motor Function - intact, moving foot and toes well on exam.   Past Medical History:  Diagnosis Date  . Arthritis   . Back pain   . Bilateral swelling of feet   . Fibroids   . Gallstone 2021  . Headache(784.0)    at times, nothing severe per pt.menstral  . High cholesterol   . Hypertension   . Joint pain   .  Sjogrens syndrome ([place])   . Vitamin D deficiency     Assessment/Plan: 1 Day Post-Op Procedure(s) (LRB): RIGHT TOTAL KNEE REVISION (Right) Active Problems:   S/P revision of total knee, right  Estimated body mass index is 40.89 kg/m as calculated from the following:   Height as of this encounter: 5' 7.5" (1.715 m).   Weight as of this encounter: 120.2 kg. Advance diet Up with therapy D/C IV fluids   Patient's anticipated LOS is less than 2 midnights, meeting these requirements: - Younger than 64 - Lives within 1 hour of care - Has a competent adult at home to recover with post-op recover - NO history of  - Chronic pain requiring opiods  - Diabetes  - Coronary Artery Disease  - Heart failure  - Heart attack  - Stroke  - DVT/VTE  - Cardiac arrhythmia  - Respiratory Failure/COPD  - Renal failure  - Anemia  - Advanced Liver disease  DVT Prophylaxis - Aspirin Weight bearing as tolerated.  Hemoglobin stable at 13 this AM.   Plan is to go Home after hospital stay. Plan for discharge today following 1-2 sessions of PT as long as they are meeting their goals. Patient is scheduled for OPPT. Follow up in the office in 2 weeks.   Griffith Citron, PA-C Orthopedic Surgery [phone] 11/09/2020,  8:01 AM

## 2020-11-09 NOTE — Progress Notes (Signed)
Physical Therapy Treatment Patient Details Name: Carol Vargas MRN: [id number] DOB: 18-Jun-1976 Today's Date: 11/09/2020    History of Present Illness Patient is 45 y.o. female s/p Rt TKR on 11/08/20 with PMH significant for original Rt TKA in 2016, sjogrens syndrome, HTN, back pain, OA.    PT Comments    Pt ambulated in hallway and performed LE exercises.  Pt provided with HEP handout and had no further questions.  Pt feels ready for d/c home today.   Follow Up Recommendations  Follow surgeon's recommendation for DC plan and follow-up therapies;Outpatient PT     Equipment Recommendations  None recommended by PT    Recommendations for Other Services       Precautions / Restrictions Precautions Precautions: Fall;Knee Restrictions Other Position/Activity Restrictions: WBAT    Mobility  Bed Mobility Overal bed mobility: Needs Assistance Bed Mobility: Supine to Sit     Supine to sit: Supervision;HOB elevated          Transfers Overall transfer level: Needs assistance Equipment used: Rolling walker (2 wheeled) Transfers: Sit to/from Stand Sit to Stand: Supervision         General transfer comment: cues for safe technique/hand placement on RW  Ambulation/Gait Ambulation/Gait assistance: Supervision;Min guard Gait Distance (Feet): 200 Feet Assistive device: Rolling walker (2 wheeled) Gait Pattern/deviations: Step-to pattern;Decreased weight shift to right;Antalgic     General Gait Details: verbal cues for sequence, RW positioning, step length   Stairs             Wheelchair Mobility    Modified Rankin (Stroke Patients Only)       Balance                                            Cognition Arousal/Alertness: Awake/alert Behavior During Therapy: WFL for tasks assessed/performed Overall Cognitive Status: Within Functional Limits for tasks assessed                                        Exercises Total Joint  Exercises Ankle Circles/Pumps: AROM;Both;10 reps Quad Sets: AROM;Right;10 reps Short Arc QuadSinclair Ship;Right;10 reps Hip ABduction/ADduction: AAROM;Right;10 reps Straight Leg Raises: AAROM;Right;10 reps Knee Flexion: AAROM;Right;10 reps;Seated    General Comments        Pertinent Vitals/Pain Pain Assessment: 0-10 Pain Score: 4  Pain Location: Rt knee Pain Descriptors / Indicators: Aching;Sore Pain Intervention(s): Repositioned;Monitored during session    Home Living                      Prior Function            PT Goals (current goals can now be found in the care plan section) Progress towards PT goals: Progressing toward goals    Frequency    7X/week      PT Plan Current plan remains appropriate    Co-evaluation              AM-PAC PT "6 Clicks" Mobility   Outcome Measure  Help needed turning from your back to your side while in a flat bed without using bedrails?: None Help needed moving from lying on your back to sitting on the side of a flat bed without using bedrails?: A Little Help needed moving to and from a bed to a  chair (including a wheelchair)?: A Little Help needed standing up from a chair using your arms (e.g., wheelchair or bedside chair)?: A Little Help needed to walk in hospital room?: A Little Help needed climbing 3-5 steps with a railing? : A Little 6 Click Score: 19    End of Session Equipment Utilized During Treatment: Gait belt Activity Tolerance: Patient tolerated treatment well Patient left: in chair;with call bell/phone within reach;with chair alarm set   PT Visit Diagnosis: Muscle weakness (generalized) (M62.81);Difficulty in walking, not elsewhere classified (R26.2)     Time: 0211-1552 PT Time Calculation (min) (ACUTE ONLY): 16 min  Charges:  $Therapeutic Exercise: 8-22 mins           Jannette Spanner PT, DPT Acute Rehabilitation Services Pager: [phone] Office: [place] 11/09/2020, 1:57  PM

## 2020-11-09 NOTE — Op Note (Signed)
NAMEUNIKA, NAZARENO MEDICAL RECORD NO: [id number] ACCOUNT NO: [id number] DATE OF BIRTH: Nov 19, 1975 FACILITY: [facility] LOCATION: [id number] PHYSICIAN: Pietro Cassis. 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  PROCEDURE:  Revision right total knee arthroplasty.  COMPONENTS USED:  Zimmer Persona revision knee system with a size extra small trabecular metal tibial cone, size right D tibial tray with 5 mm medial and lateral augments with a 75 mm x 14 mm cemented stem and a 14 mm posterior stabilized insert to match  the size 8 femur for the size D baseplate.  SURGEON:  Pietro Cassis. Alvan Dame, MD  ASSISTANT:  Griffith Citron, PA-C.  Note that Ms. Nehemiah Settle was present for the entirety of the case from preoperative positioning, perioperative management of the operative extremity, general facilitation of the case and primary wound closure.  ANESTHESIA:  Regional plus general LMA.  SPECIMENS:  None.  COMPLICATIONS:  None.  TOURNIQUET TIME:  Tourniquet was utilized for 69 minutes at 250 mmHg.  INDICATION FOR PROCEDURE:  The patient is a very pleasant 45 year old female with history of right total knee arthroplasty that was performed for posttraumatic arthritis of the right knee following tibial plateau fracture requiring open reduction  internal fixation and subsequent hardware removal.  She presented to the office for evaluation of right knee pain.  Workup including a bone scan revealed concern for increased uptake along the proximal medial tibia  consistent with loosening.  This  correlated with her source of pain.  Based on these findings and the effect on her quality of life, she wished to proceed with revision knee surgery.  We reviewed the risk of infection, DVT, component failure, need for future surgery.  We discussed and  reviewed the postoperative course and expectations.  Consent was obtained for benefit of pain relief.  PROCEDURE IN DETAIL:  The patient was brought to the operative theater.  Once adequate anesthesia, preoperative antibiotics, Ancef administered as well as tranexamic acid and Decadron, she was positioned supine with a right thigh tourniquet placed.  The  right lower extremity was prepped and draped in sterile fashion.  Timeout was performed identifying the patient, planned procedure, and extremity.  Right lower extremity was exsanguinated, tourniquet elevated to 250 mmHg.  Her old incision was identified  and noted to have widened and it is healing.  We excised the entire extent of her incision, removing subcutaneous fat to allow for closure.  Soft tissue planes were created.  A median arthrotomy was then made, encountering clear synovial fluid.  The  first part of the case was directed at exposure and synovectomy in the medial and lateral aspect of the joint as well as suprapatellar.  We then everted the patella to allow for debridement around the patella to aid in exposure.  The knee was then flexed  and ligaments protected with retractors.  I worked myself around the proximal medial tibia enough to allow for the tibia to be subluxated for an isolated tibial revision to avoid any issue with the femoral component.  Once we had the tibia exposed, I  used a thin oscillating  saw and then osteotomes and we were able to remove the tibial tray without difficulty.  All remaining cement was removed.  Once this was done, we placed a central reamer into the tibia and prepared the proximal tibia for a  press-fit cone.  We reamed and  broached for the appropriate extra small trabecular metal cone.  We then prepared the tibia for a 75 mm stem and then did a tibial tray trial with a size right D tibial tray with a 75 sleeve and 5 mm augments due to the  fact that we removed a 16 mm insert.  We placed this tray and did a trial reduction and found that a 14 mm insert best balanced her ligaments in extension and in flexion.  Given all these findings, we removed the trial components.  We placed a cement  restrictor distally.  The final components were opened and configured on the back table.  We irrigated the knee with normal saline solution.  We injected the synovial capsular junction of the knee with 0.25% Marcaine with epinephrine and 1 mL of Toradol  and saline.  The trabecular metal cone was then impacted to the level of the cut tibia.  Cement was then mixed.  Two batches of cement were mixed with 1 gram of vancomycin.  The final component was then cemented into the proximal tibia with excessive  cement removed.  A 14 mm insert was placed and the knee was brought into extension to allow the cement to fully cure.  Once the cement had fully cured, we reassessed motion, and patella tracking.  Once I was satisfied with the stability of the knee, we  did select a 14 mm posterior stabilized insert.  It was snapped into the tibial tray and the knee reduced.  The knee was re-irrigated.  We did sprinkle vancomycin powder into the deep wound and then reapproximated the extensor mechanism using a  combination of #1 Vicryl and #1 Stratafix sutures.  The remaining vancomycin powder was sprinkled into the superficial layer.  The remaining wound was closed with 2-0 Vicryl and a running Monocryl stitch.  The knee was cleaned, dried and dressed  sterilely using surgical glue and Aquacel dressing.  She was then brought to the recovery room in stable condition, tolerating the procedure well.  Findings were reviewed with her husband.  POSTOPERATIVE PLAN:   She will be admitted to the hospital with hopeful discharge depending on pain control in the next 1-2 days.   NIK D: 11/08/2020 10:28:13 am T: 11/09/2020 2:05:00 am  JOB: [id number]/ [id number]

## 2020-11-11 DIAGNOSIS — T8450XD Infection and inflammatory reaction due to unspecified internal joint prosthesis, subsequent encounter: Secondary | ICD-10-CM | POA: Diagnosis not present

## 2020-11-11 DIAGNOSIS — M25561 Pain in right knee: Secondary | ICD-10-CM | POA: Diagnosis not present

## 2020-11-16 ENCOUNTER — Encounter (HOSPITAL_COMMUNITY): Payer: Self-pay | Admitting: Orthopedic Surgery

## 2020-11-17 NOTE — Discharge Summary (Signed)
Physician Discharge Summary   Patient ID: Enisa Runyan MRN: [id number] DOB/AGE: 03/10/1976 45 y.o.  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  Admission Diagnoses:  Past Medical History:  Diagnosis Date  . Arthritis   . Back pain   . Bilateral swelling of feet   . Fibroids   . Gallstone 2021  . Headache(784.0)    at times, nothing severe per pt.menstral  . High cholesterol   . Hypertension   . Joint pain   . Sjogrens syndrome ([place])   . Vitamin D deficiency    Discharge Diagnoses:   Active Problems:   S/P revision of total knee, right  Estimated body mass index is 40.89 kg/m as calculated from the following:   Height as of this encounter: 5' 7.5" (1.715 m).   Weight as of this encounter: 120.2 kg.  Procedure:  Procedure(s) (LRB): RIGHT TOTAL KNEE REVISION (Right)   Consults: None  HPI: The patient is a very pleasant 45 year old female with history of right total knee arthroplasty that was performed for posttraumatic arthritis of the right knee following tibial plateau fracture requiring open reduction  internal fixation and subsequent hardware removal.  She presented to the office for evaluation of right knee pain.  Workup including a bone scan revealed concern for increased uptake along the proximal medial tibia consistent with loosening.  This  correlated with her source of pain.  Based on these findings and the effect on her quality of life, she wished to proceed with revision knee surgery.  We reviewed the risk of infection, DVT, component failure, need for future surgery.  We discussed and  reviewed the postoperative course and expectations.  Consent was obtained for benefit of pain relief.  Laboratory Data: Admission on 11/08/2020,  Discharged on 11/09/2020  Component Date Value Ref Range Status  . WBC 11/09/2020 12.9* 4.0 - 10.5 K/uL Final  . RBC 11/09/2020 4.31  3.87 - 5.11 MIL/uL Final  . Hemoglobin 11/09/2020 13.0  12.0 - 15.0 g/dL Final  . HCT 11/09/2020 39.9  36.0 - 46.0 % Final  . MCV 11/09/2020 92.6  80.0 - 100.0 fL Final  . MCH 11/09/2020 30.2  26.0 - 34.0 pg Final  . MCHC 11/09/2020 32.6  30.0 - 36.0 g/dL Final  . RDW 11/09/2020 13.2  11.5 - 15.5 % Final  . Platelets 11/09/2020 212  150 - 400 K/uL Final  . nRBC 11/09/2020 0.0  0.0 - 0.2 % Final   Performed at [facility], [place] [address], [place], [place] [address]  . Sodium 11/09/2020 137  135 - 145 mmol/L Final  . Potassium 11/09/2020 3.6  3.5 - 5.1 mmol/L Final  . Chloride 11/09/2020 101  98 - 111 mmol/L Final  . CO2 11/09/2020 26  22 - 32 mmol/L Final  . Glucose, Bld 11/09/2020 114* 70 - 99 mg/dL Final   Glucose reference range applies only to samples taken after fasting for at least 8 hours.  . BUN 11/09/2020 17  6 - 20 mg/dL Final  . Creatinine, Ser 11/09/2020 0.84  0.44 - 1.00 mg/dL Final  . Calcium 11/09/2020 9.1  8.9 - 10.3 mg/dL Final  . GFR, Estimated 11/09/2020 >60  >60 mL/min Final   Comment: (NOTE) Calculated using the CKD-EPI Creatinine Equation (2021)   . Anion gap 11/09/2020  10  5 - 15 Final   Performed at [facility] [address] [place], [place] [address]  Hospital Outpatient Visit on 11/04/2020  Component Date Value Ref Range Status  . SARS Coronavirus 2 11/04/2020 NEGATIVE  NEGATIVE Final   Comment: (NOTE) SARS-CoV-2 target nucleic acids are NOT DETECTED.  The SARS-CoV-2 RNA is generally detectable in upper and lower respiratory specimens during the acute phase of infection. Negative results do not preclude SARS-CoV-2 infection, do not rule out co-infections with other pathogens, and should not be used as the sole basis for treatment or other patient management decisions. Negative  results must be combined with clinical observations, patient history, and epidemiological information. The expected result is Negative.  Fact Sheet for Patients: SugarRoll.be  Fact Sheet for Healthcare Providers: https://www.woods-mathews.com/  This test is not yet approved or cleared by the [place] FDA and  has been authorized for detection and/or diagnosis of SARS-CoV-2 by FDA under an Emergency Use Authorization (EUA). This EUA will remain  in effect (meaning this test can be used) for the duration of the COVID-19 declaration under Se                          ction 564(b)(1) of the Act, 21 U.S.C. section 360bbb-3(b)(1), unless the authorization is terminated or revoked sooner.  Performed at [facility], [place] [address], [place], [place] [address]   Hospital Outpatient Visit on 11/04/2020  Component Date Value Ref Range Status  . WBC 11/04/2020 6.1  4.0 - 10.5 K/uL Final  . RBC 11/04/2020 4.45  3.87 - 5.11 MIL/uL Final  . Hemoglobin 11/04/2020 13.2  12.0 - 15.0 g/dL Final  . HCT 11/04/2020 40.6  36.0 - 46.0 % Final  . MCV 11/04/2020 91.2  80.0 - 100.0 fL Final  . MCH 11/04/2020 29.7  26.0 - 34.0 pg Final  . MCHC 11/04/2020 32.5  30.0 - 36.0 g/dL Final  . RDW 11/04/2020 12.9  11.5 - 15.5 % Final  . Platelets 11/04/2020 211  150 - 400 K/uL Final  . nRBC 11/04/2020 0.0  0.0 - 0.2 % Final   Performed at [facility], [place] [address], [place], [place] [address]  . Sodium 11/04/2020 144  135 - 145 mmol/L Final  . Potassium 11/04/2020 3.4* 3.5 - 5.1 mmol/L Final  . Chloride 11/04/2020 106  98 - 111 mmol/L Final  . CO2 11/04/2020 28  22 - 32 mmol/L Final  . Glucose, Bld 11/04/2020 98  70 - 99 mg/dL Final   Glucose reference range applies only to samples taken after fasting for at least 8 hours.  . BUN 11/04/2020 15  6 - 20 mg/dL Final  . Creatinine, Ser 11/04/2020 0.80  0.44 - 1.00 mg/dL Final  . Calcium  11/04/2020 9.3  8.9 - 10.3 mg/dL Final  . Total Protein 11/04/2020 7.5  6.5 - 8.1 g/dL Final  . Albumin 11/04/2020 3.9  3.5 - 5.0 g/dL Final  . AST 11/04/2020 20  15 - 41 U/L Final  . ALT 11/04/2020 21  0 - 44 U/L Final  . Alkaline Phosphatase 11/04/2020 90  38 - 126 U/L Final  . Total Bilirubin 11/04/2020 0.5  0.3 - 1.2 mg/dL Final  . GFR, Estimated 11/04/2020 >60  >60 mL/min Final   Comment: (NOTE) Calculated using the CKD-EPI Creatinine Equation (2021)   . Anion gap 11/04/2020 10  5 - 15 Final   Performed at  [facility], [place] [address] [place], [place] [address]  . Prothrombin Time 11/04/2020 12.6  11.4 - 15.2 seconds Final  . INR 11/04/2020 1.0  0.8 - 1.2 Final   Comment: (NOTE) INR goal varies based on device and disease states. Performed at [facility], [place] [address], [place], [place] [address]   . aPTT 11/04/2020 34  24 - 36 seconds Final   Performed at [facility], [place] [address], [place], [place] [address]  . ABO/RH(D) 11/04/2020 O POS   Final  . Antibody Screen 11/04/2020 NEG   Final  . Sample Expiration 11/04/2020 11/11/2020,2359   Final  . Extend sample reason 11/04/2020    Final                   Value:NO TRANSFUSIONS OR PREGNANCY IN THE PAST 3 MONTHS Performed at [facility] [address], [place], [place] [address]   . MRSA, PCR 11/04/2020 NEGATIVE  NEGATIVE Final  . Staphylococcus aureus 11/04/2020 NEGATIVE  NEGATIVE Final   Comment: (NOTE) The Xpert SA Assay (FDA approved for NASAL specimens in patients 98 years of age and older), is one component of a comprehensive surveillance program. It is not intended to diagnose infection nor to guide or monitor treatment. Performed at [facility], [place] [address], [place],  [address]   Office Visit on 10/10/2020  Component Date Value Ref Range Status  . Glucose 10/10/2020 75  65 - 99 mg/dL Final  . BUN  10/10/2020 12  6 - 24 mg/dL Final  . Creatinine, Ser 10/10/2020 0.87  0.57 - 1.00 mg/dL Final  . eGFR 10/10/2020 83  >59 mL/min/1.73 Final  . BUN/Creatinine Ratio 10/10/2020 14  9 - 23 Final  . Sodium 10/10/2020 144  134 - 144 mmol/L Final  . Potassium 10/10/2020 3.7  3.5 - 5.2 mmol/L Final  . Chloride 10/10/2020 101  96 - 106 mmol/L Final  . CO2 10/10/2020 27  20 - 29 mmol/L Final  . Calcium 10/10/2020 9.5  8.7 - 10.2 mg/dL Final  . Total Protein 10/10/2020 7.4  6.0 - 8.5 g/dL Final  . Albumin 10/10/2020 4.5  3.8 - 4.8 g/dL Final  . Globulin, Total 10/10/2020 2.9  1.5 - 4.5 g/dL Final  . Albumin/Globulin Ratio 10/10/2020 1.6  1.2 - 2.2 Final  . Bilirubin Total 10/10/2020 0.2  0.0 - 1.2 mg/dL Final  . Alkaline Phosphatase 10/10/2020 121  44 - 121 IU/L Final  . AST 10/10/2020 18  0 - 40 IU/L Final  . ALT 10/10/2020 17  0 - 32 IU/L Final  . WBC 10/10/2020 6.8  3.4 - 10.8 x10E3/uL Final  . RBC 10/10/2020 4.58  3.77 - 5.28 x10E6/uL Final  . Hemoglobin 10/10/2020 13.7  11.1 - 15.9 g/dL Final  . Hematocrit 10/10/2020 42.0  34.0 - 46.6 % Final  . MCV 10/10/2020 92  79 - 97 fL Final  . MCH 10/10/2020 29.9  26.6 - 33.0 pg Final  . MCHC 10/10/2020 32.6  31.5 - 35.7 g/dL Final  . RDW 10/10/2020 12.6  11.7 - 15.4 % Final  . Platelets 10/10/2020 230  150 - 450 x10E3/uL Final  . Neutrophils 10/10/2020 57  Not Estab. % Final  . Lymphs 10/10/2020 30  Not Estab. % Final  . Monocytes 10/10/2020 10  Not Estab. % Final  . Eos 10/10/2020 2  Not Estab. % Final  . Basos 10/10/2020 1  Not Estab. %  Final  . Neutrophils Absolute 10/10/2020 3.9  1.4 - 7.0 x10E3/uL Final  . Lymphocytes Absolute 10/10/2020 2.0  0.7 - 3.1 x10E3/uL Final  . Monocytes Absolute 10/10/2020 0.7  0.1 - 0.9 x10E3/uL Final  . EOS (ABSOLUTE) 10/10/2020 0.1  0.0 - 0.4 x10E3/uL Final  . Basophils Absolute 10/10/2020 0.0  0.0 - 0.2 x10E3/uL Final  . Immature Granulocytes 10/10/2020 0  Not Estab. % Final  . Immature Grans (Abs) 10/10/2020  0.0  0.0 - 0.1 x10E3/uL Final     X-Rays:No results found.  EKG: Orders placed or performed in visit on 10/10/20  . EKG 12-Lead     Hospital Course: Erline Siddoway is a 45 y.o. who was admitted to [facility]. They were brought to the operating room on 11/08/2020 and underwent Procedure(s): RIGHT TOTAL KNEE REVISION.  Patient tolerated the procedure well and was later transferred to the recovery room and then to the orthopaedic floor for postoperative care. They were given PO and IV analgesics for pain control following their surgery. They were given 24 hours of postoperative antibiotics of  Anti-infectives (From admission, onward)   Start     Dose/Rate Route Frequency Ordered Stop   11/08/20 1330  ceFAZolin (ANCEF) IVPB 2g/100 mL premix        2 g 200 mL/hr over 30 Minutes Intravenous Every 6 hours 11/08/20 1140 11/08/20 1913   11/08/20 0918  tobramycin (NEBCIN) powder  Status:  Discontinued          As needed 11/08/20 0918 11/08/20 1133   11/08/20 0600  ceFAZolin (ANCEF) IVPB 3g/100 mL premix        3 g 200 mL/hr over 30 Minutes Intravenous On call to O.R. 11/08/20 0109 11/08/20 0737     and started on DVT prophylaxis in the form of Aspirin.   PT and OT were ordered for total joint protocol. Discharge planning consulted to help with postop disposition and equipment needs.  Patient had a good night on the evening of surgery. They started to get up OOB with therapy on POD #0. Pt was seen during rounds and was ready to go home pending progress with therapy.She worked with therapy on POD #1 and was meeting her goals. Pt was discharged to home later that day in stable condition.  Diet: Regular diet Activity: WBAT Follow-up: in 2 weeks Disposition: Home Discharged Condition: good   Discharge Instructions    Call MD / Call 911   Complete by: As directed    If you experience chest pain or shortness of breath, CALL 911 and be transported to the hospital emergency room.  If you  develope a fever above 101 F, pus (white drainage) or increased drainage or redness at the wound, or calf pain, call your surgeon's office.   Change dressing   Complete by: As directed    Maintain surgical dressing until follow up in the clinic. If the edges start to pull up, may reinforce with tape. If the dressing is no longer working, may remove and cover with gauze and tape, but must keep the area dry and clean.  Call with any questions or concerns.   Constipation Prevention   Complete by: As directed    Drink plenty of fluids.  Prune juice may be helpful.  You may use a stool softener, such as Colace (over the counter) 100 mg twice a day.  Use MiraLax (over the counter) for constipation as needed.   Diet - low sodium heart healthy  Complete by: As directed    Increase activity slowly as tolerated   Complete by: As directed    Weight bearing as tolerated with assist device (walker, cane, etc) as directed, use it as long as suggested by your surgeon or therapist, typically at least 4-6 weeks.   Post-operative opioid taper instructions:   Complete by: As directed    POST-OPERATIVE OPIOID TAPER INSTRUCTIONS: It is important to wean off of your opioid medication as soon as possible. If you do not need pain medication after your surgery it is ok to stop day one. Opioids include: Codeine, Hydrocodone(Norco, Vicodin), Oxycodone(Percocet, oxycontin) and hydromorphone amongst others.  Long term and even short term use of opiods can cause: Increased pain response Dependence Constipation Depression Respiratory depression And more.  Withdrawal symptoms can include Flu like symptoms Nausea, vomiting And more Techniques to manage these symptoms Hydrate well Eat regular healthy meals Stay active Use relaxation techniques(deep breathing, meditating, yoga) Do Not substitute Alcohol to help with tapering If you have been on opioids for less than two weeks and do not have pain than it is ok to  stop all together.  Plan to wean off of opioids This plan should start within one week post op of your joint replacement. Maintain the same interval or time between taking each dose and first decrease the dose.  Cut the total daily intake of opioids by one tablet each day Next start to increase the time between doses. The last dose that should be eliminated is the evening dose.      TED hose   Complete by: As directed    Use stockings (TED hose) for 2 weeks on both leg(s).  You may remove them at night for sleeping.     Allergies as of 11/09/2020      Reactions   Lisinopril Cough      Medication List    STOP taking these medications   acetaminophen 500 MG tablet Commonly known as: TYLENOL   ibuprofen 200 MG tablet Commonly known as: ADVIL     TAKE these medications   amLODipine 10 MG tablet Commonly known as: NORVASC Take 10 mg by mouth daily.   aspirin 81 MG chewable tablet Chew 1 tablet (81 mg total) by mouth 2 (two) times daily for 28 days.   celecoxib 200 MG capsule Commonly known as: CELEBREX Take 1 capsule (200 mg total) by mouth 2 (two) times daily.   cycloSPORINE 0.05 % ophthalmic emulsion Commonly known as: RESTASIS Place 1 drop into both eyes 2 (two) times daily as needed (dry eyes).   docusate sodium 100 MG capsule Commonly known as: COLACE Take 1 capsule (100 mg total) by mouth 2 (two) times daily.   HYDROcodone-acetaminophen 7.5-325 MG tablet Commonly known as: NORCO Take 1-2 tablets by mouth every 4 (four) hours as needed for severe pain (pain score 7-10).   losartan-hydrochlorothiazide 100-25 MG tablet Commonly known as: HYZAAR Take 1 tablet by mouth daily.   methocarbamol 500 MG tablet Commonly known as: ROBAXIN Take 1 tablet (500 mg total) by mouth every 6 (six) hours as needed for muscle spasms.   multivitamin with minerals tablet Take 1 tablet by mouth daily.   polyethylene glycol 17 g packet Commonly known as: MIRALAX / GLYCOLAX Take  17 g by mouth daily as needed for mild constipation.   Ursodiol 200 MG Caps Take 200 mg by mouth 2 (two) times daily.   Vitamin D (Ergocalciferol) 1.25 MG (50000 UNIT) Caps capsule Commonly known  as: DRISDOL Take 50,000 Units by mouth every Monday.            Discharge Care Instructions  (From admission, onward)         Start     Ordered   11/09/20 0000  Change dressing       Comments: Maintain surgical dressing until follow up in the clinic. If the edges start to pull up, may reinforce with tape. If the dressing is no longer working, may remove and cover with gauze and tape, but must keep the area dry and clean.  Call with any questions or concerns.   11/09/20 4715          Follow-up Information    Paralee Cancel, MD. Schedule an appointment as soon as possible for a visit in 2 weeks.   Specialty: Orthopedic Surgery Contact information: [address] [place] [address] [phone]               Signed: Griffith Citron, PA-C Orthopedic Surgery 11/17/2020, 7:31 AM

## 2020-11-18 DIAGNOSIS — M25561 Pain in right knee: Secondary | ICD-10-CM | POA: Diagnosis not present

## 2020-11-18 DIAGNOSIS — T8450XD Infection and inflammatory reaction due to unspecified internal joint prosthesis, subsequent encounter: Secondary | ICD-10-CM | POA: Diagnosis not present

## 2020-11-22 DIAGNOSIS — H5203 Hypermetropia, bilateral: Secondary | ICD-10-CM | POA: Diagnosis not present

## 2020-11-22 DIAGNOSIS — H5213 Myopia, bilateral: Secondary | ICD-10-CM | POA: Diagnosis not present

## 2020-11-22 DIAGNOSIS — H524 Presbyopia: Secondary | ICD-10-CM | POA: Diagnosis not present

## 2020-11-22 DIAGNOSIS — H52223 Regular astigmatism, bilateral: Secondary | ICD-10-CM | POA: Diagnosis not present

## 2020-11-25 DIAGNOSIS — M25561 Pain in right knee: Secondary | ICD-10-CM | POA: Diagnosis not present

## 2020-11-25 DIAGNOSIS — T8450XD Infection and inflammatory reaction due to unspecified internal joint prosthesis, subsequent encounter: Secondary | ICD-10-CM | POA: Diagnosis not present

## 2020-12-01 DIAGNOSIS — T8450XD Infection and inflammatory reaction due to unspecified internal joint prosthesis, subsequent encounter: Secondary | ICD-10-CM | POA: Diagnosis not present

## 2020-12-01 DIAGNOSIS — M25561 Pain in right knee: Secondary | ICD-10-CM | POA: Diagnosis not present

## 2020-12-07 DIAGNOSIS — T8450XD Infection and inflammatory reaction due to unspecified internal joint prosthesis, subsequent encounter: Secondary | ICD-10-CM | POA: Diagnosis not present

## 2020-12-07 DIAGNOSIS — M25561 Pain in right knee: Secondary | ICD-10-CM | POA: Diagnosis not present

## 2020-12-15 DIAGNOSIS — M5416 Radiculopathy, lumbar region: Secondary | ICD-10-CM | POA: Diagnosis not present

## 2020-12-15 DIAGNOSIS — T8450XD Infection and inflammatory reaction due to unspecified internal joint prosthesis, subsequent encounter: Secondary | ICD-10-CM | POA: Diagnosis not present

## 2020-12-15 DIAGNOSIS — M25561 Pain in right knee: Secondary | ICD-10-CM | POA: Diagnosis not present

## 2020-12-15 DIAGNOSIS — I10 Essential (primary) hypertension: Secondary | ICD-10-CM | POA: Diagnosis not present

## 2020-12-15 DIAGNOSIS — Z6839 Body mass index (BMI) 39.0-39.9, adult: Secondary | ICD-10-CM | POA: Diagnosis not present

## 2020-12-28 DIAGNOSIS — Z471 Aftercare following joint replacement surgery: Secondary | ICD-10-CM | POA: Diagnosis not present

## 2020-12-28 DIAGNOSIS — Z96651 Presence of right artificial knee joint: Secondary | ICD-10-CM | POA: Diagnosis not present

## 2021-02-09 DIAGNOSIS — I10 Essential (primary) hypertension: Secondary | ICD-10-CM | POA: Diagnosis not present

## 2021-02-09 DIAGNOSIS — T63441A Toxic effect of venom of bees, accidental (unintentional), initial encounter: Secondary | ICD-10-CM | POA: Diagnosis not present

## 2021-03-23 ENCOUNTER — Telehealth: Payer: Self-pay | Admitting: Family

## 2021-03-23 NOTE — Telephone Encounter (Signed)
Appt made

## 2021-03-24 ENCOUNTER — Ambulatory Visit: Payer: BC Managed Care – PPO

## 2021-03-27 ENCOUNTER — Encounter: Payer: Self-pay | Admitting: Family

## 2021-03-27 ENCOUNTER — Ambulatory Visit (INDEPENDENT_AMBULATORY_CARE_PROVIDER_SITE_OTHER): Payer: BC Managed Care – PPO | Admitting: Family

## 2021-03-27 DIAGNOSIS — I10 Essential (primary) hypertension: Secondary | ICD-10-CM | POA: Diagnosis not present

## 2021-03-27 DIAGNOSIS — Z96651 Presence of right artificial knee joint: Secondary | ICD-10-CM | POA: Diagnosis not present

## 2021-03-27 DIAGNOSIS — Z1211 Encounter for screening for malignant neoplasm of colon: Secondary | ICD-10-CM

## 2021-03-27 DIAGNOSIS — R1011 Right upper quadrant pain: Secondary | ICD-10-CM | POA: Diagnosis not present

## 2021-03-27 DIAGNOSIS — K8010 Calculus of gallbladder with chronic cholecystitis without obstruction: Secondary | ICD-10-CM

## 2021-03-27 DIAGNOSIS — M159 Polyosteoarthritis, unspecified: Secondary | ICD-10-CM

## 2021-03-27 MED ORDER — AMLODIPINE BESYLATE 10 MG PO TABS: 10.0000 mg | ORAL_TABLET | Freq: Every day | ORAL | 2 refills | Status: DC

## 2021-03-27 MED ORDER — DICLOFENAC SODIUM 75 MG PO TBEC: 75.0000 mg | DELAYED_RELEASE_TABLET | Freq: Two times a day (BID) | ORAL | 2 refills | Status: DC

## 2021-03-27 MED ORDER — LOSARTAN POTASSIUM-HCTZ 100-25 MG PO TABS: 1.0000 | ORAL_TABLET | Freq: Every day | ORAL | 3 refills | Status: DC

## 2021-03-27 NOTE — Progress Notes (Signed)
Virtual Visit  Note Due to COVID-19 pandemic this visit was conducted virtually. This visit type was conducted due to national recommendations for restrictions regarding the COVID-19 Pandemic (e.g. social distancing, sheltering in place) in an effort to limit this patient's exposure and mitigate transmission in our community. All issues noted in this document were discussed and addressed.  A physical exam was not performed with this format.  I connected with Carol Vargas on 03/27/21 at 1:03 pm  by telephone and verified that I am speaking with the correct person using two identifiers. Carol Vargas is currently located at outside and no one is currently with her during visit. The provider, Evelina Dun, FNP is located in their office at time of visit.  I discussed the limitations, risks, security and privacy concerns of performing an evaluation and management service by telephone and the availability of in person appointments. I also discussed with the patient that there may be a patient responsible charge related to this service. The patient expressed understanding and agreed to proceed.   History and Present Illness:  Pt calls the office today with complaints of generalized joint pain. She had a right total knee revision on 11/08/20 and her knee pain is greatly improved.   She is also complaining of RUQ pain that is worse after eating. She had a [place] of her gallbladder on 03/28/20 and found to have a stone. She was given Ursodiol, but states this has not helped.  Arthritis Presents for follow-up visit. She complains of pain, stiffness and joint warmth. The symptoms have been stable. Affected locations include the left knee, right knee, left shoulder and right shoulder. Her pain is at a severity of 5/10. Pertinent negatives include no diarrhea or dysuria.  Abdominal Pain This is a chronic problem. The current episode started more than 1 year ago. The onset quality is gradual. The pain is located  in the RUQ. The pain is at a severity of 5/10. The pain is mild. The quality of the pain is cramping and sharp. The abdominal pain does not radiate. Associated symptoms include nausea. Pertinent negatives include no belching, constipation, diarrhea, dysuria, headaches, hematuria or vomiting. Associated symptoms comments: Diarrhea . The pain is aggravated by eating.  Hypertension This is a chronic problem. The current episode started more than 1 year ago. The problem has been resolved since onset. The problem is controlled. Pertinent negatives include no headaches, malaise/fatigue, peripheral edema or shortness of breath. There is no history of heart failure.     Review of Systems  Constitutional:  Negative for malaise/fatigue.  Respiratory:  Negative for shortness of breath.   Gastrointestinal:  Positive for abdominal pain and nausea. Negative for constipation, diarrhea and vomiting.  Genitourinary:  Negative for dysuria and hematuria.  Musculoskeletal:  Positive for arthritis and stiffness.  Neurological:  Negative for headaches.    Observations/Objective: No SOB or distress noted   Assessment and Plan: 1. Primary hypertension - losartan-hydrochlorothiazide (HYZAAR) 100-25 MG tablet; Take 1 tablet by mouth daily.  Dispense: 90 tablet; Refill: 3 - amLODipine (NORVASC) 10 MG tablet; Take 1 tablet (10 mg total) by mouth daily.  Dispense: 90 tablet; Refill: 2  2. Morbid obesity ([place])  3. Status post total right knee replacement  4. RUQ pain - Ambulatory referral to General Surgery  5. Calculus of gallbladder with chronic cholecystitis without obstruction - Ambulatory referral to General Surgery  6. Colon cancer screening - Ambulatory referral to Gastroenterology  7. Osteoarthritis of multiple joints, unspecified osteoarthritis  type Take Diclofenac BID No other NSAID's  - diclofenac (VOLTAREN) 75 MG EC tablet; Take 1 tablet (75 mg total) by mouth 2 (two) times daily.  Dispense: 60  tablet; Refill: 2     I discussed the assessment and treatment plan with the patient. The patient was provided an opportunity to ask questions and all were answered. The patient agreed with the plan and demonstrated an understanding of the instructions.   The patient was advised to call back or seek an in-person evaluation if the symptoms worsen or if the condition fails to improve as anticipated.  The above assessment and management plan was discussed with the patient. The patient verbalized understanding of and has agreed to the management plan. Patient is aware to call the clinic if symptoms persist or worsen. Patient is aware when to return to the clinic for a follow-up visit. Patient educated on when it is appropriate to go to the emergency department.   Time call ended:  1:25 pm   I provided 22 minutes of  non face-to-face time during this encounter.    Evelina Dun, FNP

## 2021-03-30 ENCOUNTER — Encounter: Payer: Self-pay | Admitting: Internal Medicine

## 2021-04-17 ENCOUNTER — Ambulatory Visit (INDEPENDENT_AMBULATORY_CARE_PROVIDER_SITE_OTHER): Payer: BC Managed Care – PPO | Admitting: Nurse Practitioner

## 2021-04-17 ENCOUNTER — Encounter: Payer: Self-pay | Admitting: Nurse Practitioner

## 2021-04-17 ENCOUNTER — Other Ambulatory Visit: Payer: Self-pay

## 2021-04-17 VITALS — BP 131/84 | HR 75 | Temp 97.2°F | Ht 67.5 in | Wt 269.0 lb

## 2021-04-17 DIAGNOSIS — Z23 Encounter for immunization: Secondary | ICD-10-CM | POA: Diagnosis not present

## 2021-04-17 DIAGNOSIS — R300 Dysuria: Secondary | ICD-10-CM | POA: Diagnosis not present

## 2021-04-17 LAB — MICROSCOPIC EXAMINATION
RBC, Urine: NONE SEEN /hpf (ref 0–2)
Renal Epithel, UA: NONE SEEN /hpf

## 2021-04-17 LAB — URINALYSIS, ROUTINE W REFLEX MICROSCOPIC
Bilirubin, UA: NEGATIVE
Glucose, UA: NEGATIVE
Ketones, UA: NEGATIVE
Nitrite, UA: NEGATIVE
Protein,UA: NEGATIVE
RBC, UA: NEGATIVE
Specific Gravity, UA: 1.02 (ref 1.005–1.030)
Urobilinogen, Ur: 0.2 mg/dL (ref 0.2–1.0)
pH, UA: 7 (ref 5.0–7.5)

## 2021-04-17 MED ORDER — SULFAMETHOXAZOLE-TRIMETHOPRIM 800-160 MG PO TABS
1.0000 | ORAL_TABLET | Freq: Two times a day (BID) | ORAL | 0 refills | Status: DC
Start: 2021-04-17 — End: 2021-04-27

## 2021-04-17 NOTE — Progress Notes (Signed)
Acute Office Visit  Subjective:    Patient ID: Carol Vargas, female    DOB: 21-Jul-1975, 45 y.o.   MRN: [id number]  Chief Complaint  Patient presents with   Back Pain   Dysuria    Back Pain This is a new problem. Episode onset: past 4 days ago. The problem occurs constantly. The problem has been gradually worsening since onset. The quality of the pain is described as aching. The pain does not radiate. The pain is moderate. Stiffness is present All day. Associated symptoms include dysuria. Pertinent negatives include no abdominal pain, numbness or paresis. She has tried analgesics for the symptoms. The treatment provided mild relief.  Dysuria  This is a new problem. Episode onset: past 4 days. The problem has been unchanged. The quality of the pain is described as aching. The pain is moderate. She is Not sexually active. There is No history of pyelonephritis. Associated symptoms include flank pain and frequency. Pertinent negatives include no chills, discharge, nausea, urgency or vomiting. She has tried nothing for the symptoms.   Past Medical History:  Diagnosis Date   Arthritis    Back pain    Bilateral swelling of feet    Fibroids    Gallstone 2021   Headache(784.0)    at times, nothing severe per pt.menstral   High cholesterol    Hypertension    Joint pain    Sjogrens syndrome (HCC)    Vitamin D deficiency     Past Surgical History:  Procedure Laterality Date   ABDOMINAL HYSTERECTOMY     BACK SURGERY     L4 L5 fusion and instrumentation Jul 06 2020    BILATERAL SALPINGECTOMY Right 01/29/2017   Procedure: RIGHT SALPINGECTOMY;  Surgeon: Tilda Burrow, MD;  Location: [facility];  Service: Gynecology;  Laterality: Right;   BREAST REDUCTION SURGERY Bilateral 05/12/2019   Procedure: BILATERAL MAMMARY REDUCTION  (BREAST);  Surgeon: Allena Napoleon, MD;  Location: [facility];  Service: Plastics;  Laterality: Bilateral;  3.5 hours, please   CESAREAN SECTION      DILATION AND CURETTAGE OF UTERUS     EXTERNAL FIXATION LEG Right 07/05/2013   Procedure: EXTERNAL FIXATION LEG;  Surgeon: Cheral Almas, MD;  Location: [facility];  Service: Orthopedics;  Laterality: Right;   EXTERNAL FIXATION LEG Right 07/07/2013   Procedure: Ex-Fix Revision Right Tibial Plateau;  Surgeon: Budd Palmer, MD;  Location: [facility] OR;  Service: Orthopedics;  Laterality: Right;   EXTERNAL FIXATION REMOVAL Right 07/28/2013   Procedure: REMOVAL EXTERNAL FIXATION LEG;  Surgeon: Budd Palmer, MD;  Location: [facility];  Service: Orthopedics;  Laterality: Right;   HARDWARE REMOVAL Right 08/02/2014   Procedure: HARDWARE REMOVAL;  Surgeon: Dannielle Huh, MD;  Location: [facility] OR;  Service: Orthopedics;  Laterality: Right;   KNEE ARTHROSCOPY Right 08/02/2014   Procedure: ARTHROSCOPY KNEE;  Surgeon: Dannielle Huh, MD;  Location: [facility] OR;  Service: Orthopedics;  Laterality: Right;   ORIF TIBIA PLATEAU Right 07/28/2013   Procedure: OPEN REDUCTION INTERNAL FIXATION (ORIF) RIGHT TIBIAL PLATEAU;  Surgeon: Budd Palmer, MD;  Location: [facility];  Service: Orthopedics;  Laterality: Right;   SUPRACERVICAL ABDOMINAL HYSTERECTOMY N/A 01/29/2017   Procedure: HYSTERECTOMY SUPRACERVICAL ABDOMINAL;  Surgeon: Tilda Burrow, MD;  Location: [facility];  Service: Gynecology;  Laterality: N/A;   TONSILLECTOMY  1992   TOTAL KNEE ARTHROPLASTY Right 01/03/2015   Procedure: TOTAL KNEE ARTHROPLASTY;  Surgeon: Dannielle Huh, MD;  Location: [facility];  Service: Orthopedics;  Laterality: Right;  former tibial plateau fracture orif with hardware removed 07/2014   TOTAL KNEE REVISION Right 11/08/2020   Procedure: RIGHT TOTAL KNEE REVISION;  Surgeon: Paralee Cancel, MD;  Location: [facility];  Service: Orthopedics;  Laterality: Right;    Family History  Problem Relation Age of Onset   Hypertension Mother    Diabetes Mellitus II Mother    Osteoporosis Mother    Fibroids Mother    Diabetes Mother    Hyperlipidemia Mother    Obesity Mother    Stroke  Father        died from this   Hypertension Father    Lung cancer Maternal Aunt    Fibroids Sister     Social History   Socioeconomic History   Marital status: Divorced    Spouse name: Not on file   Number of children: Not on file   Years of education: Not on file   Highest education level: Not on file  Occupational History   Not on file  Tobacco Use   Smoking status: Former    Packs/day: 0.50    Years: 5.00    Pack years: 2.50    Types: Cigarettes    Quit date: 02/06/2013    Years since quitting: 8.1   Smokeless tobacco: Never  Vaping Use   Vaping Use: Never used  Substance and Sexual Activity   Alcohol use: No   Drug use: No   Sexual activity: Not Currently    Birth control/protection: Surgical    Comment: hyst  Other Topics Concern   Not on file  Social History Narrative   Not on file   Social Determinants of Health   Financial Resource Strain: Not on file  Food Insecurity: Not on file  Transportation Needs: Not on file  Physical Activity: Not on file  Stress: Not on file  Social Connections: Not on file  Intimate Partner Violence: Not on file    Outpatient Medications Prior to Visit  Medication Sig Dispense Refill   amLODipine (NORVASC) 10 MG tablet Take 1 tablet (10 mg total) by mouth daily. 90 tablet 2   diclofenac (VOLTAREN) 75 MG EC tablet Take 1 tablet (75 mg total) by mouth 2 (two) times daily. 60 tablet 2   losartan-hydrochlorothiazide (HYZAAR) 100-25 MG tablet Take 1 tablet by mouth daily. 90 tablet 3   Multiple Vitamins-Minerals (MULTIVITAMIN WITH MINERALS) tablet Take 1 tablet by mouth daily.     Ursodiol 200 MG CAPS Take 200 mg by mouth 2 (two) times daily.     No facility-administered medications prior to visit.    Allergies  Allergen Reactions   Lisinopril Cough    Review of Systems  Constitutional:  Negative for chills.  HENT: Negative.    Respiratory: Negative.    Gastrointestinal:  Negative for abdominal pain, nausea and  vomiting.  Genitourinary:  Positive for dysuria, flank pain and frequency. Negative for urgency.  Musculoskeletal:  Positive for back pain.  Neurological:  Negative for numbness.  All other systems reviewed and are negative.     Objective:    Physical Exam Vitals and nursing note reviewed.  Constitutional:      Appearance: Normal appearance.  HENT:     Head: Normocephalic.     Nose: Nose normal. No congestion.     Mouth/Throat:     Mouth: Mucous membranes are moist.     Pharynx: Oropharynx is clear.  Eyes:     Conjunctiva/sclera: Conjunctivae normal.  Cardiovascular:  Rate and Rhythm: Normal rate and regular rhythm.     Pulses: Normal pulses.     Heart sounds: Normal heart sounds.  Pulmonary:     Effort: Pulmonary effort is normal.     Breath sounds: Normal breath sounds.  Abdominal:     General: Bowel sounds are normal.     Tenderness: There is right CVA tenderness and left CVA tenderness.  Skin:    General: Skin is warm.     Findings: No rash.  Neurological:     Mental Status: She is alert and oriented to person, place, and time.  Psychiatric:        Behavior: Behavior normal.    BP 131/84   Pulse 75   Temp (!) 97.2 F (36.2 C) (Temporal)   Ht 5' 7.5" (1.715 m)   Wt 269 lb (122 kg)   LMP 01/06/2017 (Exact Date)   BMI 41.51 kg/m  Wt Readings from Last 3 Encounters:  04/17/21 269 lb (122 kg)  11/08/20 265 lb (120.2 kg)  11/04/20 265 lb (120.2 kg)    Health Maintenance Due  Topic Date Due   Hepatitis C Screening  Never done   COLONOSCOPY (Pts 45-9yrs Insurance coverage will need to be confirmed)  Never done   COVID-19 Vaccine (3 - Booster for Moderna series) 01/09/2020    There are no preventive care reminders to display for this patient.   Lab Results  Component Value Date   TSH 1.550 11/30/2019   Lab Results  Component Value Date   WBC 12.9 (H) 11/09/2020   HGB 13.0 11/09/2020   HCT 39.9 11/09/2020   MCV 92.6 11/09/2020   PLT 212  11/09/2020   Lab Results  Component Value Date   NA 137 11/09/2020   K 3.6 11/09/2020   CO2 26 11/09/2020   GLUCOSE 114 (H) 11/09/2020   BUN 17 11/09/2020   CREATININE 0.84 11/09/2020   BILITOT 0.5 11/04/2020   ALKPHOS 90 11/04/2020   AST 20 11/04/2020   ALT 21 11/04/2020   PROT 7.5 11/04/2020   ALBUMIN 3.9 11/04/2020   CALCIUM 9.1 11/09/2020   ANIONGAP 10 11/09/2020   EGFR 83 10/10/2020   Lab Results  Component Value Date   CHOL 227 (A) 11/25/2019   Lab Results  Component Value Date   HDL 65 11/25/2019   Lab Results  Component Value Date   LDLCALC 126 11/25/2019   Lab Results  Component Value Date   TRIG 180 (A) 11/25/2019   Lab Results  Component Value Date   CHOLHDL 3.3 09/27/2016   Lab Results  Component Value Date   HGBA1C 5.7 11/25/2019       Assessment & Plan:   Problem List Items Addressed This Visit       Other   Dysuria - Primary    Dysuria not well controlled, completed urinalysis results pending, increase hydration, reduce caffeine and sugar.  Follow with worseng and unresolved symptoms. Urinalysis positive for Leukocytes and bacteria. Started patient on Bactrim DS, cultures completed results pending. Education and printed hand out given to patient.       Relevant Medications   sulfamethoxazole-trimethoprim (BACTRIM DS) 800-160 MG tablet   Other Relevant Orders   Urinalysis, Routine w reflex microscopic   CULTURE, URINE COMPREHENSIVE   Other Visit Diagnoses     Need for immunization against influenza       Relevant Orders   Flu Vaccine QUAD 21mo+IM (Fluarix, Fluzone & Alfiuria Quad PF) (Completed)  Meds ordered this encounter  Medications   sulfamethoxazole-trimethoprim (BACTRIM DS) 800-160 MG tablet    Sig: Take 1 tablet by mouth 2 (two) times daily.    Dispense:  20 tablet    Refill:  0    Order Specific Question:   Supervising Provider    Answer:   Janora Norlander [[id number]]     Ivy Lynn, NP

## 2021-04-17 NOTE — Patient Instructions (Signed)
Dysuria ?Dysuria is pain or discomfort during urination. The pain or discomfort may be felt in the part of the body that drains urine from the bladder (urethra) or in the surrounding tissue of the genitals. The pain may also be felt in the groin area, lower abdomen, or lower back. ?You may have to urinate frequently or have the sudden feeling that you have to urinate (urgency). Dysuria can affect anyone, but it is more common in females. Dysuria can be caused by many different things, including: ?Urinary tract infection. ?Kidney stones or bladder stones. ?Certain STIs (sexually transmitted infections), such as chlamydia. ?Dehydration. ?Inflammation of the tissues of the vagina. ?Use of certain medicines. ?Use of certain soaps or scented products that cause irritation. ?Follow these instructions at home: ?Medicines ?Take over-the-counter and prescription medicines only as told by your health care provider. ?If you were prescribed an antibiotic medicine, take it as told by your health care provider. Do not stop taking the antibiotic even if you start to feel better. ?Eating and drinking ? ?Drink enough fluid to keep your urine pale yellow. ?Avoid caffeinated beverages, tea, and alcohol. These beverages can irritate the bladder and make dysuria worse. In males, alcohol may irritate the prostate. ?General instructions ?Watch your condition for any changes. ?Urinate often. Avoid holding urine for long periods of time. ?If you are female, you should wipe from front to back after urinating or having a bowel movement. Use each piece of toilet paper only once. ?Empty your bladder after sex. ?Keep all follow-up visits. This is important. ?If you had any tests done to find the cause of dysuria, it is up to you to get your test results. Ask your health care provider, or the department that is doing the test, when your results will be ready. ?Contact a health care provider if: ?You have a fever. ?You develop pain in your back or  sides. ?You have nausea or vomiting. ?You have blood in your urine. ?You are not urinating as often as you usually do. ?Get help right away if: ?Your pain is severe and not relieved with medicines. ?You cannot eat or drink without vomiting. ?You are confused. ?You have a rapid heartbeat while resting. ?You have shaking or chills. ?You feel extremely weak. ?Summary ?Dysuria is pain or discomfort while urinating. Many different conditions can lead to dysuria. ?If you have dysuria, you may have to urinate frequently or have the sudden feeling that you have to urinate (urgency). ?Watch your condition for any changes. Keep all follow-up visits. ?Make sure that you urinate often and drink enough fluid to keep your urine pale yellow. ?This information is not intended to replace advice given to you by your health care provider. Make sure you discuss any questions you have with your health care provider. ?Document Revised: 02/05/2020 Document Reviewed: 02/05/2020 ?Elsevier Patient Education ? 2022 Elsevier Inc[employer] ? ?

## 2021-04-17 NOTE — Assessment & Plan Note (Deleted)
Dysuria not well controlled, completed urinalysis results pending, increase hydration, reduce caffeine and sugar.  Follow with worseng and unresolved symptoms. Urinalysis positive for Leukocytes and bacteria. Started patient on Bactrim DS, cultures completed results pending. Education and printed hand out given to patient.

## 2021-04-17 NOTE — Assessment & Plan Note (Signed)
Dysuria not well controlled, completed urinalysis results pending, increase hydration, reduce caffeine and sugar.  Follow with worseng and unresolved symptoms. Urinalysis positive for Leukocytes and bacteria. Started patient on Bactrim DS, cultures completed results pending. Education and printed hand out given to patient.

## 2021-04-18 ENCOUNTER — Ambulatory Visit (INDEPENDENT_AMBULATORY_CARE_PROVIDER_SITE_OTHER): Payer: BC Managed Care – PPO | Admitting: General Surgery

## 2021-04-18 ENCOUNTER — Encounter: Payer: Self-pay | Admitting: General Surgery

## 2021-04-18 VITALS — BP 126/84 | HR 78 | Temp 98.6°F | Resp 14 | Ht 67.5 in | Wt 271.0 lb

## 2021-04-18 DIAGNOSIS — K8020 Calculus of gallbladder without cholecystitis without obstruction: Secondary | ICD-10-CM

## 2021-04-18 NOTE — Progress Notes (Signed)
[facility] History and Physical  Reason for Referral: Gallstones Referring Physician: Sharion Balloon, FNP   Chief Complaint   New Patient (Initial Visit)     Carol Vargas is a 45 y.o. female.  HPI: Carol Vargas is a 31 who has known gallstones and associated abdominal pain, nausea especially with greasy foods. She has been having worsening pain and did try some ursodiol at some point. She says that this is not working any more. She has been having some loose stools. She is ready to have her gallbladder removed.    Past Medical History:  Diagnosis Date  . Arthritis   . Back pain   . Bilateral swelling of feet   . Fibroids   . Gallstone 2021  . Headache(784.0)    at times, nothing severe per pt.menstral  . High cholesterol   . Hypertension   . Joint pain   . Sjogrens syndrome ([place])   . Vitamin D deficiency     Past Surgical History:  Procedure Laterality Date  . ABDOMINAL HYSTERECTOMY    . BACK SURGERY     L4 L5 fusion and instrumentation Jul 06 2020   . BILATERAL SALPINGECTOMY Right 01/29/2017   Procedure: RIGHT SALPINGECTOMY;  Surgeon: Jonnie Kind, MD;  Location: [facility];  Service: Gynecology;  Laterality: Right;  . BREAST REDUCTION SURGERY Bilateral 05/12/2019   Procedure: BILATERAL MAMMARY REDUCTION  (BREAST);  Surgeon: [name], MD;  Location: [place];  Service: Plastics;  Laterality: Bilateral;  3.5 hours, please  . CESAREAN SECTION    . DILATION AND CURETTAGE OF UTERUS    . EXTERNAL FIXATION LEG Right 07/05/2013   Procedure: EXTERNAL FIXATION LEG;  Surgeon: Marianna Payment, MD;  Location: [place];  Service: Orthopedics;  Laterality: Right;  . EXTERNAL FIXATION LEG Right 07/07/2013   Procedure: Ex-Fix Revision Right Tibial Plateau;  Surgeon: Rozanna Box, MD;  Location: [place];  Service: Orthopedics;  Laterality: Right;  . EXTERNAL FIXATION REMOVAL Right 07/28/2013   Procedure: REMOVAL EXTERNAL FIXATION LEG;  Surgeon:  Rozanna Box, MD;  Location: [place];  Service: Orthopedics;  Laterality: Right;  . HARDWARE REMOVAL Right 08/02/2014   Procedure: HARDWARE REMOVAL;  Surgeon: Vickey Huger, MD;  Location: [place];  Service: Orthopedics;  Laterality: Right;  . KNEE ARTHROSCOPY Right 08/02/2014   Procedure: ARTHROSCOPY KNEE;  Surgeon: Vickey Huger, MD;  Location: [place];  Service: Orthopedics;  Laterality: Right;  . ORIF TIBIA PLATEAU Right 07/28/2013   Procedure: OPEN REDUCTION INTERNAL FIXATION (ORIF) RIGHT TIBIAL PLATEAU;  Surgeon: Rozanna Box, MD;  Location: [place];  Service: Orthopedics;  Laterality: Right;  . SUPRACERVICAL ABDOMINAL HYSTERECTOMY N/A 01/29/2017   Procedure: HYSTERECTOMY SUPRACERVICAL ABDOMINAL;  Surgeon: Jonnie Kind, MD;  Location: [facility];  Service: Gynecology;  Laterality: N/A;  . TONSILLECTOMY  1992  . TOTAL KNEE ARTHROPLASTY Right 01/03/2015   Procedure: TOTAL KNEE ARTHROPLASTY;  Surgeon: Vickey Huger, MD;  Location: [place];  Service: Orthopedics;  Laterality: Right;  former tibial plateau fracture orif with hardware removed 07/2014  . TOTAL KNEE REVISION Right 11/08/2020   Procedure: RIGHT TOTAL KNEE REVISION;  Surgeon: Paralee Cancel, MD;  Location: [facility];  Service: Orthopedics;  Laterality: Right;    Family History  Problem Relation Age of Onset  . Hypertension Mother   . Diabetes Mellitus II Mother   . Osteoporosis Mother   . Fibroids Mother   . Diabetes Mother   . Hyperlipidemia Mother   .  Obesity Mother   . Stroke Father        died from this  . Hypertension Father   . Lung cancer Maternal Aunt   . Fibroids Sister     Social History   Tobacco Use  . Smoking status: Former    Packs/day: 0.50    Years: 5.00    Pack years: 2.50    Types: Cigarettes    Quit date: 02/06/2013    Years since quitting: 8.2  . Smokeless tobacco: Never  Vaping Use  . Vaping Use: Never used  Substance Use Topics  . Alcohol use: No  . Drug use: No    Medications: I have reviewed the  patient's current medications. Allergies as of 04/18/2021       Reactions   Lisinopril Cough        Medication List        Accurate as of April 18, 2021  9:15 AM. If you have any questions, ask your nurse or doctor.          amLODipine 10 MG tablet Commonly known as: NORVASC Take 1 tablet (10 mg total) by mouth daily.   diclofenac 75 MG EC tablet Commonly known as: VOLTAREN Take 1 tablet (75 mg total) by mouth 2 (two) times daily.   losartan-hydrochlorothiazide 100-25 MG tablet Commonly known as: HYZAAR Take 1 tablet by mouth daily.   multivitamin with minerals tablet Take 1 tablet by mouth daily.   sulfamethoxazole-trimethoprim 800-160 MG tablet Commonly known as: Bactrim DS Take 1 tablet by mouth 2 (two) times daily.   Ursodiol 200 MG Caps Take 200 mg by mouth 2 (two) times daily.         ROS:  A comprehensive review of systems was negative except for: Gastrointestinal: positive for abdominal pain, nausea, and reflux symptoms Musculoskeletal: positive for back pain, neck pain, and neck pain  Blood pressure 126/84, pulse 78, temperature 98.6 F (37 C), temperature source Oral, resp. rate 14, height 5' 7.5" (1.715 m), weight 271 lb (122.9 kg), last menstrual period 01/06/2017, SpO2 98 %. Physical Exam Vitals reviewed.  Constitutional:      Appearance: Normal appearance.  HENT:     Head: Normocephalic.     Nose: Nose normal.     Mouth/Throat:     Mouth: Mucous membranes are moist.  Eyes:     Extraocular Movements: Extraocular movements intact.  Cardiovascular:     Rate and Rhythm: Normal rate and regular rhythm.  Pulmonary:     Effort: Pulmonary effort is normal.     Breath sounds: Normal breath sounds.  Abdominal:     General: There is no distension.     Palpations: Abdomen is soft.     Tenderness: There is abdominal tenderness in the right upper quadrant and epigastric area.  Musculoskeletal:        General: Normal range of motion.      Cervical back: Normal range of motion.  Skin:    General: Skin is warm.  Neurological:     General: No focal deficit present.     Mental Status: She is alert and oriented to person, place, and time.  Psychiatric:        Mood and Affect: Mood normal.        Behavior: Behavior normal.        Thought Content: Thought content normal.        Judgment: Judgment normal.    Results: Results for orders placed or performed in visit on  04/17/21 (from the past 48 hour(s))  Urinalysis, Routine w reflex microscopic     Status: Abnormal   Collection Time: 04/17/21 11:50 AM  Result Value Ref Range   Specific Gravity, UA 1.020 1.005 - 1.030   pH, UA 7.0 5.0 - 7.5   Color, UA Yellow Yellow   Appearance Ur Clear Clear   Leukocytes,UA Trace (A) Negative   Protein,UA Negative Negative/Trace   Glucose, UA Negative Negative   Ketones, UA Negative Negative   RBC, UA Negative Negative   Bilirubin, UA Negative Negative   Urobilinogen, Ur 0.2 0.2 - 1.0 mg/dL   Nitrite, UA Negative Negative   Microscopic Examination See below:   Microscopic Examination     Status: Abnormal   Collection Time: 04/17/21 11:50 AM   Urine  Result Value Ref Range   WBC, UA 0-5 0 - 5 /hpf   RBC None seen 0 - 2 /hpf   Epithelial Cells (non renal) 0-10 0 - 10 /hpf   Renal Epithel, UA None seen None seen /hpf   Bacteria, UA Few (A) None seen/Few   RIGHT UPPER QUADRANT SONOGRAM   INDICATION: Exam prior to bariatric surgery, morbid obesity.   COMPARISON: None   TECHNIQUE: Real-time grayscale sonographic evaluation of the right  upper quadrant.   FINDINGS:   Liver: Right lobe of the liver measures 20.5 cm. Increased  echogenicity is demonstrated throughout the liver suggestive of fatty  infiltration. No focal mass or intrahepatic biliary duct dilatation.   Gallbladder and biliary tree: There is a solitary gallstone measuring  3.1 x 2.3 x 1.3 cm. No focal tenderness observed over the right upper  quadrant. Common  duct diameter 3 mm.   Right Kidney: 10.7 cm in length. Normal echotexture and parenchymal  thickness. No hydronephrosis, solid mass or nephrolithiasis.   Pancreas: Pancreas not well seen but visible portions of the head and  body are normal. The tail is obscured by intestinal gas. No ductal  dilatation.   IVC: Normal IVC.   No free fluid.   IMPRESSION: 1. Cholelithiasis, negative sonographic Murphy's sign, and  no biliary ductal dilatation.  2.  Hepatomegaly and hepatic steatosis. Procedure Note  Detweiler, Lorriane Shire, MD - 03/28/2020  Formatting of this note might be different from the original.  RIGHT UPPER QUADRANT SONOGRAM   INDICATION: Exam prior to bariatric surgery, morbid obesity.   COMPARISON: None   TECHNIQUE: Real-time grayscale sonographic evaluation of the right  upper quadrant.   FINDINGS:   Liver: Right lobe of the liver measures 20.5 cm. Increased  echogenicity is demonstrated throughout the liver suggestive of fatty  infiltration. No focal mass or intrahepatic biliary duct dilatation.   Gallbladder and biliary tree: There is a solitary gallstone measuring  3.1 x 2.3 x 1.3 cm. No focal tenderness observed over the right upper  quadrant. Common duct diameter 3 mm.   Right Kidney: 10.7 cm in length. Normal echotexture and parenchymal  thickness. No hydronephrosis, solid mass or nephrolithiasis.   Pancreas: Pancreas not well seen but visible portions of the head and  body are normal. The tail is obscured by intestinal gas. No ductal  dilatation.   IVC: Normal IVC.   No free fluid.   IMPRESSION: 1. Cholelithiasis, negative sonographic Murphy's sign, and  no biliary ductal dilatation.  2.  Hepatomegaly and hepatic steatosis.   Assessment & Plan:  Samanatha Brammer is a 45 y.o. female with gallstones who is having pain.  -PLAN: I counseled the patient about the  indication, risks and benefits of laparoscopic cholecystectomy.  She understands there is a  very small chance for bleeding, infection, injury to normal structures (including common bile duct), conversion to open surgery, persistent symptoms, evolution of postcholecystectomy diarrhea, need for secondary interventions, anesthesia reaction, cardiopulmonary issues and other risks not specifically detailed here. I described the expected recovery, the plan for follow-up and the restrictions during the recovery phase.  All questions were answered.   All questions were answered to the satisfaction of the patient.     Virl Cagey 04/18/2021, 9:15 AM

## 2021-04-18 NOTE — Patient Instructions (Signed)
Cholelithiasis Cholelithiasis is a disease in which gallstones form in the gallbladder. The gallbladder is an organ that stores bile. Bile is a fluid that helps to digest fats. Gallstones begin as small crystals and can slowly grow into stones. They may cause no symptoms until they block the gallbladder duct, or cystic duct, when the gallbladder tightens (contracts) after food is eaten. This can cause pain and is known as a gallbladder attack, or biliary colic. There are two main types of gallstones: Cholesterol stones. These are the most common type of gallstone. These stones are made of hardened cholesterol and are usually yellow-green in color. Cholesterol is a fat-like substance that is made in the liver. Pigment stones. These are dark in color and are made of a red-yellow substance, called bilirubin,that forms when hemoglobin from red blood cells breaks down. What are the causes? This condition may be caused by an imbalance in the different parts that make bile. This can happen if the bile: Has too much bilirubin. This can happen in certain blood diseases, such as sickle cell anemia. Has too much cholesterol. Does not have enough bile salts. These salts help the body absorb and digest fats. In some cases, this condition can also be caused by the gallbladder not emptying completely or often enough. This is common during pregnancy. What increases the risk? The following factors may make you more likely to develop this condition: Being female. Having multiple pregnancies. Health care providers sometimes advise removing diseased gallbladders before future pregnancies. Eating a diet that is heavy in fried foods, fat, and refined carbohydrates, such as white bread and white rice. Being obese. Being older than age 56. Using medicines that contain female hormones (estrogen) for a long time. Losing weight quickly. Having a family history of gallstones. Having certain medical problems, such  as: Diabetes mellitus. Cystic fibrosis. Crohn's disease. Cirrhosis or other long-term (chronic) liver disease. Certain blood diseases, such as sickle cell anemia or leukemia. What are the signs or symptoms? In many cases, having gallstones causes no symptoms. When you have gallstones but do not have symptoms, you have silent gallstones. If a gallstone blocks your bile duct, it can cause a gallbladder attack. The main symptom of a gallbladder attack is sudden pain in the upper right part of the abdomen. The pain: Usually comes at night or after eating. Can last for one hour or more. Can spread to your right shoulder, back, or chest. Can feel like indigestion. This is discomfort, burning, or fullness in your upper abdomen. If the bile duct is blocked for more than a few hours, it can cause an infection or inflammation of your gallbladder (cholecystitis), liver, or pancreas. This can cause: Nausea or vomiting. Bloating. Pain in your abdomen that lasts for 5 hours or longer. Tenderness in your upper abdomen, often in the upper right section and under your rib cage. Fever or chills. Skin or the white parts of your eyes turning yellow (jaundice). This usually happens when a stone has blocked bile from passing through the common bile duct. Dark urine or light-colored stools. How is this diagnosed? This condition may be diagnosed based on: A physical exam. Your medical history. Ultrasound. CT scan. MRI. You may also have other tests, including: Blood tests to check for signs of an infection or inflammation. Cholescintigraphy, or HIDA scan. This is a scan of your gallbladder and bile ducts (biliary system) using non-harmful radioactive material and special cameras that can see the radioactive material. Endoscopic retrograde cholangiopancreatogram. This involves  inserting a small tube with a camera on the end (endoscope) through your mouth to look at bile ducts and check for blockages. How is  this treated? Treatment for this condition depends on the severity of the condition. Silent gallstones do not need treatment. Treatment may be needed if a blockage causes a gallbladder attack or other symptoms. Treatment may include: Home care, if symptoms are not severe. During a simple gallbladder attack, stop eating and drinking for 12-24 hours (except for water and clear liquids). This helps to "cool down" your gallbladder. After 1 or 2 days, you can start to eat a diet of simple or clear foods, such as broths and crackers. You may also need medicines for pain or nausea or both. If you have cholecystitis and an infection, you will need antibiotics. A hospital stay, if needed for pain control or for cholecystitis with severe infection. Cholecystectomy, or surgery to remove your gallbladder. This is the most common treatment if all other treatments have not worked. Medicines to break up gallstones. These are most effective at treating small gallstones. Medicines may be used for up to 6-12 months. Endoscopic retrograde cholangiopancreatogram. A small basket can be attached to the endoscope and used to capture and remove gallstones, mainly those that are in the common bile duct. Follow these instructions at home: Medicines Take over-the-counter and prescription medicines only as told by your health care provider. If you were prescribed an antibiotic medicine, take it as told by your health care provider. Do not stop taking the antibiotic even if you start to feel better. Ask your health care provider if the medicine prescribed to you requires you to avoid driving or using machinery. Eating and drinking Drink enough fluid to keep your urine pale yellow. This is important during a gallbladder attack. Water and clear liquids are preferred. Follow a healthy diet. This includes: Reducing fatty foods, such as fried food and foods high in cholesterol. Reducing refined carbohydrates, such as white bread  and white rice. Eating more fiber. Aim for foods such as almonds, fruit, and beans. Alcohol use If you drink alcohol: Limit how much you use to: 0-1 drink a day for nonpregnant women. 0-2 drinks a day for men. Be aware of how much alcohol is in your drink. In the [place] one drink equals one 12 oz bottle of beer (355 mL), one 5 oz glass of wine (148 mL), or one 1 oz glass of hard liquor (44 mL). General instructions Do not use any products that contain nicotine or tobacco, such as cigarettes, e-cigarettes, and chewing tobacco. If you need help quitting, ask your health care provider. Maintain a healthy weight. Keep all follow-up visits as told by your health care provider. These may include consultations with a surgeon or specialist. This is important. Where to find more information [employer] of Diabetes and Digestive and Kidney Diseases: [email or web address] Contact a health care provider if: You think you have had a gallbladder attack. You have been diagnosed with silent gallstones and you develop pain in your abdomen or indigestion. You begin to have attacks more often. You have dark urine or light-colored stools. Get help right away if: You have pain from a gallbladder attack that lasts for more than 2 hours. You have pain in your abdomen that lasts for more than 5 hours or is getting worse. You have a fever or chills. You have nausea and vomiting that do not go away. You develop jaundice. Summary Cholelithiasis is a disease in which gallstones  form in the gallbladder. This condition may be caused by an imbalance in the different parts that make bile. This can happen if your bile has too much bilirubin or cholesterol, or does not have enough bile salts. Treatment for gallstones depends on the severity of the condition. Silent gallstones do not need treatment. If gallstones cause a gallbladder attack or other symptoms, treatment usually involves not eating or drinking anything.  Treatment may also include pain medicines and antibiotics, and it sometimes includes a hospital stay. Surgery to remove the gallbladder is common if all other treatments have not worked. This information is not intended to replace advice given to you by your health care provider. Make sure you discuss any questions you have with your health care provider. Document Revised: 05/18/2019 Document Reviewed: 05/18/2019 Elsevier Patient Education  2022 [place].  Minimally Invasive Cholecystectomy Minimally invasive cholecystectomy is surgery to remove the gallbladder. The gallbladder is a pear-shaped organ that lies beneath the liver on the right side of the body. The gallbladder stores bile, which is a fluid that helps the body digest fats. Cholecystectomy is often done to treat inflammation of the gallbladder (cholecystitis). This condition is usually caused by a buildup of gallstones (cholelithiasis) in the gallbladder. Gallstones can block the flow of bile, which can result in inflammation and pain. In severe cases, emergency surgery may be required. This procedure is done though small incisions in the abdomen, instead of one large incision. It is also called laparoscopic surgery. A thin scope with a camera (laparoscope) is inserted through one incision. Then surgical instruments are inserted through the other incisions. In some cases, a minimally invasive surgery may need to be changed to a surgery that is done through a larger incision. This is called open surgery. Tell a health care provider about: Any allergies you have. All medicines you are taking, including vitamins, herbs, eye drops, creams, and over-the-counter medicines. Any problems you or family members have had with anesthetic medicines. Any blood disorders you have. Any surgeries you have had. Any medical conditions you have. Whether you are pregnant or may be pregnant. What are the risks? Generally, this is a safe procedure.  However, problems may occur, including: Infection. Bleeding. Allergic reactions to medicines. Damage to nearby structures or organs. A stone remaining in the common bile duct. The common bile duct carries bile from the gallbladder into the small intestine. A bile leak from the cyst duct that is clipped when your gallbladder is removed. What happens before the procedure? Medicines Ask your health care provider about: Changing or stopping your regular medicines. This is especially important if you are taking diabetes medicines or blood thinners. Taking medicines such as aspirin and ibuprofen. These medicines can thin your blood. Do not take these medicines unless your health care provider tells you to take them. Taking over-the-counter medicines, vitamins, herbs, and supplements. General instructions Let your health care provider know if you develop a cold or an infection before surgery. Plan to have someone take you home from the hospital or clinic. If you will be going home right after the procedure, plan to have someone with you for 24 hours. Ask your health care provider: How your surgery site will be marked. What steps will be taken to help prevent infection. These may include: Removing hair at the surgery site. Washing skin with a germ-killing soap. Taking antibiotic medicine. What happens during the procedure?  An IV will be inserted into one of your veins. You will be given  one or both of the following: A medicine to help you relax (sedative). A medicine to make you fall asleep (general anesthetic). A breathing tube will be placed in your mouth. Your surgeon will make several small incisions in your abdomen. The laparoscope will be inserted through one of the small incisions. The camera on the laparoscope will send images to a monitor in the operating room. This lets your surgeon see inside your abdomen. A gas will be pumped into your abdomen. This will expand your abdomen to  give the surgeon more room to perform the surgery. Other tools that are needed for the procedure will be inserted through the other incisions. The gallbladder will be removed through one of the incisions. Your common bile duct may be examined. If stones are found in the common bile duct, they may be removed. After your gallbladder has been removed, the incisions will be closed with stitches (sutures), staples, or skin glue. Your incisions may be covered with a bandage (dressing). The procedure may vary among health care providers and hospitals. What happens after the procedure? Your blood pressure, heart rate, breathing rate, and blood oxygen level will be monitored until you leave the hospital or clinic. You will be given medicines as needed to control your pain. If you were given a sedative during the procedure, it can affect you for several hours. Do not drive or operate machinery until your health care provider says that it is safe. Summary Minimally invasive cholecystectomy, also called laparoscopic cholecystectomy, is surgery to remove the gallbladder using small incisions. Tell your health care provider about all the medical conditions you have and all the medicines you are taking for those conditions. Before the procedure, follow instructions about eating or drinking restrictions and changing or stopping medicines. If you were given a sedative during the procedure, it can affect you for several hours. Do not drive or operate machinery until your health care provider says that it is safe. This information is not intended to replace advice given to you by your health care provider. Make sure you discuss any questions you have with your health care provider. Document Revised: 03/30/2019 Document Reviewed: 03/30/2019 Elsevier Patient Education  [place].

## 2021-04-20 LAB — CULTURE, URINE COMPREHENSIVE

## 2021-04-21 NOTE — H&P (Signed)
[facility] History and Physical  Reason for Referral: Gallstones Referring Physician: Sharion Balloon, FNP   Chief Complaint   New Patient (Initial Visit)     Carol Vargas is a 45 y.o. female.  HPI: Carol Vargas is a 25 who has known gallstones and associated abdominal pain, nausea especially with greasy foods. She has been having worsening pain and did try some ursodiol at some point. She says that this is not working any more. She has been having some loose stools. She is ready to have her gallbladder removed.    Past Medical History:  Diagnosis Date   Arthritis    Back pain    Bilateral swelling of feet    Fibroids    Gallstone 2021   Headache(784.0)    at times, nothing severe per pt.menstral   High cholesterol    Hypertension    Joint pain    Sjogrens syndrome ([place])    Vitamin D deficiency     Past Surgical History:  Procedure Laterality Date   ABDOMINAL HYSTERECTOMY     BACK SURGERY     L4 L5 fusion and instrumentation Jul 06 2020    BILATERAL SALPINGECTOMY Right 01/29/2017   Procedure: RIGHT SALPINGECTOMY;  Surgeon: Jonnie Kind, MD;  Location: [facility];  Service: Gynecology;  Laterality: Right;   BREAST REDUCTION SURGERY Bilateral 05/12/2019   Procedure: BILATERAL MAMMARY REDUCTION  (BREAST);  Surgeon: [name], MD;  Location: [place];  Service: Plastics;  Laterality: Bilateral;  3.5 hours, please   CESAREAN SECTION     DILATION AND CURETTAGE OF UTERUS     EXTERNAL FIXATION LEG Right 07/05/2013   Procedure: EXTERNAL FIXATION LEG;  Surgeon: Marianna Payment, MD;  Location: [place];  Service: Orthopedics;  Laterality: Right;   EXTERNAL FIXATION LEG Right 07/07/2013   Procedure: Ex-Fix Revision Right Tibial Plateau;  Surgeon: Rozanna Box, MD;  Location: [place];  Service: Orthopedics;  Laterality: Right;   EXTERNAL FIXATION REMOVAL Right 07/28/2013   Procedure: REMOVAL EXTERNAL FIXATION LEG;  Surgeon: Rozanna Box, MD;   Location: [place];  Service: Orthopedics;  Laterality: Right;   HARDWARE REMOVAL Right 08/02/2014   Procedure: HARDWARE REMOVAL;  Surgeon: Vickey Huger, MD;  Location: [place];  Service: Orthopedics;  Laterality: Right;   KNEE ARTHROSCOPY Right 08/02/2014   Procedure: ARTHROSCOPY KNEE;  Surgeon: Vickey Huger, MD;  Location: [place];  Service: Orthopedics;  Laterality: Right;   ORIF TIBIA PLATEAU Right 07/28/2013   Procedure: OPEN REDUCTION INTERNAL FIXATION (ORIF) RIGHT TIBIAL PLATEAU;  Surgeon: Rozanna Box, MD;  Location: [place];  Service: Orthopedics;  Laterality: Right;   SUPRACERVICAL ABDOMINAL HYSTERECTOMY N/A 01/29/2017   Procedure: HYSTERECTOMY SUPRACERVICAL ABDOMINAL;  Surgeon: Jonnie Kind, MD;  Location: [facility];  Service: Gynecology;  Laterality: N/A;   TONSILLECTOMY  1992   TOTAL KNEE ARTHROPLASTY Right 01/03/2015   Procedure: TOTAL KNEE ARTHROPLASTY;  Surgeon: Vickey Huger, MD;  Location: [place];  Service: Orthopedics;  Laterality: Right;  former tibial plateau fracture orif with hardware removed 07/2014   TOTAL KNEE REVISION Right 11/08/2020   Procedure: RIGHT TOTAL KNEE REVISION;  Surgeon: Paralee Cancel, MD;  Location: [facility];  Service: Orthopedics;  Laterality: Right;    Family History  Problem Relation Age of Onset   Hypertension Mother    Diabetes Mellitus II Mother    Osteoporosis Mother    Fibroids Mother    Diabetes Mother    Hyperlipidemia Mother  Obesity Mother    Stroke Father        died from this   Hypertension Father    Lung cancer Maternal Aunt    Fibroids Sister     Social History   Tobacco Use   Smoking status: Former    Packs/day: 0.50    Years: 5.00    Pack years: 2.50    Types: Cigarettes    Quit date: 02/06/2013    Years since quitting: 8.2   Smokeless tobacco: Never  Vaping Use   Vaping Use: Never used  Substance Use Topics   Alcohol use: No   Drug use: No    Medications: I have reviewed the patient's current medications. Allergies as of  04/18/2021       Reactions   Lisinopril Cough        Medication List        Accurate as of April 18, 2021  9:15 AM. If you have any questions, ask your nurse or doctor.          amLODipine 10 MG tablet Commonly known as: NORVASC Take 1 tablet (10 mg total) by mouth daily.   diclofenac 75 MG EC tablet Commonly known as: VOLTAREN Take 1 tablet (75 mg total) by mouth 2 (two) times daily.   losartan-hydrochlorothiazide 100-25 MG tablet Commonly known as: HYZAAR Take 1 tablet by mouth daily.   multivitamin with minerals tablet Take 1 tablet by mouth daily.   sulfamethoxazole-trimethoprim 800-160 MG tablet Commonly known as: Bactrim DS Take 1 tablet by mouth 2 (two) times daily.   Ursodiol 200 MG Caps Take 200 mg by mouth 2 (two) times daily.         ROS:  A comprehensive review of systems was negative except for: Gastrointestinal: positive for abdominal pain, nausea, and reflux symptoms Musculoskeletal: positive for back pain, neck pain, and neck pain  Blood pressure 126/84, pulse 78, temperature 98.6 F (37 C), temperature source Oral, resp. rate 14, height 5' 7.5" (1.715 m), weight 271 lb (122.9 kg), last menstrual period 01/06/2017, SpO2 98 %. Physical Exam Vitals reviewed.  Constitutional:      Appearance: Normal appearance.  HENT:     Head: Normocephalic.     Nose: Nose normal.     Mouth/Throat:     Mouth: Mucous membranes are moist.  Eyes:     Extraocular Movements: Extraocular movements intact.  Cardiovascular:     Rate and Rhythm: Normal rate and regular rhythm.  Pulmonary:     Effort: Pulmonary effort is normal.     Breath sounds: Normal breath sounds.  Abdominal:     General: There is no distension.     Palpations: Abdomen is soft.     Tenderness: There is abdominal tenderness in the right upper quadrant and epigastric area.  Musculoskeletal:        General: Normal range of motion.     Cervical back: Normal range of motion.  Skin:     General: Skin is warm.  Neurological:     General: No focal deficit present.     Mental Status: She is alert and oriented to person, place, and time.  Psychiatric:        Mood and Affect: Mood normal.        Behavior: Behavior normal.        Thought Content: Thought content normal.        Judgment: Judgment normal.    Results: Results for orders placed or performed in visit on  04/17/21 (from the past 48 hour(s))  Urinalysis, Routine w reflex microscopic     Status: Abnormal   Collection Time: 04/17/21 11:50 AM  Result Value Ref Range   Specific Gravity, UA 1.020 1.005 - 1.030   pH, UA 7.0 5.0 - 7.5   Color, UA Yellow Yellow   Appearance Ur Clear Clear   Leukocytes,UA Trace (A) Negative   Protein,UA Negative Negative/Trace   Glucose, UA Negative Negative   Ketones, UA Negative Negative   RBC, UA Negative Negative   Bilirubin, UA Negative Negative   Urobilinogen, Ur 0.2 0.2 - 1.0 mg/dL   Nitrite, UA Negative Negative   Microscopic Examination See below:   Microscopic Examination     Status: Abnormal   Collection Time: 04/17/21 11:50 AM   Urine  Result Value Ref Range   WBC, UA 0-5 0 - 5 /hpf   RBC None seen 0 - 2 /hpf   Epithelial Cells (non renal) 0-10 0 - 10 /hpf   Renal Epithel, UA None seen None seen /hpf   Bacteria, UA Few (A) None seen/Few   RIGHT UPPER QUADRANT SONOGRAM   INDICATION: Exam prior to bariatric surgery, morbid obesity.   COMPARISON: None   TECHNIQUE: Real-time grayscale sonographic evaluation of the right  upper quadrant.   FINDINGS:   Liver: Right lobe of the liver measures 20.5 cm. Increased  echogenicity is demonstrated throughout the liver suggestive of fatty  infiltration. No focal mass or intrahepatic biliary duct dilatation.   Gallbladder and biliary tree: There is a solitary gallstone measuring  3.1 x 2.3 x 1.3 cm. No focal tenderness observed over the right upper  quadrant. Common duct diameter 3 mm.   Right Kidney: 10.7 cm in  length. Normal echotexture and parenchymal  thickness. No hydronephrosis, solid mass or nephrolithiasis.   Pancreas: Pancreas not well seen but visible portions of the head and  body are normal. The tail is obscured by intestinal gas. No ductal  dilatation.   IVC: Normal IVC.   No free fluid.   IMPRESSION: 1. Cholelithiasis, negative sonographic Murphy's sign, and  no biliary ductal dilatation.  2.  Hepatomegaly and hepatic steatosis. Procedure Note  Detweiler, Lorriane Shire, MD - 03/28/2020  Formatting of this note might be different from the original.  RIGHT UPPER QUADRANT SONOGRAM   INDICATION: Exam prior to bariatric surgery, morbid obesity.   COMPARISON: None   TECHNIQUE: Real-time grayscale sonographic evaluation of the right  upper quadrant.   FINDINGS:   Liver: Right lobe of the liver measures 20.5 cm. Increased  echogenicity is demonstrated throughout the liver suggestive of fatty  infiltration. No focal mass or intrahepatic biliary duct dilatation.   Gallbladder and biliary tree: There is a solitary gallstone measuring  3.1 x 2.3 x 1.3 cm. No focal tenderness observed over the right upper  quadrant. Common duct diameter 3 mm.   Right Kidney: 10.7 cm in length. Normal echotexture and parenchymal  thickness. No hydronephrosis, solid mass or nephrolithiasis.   Pancreas: Pancreas not well seen but visible portions of the head and  body are normal. The tail is obscured by intestinal gas. No ductal  dilatation.   IVC: Normal IVC.   No free fluid.   IMPRESSION: 1. Cholelithiasis, negative sonographic Murphy's sign, and  no biliary ductal dilatation.  2.  Hepatomegaly and hepatic steatosis.   Assessment & Plan:  Carol Vargas is a 45 y.o. female with gallstones who is having pain.  -PLAN: I counseled the patient about the  indication, risks and benefits of laparoscopic cholecystectomy.  She understands there is a very small chance for bleeding, infection, injury  to normal structures (including common bile duct), conversion to open surgery, persistent symptoms, evolution of postcholecystectomy diarrhea, need for secondary interventions, anesthesia reaction, cardiopulmonary issues and other risks not specifically detailed here. I described the expected recovery, the plan for follow-up and the restrictions during the recovery phase.  All questions were answered.   All questions were answered to the satisfaction of the patient.     Virl Cagey 04/18/2021, 9:15 AM

## 2021-04-27 ENCOUNTER — Ambulatory Visit (INDEPENDENT_AMBULATORY_CARE_PROVIDER_SITE_OTHER): Payer: BC Managed Care – PPO | Admitting: Family

## 2021-04-27 ENCOUNTER — Other Ambulatory Visit: Payer: Self-pay

## 2021-04-27 ENCOUNTER — Encounter: Payer: Self-pay | Admitting: Family

## 2021-04-27 VITALS — BP 131/85 | HR 75 | Temp 97.1°F | Ht 67.5 in | Wt 270.4 lb

## 2021-04-27 DIAGNOSIS — M159 Polyosteoarthritis, unspecified: Secondary | ICD-10-CM

## 2021-04-27 DIAGNOSIS — Z23 Encounter for immunization: Secondary | ICD-10-CM

## 2021-04-27 DIAGNOSIS — Z96651 Presence of right artificial knee joint: Secondary | ICD-10-CM

## 2021-04-27 DIAGNOSIS — I10 Essential (primary) hypertension: Secondary | ICD-10-CM | POA: Diagnosis not present

## 2021-04-27 DIAGNOSIS — Z0000 Encounter for general adult medical examination without abnormal findings: Secondary | ICD-10-CM | POA: Diagnosis not present

## 2021-04-27 DIAGNOSIS — Z0001 Encounter for general adult medical examination with abnormal findings: Secondary | ICD-10-CM

## 2021-04-27 DIAGNOSIS — Z713 Dietary counseling and surveillance: Secondary | ICD-10-CM

## 2021-04-27 MED ORDER — PHENTERMINE HCL 37.5 MG PO TABS: 37.5000 mg | ORAL_TABLET | Freq: Every day | ORAL | 0 refills | Status: DC

## 2021-04-27 MED ORDER — LOSARTAN POTASSIUM-HCTZ 100-25 MG PO TABS: 1.0000 | ORAL_TABLET | Freq: Every day | ORAL | 3 refills | Status: DC

## 2021-04-27 MED ORDER — AMLODIPINE BESYLATE 10 MG PO TABS: 10.0000 mg | ORAL_TABLET | Freq: Every day | ORAL | 2 refills | Status: DC

## 2021-04-27 MED ORDER — DICLOFENAC SODIUM 75 MG PO TBEC: 75.0000 mg | DELAYED_RELEASE_TABLET | Freq: Two times a day (BID) | ORAL | 2 refills | Status: DC

## 2021-04-27 NOTE — Patient Instructions (Signed)

## 2021-04-27 NOTE — Progress Notes (Signed)
Subjective:    Patient ID: Carol Vargas, female    DOB: 06-24-1976, 45 y.o.   MRN: [id number]  Chief Complaint  Patient presents with   Annual Exam   PT presents to the office today for CPE without pap. She had right knee revision on 11/08/20. She is scheduled for cholecystectomy on 05/05/21.  She is also requesting a prescription of phentermine today. She states she has taken this in the past and she was able to lose weight.  Hypertension This is a chronic problem. The current episode started more than 1 year ago. The problem has been resolved since onset. The problem is controlled. Pertinent negatives include no malaise/fatigue, peripheral edema or shortness of breath. Risk factors for coronary artery disease include dyslipidemia and obesity. The current treatment provides moderate improvement.  Arthritis Presents for follow-up visit. She complains of pain and stiffness. The symptoms have been stable. Affected locations include the left knee and right knee. Her pain is at a severity of 0/10.     Review of Systems  Constitutional:  Negative for malaise/fatigue.  Respiratory:  Negative for shortness of breath.   Musculoskeletal:  Positive for arthritis and stiffness.  All other systems reviewed and are negative.  Family History  Problem Relation Age of Onset   Hypertension Mother    Diabetes Mellitus II Mother    Osteoporosis Mother    Fibroids Mother    Diabetes Mother    Hyperlipidemia Mother    Obesity Mother    Stroke Father        died from this   Hypertension Father    Lung cancer Maternal Aunt    Fibroids Sister    Social History   Socioeconomic History   Marital status: Legally Separated    Spouse name: Not on file   Number of children: Not on file   Years of education: Not on file   Highest education level: Not on file  Occupational History   Not on file  Tobacco Use   Smoking status: Former    Packs/day: 0.50    Years: 5.00    Pack years: 2.50     Types: Cigarettes    Quit date: 02/06/2013    Years since quitting: 8.2   Smokeless tobacco: Never  Vaping Use   Vaping Use: Never used  Substance and Sexual Activity   Alcohol use: No   Drug use: No   Sexual activity: Not Currently    Birth control/protection: Surgical    Comment: hyst  Other Topics Concern   Not on file  Social History Narrative   Not on file   Social Determinants of Health   Financial Resource Strain: Not on file  Food Insecurity: Not on file  Transportation Needs: Not on file  Physical Activity: Not on file  Stress: Not on file  Social Connections: Not on file       Objective:   Physical Exam Vitals reviewed.  Constitutional:      General: She is not in acute distress.    Appearance: She is well-developed. She is obese.  HENT:     Head: Normocephalic and atraumatic.     Right Ear: Tympanic membrane normal.     Left Ear: Tympanic membrane normal.  Eyes:     Pupils: Pupils are equal, round, and reactive to light.  Neck:     Thyroid: No thyromegaly.  Cardiovascular:     Rate and Rhythm: Normal rate and regular rhythm.     Heart sounds:  Normal heart sounds. No murmur heard. Pulmonary:     Effort: Pulmonary effort is normal. No respiratory distress.     Breath sounds: Normal breath sounds. No wheezing.  Abdominal:     General: Bowel sounds are normal. There is no distension.     Palpations: Abdomen is soft.     Tenderness: There is no abdominal tenderness.  Musculoskeletal:        General: No tenderness. Normal range of motion.     Cervical back: Normal range of motion and neck supple.  Skin:    General: Skin is warm and dry.  Neurological:     Mental Status: She is alert and oriented to person, place, and time.     Cranial Nerves: No cranial nerve deficit.     Deep Tendon Reflexes: Reflexes are normal and symmetric.  Psychiatric:        Behavior: Behavior normal.        Thought Content: Thought content normal.        Judgment: Judgment  normal.        BP 131/85   Pulse 75   Temp (!) 97.1 F (36.2 C) (Temporal)   Ht 5' 7.5" (1.715 m)   Wt 270 lb 6.4 oz (122.7 kg)   LMP 01/06/2017 (Exact Date)   BMI 41.73 kg/m   Assessment & Plan:  Carol Vargas comes in today with chief complaint of Annual Exam   Diagnosis and orders addressed:  1. Osteoarthritis of multiple joints, unspecified osteoarthritis type  - diclofenac (VOLTAREN) 75 MG EC tablet; Take 1 tablet (75 mg total) by mouth 2 (two) times daily.  Dispense: 60 tablet; Refill: 2 - CMP14+EGFR - CBC with Differential/Platelet  2. Need for immunization against influenza  - Flu Vaccine QUAD 40moIM (Fluarix, Fluzone & Alfiuria Quad PF) - CMP14+EGFR - CBC with Differential/Platelet  3. Annual physical exam - Hepatitis C antibody - CMP14+EGFR - CBC with Differential/Platelet - Lipid panel - TSH  4. Primary hypertension - CMP14+EGFR - CBC with Differential/Platelet - amLODipine (NORVASC) 10 MG tablet; Take 1 tablet (10 mg total) by mouth daily.  Dispense: 90 tablet; Refill: 2 - losartan-hydrochlorothiazide (HYZAAR) 100-25 MG tablet; Take 1 tablet by mouth daily.  Dispense: 90 tablet; Refill: 3  5. Morbid obesity ([place] - CMP14+EGFR - CBC with Differential/Platelet - phentermine (ADIPEX-P) 37.5 MG tablet; Take 1 tablet (37.5 mg total) by mouth daily before breakfast.  Dispense: 90 tablet; Refill: 0  6. Status post total right knee replacement - CMP14+EGFR - CBC with Differential/Platelet  7. Weight loss counseling, encounter for -Will given phentermine today Encourage healthy diet and exercise - phentermine (ADIPEX-P) 37.5 MG tablet; Take 1 tablet (37.5 mg total) by mouth daily before breakfast.  Dispense: 90 tablet; Refill: 0   Labs pending Health Maintenance reviewed Diet and exercise encouraged  Follow up plan: 3 months    CEvelina Dun FNP

## 2021-04-28 LAB — CBC WITH DIFFERENTIAL/PLATELET
Basophils Absolute: 0.1 10*3/uL (ref 0.0–0.2)
Basos: 1 %
EOS (ABSOLUTE): 0.1 10*3/uL (ref 0.0–0.4)
Eos: 2 %
Hematocrit: 39.8 % (ref 34.0–46.6)
Hemoglobin: 13.5 g/dL (ref 11.1–15.9)
Immature Grans (Abs): 0 10*3/uL (ref 0.0–0.1)
Immature Granulocytes: 1 %
Lymphocytes Absolute: 1.8 10*3/uL (ref 0.7–3.1)
Lymphs: 34 %
MCH: 30.3 pg (ref 26.6–33.0)
MCHC: 33.9 g/dL (ref 31.5–35.7)
MCV: 89 fL (ref 79–97)
Monocytes Absolute: 0.3 10*3/uL (ref 0.1–0.9)
Monocytes: 6 %
Neutrophils Absolute: 3.1 10*3/uL (ref 1.4–7.0)
Neutrophils: 56 %
Platelets: 247 10*3/uL (ref 150–450)
RBC: 4.45 x10E6/uL (ref 3.77–5.28)
RDW: 12.8 % (ref 11.7–15.4)
WBC: 5.4 10*3/uL (ref 3.4–10.8)

## 2021-04-28 LAB — CMP14+EGFR
ALT: 20 IU/L (ref 0–32)
AST: 22 IU/L (ref 0–40)
Albumin/Globulin Ratio: 1.5 (ref 1.2–2.2)
Albumin: 4.4 g/dL (ref 3.8–4.8)
Alkaline Phosphatase: 115 IU/L (ref 44–121)
BUN/Creatinine Ratio: 12 (ref 9–23)
BUN: 10 mg/dL (ref 6–24)
Bilirubin Total: <0.2 mg/dL (ref 0.0–1.2)
CO2: 27 mmol/L (ref 20–29)
Calcium: 9.1 mg/dL (ref 8.7–10.2)
Chloride: 103 mmol/L (ref 96–106)
Creatinine, Ser: 0.83 mg/dL (ref 0.57–1.00)
Globulin, Total: 2.9 g/dL (ref 1.5–4.5)
Glucose: 99 mg/dL (ref 70–99)
Potassium: 3.8 mmol/L (ref 3.5–5.2)
Sodium: 144 mmol/L (ref 134–144)
Total Protein: 7.3 g/dL (ref 6.0–8.5)
eGFR: 87 mL/min/{1.73_m2} (ref >59)

## 2021-04-28 LAB — LIPID PANEL
Chol/HDL Ratio: 5.3 ratio — ABNORMAL HIGH (ref 0.0–4.4)
Cholesterol, Total: 249 mg/dL — ABNORMAL HIGH (ref 100–199)
HDL: 47 mg/dL (ref >39)
LDL Chol Calc (NIH): 177 mg/dL — ABNORMAL HIGH (ref 0–99)
Triglycerides: 137 mg/dL (ref 0–149)
VLDL Cholesterol Cal: 25 mg/dL (ref 5–40)

## 2021-04-28 LAB — TSH: TSH: 0.777 u[IU]/mL (ref 0.450–4.500)

## 2021-04-28 LAB — HEPATITIS C ANTIBODY: Hep C Virus Ab: <0.1 s/co ratio (ref 0.0–0.9)

## 2021-05-01 ENCOUNTER — Telehealth: Payer: Self-pay | Admitting: Family

## 2021-05-01 NOTE — Telephone Encounter (Signed)
Key: [id number] - Rx #: [id number] Need help? Call us at [phone] Outcome Additional Information Required Submit Bill To Other Processor Or Primary Payer Drug Diclofenac Sodium 75MG  dr tablets Form [facility] Electronic [place] Form [phone] NCPDP)   Pharmacy aware.

## 2021-05-02 NOTE — Patient Instructions (Signed)
Carol Vargas  05/02/2021     @PREFPERIOPPHARMACY @   Your procedure is scheduled on 05/05/2021.   Report to [facility] at  0800 A.M.   Call this number if you have problems the morning of surgery:  [phone]   Remember:  Do not eat or drink after midnight.    DO NOT take phenteremine 7 days prior to your procedure.      Take these medicines the morning of surgery with A SIP OF WATER             amlodipine, diclofenac.    Do not wear jewelry, make-up or nail polish.  Do not wear lotions, powders, or perfumes, or deodorant.  Do not shave 48 hours prior to surgery.  Men may shave face and neck.  Do not bring valuables to the hospital.  [facility] is not responsible for any belongings or valuables.  Contacts, dentures or bridgework may not be worn into surgery.  Leave your suitcase in the car.  After surgery it may be brought to your room.  For patients admitted to the hospital, discharge time will be determined by your treatment team.  Patients discharged the day of surgery will not be allowed to drive home and must have someone with them for 24 hours.    Special instructions:  DO NOT smoke tobacco or vape for 24 hours before your procedure.  Please read over the following fact sheets that you were given. Coughing and Deep Breathing, Surgical Site Infection Prevention, Anesthesia Post-op Instructions, and Care and Recovery After Surgery      Minimally Invasive Cholecystectomy, Care After This sheet gives you information about how to care for yourself after your procedure. Your health care provider may also give you more specific instructions. If you have problems or questions, contact your health care provider. What can I expect after the procedure? After the procedure, it is common to have: Pain at your incision sites. You will be given medicines to control this pain. Mild nausea or vomiting. Bloating and possible shoulder pain from the gas that was  used during the procedure. Follow these instructions at home: Medicines Take over-the-counter and prescription medicines only as told by your health care provider. If you were prescribed an antibiotic medicine, take or use it as told by your health care provider. Do not stop using the antibiotic even if you start to feel better. Ask your health care provider if the medicine prescribed to you: Requires you to avoid driving or using machinery. Can cause constipation. You may need to take these actions to prevent or treat constipation: Drink enough fluid to keep your urine pale yellow. Take over-the-counter or prescription medicines. Eat foods that are high in fiber, such as beans, whole grains, and fresh fruits and vegetables. Limit foods that are high in fat and processed sugars, such as fried or sweet foods. Incision care  Follow instructions from your health care provider about how to take care of your incisions. Make sure you: Wash your hands with soap and water for at least 20 seconds before and after you change your bandage (dressing). If soap and water are not available, use hand sanitizer. Change your dressing as told by your health care provider. Leave stitches (sutures), skin glue, or adhesive strips in place. These skin closures may need to be in place for 2 weeks or longer. If adhesive strip edges start to loosen and curl up, you may trim the loose  edges. Do not remove adhesive strips completely unless your health care provider tells you to do that. Do not take baths, swim, or use a hot tub until your health care provider approves. Ask your health care provider if you may take showers. You may only be allowed to take sponge baths. Check your incision area every day for signs of infection. Check for: More redness, swelling, or pain. Fluid or blood. Warmth. Pus or a bad smell. Activity Rest as told by your health care provider. Avoid sitting for a long time without moving. Get up to  take short walks every 1-2 hours. This is important to improve blood flow and breathing. Ask for help if you feel weak or unsteady. Do not lift anything that is heavier than 10 lb (4.5 kg), or the limit that you are told, until your health care provider says that it is safe. Do not play contact sports until your health care provider approves. Do not return to work or school until your health care provider approves. Return to your normal activities as told by your health care provider. Ask your health care provider what activities are safe for you. General instructions If you were given a sedative during the procedure, it can affect you for several hours. Do not drive or operate machinery until your health care provider says that it is safe. Keep all follow-up visits as told by your health care provider. This is important. Contact a health care provider if: You develop a rash. You have more redness, swelling, or pain around your incisions. You have fluid or blood coming from your incisions. Your incisions feel warm to the touch. You have pus or a bad smell coming from your incisions. You have a fever. One or more of your incisions breaks open. Get help right away if: You have trouble breathing. You have chest pain. You have increasing pain in your shoulders. You faint or feel dizzy when you stand. You have severe pain in your abdomen. You have nausea or vomiting that lasts for more than one day. You have leg pain. Summary After your procedure, it is common to have pain at the incision sites. You may also have nausea or bloating. Follow your health care provider's instructions about medicine, activity restrictions, and caring for your incision areas. Do not do activities that require a lot of effort. Contact a health care provider if you have a fever or other signs of infection, such as more redness, swelling, or pain around the incisions. Get help right away if you have chest pain,  increasing pain in the shoulders, or trouble breathing. This information is not intended to replace advice given to you by your health care provider. Make sure you discuss any questions you have with your health care provider. Document Revised: 03/25/2019 Document Reviewed: 03/25/2019 Elsevier Patient Education  [place] Anesthesia, Adult, Care After This sheet gives you information about how to care for yourself after your procedure. Your health care provider may also give you more specific instructions. If you have problems or questions, contact your health care provider. What can I expect after the procedure? After the procedure, the following side effects are common: Pain or discomfort at the IV site. Nausea. Vomiting. Sore throat. Trouble concentrating. Feeling cold or chills. Feeling weak or tired. Sleepiness and fatigue. Soreness and body aches. These side effects can affect parts of the body that were not involved in surgery. Follow these instructions at home: For the time period you were  told by your health care provider:  Rest. Do not participate in activities where you could fall or become injured. Do not drive or use machinery. Do not drink alcohol. Do not take sleeping pills or medicines that cause drowsiness. Do not make important decisions or sign legal documents. Do not take care of children on your own. Eating and drinking Follow any instructions from your health care provider about eating or drinking restrictions. When you feel hungry, start by eating small amounts of foods that are soft and easy to digest (bland), such as toast. Gradually return to your regular diet. Drink enough fluid to keep your urine pale yellow. If you vomit, rehydrate by drinking water, juice, or clear broth. General instructions If you have sleep apnea, surgery and certain medicines can increase your risk for breathing problems. Follow instructions from your health care  provider about wearing your sleep device: Anytime you are sleeping, including during daytime naps. While taking prescription pain medicines, sleeping medicines, or medicines that make you drowsy. Have a responsible adult stay with you for the time you are told. It is important to have someone help care for you until you are awake and alert. Return to your normal activities as told by your health care provider. Ask your health care provider what activities are safe for you. Take over-the-counter and prescription medicines only as told by your health care provider. If you smoke, do not smoke without supervision. Keep all follow-up visits as told by your health care provider. This is important. Contact a health care provider if: You have nausea or vomiting that does not get better with medicine. You cannot eat or drink without vomiting. You have pain that does not get better with medicine. You are unable to pass urine. You develop a skin rash. You have a fever. You have redness around your IV site that gets worse. Get help right away if: You have difficulty breathing. You have chest pain. You have blood in your urine or stool, or you vomit blood. Summary After the procedure, it is common to have a sore throat or nausea. It is also common to feel tired. Have a responsible adult stay with you for the time you are told. It is important to have someone help care for you until you are awake and alert. When you feel hungry, start by eating small amounts of foods that are soft and easy to digest (bland), such as toast. Gradually return to your regular diet. Drink enough fluid to keep your urine pale yellow. Return to your normal activities as told by your health care provider. Ask your health care provider what activities are safe for you. This information is not intended to replace advice given to you by your health care provider. Make sure you discuss any questions you have with your health care  provider. Document Revised: 03/10/2020 Document Reviewed: 10/08/2019 Elsevier Patient Education  2022 [employer]. How to Use Chlorhexidine for Bathing Chlorhexidine gluconate (CHG) is a germ-killing (antiseptic) solution that is used to clean the skin. It can get rid of the bacteria that normally live on the skin and can keep them away for about 24 hours. To clean your skin with CHG, you may be given: A CHG solution to use in the shower or as part of a sponge bath. A prepackaged cloth that contains CHG. Cleaning your skin with CHG may help lower the risk for infection: While you are staying in the intensive care unit of the hospital. If you have  a vascular access, such as a central line, to provide short-term or long-term access to your veins. If you have a catheter to drain urine from your bladder. If you are on a ventilator. A ventilator is a machine that helps you breathe by moving air in and out of your lungs. After surgery. What are the risks? Risks of using CHG include: A skin reaction. Hearing loss, if CHG gets in your ears and you have a perforated eardrum. Eye injury, if CHG gets in your eyes and is not rinsed out. The CHG product catching fire. Make sure that you avoid smoking and flames after applying CHG to your skin. Do not use CHG: If you have a chlorhexidine allergy or have previously reacted to chlorhexidine. On babies younger than 58 months of age. How to use CHG solution Use CHG only as told by your health care provider, and follow the instructions on the label. Use the full amount of CHG as directed. Usually, this is one bottle. During a shower Follow these steps when using CHG solution during a shower (unless your health care provider gives you different instructions): Start the shower. Use your normal soap and shampoo to wash your face and hair. Turn off the shower or move out of the shower stream. Pour the CHG onto a clean washcloth. Do not use any type of brush  or rough-edged sponge. Starting at your neck, lather your body down to your toes. Make sure you follow these instructions: If you will be having surgery, pay special attention to the part of your body where you will be having surgery. Scrub this area for at least 1 minute. Do not use CHG on your head or face. If the solution gets into your ears or eyes, rinse them well with water. Avoid your genital area. Avoid any areas of skin that have broken skin, cuts, or scrapes. Scrub your back and under your arms. Make sure to wash skin folds. Let the lather sit on your skin for 1-2 minutes or as long as told by your health care provider. Thoroughly rinse your entire body in the shower. Make sure that all body creases and crevices are rinsed well. Dry off with a clean towel. Do not put any substances on your body afterward--such as powder, lotion, or perfume--unless you are told to do so by your health care provider. Only use lotions that are recommended by the manufacturer. Put on clean clothes or pajamas. If it is the night before your surgery, sleep in clean sheets.  During a sponge bath Follow these steps when using CHG solution during a sponge bath (unless your health care provider gives you different instructions): Use your normal soap and shampoo to wash your face and hair. Pour the CHG onto a clean washcloth. Starting at your neck, lather your body down to your toes. Make sure you follow these instructions: If you will be having surgery, pay special attention to the part of your body where you will be having surgery. Scrub this area for at least 1 minute. Do not use CHG on your head or face. If the solution gets into your ears or eyes, rinse them well with water. Avoid your genital area. Avoid any areas of skin that have broken skin, cuts, or scrapes. Scrub your back and under your arms. Make sure to wash skin folds. Let the lather sit on your skin for 1-2 minutes or as long as told by your  health care provider. Using a different clean, wet  washcloth, thoroughly rinse your entire body. Make sure that all body creases and crevices are rinsed well. Dry off with a clean towel. Do not put any substances on your body afterward--such as powder, lotion, or perfume--unless you are told to do so by your health care provider. Only use lotions that are recommended by the manufacturer. Put on clean clothes or pajamas. If it is the night before your surgery, sleep in clean sheets. How to use CHG prepackaged cloths Only use CHG cloths as told by your health care provider, and follow the instructions on the label. Use the CHG cloth on clean, dry skin. Do not use the CHG cloth on your head or face unless your health care provider tells you to. When washing with the CHG cloth: Avoid your genital area. Avoid any areas of skin that have broken skin, cuts, or scrapes. Before surgery Follow these steps when using a CHG cloth to clean before surgery (unless your health care provider gives you different instructions): Using the CHG cloth, vigorously scrub the part of your body where you will be having surgery. Scrub using a back-and-forth motion for 3 minutes. The area on your body should be completely wet with CHG when you are done scrubbing. Do not rinse. Discard the cloth and let the area air-dry. Do not put any substances on the area afterward, such as powder, lotion, or perfume. Put on clean clothes or pajamas. If it is the night before your surgery, sleep in clean sheets.  For general bathing Follow these steps when using CHG cloths for general bathing (unless your health care provider gives you different instructions). Use a separate CHG cloth for each area of your body. Make sure you wash between any folds of skin and between your fingers and toes. Wash your body in the following order, switching to a new cloth after each step: The front of your neck, shoulders, and chest. Both of your arms,  under your arms, and your hands. Your stomach and groin area, avoiding the genitals. Your right leg and foot. Your left leg and foot. The back of your neck, your back, and your buttocks. Do not rinse. Discard the cloth and let the area air-dry. Do not put any substances on your body afterward--such as powder, lotion, or perfume--unless you are told to do so by your health care provider. Only use lotions that are recommended by the manufacturer. Put on clean clothes or pajamas. Contact a health care provider if: Your skin gets irritated after scrubbing. You have questions about using your solution or cloth. You swallow any chlorhexidine. Call your local poison control center (1-331-710-4047 in the [place] Get help right away if: Your eyes itch badly, or they become very red or swollen. Your skin itches badly and is red or swollen. Your hearing changes. You have trouble seeing. You have swelling or tingling in your mouth or throat. You have trouble breathing. These symptoms may represent a serious problem that is an emergency. Do not wait to see if the symptoms will go away. Get medical help right away. Call your local emergency services (911 in the [place] Do not drive yourself to the hospital. Summary Chlorhexidine gluconate (CHG) is a germ-killing (antiseptic) solution that is used to clean the skin. Cleaning your skin with CHG may help to lower your risk for infection. You may be given CHG to use for bathing. It may be in a bottle or in a prepackaged cloth to use on your skin. Carefully follow your health  care provider's instructions and the instructions on the product label. Do not use CHG if you have a chlorhexidine allergy. Contact your health care provider if your skin gets irritated after scrubbing. This information is not intended to replace advice given to you by your health care provider. Make sure you discuss any questions you have with your health care provider. Document Revised:  09/05/2020 Document Reviewed: 09/05/2020 Elsevier Patient Education  2022 [employer].

## 2021-05-03 ENCOUNTER — Encounter (HOSPITAL_COMMUNITY)
Admission: RE | Admit: 2021-05-03 | Discharge: 2021-05-03 | Disposition: A | Discharge status: Home / Self Care | Payer: BC Managed Care – PPO | Source: Ambulatory Visit | Attending: General Surgery | Admitting: General Surgery

## 2021-05-03 ENCOUNTER — Ambulatory Visit: Payer: BC Managed Care – PPO

## 2021-05-03 ENCOUNTER — Encounter (HOSPITAL_COMMUNITY): Payer: Self-pay

## 2021-05-04 ENCOUNTER — Ambulatory Visit: Payer: BC Managed Care – PPO

## 2021-05-04 ENCOUNTER — Other Ambulatory Visit: Payer: Self-pay

## 2021-05-05 DIAGNOSIS — K8020 Calculus of gallbladder without cholecystitis without obstruction: Secondary | ICD-10-CM | POA: Diagnosis present

## 2021-05-18 ENCOUNTER — Encounter (HOSPITAL_COMMUNITY)
Admission: RE | Admit: 2021-05-18 | Discharge: 2021-05-18 | Disposition: A | Discharge status: Home / Self Care | Payer: BC Managed Care – PPO | Source: Ambulatory Visit | Attending: General Surgery | Admitting: General Surgery

## 2021-05-18 ENCOUNTER — Other Ambulatory Visit: Payer: Self-pay

## 2021-05-24 ENCOUNTER — Ambulatory Visit (HOSPITAL_COMMUNITY)
Admission: RE | Admit: 2021-05-24 | Discharge: 2021-05-24 | Disposition: A | Discharge status: Home / Self Care | Payer: BC Managed Care – PPO | Attending: General Surgery | Admitting: General Surgery

## 2021-05-24 ENCOUNTER — Encounter (HOSPITAL_COMMUNITY): Payer: Self-pay | Admitting: General Surgery

## 2021-05-24 ENCOUNTER — Ambulatory Visit (HOSPITAL_COMMUNITY): Payer: BC Managed Care – PPO | Admitting: Certified Registered"

## 2021-05-24 ENCOUNTER — Encounter (HOSPITAL_COMMUNITY)
Admission: RE | Disposition: A | Discharge status: Home / Self Care | Payer: Self-pay | Source: Home / Self Care | Attending: General Surgery

## 2021-05-24 DIAGNOSIS — Z87891 Personal history of nicotine dependence: Secondary | ICD-10-CM | POA: Diagnosis not present

## 2021-05-24 DIAGNOSIS — N8003 Adenomyosis of the uterus: Secondary | ICD-10-CM | POA: Insufficient documentation

## 2021-05-24 DIAGNOSIS — K8010 Calculus of gallbladder with chronic cholecystitis without obstruction: Secondary | ICD-10-CM | POA: Insufficient documentation

## 2021-05-24 DIAGNOSIS — I10 Essential (primary) hypertension: Secondary | ICD-10-CM | POA: Diagnosis not present

## 2021-05-24 DIAGNOSIS — K828 Other specified diseases of gallbladder: Secondary | ICD-10-CM | POA: Diagnosis not present

## 2021-05-24 DIAGNOSIS — K8020 Calculus of gallbladder without cholecystitis without obstruction: Secondary | ICD-10-CM

## 2021-05-24 HISTORY — PX: CHOLECYSTECTOMY: SHX55

## 2021-05-24 SURGERY — LAPAROSCOPIC CHOLECYSTECTOMY
Anesthesia: General | Site: Abdomen

## 2021-05-24 MED ORDER — CHLORHEXIDINE GLUCONATE CLOTH 2 % EX PADS: 6.0000 | MEDICATED_PAD | Freq: Once | CUTANEOUS | Status: DC

## 2021-05-24 MED ORDER — PROMETHAZINE HCL 25 MG/ML IJ SOLN
INTRAMUSCULAR | Status: AC
  Filled 2021-05-24: qty 1

## 2021-05-24 MED ORDER — LIDOCAINE HCL (PF) 2 % IJ SOLN
INTRAMUSCULAR | Status: AC
  Filled 2021-05-24: qty 5

## 2021-05-24 MED ORDER — CHLORHEXIDINE GLUCONATE 0.12 % MT SOLN
15.0000 mL | Freq: Once | OROMUCOSAL | Status: AC
  Administered 2021-05-24: 15 mL via OROMUCOSAL

## 2021-05-24 MED ORDER — PROPOFOL 10 MG/ML IV BOLUS
INTRAVENOUS | Status: AC
  Filled 2021-05-24: qty 40

## 2021-05-24 MED ORDER — MIDAZOLAM HCL 5 MG/5ML IJ SOLN
INTRAMUSCULAR | Status: DC | PRN
  Administered 2021-05-24 (×2): 1 mg via INTRAVENOUS

## 2021-05-24 MED ORDER — PROPOFOL 10 MG/ML IV BOLUS
INTRAVENOUS | Status: DC | PRN
  Administered 2021-05-24: 200 mg via INTRAVENOUS

## 2021-05-24 MED ORDER — GLYCOPYRROLATE 0.2 MG/ML IJ SOLN
INTRAMUSCULAR | Status: DC | PRN
  Administered 2021-05-24: .1 mg via INTRAVENOUS

## 2021-05-24 MED ORDER — KETOROLAC TROMETHAMINE 30 MG/ML IJ SOLN
INTRAMUSCULAR | Status: DC | PRN
  Administered 2021-05-24: 30 mg via INTRAVENOUS

## 2021-05-24 MED ORDER — DEXAMETHASONE SODIUM PHOSPHATE 10 MG/ML IJ SOLN
INTRAMUSCULAR | Status: DC | PRN
Start: 2021-05-24 — End: 2021-05-24
  Administered 2021-05-24: 5 mg via INTRAVENOUS

## 2021-05-24 MED ORDER — ONDANSETRON HCL 4 MG/2ML IJ SOLN
4.0000 mg | Freq: Once | INTRAMUSCULAR | Status: DC | PRN
  Filled 2021-05-24: qty 2

## 2021-05-24 MED ORDER — ONDANSETRON HCL 4 MG PO TABS: 4.0000 mg | ORAL_TABLET | Freq: Three times a day (TID) | ORAL | 1 refills | Status: DC | PRN

## 2021-05-24 MED ORDER — HEMOSTATIC AGENTS (NO CHARGE) OPTIME
TOPICAL | Status: DC | PRN
  Administered 2021-05-24: 1

## 2021-05-24 MED ORDER — FENTANYL CITRATE (PF) 250 MCG/5ML IJ SOLN
INTRAMUSCULAR | Status: AC
  Filled 2021-05-24: qty 5

## 2021-05-24 MED ORDER — SODIUM CHLORIDE FLUSH 0.9 % IV SOLN
INTRAVENOUS | Status: AC
  Filled 2021-05-24: qty 10

## 2021-05-24 MED ORDER — OXYCODONE HCL 5 MG PO TABS
5.0000 mg | ORAL_TABLET | Freq: Once | ORAL | Status: AC
  Administered 2021-05-24: 5 mg via ORAL

## 2021-05-24 MED ORDER — FENTANYL CITRATE PF 50 MCG/ML IJ SOSY
25.0000 ug | PREFILLED_SYRINGE | INTRAMUSCULAR | Status: DC | PRN
  Administered 2021-05-24: 50 ug via INTRAVENOUS
  Filled 2021-05-24: qty 1

## 2021-05-24 MED ORDER — SODIUM CHLORIDE 0.9 % IV SOLN
2.0000 g | INTRAVENOUS | Status: AC
  Administered 2021-05-24: 2 g via INTRAVENOUS

## 2021-05-24 MED ORDER — SODIUM CHLORIDE 0.9 % IR SOLN
Status: DC | PRN
  Administered 2021-05-24: 1000 mL

## 2021-05-24 MED ORDER — LACTATED RINGERS IV SOLN: INTRAVENOUS | Status: DC

## 2021-05-24 MED ORDER — BUPIVACAINE HCL (PF) 0.5 % IJ SOLN
INTRAMUSCULAR | Status: DC | PRN
  Administered 2021-05-24: 10 mL

## 2021-05-24 MED ORDER — KETOROLAC TROMETHAMINE 30 MG/ML IJ SOLN
INTRAMUSCULAR | Status: AC
  Filled 2021-05-24: qty 1

## 2021-05-24 MED ORDER — ROCURONIUM BROMIDE 10 MG/ML (PF) SYRINGE
PREFILLED_SYRINGE | INTRAVENOUS | Status: AC
  Filled 2021-05-24: qty 10

## 2021-05-24 MED ORDER — SODIUM CHLORIDE 0.9 % IV SOLN
INTRAVENOUS | Status: AC
  Filled 2021-05-24: qty 2

## 2021-05-24 MED ORDER — DEXAMETHASONE SODIUM PHOSPHATE 10 MG/ML IJ SOLN
INTRAMUSCULAR | Status: AC
  Filled 2021-05-24: qty 1

## 2021-05-24 MED ORDER — FENTANYL CITRATE (PF) 100 MCG/2ML IJ SOLN
INTRAMUSCULAR | Status: DC | PRN
  Administered 2021-05-24: 50 ug via INTRAVENOUS
  Administered 2021-05-24: 100 ug via INTRAVENOUS
  Administered 2021-05-24: 50 ug via INTRAVENOUS

## 2021-05-24 MED ORDER — LIDOCAINE 2% (20 MG/ML) 5 ML SYRINGE
INTRAMUSCULAR | Status: DC | PRN
  Administered 2021-05-24: 100 mg via INTRAVENOUS

## 2021-05-24 MED ORDER — ONDANSETRON HCL 4 MG/2ML IJ SOLN
INTRAMUSCULAR | Status: AC
  Filled 2021-05-24: qty 2

## 2021-05-24 MED ORDER — SCOPOLAMINE 1 MG/3DAYS TD PT72
1.0000 | MEDICATED_PATCH | Freq: Once | TRANSDERMAL | Status: DC
  Administered 2021-05-24: 1.5 mg via TRANSDERMAL
  Filled 2021-05-24: qty 1

## 2021-05-24 MED ORDER — MIDAZOLAM HCL 2 MG/2ML IJ SOLN
INTRAMUSCULAR | Status: AC
  Filled 2021-05-24: qty 2

## 2021-05-24 MED ORDER — OXYCODONE HCL 5 MG PO TABS: 5.0000 mg | ORAL_TABLET | ORAL | 0 refills | Status: DC | PRN

## 2021-05-24 MED ORDER — PROMETHAZINE HCL 25 MG/ML IJ SOLN
12.5000 mg | Freq: Once | INTRAMUSCULAR | Status: AC
  Administered 2021-05-24: 12.5 mg via INTRAVENOUS

## 2021-05-24 MED ORDER — ROCURONIUM BROMIDE 10 MG/ML (PF) SYRINGE
PREFILLED_SYRINGE | INTRAVENOUS | Status: DC | PRN
  Administered 2021-05-24: 60 mg via INTRAVENOUS

## 2021-05-24 MED ORDER — ORAL CARE MOUTH RINSE: 15.0000 mL | Freq: Once | OROMUCOSAL | Status: AC

## 2021-05-24 MED ORDER — OXYCODONE HCL 5 MG PO TABS
ORAL_TABLET | ORAL | Status: AC
  Filled 2021-05-24: qty 1

## 2021-05-24 MED ORDER — ONDANSETRON HCL 4 MG/2ML IJ SOLN
INTRAMUSCULAR | Status: DC | PRN
  Administered 2021-05-24: 4 mg via INTRAVENOUS

## 2021-05-24 MED ORDER — KETOROLAC TROMETHAMINE 30 MG/ML IJ SOLN: 30.0000 mg | Freq: Once | INTRAMUSCULAR | Status: DC

## 2021-05-24 MED ORDER — SUGAMMADEX SODIUM 200 MG/2ML IV SOLN
INTRAVENOUS | Status: DC | PRN
  Administered 2021-05-24: 200 mg via INTRAVENOUS

## 2021-05-24 MED ORDER — BUPIVACAINE HCL (PF) 0.5 % IJ SOLN
INTRAMUSCULAR | Status: AC
  Filled 2021-05-24: qty 30

## 2021-05-24 SURGICAL SUPPLY — 41 items
ADH SKN CLS APL DERMABOND .7 (GAUZE/BANDAGES/DRESSINGS) ×1
APL PRP STRL LF DISP 70% ISPRP (MISCELLANEOUS) ×1
APPLIER CLIP ROT 10 11.4 M/L (STAPLE) ×3
APR CLP MED LRG 11.4X10 (STAPLE) ×1
BAG RETRIEVAL 10 (BASKET) ×1
BAG RETRIEVAL 10MM (BASKET) ×1
BLADE SURG 15 STRL LF DISP TIS (BLADE) ×1 IMPLANT
BLADE SURG 15 STRL SS (BLADE) ×3
CHLORAPREP W/TINT 26 (MISCELLANEOUS) ×3 IMPLANT
CLIP APPLIE ROT 10 11.4 M/L (STAPLE) ×1 IMPLANT
CLOTH BEACON ORANGE TIMEOUT ST (SAFETY) ×3 IMPLANT
COVER LIGHT HANDLE STERIS (MISCELLANEOUS) ×6 IMPLANT
DERMABOND ADVANCED (GAUZE/BANDAGES/DRESSINGS) ×2
DERMABOND ADVANCED .7 DNX12 (GAUZE/BANDAGES/DRESSINGS) ×1 IMPLANT
ELECT REM PT RETURN 9FT ADLT (ELECTROSURGICAL) ×3
ELECTRODE REM PT RTRN 9FT ADLT (ELECTROSURGICAL) ×1 IMPLANT
GAUZE 4X4 16PLY ~~LOC~~+RFID DBL (SPONGE) ×3 IMPLANT
GLOVE SURG ENC MOIS LTX SZ6.5 (GLOVE) ×3 IMPLANT
GLOVE SURG UNDER POLY LF SZ7 (GLOVE) ×12 IMPLANT
GOWN STRL REUS W/TWL LRG LVL3 (GOWN DISPOSABLE) ×9 IMPLANT
HEMOSTAT SNOW SURGICEL 2X4 (HEMOSTASIS) ×3 IMPLANT
INST SET LAPROSCOPIC AP (KITS) ×3 IMPLANT
KIT TURNOVER KIT A (KITS) ×3 IMPLANT
MANIFOLD NEPTUNE II (INSTRUMENTS) ×3 IMPLANT
NEEDLE INSUFFLATION 14GA 120MM (NEEDLE) ×3 IMPLANT
NS IRRIG 1000ML POUR BTL (IV SOLUTION) ×3 IMPLANT
PACK LAP CHOLE LZT030E (CUSTOM PROCEDURE TRAY) ×3 IMPLANT
PAD ARMBOARD 7.5X6 YLW CONV (MISCELLANEOUS) ×3 IMPLANT
SET BASIN LINEN APH (SET/KITS/TRAYS/PACK) ×3 IMPLANT
SET TUBE SMOKE EVAC HIGH FLOW (TUBING) ×3 IMPLANT
SLEEVE ENDOPATH XCEL 5M (ENDOMECHANICALS) ×3 IMPLANT
SUT MNCRL AB 4-0 PS2 18 (SUTURE) ×6 IMPLANT
SUT VICRYL 0 UR6 27IN ABS (SUTURE) ×3 IMPLANT
SYS BAG RETRIEVAL 10MM (BASKET) ×1
SYSTEM BAG RETRIEVAL 10MM (BASKET) ×1 IMPLANT
TROCAR ENDO BLADELESS 11MM (ENDOMECHANICALS) ×3 IMPLANT
TROCAR XCEL NON-BLD 5MMX100MML (ENDOMECHANICALS) ×3 IMPLANT
TROCAR XCEL UNIV SLVE 11M 100M (ENDOMECHANICALS) ×3 IMPLANT
TUBE CONNECTING 12'X1/4 (SUCTIONS) ×1
TUBE CONNECTING 12X1/4 (SUCTIONS) ×2 IMPLANT
WARMER LAPAROSCOPE (MISCELLANEOUS) ×3 IMPLANT

## 2021-05-24 NOTE — Transfer of Care (Signed)
Immediate Anesthesia Transfer of Care Note  Patient: Carol Vargas  Procedure(s) Performed: LAPAROSCOPIC CHOLECYSTECTOMY (Abdomen)  Patient Location: PACU  Anesthesia Type:General  Level of Consciousness: awake  Airway & Oxygen Therapy: Patient Spontanous Breathing and Patient connected to nasal cannula oxygen  Post-op Assessment: Report given to RN and Post -op Vital signs reviewed and stable  Post vital signs: Reviewed and stable  Last Vitals:  Vitals Value Taken Time  BP 134/84 05/24/21 0835  Temp 36.2 C 05/24/21 0835  Pulse 53 05/24/21 0837  Resp 16 05/24/21 0837  SpO2 100 % 05/24/21 0837  Vitals shown include unvalidated device data.  Last Pain: There were no vitals filed for this visit.       Complications: No notable events documented.

## 2021-05-24 NOTE — Interval H&P Note (Signed)
History and Physical Interval Note:  05/24/2021 7:25 AM  Carol Vargas  has presented today for surgery, with the diagnosis of Cholelithiasis.  The various methods of treatment have been discussed with the patient and family. After consideration of risks, benefits and other options for treatment, the patient has consented to  Procedure(s): LAPAROSCOPIC CHOLECYSTECTOMY (N/A) as a surgical intervention.  The patient's history has been reviewed, patient examined, no change in status, stable for surgery.  I have reviewed the patient's chart and labs.  Questions were answered to the patient's satisfaction.     Virl Cagey

## 2021-05-24 NOTE — Progress Notes (Signed)
[facility]  Updated family. [facility] [place]. Will do post op phone call 12/1.  Curlene Labrum, MD [facility] [address] [place], [place] [address] [phone] (office)

## 2021-05-24 NOTE — Anesthesia Postprocedure Evaluation (Signed)
Anesthesia Post Note  Patient: Carol Vargas  Procedure(s) Performed: LAPAROSCOPIC CHOLECYSTECTOMY (Abdomen)  Patient location during evaluation: Phase II Anesthesia Type: General Level of consciousness: awake Pain management: pain level controlled Vital Signs Assessment: post-procedure vital signs reviewed and stable Respiratory status: spontaneous breathing and respiratory function stable Cardiovascular status: blood pressure returned to baseline and stable Postop Assessment: no headache and no apparent nausea or vomiting Anesthetic complications: no Comments: Late entry   No notable events documented.   Last Vitals:  Vitals:   05/24/21 0900 05/24/21 0957  BP: 128/83 135/87  Pulse: (!) 54 (!) 59  Resp: 12 14  Temp:  36.4 C  SpO2: 100% 99%    Last Pain:  Vitals:   05/24/21 0957  TempSrc: Axillary  PainSc: [place]

## 2021-05-24 NOTE — Op Note (Signed)
Operative Note   Preoperative Diagnosis: Symptomatic cholelithiasis   Postoperative Diagnosis: Same   Procedure(s) Performed: Laparoscopic cholecystectomy   Surgeon: Ria Comment C. Constance Haw, MD   Assistants: Aviva Signs, MD    Anesthesia: General endotracheal   Anesthesiologist: Louann Sjogren, MD    Specimens: Gallbladder    Estimated Blood Loss: Minimal    Blood Replacement: None    Complications: None    Operative Findings: Distended gallbladder, minor omental adhesions to right lower abdominal wall    Procedure: The patient was taken to the operating room and placed supine. General endotracheal anesthesia was induced. Intravenous antibiotics were administered per protocol. An orogastric tube positioned to decompress the stomach. The abdomen was prepared and draped in the usual sterile fashion.    A supraumbilical  incision was made and a Veress technique was utilized to achieve pneumoperitoneum to 15 mmHg with carbon dioxide. A 11 mm optiview port was placed through the supraumbilical region, and a 10 mm 0-degree operative laparoscope was introduced. The area underlying the trocar and Veress needle were inspected and without evidence of injury. There were some omental adhesions to the right lower abdominal wall and below the umbilicus but nothing was injured or needed to be taken down to do the surgery.  Remaining trocars were placed under direct vision. Two 5 mm ports were placed in the right abdomen, between the anterior axillary and midclavicular line.  A final 11 mm port was placed through the mid-epigastrium, near the falciform ligament.    The gallbladder fundus was elevated cephalad and the infundibulum was retracted to the patient's right. The gallbladder/cystic duct junction was skeletonized. The cystic artery noted in the triangle of Calot and was also skeletonized.  We then continued liberal medial and lateral dissection until the critical view of safety was achieved.     The cystic duct and cystic artery were triply clipped and divided. The gallbladder was then dissected from the liver bed with electrocautery. The specimen was placed in an Endopouch and was retrieved through the epigastric site. We looked back at the umbilical trocar from the epigastric trocar given the omental adhesions to ensure again no injury to anything, and there was no bleeding and the bowel was not adherent to the wall.    Final inspection revealed acceptable hemostasis. Surgical SNOW was placed in the gallbladder bed.  Trocars were removed and pneumoperitoneum was released.  0 Vicryl fascial sutures were used to close the epigastric and umbilical port sites. Skin incisions were closed with 4-0 Monocryl subcuticular sutures and Dermabond. The patient was awakened from anesthesia and extubated without complication.    Curlene Labrum, MD [facility] [address] [place], [place] [address] [phone] (office)

## 2021-05-24 NOTE — Anesthesia Procedure Notes (Signed)
Procedure Name: Intubation Date/Time: 05/24/2021 7:47 AM Performed by: Orlie Dakin, CRNA Pre-anesthesia Checklist: Patient identified, Emergency Drugs available, Suction available and Patient being monitored Patient Re-evaluated:Patient Re-evaluated prior to induction Oxygen Delivery Method: Circle system utilized Preoxygenation: Pre-oxygenation with 100% oxygen Induction Type: IV induction Ventilation: Mask ventilation without difficulty and Oral airway inserted - appropriate to patient size Laryngoscope Size: Mac and 4 Grade View: Grade II Tube type: Oral Tube size: 7.5 mm Number of attempts: 1 Airway Equipment and Method: Stylet Placement Confirmation: ETT inserted through vocal cords under direct vision, positive ETCO2 and breath sounds checked- equal and bilateral Secured at: 22 cm Tube secured with: Tape Dental Injury: Teeth and Oropharynx as per pre-operative assessment  Comments: 4x4s bite block used.

## 2021-05-24 NOTE — Discharge Instructions (Signed)
Discharge Laparoscopic Surgery Instructions:  Common Complaints: Right shoulder pain is common after laparoscopic surgery. This is secondary to the gas used in the surgery being trapped under the diaphragm.  Walk to help your body absorb the gas. This will improve in a few days. Pain at the port sites are common, especially the larger port sites. This will improve with time.  Some nausea is common and poor appetite. The main goal is to stay hydrated the first few days after surgery.   Diet/ Activity: Diet as tolerated. You may not have an appetite, but it is important to stay hydrated. Drink 64 ounces of water a day. Your appetite will return with time.  Shower per your regular routine daily.  Do not take hot showers. Take warm showers that are less than 10 minutes. Rest and listen to your body, but do not remain in bed all day.  Walk everyday for at least 15-20 minutes. Deep cough and move around every 1-2 hours in the first few days after surgery.  Do not lift > 10 lbs, perform excessive bending, pushing, pulling, squatting for 1-2 weeks after surgery.  Do not pick at the dermabond glue on your incision sites.  This glue film will remain in place for 1-2 weeks and will start to peel off.  Do not place lotions or balms on your incision unless instructed to specifically by Dr. Tarae Wooden.   Pain Expectations and Narcotics: -After surgery you will have pain associated with your incisions and this is normal. The pain is muscular and nerve pain, and will get better with time. -You are encouraged and expected to take non narcotic medications like tylenol and ibuprofen (when able) to treat pain as multiple modalities can aid with pain treatment. -Narcotics are only used when pain is severe or there is breakthrough pain. -You are not expected to have a pain score of 0 after surgery, as we cannot prevent pain. A pain score of 3-4 that allows you to be functional, move, walk, and tolerate some activity is  the goal. The pain will continue to improve over the days after surgery and is dependent on your surgery. -Due to [place] law, we are only able to give a certain amount of pain medication to treat post operative pain, and we only give additional narcotics on a patient by patient basis.  -For most laparoscopic surgery, studies have shown that the majority of patients only need 10-15 narcotic pills, and for open surgeries most patients only need 15-20.   -Having appropriate expectations of pain and knowledge of pain management with non narcotics is important as we do not want anyone to become addicted to narcotic pain medication.  -Using ice packs in the first 48 hours and heating pads after 48 hours, wearing an abdominal binder (when recommended), and using over the counter medications are all ways to help with pain management.   -Simple acts like meditation and mindfulness practices after surgery can also help with pain control and research has proven the benefit of these practices.  Medication: Take tylenol and ibuprofen as needed for pain control, alternating every 4-6 hours.  Example:  Tylenol 1000mg @ 6am, 12noon, 6pm, 12midnight (Do not exceed 4000mg of tylenol a day). Ibuprofen 800mg @ 9am, 3pm, 9pm, 3am (Do not exceed 3600mg of ibuprofen a day).  Take Roxicodone for breakthrough pain every 4 hours.  Take Colace for constipation related to narcotic pain medication. If you do not have a bowel movement in 2 days, take Miralax   over the counter.  Drink plenty of water to also prevent constipation.   Contact Information: If you have questions or concerns, please call our office, [phone], Monday- Thursday 8AM-5PM and Friday 8AM-12Noon.  If it is after hours or on the weekend, please call [facility] Main Number, [phone], [phone], and ask to speak to the surgeon on call for Dr. Mickal Meno at [facility].   

## 2021-05-24 NOTE — Anesthesia Preprocedure Evaluation (Signed)
Anesthesia Evaluation  Patient identified by MRN, date of birth, ID band Patient awake    Reviewed: Allergy & Precautions, H&P , NPO status , Patient's Chart, lab work & pertinent test results, reviewed documented beta blocker date and time   Airway Mallampati: II  TM Distance: >3 FB Neck ROM: full    Dental no notable dental hx.    Pulmonary neg pulmonary ROS, former smoker,    Pulmonary exam normal breath sounds clear to auscultation       Cardiovascular Exercise Tolerance: Good hypertension, negative cardio ROS   Rhythm:regular Rate:Normal     Neuro/Psych  Headaches, negative psych ROS   GI/Hepatic negative GI ROS, Neg liver ROS,   Endo/Other  negative endocrine ROS  Renal/GU negative Renal ROS  negative genitourinary   Musculoskeletal   Abdominal   Peds  Hematology negative hematology ROS (+)   Anesthesia Other Findings Sjogrens  Reproductive/Obstetrics negative OB ROS                             Anesthesia Physical Anesthesia Plan  ASA: 3  Anesthesia Plan: General ETT   Post-op Pain Management:    Induction:   PONV Risk Score and Plan: Ondansetron  Airway Management Planned:   Additional Equipment:   Intra-op Plan:   Post-operative Plan:   Informed Consent: I have reviewed the patients History and Physical, chart, labs and discussed the procedure including the risks, benefits and alternatives for the proposed anesthesia with the patient or authorized representative who has indicated his/her understanding and acceptance.     Dental Advisory Given  Plan Discussed with: CRNA  Anesthesia Plan Comments:         Anesthesia Quick Evaluation

## 2021-05-25 ENCOUNTER — Encounter (HOSPITAL_COMMUNITY): Payer: Self-pay | Admitting: General Surgery

## 2021-05-25 LAB — SURGICAL PATHOLOGY

## 2021-06-08 ENCOUNTER — Ambulatory Visit (INDEPENDENT_AMBULATORY_CARE_PROVIDER_SITE_OTHER): Payer: BC Managed Care – PPO | Admitting: General Surgery

## 2021-06-08 DIAGNOSIS — K8020 Calculus of gallbladder without cholecystitis without obstruction: Secondary | ICD-10-CM

## 2021-06-09 ENCOUNTER — Other Ambulatory Visit: Payer: Self-pay

## 2021-06-12 NOTE — Progress Notes (Signed)
[facility]  I am calling the patient for post operative evaluation. I missed her phone call on 12/1 and called her on 12/5. This is not a billable encounter as it is under the [place] charges for the surgery.  The patient had a laparoscopic cholecystectomy on 05/24/2021. The patient reports that she is doing well. The are tolerating a diet, having good pain control, and having regular Bms now after taking some stool softeners. She did have some constipation initially.  The incisions are healing well without issues. The patient has no other concerns.   Pathology: A. GALLBLADDER, CHOLECYSTECTOMY:  Chronic cholecystitis and cholelithiasis.  Focal adenomyosis of the gallbladder.  Negative for a neoplasm.   Will see the patient PRN.   Curlene Labrum, MD [facility] [address] [place], [place] [address] [phone] (office)

## 2021-07-13 DIAGNOSIS — Z96651 Presence of right artificial knee joint: Secondary | ICD-10-CM | POA: Diagnosis not present

## 2021-07-13 DIAGNOSIS — M25562 Pain in left knee: Secondary | ICD-10-CM | POA: Diagnosis not present

## 2021-07-13 DIAGNOSIS — M1712 Unilateral primary osteoarthritis, left knee: Secondary | ICD-10-CM | POA: Diagnosis not present

## 2021-07-25 ENCOUNTER — Telehealth: Payer: BC Managed Care – PPO | Admitting: Nurse Practitioner

## 2021-07-25 ENCOUNTER — Telehealth: Payer: Self-pay

## 2021-07-25 ENCOUNTER — Ambulatory Visit (INDEPENDENT_AMBULATORY_CARE_PROVIDER_SITE_OTHER): Payer: BC Managed Care – PPO | Admitting: Family Medicine

## 2021-07-25 ENCOUNTER — Encounter: Payer: Self-pay | Admitting: Family Medicine

## 2021-07-25 DIAGNOSIS — J069 Acute upper respiratory infection, unspecified: Secondary | ICD-10-CM | POA: Diagnosis not present

## 2021-07-25 DIAGNOSIS — R051 Acute cough: Secondary | ICD-10-CM | POA: Diagnosis not present

## 2021-07-25 LAB — VERITOR FLU A/B WAIVED
Influenza A: NEGATIVE
Influenza B: NEGATIVE

## 2021-07-25 NOTE — Telephone Encounter (Signed)
Lmtcb.

## 2021-07-25 NOTE — Telephone Encounter (Signed)
This is just a viral illness. Symptom management - throat lozenges, chloraseptic spray, Tylenol/Ibuprofen, honey, hot tea, warm salt water gargles, etc. She can get a note for today and return to work on Thursday since she is off tomorrow.

## 2021-07-25 NOTE — Progress Notes (Signed)
Virtual Visit via Telephone Note  I connected with Carol Vargas on 07/25/21 at 8:22 AM by telephone and verified that I am speaking with the correct person using two identifiers. Carol Vargas is currently located in her vehicle and nobody is currently with her during this visit. The provider, Loman Brooklyn, FNP is located in their office at time of visit.  I discussed the limitations, risks, security and privacy concerns of performing an evaluation and management service by telephone and the availability of in person appointments. I also discussed with the patient that there may be a patient responsible charge related to this service. The patient expressed understanding and agreed to proceed.  Subjective: PCP: Sharion Balloon, FNP  Chief Complaint  Patient presents with   URI   Patient complains of cough, head congestion, headache, sneezing, and sore throat. Onset of symptoms was 1 day ago, unchanged since that time. She is drinking plenty of fluids. Evaluation to date: at home COVID test negative yesterday. Treatment to date:  Tylenol and Zicam . She does not smoke.    ROS: Per HPI  Current Outpatient Medications:    amLODipine (NORVASC) 10 MG tablet, Take 1 tablet (10 mg total) by mouth daily., Disp: 90 tablet, Rfl: 2   diclofenac (VOLTAREN) 75 MG EC tablet, Take 1 tablet (75 mg total) by mouth 2 (two) times daily., Disp: 60 tablet, Rfl: 2   losartan-hydrochlorothiazide (HYZAAR) 100-25 MG tablet, Take 1 tablet by mouth daily., Disp: 90 tablet, Rfl: 3   Multiple Vitamins-Minerals (MULTIVITAMIN WITH MINERALS) tablet, Take 1 tablet by mouth daily., Disp: , Rfl:    ondansetron (ZOFRAN) 4 MG tablet, Take 1 tablet (4 mg total) by mouth every 8 (eight) hours as needed., Disp: 30 tablet, Rfl: 1   oxyCODONE (ROXICODONE) 5 MG immediate release tablet, Take 1 tablet (5 mg total) by mouth every 4 (four) hours as needed for severe pain or breakthrough pain., Disp: 20 tablet, Rfl: 0    phentermine (ADIPEX-P) 37.5 MG tablet, Take 1 tablet (37.5 mg total) by mouth daily before breakfast., Disp: 90 tablet, Rfl: 0  Allergies  Allergen Reactions   Lisinopril Cough   Past Medical History:  Diagnosis Date   Arthritis    Back pain    Bilateral swelling of feet    Fibroids    Gallstone 2021   Headache(784.0)    at times, nothing severe per pt.menstral   High cholesterol    Hypertension    Joint pain    Sjogrens syndrome (HCC)    Vitamin D deficiency     Observations/Objective: A&O  No respiratory distress or wheezing audible over the phone Mood, judgement, and thought processes all WNL  Assessment and Plan: 1. Viral URI Symptom management.  2. Acute cough - Veritor Flu A/B Waived; Future   Follow Up Instructions:  I discussed the assessment and treatment plan with the patient. The patient was provided an opportunity to ask questions and all were answered. The patient agreed with the plan and demonstrated an understanding of the instructions.   The patient was advised to call back or seek an in-person evaluation if the symptoms worsen or if the condition fails to improve as anticipated.  The above assessment and management plan was discussed with the patient. The patient verbalized understanding of and has agreed to the management plan. Patient is aware to call the clinic if symptoms persist or worsen. Patient is aware when to return to the clinic for a follow-up visit. Patient  educated on when it is appropriate to go to the emergency department.   Time call ended: 8:33 AM  I provided 11 minutes of non-face-to-face time during this encounter.  Hendricks Limes, MSN, APRN, FNP-C [facility] 07/25/21

## 2021-07-25 NOTE — Telephone Encounter (Signed)
Patient saw the results of her flu test on mychart.  She would like to know what you recommend she do about her sore throat.  She also needs a work note.  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  [facility] if needed.

## 2021-07-31 NOTE — Telephone Encounter (Signed)
Patient aware and verbalizes understanding.  Letter has been completed and patient will print on mychart.

## 2021-09-06 ENCOUNTER — Encounter: Payer: Self-pay | Admitting: Family Medicine

## 2021-09-06 ENCOUNTER — Ambulatory Visit (INDEPENDENT_AMBULATORY_CARE_PROVIDER_SITE_OTHER): Payer: 59 | Admitting: Family Medicine

## 2021-09-06 VITALS — BP 129/90 | HR 83 | Temp 97.9°F | Ht 67.0 in | Wt 253.2 lb

## 2021-09-06 DIAGNOSIS — M5442 Lumbago with sciatica, left side: Secondary | ICD-10-CM

## 2021-09-06 MED ORDER — METHYLPREDNISOLONE ACETATE 40 MG/ML IJ SUSP
40.0000 mg | Freq: Once | INTRAMUSCULAR | Status: AC
  Administered 2021-09-06: 40 mg via INTRAMUSCULAR

## 2021-09-06 MED ORDER — PREDNISONE 20 MG PO TABS: 20.0000 mg | ORAL_TABLET | Freq: Every day | ORAL | 0 refills | Status: AC

## 2021-09-06 NOTE — Addendum Note (Signed)
Addended by: Milas Hock on: 09/06/2021 11:43 AM ? ? Modules accepted: Orders ? ?

## 2021-09-06 NOTE — Progress Notes (Signed)
Subjective:  Patient ID: Carol Vargas, female    DOB: 1975-11-25, 46 y.o.   MRN: [id number]  Patient Care Team: Sharion Balloon, FNP as PCP - General (Family Medicine)   Chief Complaint:  Back Pain   HPI: Carol Vargas is a 46 y.o. female presenting on 09/06/2021 for Back Pain   Back Pain This is a new problem. Episode onset: 10 days ago. The problem occurs daily. The problem has been waxing and waning since onset. The pain is present in the gluteal, lumbar spine and sacro-iliac. The pain radiates to the left thigh. The pain is at a severity of 6/10. The pain is moderate. The symptoms are aggravated by bending, position, standing and twisting. Associated symptoms include leg pain and tingling. Pertinent negatives include no abdominal pain, bladder incontinence, bowel incontinence, chest pain, dysuria, fever, headaches, numbness, paresis, paresthesias, pelvic pain, perianal numbness, weakness or weight loss. She has tried bed rest, analgesics and ice for the symptoms. The treatment provided mild relief.    Relevant past medical, surgical, family, and social history reviewed and updated as indicated.  Allergies and medications reviewed and updated. Data reviewed: Chart in Epic.   Past Medical History:  Diagnosis Date   Arthritis    Back pain    Bilateral swelling of feet    Fibroids    Gallstone 2021   Headache(784.0)    at times, nothing severe per pt.menstral   High cholesterol    Hypertension    Joint pain    Sjogrens syndrome ([place])    Vitamin D deficiency     Past Surgical History:  Procedure Laterality Date   ABDOMINAL HYSTERECTOMY     BACK SURGERY     L4 L5 fusion and instrumentation Jul 06 2020    BILATERAL SALPINGECTOMY Right 01/29/2017   Procedure: RIGHT SALPINGECTOMY;  Surgeon: Jonnie Kind, MD;  Location: [facility];  Service: Gynecology;  Laterality: Right;   BREAST REDUCTION SURGERY Bilateral 05/12/2019   Procedure: BILATERAL MAMMARY REDUCTION  (BREAST);   Surgeon: [name], MD;  Location: [place];  Service: Plastics;  Laterality: Bilateral;  3.5 hours, please   CESAREAN SECTION     CHOLECYSTECTOMY N/A 05/24/2021   Procedure: LAPAROSCOPIC CHOLECYSTECTOMY;  Surgeon: Virl Cagey, MD;  Location: [facility];  Service: General;  Laterality: N/A;   DILATION AND CURETTAGE OF UTERUS     EXTERNAL FIXATION LEG Right 07/05/2013   Procedure: EXTERNAL FIXATION LEG;  Surgeon: Marianna Payment, MD;  Location: [place];  Service: Orthopedics;  Laterality: Right;   EXTERNAL FIXATION LEG Right 07/07/2013   Procedure: Ex-Fix Revision Right Tibial Plateau;  Surgeon: Rozanna Box, MD;  Location: [place];  Service: Orthopedics;  Laterality: Right;   EXTERNAL FIXATION REMOVAL Right 07/28/2013   Procedure: REMOVAL EXTERNAL FIXATION LEG;  Surgeon: Rozanna Box, MD;  Location: [place];  Service: Orthopedics;  Laterality: Right;   HARDWARE REMOVAL Right 08/02/2014   Procedure: HARDWARE REMOVAL;  Surgeon: Vickey Huger, MD;  Location: [facility];  Service: Orthopedics;  Laterality: Right;   KNEE ARTHROSCOPY Right 08/02/2014   Procedure: ARTHROSCOPY KNEE;  Surgeon: Vickey Huger, MD;  [place] [facility] [facility];  Service: Orthopedics;  Laterality: Right;   ORIF TIBIA PLATEAU Right 07/28/2013   Procedure: OPEN REDUCTION INTERNAL FIXATION (ORIF) RIGHT TIBIAL PLATEAU;  Surgeon: Rozanna Box, MD;  Location: [place];  Service: Orthopedics;  Laterality: Right;   SUPRACERVICAL ABDOMINAL HYSTERECTOMY N/A 01/29/2017   Procedure: HYSTERECTOMY SUPRACERVICAL ABDOMINAL;  Surgeon: Jonnie Kind, MD;  Location: [facility];  Service: Gynecology;  Laterality: N/A;   TONSILLECTOMY  1992   TOTAL KNEE ARTHROPLASTY Right 01/03/2015   Procedure: TOTAL KNEE ARTHROPLASTY;  Surgeon: Vickey Huger, MD;  Location: [facility];  Service: Orthopedics;  Laterality: Right;  former tibial plateau fracture orif with hardware removed 07/2014   TOTAL KNEE REVISION Right 11/08/2020   Procedure: RIGHT TOTAL KNEE  REVISION;  Surgeon: Paralee Cancel, MD;  Location: [facility];  Service: Orthopedics;  Laterality: Right;    Social History   Socioeconomic History   Marital status: Legally Separated    Spouse name: Not on file   Number of children: Not on file   Years of education: Not on file   Highest education level: Not on file  Occupational History   Not on file  Tobacco Use   Smoking status: Former    Packs/day: 0.50    Years: 5.00    Pack years: 2.50    Types: Cigarettes    Quit date: 02/06/2013    Years since quitting: 8.5   Smokeless tobacco: Never  Vaping Use   Vaping Use: Never used  Substance and Sexual Activity   Alcohol use: No   Drug use: No   Sexual activity: Not Currently    Birth control/protection: Surgical    Comment: hyst  Other Topics Concern   Not on file  Social History Narrative   Not on file   Social Determinants of Health   Financial Resource Strain: Not on file  Food Insecurity: Not on file  Transportation Needs: Not on file  Physical Activity: Not on file  Stress: Not on file  Social Connections: Not on file  Intimate Partner Violence: Not on file    Outpatient Encounter Medications as of 09/06/2021  Medication Sig   amLODipine (NORVASC) 10 MG tablet Take 1 tablet (10 mg total) by mouth daily.   diclofenac (VOLTAREN) 75 MG EC tablet Take 1 tablet (75 mg total) by mouth 2 (two) times daily.   losartan-hydrochlorothiazide (HYZAAR) 100-25 MG tablet Take 1 tablet by mouth daily.   predniSONE (DELTASONE) 20 MG tablet Take 1 tablet (20 mg total) by mouth daily with breakfast for 5 days.   Multiple Vitamins-Minerals (MULTIVITAMIN WITH MINERALS) tablet Take 1 tablet by mouth daily. (Patient not taking: Reported on 09/06/2021)   phentermine (ADIPEX-P) 37.5 MG tablet Take 1 tablet (37.5 mg total) by mouth daily before breakfast. (Patient not taking: Reported on 09/06/2021)   [DISCONTINUED] ondansetron (ZOFRAN) 4 MG tablet Take 1 tablet (4 mg total) by mouth every 8  (eight) hours as needed.   [DISCONTINUED] oxyCODONE (ROXICODONE) 5 MG immediate release tablet Take 1 tablet (5 mg total) by mouth every 4 (four) hours as needed for severe pain or breakthrough pain.   No facility-administered encounter medications on file as of 09/06/2021.    Allergies  Allergen Reactions   Lisinopril Cough    Review of Systems  Constitutional:  Positive for activity change. Negative for appetite change, chills, diaphoresis, fatigue, fever, unexpected weight change and weight loss.  Respiratory:  Negative for cough and shortness of breath.   Cardiovascular:  Negative for chest pain, palpitations and leg swelling.  Gastrointestinal:  Negative for abdominal distention, abdominal pain and bowel incontinence.  Genitourinary:  Negative for bladder incontinence, decreased urine volume, difficulty urinating, dysuria and pelvic pain.  Musculoskeletal:  Positive for back pain and gait problem (antalgic). Negative for arthralgias, joint swelling, myalgias, neck pain and neck stiffness.  Neurological:  Positive for tingling. Negative for dizziness, tremors, seizures, syncope, facial asymmetry, speech difficulty, weakness, light-headedness, numbness, headaches and paresthesias.  Psychiatric/Behavioral:  Negative for confusion.   All other systems reviewed and are negative.      Objective:  BP 129/90    Pulse 83    Temp 97.9 F (36.6 C) (Temporal)    Ht 5\' 7"  (1.702 m)    Wt 253 lb 4 oz (114.9 kg)    LMP 01/06/2017 (Exact Date)    BMI 39.66 kg/m    Wt Readings from Last 3 Encounters:  09/06/21 253 lb 4 oz (114.9 kg)  05/03/21 263 lb (119.3 kg)  04/27/21 270 lb 6.4 oz (122.7 kg)    Physical Exam Vitals and nursing note reviewed.  Constitutional:      General: She is not in acute distress.    Appearance: Normal appearance. She is obese. She is not ill-appearing or toxic-appearing.  HENT:     Head: Normocephalic and atraumatic.  Eyes:     Pupils: Pupils are equal, round,  and reactive to light.  Cardiovascular:     Rate and Rhythm: Normal rate and regular rhythm.  Pulmonary:     Effort: Pulmonary effort is normal.  Abdominal:     Palpations: Abdomen is soft.  Musculoskeletal:     Thoracic back: Normal.     Lumbar back: No swelling, edema, deformity, signs of trauma, lacerations, spasms, tenderness or bony tenderness. Decreased range of motion. Positive left straight leg raise test. Negative right straight leg raise test. No scoliosis.     Right hip: Normal.     Left hip: Normal.  Skin:    General: Skin is warm and dry.     Capillary Refill: Capillary refill takes less than 2 seconds.  Neurological:     General: No focal deficit present.     Mental Status: She is alert and oriented to person, place, and time.     Cranial Nerves: No cranial nerve deficit.     Sensory: No sensory deficit.     Motor: No weakness.     Coordination: Coordination normal.     Gait: Gait abnormal (slow, antalgic).     Deep Tendon Reflexes: Reflexes normal.  Psychiatric:        Mood and Affect: Mood normal.        Behavior: Behavior normal.        Thought Content: Thought content normal.        Judgment: Judgment normal.    Results for orders placed or performed in visit on 07/25/21  Veritor Flu A/B Waived  Result Value Ref Range   Influenza A Negative Negative   Influenza B Negative Negative       Pertinent labs & imaging results that were available during my care of the patient were reviewed by me and considered in my medical decision making.  Assessment & Plan:  Aviya was seen today for back pain.  Diagnoses and all orders for this visit:  Acute left-sided low back pain with left-sided sciatica No red flags concerning for cauda equina syndrome. Symptomatic care discussed in detail. Will burst with steroids. Stretches at home discussed. Pt aware to report any new, worsening, or persistent symptoms. Follow up in 6 weeks or sooner if warranted.  -      predniSONE (DELTASONE) 20 MG tablet; Take 1 tablet (20 mg total) by mouth daily with breakfast for 5 days.     Continue all other maintenance medications.  Follow  up plan: Return in 6 weeks (on 10/18/2021), or if symptoms worsen or fail to improve, for back pain.   Continue healthy lifestyle choices, including diet (rich in fruits, vegetables, and lean proteins, and low in salt and simple carbohydrates) and exercise (at least 30 minutes of moderate physical activity daily).  Educational handout given for low back pain  The above assessment and management plan was discussed with the patient. The patient verbalized understanding of and has agreed to the management plan. Patient is aware to call the clinic if they develop any new symptoms or if symptoms persist or worsen. Patient is aware when to return to the clinic for a follow-up visit. Patient educated on when it is appropriate to go to the emergency department.   Monia Pouch, FNP-C [facility] [phone]

## 2021-10-04 ENCOUNTER — Encounter: Payer: Self-pay | Admitting: Family Medicine

## 2021-10-04 ENCOUNTER — Ambulatory Visit (INDEPENDENT_AMBULATORY_CARE_PROVIDER_SITE_OTHER): Payer: 59 | Admitting: Family Medicine

## 2021-10-04 DIAGNOSIS — U071 COVID-19: Secondary | ICD-10-CM | POA: Diagnosis not present

## 2021-10-04 MED ORDER — MOLNUPIRAVIR EUA 200MG CAPSULE: 4.0000 | ORAL_CAPSULE | Freq: Two times a day (BID) | ORAL | 0 refills | Status: AC

## 2021-10-04 MED ORDER — FLUTICASONE PROPIONATE 50 MCG/ACT NA SUSP: 2.0000 | Freq: Every day | NASAL | 6 refills | Status: DC

## 2021-10-04 NOTE — Progress Notes (Signed)
Telephone visit ? ?Subjective: ?CC: COVID-19 ?PCP: Sharion Balloon, FNP ?QPR:Carol Vargas is a 46 y.o. female calls for telephone consult today. Patient provides verbal consent for consult held via phone. ? ?Due to COVID-19 pandemic this visit was conducted virtually. This visit type was conducted due to national recommendations for restrictions regarding the COVID-19 Pandemic (e.g. social distancing, sheltering in place) in an effort to limit this patient's exposure and mitigate transmission in our community. All issues noted in this document were discussed and addressed.  A physical exam was not performed with this format.  ? ?Location of patient: home ?Location of provider: [facility] ?Others present for call: none ? ?1. COVID-19 ?Patient reports she tested positive x2 for COVID by home test.  She reports she was prompted to check herself because she had a bad headache that started yesterday.  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  No fevers, cough, congestion, shortness of breath. ? ? ?ROS: Per HPI ? ?Allergies  ?Allergen Reactions  ? Lisinopril Cough  ? ?Past Medical History:  ?Diagnosis Date  ? Arthritis   ? Back pain   ? Bilateral swelling of feet   ? Fibroids   ? Gallstone 2021  ? Headache(784.0)   ? at times, nothing severe per pt.menstral  ? High cholesterol   ? Hypertension   ? Joint pain   ? Sjogrens syndrome ([place])   ? Vitamin D deficiency   ? ? ?Current Outpatient Medications:  ?  amLODipine (NORVASC) 10 MG tablet, Take 1 tablet (10 mg total) by mouth daily., Disp: 90 tablet, Rfl: 2 ?  diclofenac (VOLTAREN) 75 MG EC tablet, Take 1 tablet (75 mg total) by mouth 2 (two) times daily., Disp: 60 tablet, Rfl: 2 ?  losartan-hydrochlorothiazide (HYZAAR) 100-25 MG tablet, Take 1 tablet by mouth daily., Disp: 90 tablet, Rfl: 3 ?  Multiple Vitamins-Minerals (MULTIVITAMIN WITH MINERALS) tablet, Take 1 tablet by mouth daily. (Patient not taking: Reported on 09/06/2021), Disp: ,  Rfl:  ?  phentermine (ADIPEX-P) 37.5 MG tablet, Take 1 tablet (37.5 mg total) by mouth daily before breakfast. (Patient not taking: Reported on 09/06/2021), Disp: 90 tablet, Rfl: 0 ? ?Assessment/ Plan: ?46 y.o. female  ? ?COVID-19 - Plan: molnupiravir EUA (LAGEVRIO) 200 mg CAPS capsule, fluticasone (FLONASE) 50 MCG/ACT nasal spray ? ?No red flag signs or symptoms.  She is a Dietitian.  Also has hypertension so we will treat her with Molnupiravir.  Counseled on red flag signs and symptoms warranting reevaluation.   Return to work timeline and precautions discussed.  She voiced good understanding and will follow-up as needed ? ?Start time: 12:56pm ?End time: 1:02pm ? ?Total time spent on patient care (including telephone call/ virtual visit): 6 minutes ? ?Janora Norlander, DO ?[place] ?([phone] ? ?

## 2021-11-17 ENCOUNTER — Ambulatory Visit: Payer: 59 | Admitting: Nurse Practitioner

## 2021-12-06 ENCOUNTER — Institutional Professional Consult (permissible substitution): Payer: Medicaid Other | Admitting: Plastic Surgery

## 2022-01-19 ENCOUNTER — Ambulatory Visit (INDEPENDENT_AMBULATORY_CARE_PROVIDER_SITE_OTHER): Payer: 59 | Admitting: Family

## 2022-01-19 ENCOUNTER — Encounter: Payer: Self-pay | Admitting: Family

## 2022-01-19 ENCOUNTER — Ambulatory Visit (INDEPENDENT_AMBULATORY_CARE_PROVIDER_SITE_OTHER): Payer: 59

## 2022-01-19 VITALS — BP 128/86 | HR 70 | Temp 97.5°F | Ht 67.0 in | Wt 244.6 lb

## 2022-01-19 DIAGNOSIS — Z01818 Encounter for other preprocedural examination: Secondary | ICD-10-CM

## 2022-01-19 DIAGNOSIS — I10 Essential (primary) hypertension: Secondary | ICD-10-CM | POA: Diagnosis not present

## 2022-01-19 LAB — URINALYSIS, COMPLETE
Bilirubin, UA: NEGATIVE
Glucose, UA: NEGATIVE
Ketones, UA: NEGATIVE
Leukocytes,UA: NEGATIVE
Nitrite, UA: NEGATIVE
Protein,UA: NEGATIVE
RBC, UA: NEGATIVE
Specific Gravity, UA: 1.015 (ref 1.005–1.030)
Urobilinogen, Ur: 0.2 mg/dL (ref 0.2–1.0)
pH, UA: 8.5 — ABNORMAL HIGH (ref 5.0–7.5)

## 2022-01-19 LAB — MICROSCOPIC EXAMINATION
Bacteria, UA: NONE SEEN
RBC, Urine: NONE SEEN /hpf (ref 0–2)
Renal Epithel, UA: NONE SEEN /hpf
WBC, UA: NONE SEEN /hpf (ref 0–5)

## 2022-01-19 LAB — BAYER DCA HB A1C WAIVED: HB A1C (BAYER DCA - WAIVED): 5.5 % (ref 4.8–5.6)

## 2022-01-19 LAB — COAGUCHEK XS/INR WAIVED
INR: 1 (ref 0.9–1.1)
Prothrombin Time: 12.4 s

## 2022-01-19 NOTE — Progress Notes (Signed)
Subjective:    Patient ID: Carol Vargas, female    DOB: February 28, 1976, 46 y.o.   MRN: [id number]  Chief Complaint  Patient presents with   Surgical Clearance   Pt presents to the office today for surgical clearance for a tummy tuck. She has preop on 04/02/22 and 04/03/22. Hypertension This is a chronic problem. The current episode started more than 1 year ago. The problem has been resolved since onset. The problem is controlled. Pertinent negatives include no malaise/fatigue, peripheral edema or shortness of breath. Risk factors for coronary artery disease include obesity. The current treatment provides moderate improvement.      Review of Systems  Constitutional:  Negative for malaise/fatigue.  Respiratory:  Negative for shortness of breath.   All other systems reviewed and are negative.      Objective:   Physical Exam Vitals reviewed.  Constitutional:      General: She is not in acute distress.    Appearance: She is well-developed. She is obese.  HENT:     Head: Normocephalic and atraumatic.     Right Ear: Tympanic membrane normal.     Left Ear: Tympanic membrane normal.  Eyes:     Pupils: Pupils are equal, round, and reactive to light.  Neck:     Thyroid: No thyromegaly.  Cardiovascular:     Rate and Rhythm: Normal rate and regular rhythm.     Heart sounds: Normal heart sounds. No murmur heard. Pulmonary:     Effort: Pulmonary effort is normal. No respiratory distress.     Breath sounds: Normal breath sounds. No wheezing.  Abdominal:     General: Bowel sounds are normal. There is no distension.     Palpations: Abdomen is soft.     Tenderness: There is no abdominal tenderness.  Musculoskeletal:        General: No tenderness. Normal range of motion.     Cervical back: Normal range of motion and neck supple.  Skin:    General: Skin is warm and dry.  Neurological:     Mental Status: She is alert and oriented to person, place, and time.     Cranial Nerves: No cranial  nerve deficit.     Deep Tendon Reflexes: Reflexes are normal and symmetric.  Psychiatric:        Behavior: Behavior normal.        Thought Content: Thought content normal.        Judgment: Judgment normal.    BP 128/86   Pulse 70   Temp (!) 97.5 F (36.4 C)   Ht 5' 7" (1.702 m)   Wt 244 lb 9.6 oz (110.9 kg)   LMP 01/06/2017 (Exact Date)   SpO2 98%   BMI 38.31 kg/m      Assessment & Plan:  Carol Vargas comes in today with chief complaint of Surgical Clearance   Diagnosis and orders addressed:  1. Pre-op evaluation - EKG 12-Lead - Urinalysis, Complete - Bayer DCA Hb A1c Waived - CBC with Differential/Platelet - CMP14+EGFR - Beta hCG quant (ref lab) - HIV Antibody (routine testing w rflx) - CoaguChek XS/INR Waived - PT AND PTT - Thyroid Panel With TSH - DG Chest 2 View  2. Primary hypertension   Labs pending Health Maintenance reviewed Diet and exercise encouraged  Follow up plan: 6 months   Evelina Dun, FNP

## 2022-01-19 NOTE — Patient Instructions (Signed)
Abdominoplasty  When a person loses a lot of weight after bariatric surgery, dieting, or pregnancy, excess skin and fat develops in the abdomen. Abdominoplasty is a surgery to remove this excess skin and fat around the abdomen. The surgeon may also tighten the abdominal muscles or use liposuction to aid in the removal of excess fat and to smooth the areas of the abdominoplasty. This is often, but not always, considered a cosmetic procedure. Abdominoplasty is also commonly known as a tummy tuck. Tell a health care provider about: Any allergies you have. All medicines you are taking, including vitamins, herbs, eye drops, creams, and over-the-counter medicines. Any problems you or family members have had with anesthetic medicines. Any bleeding problems you have. Any surgeries you have had. Any medical conditions you have. Whether you are pregnant or may be pregnant. What are the risks? Generally, this is a safe procedure. However, problems may occur, including: Infection or wound opening at the surgery site. Allergic reactions to medicines. Damage to nearby structures or organs. Fluid or blood buildup under the abdominal wound. Changes in skin color. Pain or numbness. A blood clot that forms in a vein and travels to the heart or lungs. What happens before the procedure? When to stop eating and drinking Follow instructions from your health care provider about what you may eat and drink before your procedure. These may include: 8 hours before your procedure Stop eating most foods. Do not eat meat, fried foods, or fatty foods. Eat only light foods, such as toast or crackers. All liquids are okay except energy drinks and alcohol. 6 hours before your procedure Stop eating. Drink only clear liquids, such as water, clear fruit juice, black coffee, plain tea, and sports drinks. Do not drink energy drinks or alcohol. 2 hours before your procedure Stop drinking all liquids. You may be allowed to  take medicines with small sips of water. If you do not follow your health care provider's instructions, your procedure may be delayed or canceled. Medicines Ask your health care provider about: Changing or stopping your regular medicines. This is especially important if you are taking diabetes medicines or blood thinners. Taking medicines such as aspirin and ibuprofen. These medicines can thin your blood. Do not take these medicines unless your health care provider tells you to take them. Taking over-the-counter medicines, vitamins, herbs, and supplements. General instructions You may have exams or tests. These may include blood and urine tests. Do not use any products that contain nicotine or tobacco for at least 4 weeks before the procedure. These products include cigarettes, chewing tobacco, and vaping devices, such as e-cigarettes. If you need help quitting, ask your health care provider. If you will be going home right after the procedure, plan to have a responsible adult: Take you home from the hospital or clinic. You will not be allowed to drive. Care for you for the time you are told. Ask your health care provider: How your surgery site will be marked. What steps will be taken to help prevent infection. These steps may include: Removing hair at the surgery site. Washing skin with a germ-killing soap. Taking antibiotic medicine. What happens during the procedure? An IV will be inserted into one of your veins. You will be given one or more of the following: A medicine to help you relax (sedative). A medicine to numb the area (local anesthetic). A medicine to make you fall asleep (general anesthetic). An incision will be made along the lower part of your abdomen. This  is the most common area for the first incision. The length of the incision depends on how much skin and fat will be removed. Another incision may also be made around your belly button. This is done if your procedure will  extend above your belly button. A flap of your belly skin will be lifted away from the muscles of your abdomen by your surgeon. You will be placed in a flexed position on the bed. Your surgeon will determine the final position and then mark the excess skin. The marked excess skin and fat will be removed. The muscles of your abdomen may be tightened with stitches (sutures). The upper skin flap will be pulled down. A new opening for your belly button may be made in the flap. The incision will be closed with layers of sutures to strengthen the incision line and to avoid tension on the skin layer. A tube may be inserted through the flap to drain excess fluid from your surgical area. This tube may be taped to your abdomen to hold it in place. A bandage (dressing) will be placed over your incision or incisions. The procedure may vary among health care providers and hospitals. What happens after the procedure? Your blood pressure, heart rate, breathing rate, and blood oxygen level will be monitored until you leave the hospital or clinic. You may continue to receive fluids and medicines through the IV until you can eat and drink on your own. You will be given pain medicine as needed. You may have to wear compression stockings. These stockings help to prevent blood clots and reduce swelling in your legs. If you were given a sedative during the procedure, it can affect you for several hours. Do not drive or operate machinery until your health care provider says that it is safe. Summary Abdominoplasty is an operation to remove excess skin and fat from the abdomen. Follow instructions from your health care provider about what you may eat and drink before your procedure. There are a variety of ways to perform the surgery. Ask your surgeon to explain how it will be done. After the procedure, your blood pressure, heart rate, breathing rate, and blood oxygen level will be monitored until you leave the hospital  or clinic. This information is not intended to replace advice given to you by your health care provider. Make sure you discuss any questions you have with your health care provider. Document Revised: 12/26/2020 Document Reviewed: 12/26/2020 Elsevier Patient Education  [place].

## 2022-01-20 LAB — CBC WITH DIFFERENTIAL/PLATELET
Basophils Absolute: 0 10*3/uL (ref 0.0–0.2)
Basos: 1 %
EOS (ABSOLUTE): 0.1 10*3/uL (ref 0.0–0.4)
Eos: 1 %
Hematocrit: 43.7 % (ref 34.0–46.6)
Hemoglobin: 14.4 g/dL (ref 11.1–15.9)
Immature Grans (Abs): 0 10*3/uL (ref 0.0–0.1)
Immature Granulocytes: 0 %
Lymphocytes Absolute: 1.6 10*3/uL (ref 0.7–3.1)
Lymphs: 29 %
MCH: 30.5 pg (ref 26.6–33.0)
MCHC: 33 g/dL (ref 31.5–35.7)
MCV: 93 fL (ref 79–97)
Monocytes Absolute: 0.4 10*3/uL (ref 0.1–0.9)
Monocytes: 8 %
Neutrophils Absolute: 3.3 10*3/uL (ref 1.4–7.0)
Neutrophils: 61 %
Platelets: 236 10*3/uL (ref 150–450)
RBC: 4.72 x10E6/uL (ref 3.77–5.28)
RDW: 12.5 % (ref 11.7–15.4)
WBC: 5.4 10*3/uL (ref 3.4–10.8)

## 2022-01-20 LAB — HIV ANTIBODY (ROUTINE TESTING W REFLEX): HIV Screen 4th Generation wRfx: NONREACTIVE

## 2022-01-20 LAB — THYROID PANEL WITH TSH
Free Thyroxine Index: 2.6 (ref 1.2–4.9)
T3 Uptake Ratio: 27 % (ref 24–39)
T4, Total: 9.6 ug/dL (ref 4.5–12.0)
TSH: 0.743 u[IU]/mL (ref 0.450–4.500)

## 2022-01-20 LAB — BETA HCG QUANT (REF LAB): hCG Quant: <1 m[IU]/mL

## 2022-01-20 LAB — CMP14+EGFR
ALT: 25 IU/L (ref 0–32)
AST: 28 IU/L (ref 0–40)
Albumin/Globulin Ratio: 1.5 (ref 1.2–2.2)
Albumin: 4.4 g/dL (ref 3.9–4.9)
Alkaline Phosphatase: 107 IU/L (ref 44–121)
BUN/Creatinine Ratio: 13 (ref 9–23)
BUN: 11 mg/dL (ref 6–24)
Bilirubin Total: 0.3 mg/dL (ref 0.0–1.2)
CO2: 23 mmol/L (ref 20–29)
Calcium: 9.9 mg/dL (ref 8.7–10.2)
Chloride: 101 mmol/L (ref 96–106)
Creatinine, Ser: 0.88 mg/dL (ref 0.57–1.00)
Globulin, Total: 3 g/dL (ref 1.5–4.5)
Glucose: 89 mg/dL (ref 70–99)
Potassium: 3.9 mmol/L (ref 3.5–5.2)
Sodium: 145 mmol/L — ABNORMAL HIGH (ref 134–144)
Total Protein: 7.4 g/dL (ref 6.0–8.5)
eGFR: 82 mL/min/{1.73_m2} (ref >59)

## 2022-01-20 LAB — PT AND PTT
INR: 1 (ref 0.9–1.2)
Prothrombin Time: 10.1 s (ref 9.1–12.0)
aPTT: 30 s (ref 24–33)

## 2022-01-26 ENCOUNTER — Other Ambulatory Visit: Payer: Self-pay | Admitting: Family

## 2022-01-26 DIAGNOSIS — Z713 Dietary counseling and surveillance: Secondary | ICD-10-CM

## 2022-01-26 MED ORDER — PHENTERMINE HCL 37.5 MG PO TABS: 37.5000 mg | ORAL_TABLET | Freq: Every day | ORAL | 2 refills | Status: DC

## 2022-02-14 ENCOUNTER — Encounter (INDEPENDENT_AMBULATORY_CARE_PROVIDER_SITE_OTHER): Payer: Self-pay

## 2022-02-23 ENCOUNTER — Ambulatory Visit (INDEPENDENT_AMBULATORY_CARE_PROVIDER_SITE_OTHER): Payer: 59

## 2022-02-23 ENCOUNTER — Ambulatory Visit (INDEPENDENT_AMBULATORY_CARE_PROVIDER_SITE_OTHER): Payer: 59 | Admitting: Family

## 2022-02-23 ENCOUNTER — Encounter: Payer: Self-pay | Admitting: Family

## 2022-02-23 VITALS — BP 113/77 | HR 66 | Temp 97.3°F | Ht 67.0 in | Wt 242.2 lb

## 2022-02-23 DIAGNOSIS — Z713 Dietary counseling and surveillance: Secondary | ICD-10-CM | POA: Diagnosis not present

## 2022-02-23 DIAGNOSIS — I10 Essential (primary) hypertension: Secondary | ICD-10-CM | POA: Diagnosis not present

## 2022-02-23 DIAGNOSIS — Z01818 Encounter for other preprocedural examination: Secondary | ICD-10-CM

## 2022-02-23 LAB — URINALYSIS, COMPLETE
Bilirubin, UA: NEGATIVE
Glucose, UA: NEGATIVE
Nitrite, UA: NEGATIVE
Protein,UA: NEGATIVE
RBC, UA: NEGATIVE
Specific Gravity, UA: 1.025 (ref 1.005–1.030)
Urobilinogen, Ur: 0.2 mg/dL (ref 0.2–1.0)
pH, UA: 6 (ref 5.0–7.5)

## 2022-02-23 LAB — MICROSCOPIC EXAMINATION
RBC, Urine: NONE SEEN /hpf (ref 0–2)
Renal Epithel, UA: NONE SEEN /hpf
WBC, UA: NONE SEEN /hpf (ref 0–5)

## 2022-02-23 LAB — BAYER DCA HB A1C WAIVED: HB A1C (BAYER DCA - WAIVED): 5.7 % — ABNORMAL HIGH (ref 4.8–5.6)

## 2022-02-23 NOTE — Patient Instructions (Signed)

## 2022-02-23 NOTE — Progress Notes (Signed)
Subjective:    Patient ID: Carol Vargas, female    DOB: 1976/02/20, 46 y.o.   MRN: [id number]  Chief Complaint  Patient presents with   Follow-up   Pt presents to the office today for surgical clearance for a tummy tuck. She has preop on 04/02/22 and 04/03/22.  She is morbid obese with a BMI of 37 and hypertension and hyperlipidemia.  Hypertension This is a chronic problem. The current episode started more than 1 year ago. The problem has been resolved since onset. The problem is controlled. Pertinent negatives include no malaise/fatigue, peripheral edema or shortness of breath. Risk factors for coronary artery disease include dyslipidemia and obesity. The current treatment provides moderate improvement. There is no history of heart failure.      Review of Systems  Constitutional:  Negative for malaise/fatigue.  Respiratory:  Negative for shortness of breath.   All other systems reviewed and are negative.      Objective:   Physical Exam Vitals reviewed.  Constitutional:      General: She is not in acute distress.    Appearance: She is well-developed. She is obese.  HENT:     Head: Normocephalic and atraumatic.     Right Ear: Tympanic membrane normal.     Left Ear: Tympanic membrane normal.  Eyes:     Pupils: Pupils are equal, round, and reactive to light.  Neck:     Thyroid: No thyromegaly.  Cardiovascular:     Rate and Rhythm: Normal rate and regular rhythm.     Heart sounds: Normal heart sounds. No murmur heard. Pulmonary:     Effort: Pulmonary effort is normal. No respiratory distress.     Breath sounds: Normal breath sounds. No wheezing.  Abdominal:     General: Bowel sounds are normal. There is no distension.     Palpations: Abdomen is soft.     Tenderness: There is no abdominal tenderness.  Musculoskeletal:        General: No tenderness. Normal range of motion.     Cervical back: Normal range of motion and neck supple.  Skin:    General: Skin is warm and  dry.  Neurological:     Mental Status: She is alert and oriented to person, place, and time.     Cranial Nerves: No cranial nerve deficit.     Deep Tendon Reflexes: Reflexes are normal and symmetric.  Psychiatric:        Behavior: Behavior normal.        Thought Content: Thought content normal.        Judgment: Judgment normal.      BP 113/77   Pulse 66   Temp (!) 97.3 F (36.3 C) (Temporal)   Ht $R'5\' 7"'yr$  (1.702 m)   Wt 242 lb 3.2 oz (109.9 kg)   LMP 01/06/2017 (Exact Date)   BMI 37.93 kg/m      Assessment & Plan:  Carol Vargas comes in today with chief complaint of Follow-up   Diagnosis and orders addressed:  1. Pre-op evaluation - EKG 12-Lead - EKG 12-Lead - Bayer DCA Hb A1c Waived - Urinalysis, Complete - CMP14+EGFR - CBC with Differential/Platelet - Beta hCG quant (ref lab) - HIV Antibody (routine testing w rflx) - Thyroid Panel With TSH - PT AND PTT - DG Chest 2 View  2. Primary hypertension - CMP14+EGFR  3. Morbid obesity ([place])  - CMP14+EGFR  4. Weight loss counseling, encounter for   Labs pending Health Maintenance reviewed Diet and exercise  encouraged  Follow up plan: Keep chronic follow up   Evelina Dun, FNP

## 2022-02-24 LAB — CBC WITH DIFFERENTIAL/PLATELET
Basophils Absolute: 0.1 10*3/uL (ref 0.0–0.2)
Basos: 1 %
EOS (ABSOLUTE): 0.1 10*3/uL (ref 0.0–0.4)
Eos: 1 %
Hematocrit: 42 % (ref 34.0–46.6)
Hemoglobin: 14.1 g/dL (ref 11.1–15.9)
Immature Grans (Abs): 0 10*3/uL (ref 0.0–0.1)
Immature Granulocytes: 0 %
Lymphocytes Absolute: 2 10*3/uL (ref 0.7–3.1)
Lymphs: 31 %
MCH: 30.3 pg (ref 26.6–33.0)
MCHC: 33.6 g/dL (ref 31.5–35.7)
MCV: 90 fL (ref 79–97)
Monocytes Absolute: 0.5 10*3/uL (ref 0.1–0.9)
Monocytes: 8 %
Neutrophils Absolute: 3.8 10*3/uL (ref 1.4–7.0)
Neutrophils: 59 %
Platelets: 224 10*3/uL (ref 150–450)
RBC: 4.66 x10E6/uL (ref 3.77–5.28)
RDW: 12.4 % (ref 11.7–15.4)
WBC: 6.5 10*3/uL (ref 3.4–10.8)

## 2022-02-24 LAB — THYROID PANEL WITH TSH
Free Thyroxine Index: 2.1 (ref 1.2–4.9)
T3 Uptake Ratio: 25 % (ref 24–39)
T4, Total: 8.4 ug/dL (ref 4.5–12.0)
TSH: 1.28 u[IU]/mL (ref 0.450–4.500)

## 2022-02-24 LAB — CMP14+EGFR
ALT: 30 IU/L (ref 0–32)
AST: 25 IU/L (ref 0–40)
Albumin/Globulin Ratio: 1.5 (ref 1.2–2.2)
Albumin: 4.6 g/dL (ref 3.9–4.9)
Alkaline Phosphatase: 108 IU/L (ref 44–121)
BUN/Creatinine Ratio: 19 (ref 9–23)
BUN: 17 mg/dL (ref 6–24)
Bilirubin Total: 0.3 mg/dL (ref 0.0–1.2)
CO2: 27 mmol/L (ref 20–29)
Calcium: 9.9 mg/dL (ref 8.7–10.2)
Chloride: 103 mmol/L (ref 96–106)
Creatinine, Ser: 0.9 mg/dL (ref 0.57–1.00)
Globulin, Total: 3 g/dL (ref 1.5–4.5)
Glucose: 97 mg/dL (ref 70–99)
Potassium: 3.4 mmol/L — ABNORMAL LOW (ref 3.5–5.2)
Sodium: 149 mmol/L — ABNORMAL HIGH (ref 134–144)
Total Protein: 7.6 g/dL (ref 6.0–8.5)
eGFR: 79 mL/min/{1.73_m2} (ref >59)

## 2022-02-24 LAB — BETA HCG QUANT (REF LAB): hCG Quant: 1 m[IU]/mL

## 2022-02-24 LAB — PT AND PTT
INR: 1 (ref 0.9–1.2)
Prothrombin Time: 10.7 s (ref 9.1–12.0)
aPTT: 33 s (ref 24–33)

## 2022-02-24 LAB — HIV ANTIBODY (ROUTINE TESTING W REFLEX): HIV Screen 4th Generation wRfx: NONREACTIVE

## 2022-02-26 NOTE — Telephone Encounter (Signed)
Results sent please advise k+ rx question

## 2022-02-27 NOTE — Telephone Encounter (Signed)
Patient calls and states Dr. Vira Browns hadn't got her labs yet and wants to know if nurse can resend to F#[phone].

## 2022-03-26 ENCOUNTER — Telehealth: Payer: BC Managed Care – PPO | Admitting: Family Medicine

## 2022-03-26 ENCOUNTER — Encounter: Payer: Self-pay | Admitting: Family

## 2022-03-26 ENCOUNTER — Ambulatory Visit (INDEPENDENT_AMBULATORY_CARE_PROVIDER_SITE_OTHER): Payer: BC Managed Care – PPO | Admitting: Family

## 2022-03-26 VITALS — BP 143/99 | HR 77 | Temp 97.2°F | Ht 67.0 in | Wt 213.0 lb

## 2022-03-26 DIAGNOSIS — M542 Cervicalgia: Secondary | ICD-10-CM

## 2022-03-26 DIAGNOSIS — S161XXA Strain of muscle, fascia and tendon at neck level, initial encounter: Secondary | ICD-10-CM

## 2022-03-26 MED ORDER — METHYLPREDNISOLONE ACETATE 80 MG/ML IJ SUSP
80.0000 mg | Freq: Once | INTRAMUSCULAR | Status: AC
  Administered 2022-03-26: 80 mg via INTRAMUSCULAR

## 2022-03-26 MED ORDER — DICLOFENAC SODIUM 75 MG PO TBEC: 75.0000 mg | DELAYED_RELEASE_TABLET | Freq: Two times a day (BID) | ORAL | 0 refills | Status: DC

## 2022-03-26 MED ORDER — KETOROLAC TROMETHAMINE 60 MG/2ML IM SOLN
60.0000 mg | Freq: Once | INTRAMUSCULAR | Status: AC
  Administered 2022-03-26: 60 mg via INTRAMUSCULAR

## 2022-03-26 MED ORDER — BACLOFEN 10 MG PO TABS: 10.0000 mg | ORAL_TABLET | Freq: Three times a day (TID) | ORAL | 0 refills | Status: DC

## 2022-03-26 NOTE — Patient Instructions (Signed)
Cervical Sprain A cervical sprain is also called a neck sprain. It is a stretch or tear in one or more ligaments in the neck. Ligaments are tissues that connect bones to each other. Neck sprains can be mild, bad, or very bad. A very bad sprain in the neck can cause the bones in the neck to be unstable. This can damage the spinal cord. It can also cause serious problems in the brain, spinal cord, and nerves (nervous system). Most neck sprains heal in 4-6 weeks. It can take more or less time depending on: What caused the injury. The amount of injury. What are the causes? Neck sprains may be caused by trauma, such as: An injury from an accident in a vehicle such as a car or boat. A fall. The head and neck being moved front to back or side to side all of a sudden (whiplash injury). Mild neck sprains may be caused by wear and tear over time. What increases the risk? The following factors may make you more likely to develop this condition: Taking part in activities that put you at high risk of hurting your neck. These include: Contact sports. Car racing. Gymnastics. Diving. Taking risks when driving or riding in a vehicle such as a car or boat. Arthritis caused by wear and tear of the joints in the spine. The neck not being very strong or flexible. Having had a neck injury in the past. Poor posture. Spending a lot of time in certain positions that put stress on the neck. This may be from sitting at a computer for a long time. What are the signs or symptoms? Symptoms of this condition include: Your neck, shoulders, or upper back feeling: Painful or sore. Stiff. Tender. Swollen. Hot, or like it is burning. Sudden tightening of neck muscles (spasms). Not being able to move the neck very much. Headache. Feeling dizzy. Feeling like you may vomit, or vomiting. Having a hand or arm that: Feels weak. Loses feeling (feels numb). Tingles. You may get symptoms right away after injury, or you  may get them over a few days. In some cases, symptoms may go away with treatment and come back over time. How is this treated? This condition is treated by: Resting your neck. Icing the part of your neck that is hurt. Doing exercises to restore movement and strength to your neck (physical therapy). If there is no swelling, you may use heat therapy 2-3 days after the injury took place. If your injury is very bad, treatment may also include: Keeping your neck in place for a length of time. This may be done using: A neck collar. This supports your chin and the back of your head. A cervical traction device. This is a sling that holds up your head. The sling removes weight and pressure from your neck. It may also help to relieve pain. Medicines that help with: Pain. Irritation and swelling (inflammation). Medicines that help to relax your muscles (muscle relaxants). Surgery. This is rare. Follow these instructions at home: Medicines  Take over-the-counter and prescription medicines only as told by your doctor. Ask your doctor if the medicine prescribed to you: Requires you to avoid driving or using heavy machinery. Can cause trouble pooping (constipation). You may need to take these actions to prevent or treat trouble pooping: Drink enough fluid to keep your pee (urine) pale yellow. Take over-the-counter or prescription medicines. Eat foods that are high in fiber. These include beans, whole grains, and fresh fruits and vegetables. Limit   foods that are high in fat and sugar. These include fried or sweet foods. If you have a neck collar: Wear it as told by your doctor. Do not take it off unless told. Ask your doctor before adjusting your collar. If you have long hair, keep it outside of the collar. Ask your doctor if you may take off the collar for cleaning and bathing. If you may take off the collar: Follow instructions about how to take it off safely. Clean it by hand with mild soap and  water. Let it air-dry fully. If your collar has pads that you can take out: Take the pads out every 1-2 days. Wash them by hand with soap and water. Let the pads air-dry fully before you put them back in the collar. Tell your doctor if your skin under the collar has irritation or sores. Managing pain, stiffness, and swelling     Use a cervical traction device, if told by your doctor. If told, put ice on the affected area. To do this: Put ice in a plastic bag. Place a towel between your skin and the bag. Leave the ice on for 20 minutes, 2-3 times a day. If told, put heat on the affected area. Do this before exercise or as often as told by your doctor. Use the heat source that your doctor recommends, such as a moist heat pack or a heating pad. Place a towel between your skin and the heat source. Leave the heat on for 20-30 minutes. Take the heat off if your skin turns bright red. This is very important if you cannot feel pain, heat, or cold. You may have a greater risk of getting burned. Activity Do not drive while wearing a neck collar. If you do not have a neck collar, ask if it is safe to drive while your neck heals. Do not lift anything that is heavier than 10 lb (4.5 kg), or the limit that you are told, until your doctor tells you that it is safe. Rest as told by your doctor. Do exercises as told by your doctor or physical therapist. Return to your normal activities as told by your doctor. Avoid positions and activities that make you feel worse. Ask your doctor what activities are safe for you. General instructions Do not use any products that contain nicotine or tobacco, such as cigarettes, e-cigarettes, and chewing tobacco. These can delay healing. If you need help quitting, ask your doctor. Keep all follow-up visits as told by your doctor or physical therapist. This is important. How is this prevented? To prevent a neck sprain from happening again: Practice good posture. Adjust  your workstation to help you do this. Exercise regularly as told by your doctor or physical therapist. Avoid activities that are risky or may cause a neck sprain. Contact a doctor if: Your symptoms get worse. Your symptoms do not get better after 2 weeks of treatment. Your pain gets worse. Medicine does not help your pain. You have new symptoms that you cannot explain. Your neck collar gives you sores on your skin or bothers your skin. Get help right away if: You have very bad pain. You get any of the following in any part of your body: Loss of feeling. Tingling. Weakness. You cannot move a part of your body. You have neck pain and either of these: Very bad dizziness. A very bad headache. Summary A cervical sprain is also called a neck sprain. It is a stretch or tear in one or more   ligaments in the neck. Ligaments are tissues that connect bones. Neck sprains may be caused by trauma, such as an injury or a fall. You may get symptoms right away after injury, or you may get them over a few days. Neck sprains may be treated with rest, heat, ice, medicines, exercise, and surgery. This information is not intended to replace advice given to you by your health care provider. Make sure you discuss any questions you have with your health care provider. Document Revised: 10/02/2021 Document Reviewed: 03/04/2019 Elsevier Patient Education  2023 [employer] Inc[employer]  

## 2022-03-26 NOTE — Progress Notes (Signed)
Subjective:    Patient ID: Carol Vargas, female    DOB: Jul 25, 1975, 46 y.o.   MRN: [id number]  Chief Complaint  Patient presents with   Pain    Neck and back and both shoulder pains    PT presents to the office today with neck pain that started yesterday. Reports she picked up a bottle of laundry detergent and felt like she strain something.  Neck Pain  This is a new problem. The current episode started yesterday. The problem occurs intermittently. The problem has been waxing and waning. The pain is associated with nothing. The pain is present in the midline. The quality of the pain is described as aching. The pain is at a severity of 8/10. The pain is moderate. The symptoms are aggravated by twisting. Pertinent negatives include no fever, headaches, numbness, syncope, tingling or weight loss. She has tried acetaminophen and bed rest for the symptoms. The treatment provided mild relief.      Review of Systems  Constitutional:  Negative for fever and weight loss.  Cardiovascular:  Negative for syncope.  Musculoskeletal:  Positive for neck pain.  Neurological:  Negative for tingling, numbness and headaches.  All other systems reviewed and are negative.      Objective:   Physical Exam Vitals reviewed.  Constitutional:      General: She is not in acute distress.    Appearance: She is well-developed.  HENT:     Head: Normocephalic and atraumatic.     Right Ear: External ear normal.  Eyes:     Pupils: Pupils are equal, round, and reactive to light.  Neck:     Thyroid: No thyromegaly.  Cardiovascular:     Rate and Rhythm: Normal rate and regular rhythm.     Heart sounds: Normal heart sounds. No murmur heard. Pulmonary:     Effort: Pulmonary effort is normal. No respiratory distress.     Breath sounds: Normal breath sounds. No wheezing.  Abdominal:     General: Bowel sounds are normal. There is no distension.     Palpations: Abdomen is soft.     Tenderness: There is no  abdominal tenderness.  Musculoskeletal:        General: Tenderness present.     Cervical back: Normal range of motion and neck supple.     Comments: Pain in posterior neck with flexion or extension  Skin:    General: Skin is warm and dry.  Neurological:     Mental Status: She is alert and oriented to person, place, and time.     Cranial Nerves: No cranial nerve deficit.     Deep Tendon Reflexes: Reflexes are normal and symmetric.  Psychiatric:        Behavior: Behavior normal.        Thought Content: Thought content normal.        Judgment: Judgment normal.       BP (!) 143/99   Pulse 77   Temp (!) 97.2 F (36.2 C) (Temporal)   Ht '5\' 7"'$  (1.702 m)   Wt 213 lb (96.6 kg)   LMP 01/06/2017 (Exact Date)   SpO2 97%   BMI 33.36 kg/m      Assessment & Plan:  Carol Vargas comes in today with chief complaint of Pain (Neck and back and both shoulder pains )   Diagnosis and orders addressed:  1. Neck pain - methylPREDNISolone acetate (DEPO-MEDROL) injection 80 mg - ketorolac (TORADOL) injection 60 mg - baclofen (LIORESAL) 10 MG tablet;  Take 1 tablet (10 mg total) by mouth 3 (three) times daily.  Dispense: 30 each; Refill: 0 - diclofenac (VOLTAREN) 75 MG EC tablet; Take 1 tablet (75 mg total) by mouth 2 (two) times daily.  Dispense: 30 tablet; Refill: 0  2. Strain of neck muscle, initial encounter - methylPREDNISolone acetate (DEPO-MEDROL) injection 80 mg - ketorolac (TORADOL) injection 60 mg - baclofen (LIORESAL) 10 MG tablet; Take 1 tablet (10 mg total) by mouth 3 (three) times daily.  Dispense: 30 each; Refill: 0 - diclofenac (VOLTAREN) 75 MG EC tablet; Take 1 tablet (75 mg total) by mouth 2 (two) times daily.  Dispense: 30 tablet; Refill: 0  Rest Ice ROM exercises  Stop NSAID's 72 hours before surgery  Follow up if symptoms worsen or do not improve   Evelina Dun, FNP

## 2022-05-11 ENCOUNTER — Encounter: Payer: Self-pay | Admitting: Family

## 2022-05-11 ENCOUNTER — Ambulatory Visit (INDEPENDENT_AMBULATORY_CARE_PROVIDER_SITE_OTHER): Payer: BC Managed Care – PPO | Admitting: Family

## 2022-05-11 VITALS — BP 127/84 | HR 70 | Temp 97.0°F | Ht 67.0 in | Wt 227.0 lb

## 2022-05-11 DIAGNOSIS — I10 Essential (primary) hypertension: Secondary | ICD-10-CM

## 2022-05-11 DIAGNOSIS — Z981 Arthrodesis status: Secondary | ICD-10-CM

## 2022-05-11 DIAGNOSIS — Z0000 Encounter for general adult medical examination without abnormal findings: Secondary | ICD-10-CM | POA: Diagnosis not present

## 2022-05-11 DIAGNOSIS — E559 Vitamin D deficiency, unspecified: Secondary | ICD-10-CM

## 2022-05-11 DIAGNOSIS — E782 Mixed hyperlipidemia: Secondary | ICD-10-CM

## 2022-05-11 DIAGNOSIS — Z0001 Encounter for general adult medical examination with abnormal findings: Secondary | ICD-10-CM | POA: Diagnosis not present

## 2022-05-11 DIAGNOSIS — S161XXD Strain of muscle, fascia and tendon at neck level, subsequent encounter: Secondary | ICD-10-CM | POA: Diagnosis not present

## 2022-05-11 DIAGNOSIS — S161XXA Strain of muscle, fascia and tendon at neck level, initial encounter: Secondary | ICD-10-CM | POA: Diagnosis not present

## 2022-05-11 DIAGNOSIS — M542 Cervicalgia: Secondary | ICD-10-CM

## 2022-05-11 DIAGNOSIS — Z1211 Encounter for screening for malignant neoplasm of colon: Secondary | ICD-10-CM

## 2022-05-11 DIAGNOSIS — Z23 Encounter for immunization: Secondary | ICD-10-CM

## 2022-05-11 DIAGNOSIS — Z713 Dietary counseling and surveillance: Secondary | ICD-10-CM

## 2022-05-11 MED ORDER — DICLOFENAC SODIUM 75 MG PO TBEC: 75.0000 mg | DELAYED_RELEASE_TABLET | Freq: Two times a day (BID) | ORAL | 0 refills | Status: DC

## 2022-05-11 MED ORDER — BACLOFEN 10 MG PO TABS: 10.0000 mg | ORAL_TABLET | Freq: Three times a day (TID) | ORAL | 0 refills | Status: DC

## 2022-05-11 MED ORDER — AMLODIPINE BESYLATE 10 MG PO TABS: 10.0000 mg | ORAL_TABLET | Freq: Every day | ORAL | 2 refills | Status: DC

## 2022-05-11 MED ORDER — LOSARTAN POTASSIUM-HCTZ 100-25 MG PO TABS: 1.0000 | ORAL_TABLET | Freq: Every day | ORAL | 3 refills | Status: DC

## 2022-05-11 MED ORDER — PHENTERMINE HCL 37.5 MG PO TABS: 37.5000 mg | ORAL_TABLET | Freq: Every day | ORAL | 2 refills | Status: DC

## 2022-05-11 NOTE — Progress Notes (Addendum)
Subjective:    Patient ID: Carol Vargas, female    DOB: [birth year], 46 y.o.   MRN: [id number]  Chief Complaint  Patient presents with   Annual Exam   PT presents to the office today for CPE. She had a tummy tuck on 04/03/22.  She is taking phentermine and has lost  15lb.     05/11/2022   10:45 AM 03/26/2022   10:51 AM 02/23/2022    9:25 AM  Last 3 Weights  Weight (lbs) 227 lb 213 lb 242 lb 3.2 oz  Weight (kg) 102.967 kg 96.616 kg 109.861 kg    She is morbid obese with a BMI of 35 with HTN and hyperlipidemia.  Hypertension This is a chronic problem. The current episode started more than 1 year ago. The problem has been resolved since onset. The problem is controlled. Pertinent negatives include no malaise/fatigue, peripheral edema or shortness of breath. Risk factors for coronary artery disease include dyslipidemia, obesity and sedentary lifestyle. The current treatment provides moderate improvement.  Hyperlipidemia This is a chronic problem. The current episode started more than 1 year ago. The problem is controlled. Recent lipid tests were reviewed and are normal. Exacerbating diseases include obesity. Pertinent negatives include no shortness of breath. Current antihyperlipidemic treatment includes statins. The current treatment provides moderate improvement of lipids.  Back Pain This is a chronic problem. The current episode started more than 1 year ago. The problem occurs intermittently. The problem has been resolved since onset. The pain is at a severity of 0/10 (with the diclofenac). The patient is experiencing no pain.      Review of Systems  Constitutional:  Negative for malaise/fatigue.  Respiratory:  Negative for shortness of breath.   Musculoskeletal:  Positive for back pain.  All other systems reviewed and are negative.  Family History  Problem Relation Age of Onset   Hypertension Mother    Diabetes Mellitus II Mother    Osteoporosis Mother    Fibroids Mother     Diabetes Mother    Hyperlipidemia Mother    Obesity Mother    Stroke Father        died from this   Hypertension Father    Lung cancer Maternal Aunt    Fibroids Sister    Social History   Socioeconomic History   Marital status: Divorced    Spouse name: Not on file   Number of children: Not on file   Years of education: Not on file   Highest education level: Not on file  Occupational History   Not on file  Tobacco Use   Smoking status: Former    Packs/day: 0.50    Years: 5.00    Total pack years: 2.50    Types: Cigarettes    Quit date: 02/06/2013    Years since quitting: 9.2   Smokeless tobacco: Never  Vaping Use   Vaping Use: Never used  Substance and Sexual Activity   Alcohol use: No   Drug use: No   Sexual activity: Not Currently    Birth control/protection: Surgical    Comment: hyst  Other Topics Concern   Not on file  Social History Narrative   Not on file   Social Determinants of Health   Financial Resource Strain: Not on file  Food Insecurity: Not on file  Transportation Needs: Not on file  Physical Activity: Not on file  Stress: Not on file  Social Connections: Not on file       Objective:  Physical Exam Vitals reviewed.  Constitutional:      General: She is not in acute distress.    Appearance: She is well-developed. She is obese.  HENT:     Head: Normocephalic and atraumatic.     Right Ear: Tympanic membrane normal.     Left Ear: Tympanic membrane normal.  Eyes:     Pupils: Pupils are equal, round, and reactive to light.  Neck:     Thyroid: No thyromegaly.  Cardiovascular:     Rate and Rhythm: Normal rate and regular rhythm.     Heart sounds: Normal heart sounds. No murmur heard. Pulmonary:     Effort: Pulmonary effort is normal. No respiratory distress.     Breath sounds: Normal breath sounds. No wheezing.  Abdominal:     General: Bowel sounds are normal. There is no distension.     Palpations: Abdomen is soft.     Tenderness: There  is no abdominal tenderness.  Musculoskeletal:        General: No tenderness. Normal range of motion.     Cervical back: Normal range of motion and neck supple.  Skin:    General: Skin is warm and dry.  Neurological:     Mental Status: She is alert and oriented to person, place, and time.     Cranial Nerves: No cranial nerve deficit.     Deep Tendon Reflexes: Reflexes are normal and symmetric.  Psychiatric:        Behavior: Behavior normal.        Thought Content: Thought content normal.        Judgment: Judgment normal.          BP 127/84   Pulse 70   Temp (!) 97 F (36.1 C) (Temporal)   Ht _0  (1.702 m)   Wt 227 lb (103 kg)   LMP 01/06/2017 (Exact Date)   BMI 35.55 kg/m   Assessment & Plan:  Carol Vargas comes in today with chief complaint of Annual Exam   Diagnosis and orders addressed:  1. Primary hypertension - amLODipine (NORVASC) 10 MG tablet; Take 1 tablet (10 mg total) by mouth daily.  Dispense: 90 tablet; Refill: 2 - losartan-hydrochlorothiazide (HYZAAR) 100-25 MG tablet; Take 1 tablet by mouth daily.  Dispense: 90 tablet; Refill: 3 - CMP14+EGFR - CBC with Differential/Platelet  2. Neck pain - baclofen (LIORESAL) 10 MG tablet; Take 1 tablet (10 mg total) by mouth 3 (three) times daily.  Dispense: 30 each; Refill: 0 - diclofenac (VOLTAREN) 75 MG EC tablet; Take 1 tablet (75 mg total) by mouth 2 (two) times daily.  Dispense: 30 tablet; Refill: 0 - CMP14+EGFR - CBC with Differential/Platelet  3. Strain of neck muscle, initial encounter  - baclofen (LIORESAL) 10 MG tablet; Take 1 tablet (10 mg total) by mouth 3 (three) times daily.  Dispense: 30 each; Refill: 0 - diclofenac (VOLTAREN) 75 MG EC tablet; Take 1 tablet (75 mg total) by mouth 2 (two) times daily.  Dispense: 30 tablet; Refill: 0 - CMP14+EGFR - CBC with Differential/Platelet  4. Morbid obesity ([facility]) - phentermine (ADIPEX-P) 37.5 MG tablet; Take 1 tablet (37.5 mg total) by mouth daily before  breakfast.  Dispense: 30 tablet; Refill: 2 - CMP14+EGFR - CBC with Differential/Platelet  5. Weight loss counseling, encounter for - phentermine (ADIPEX-P) 37.5 MG tablet; Take 1 tablet (37.5 mg total) by mouth daily before breakfast.  Dispense: 30 tablet; Refill: 2 - CMP14+EGFR - CBC with Differential/Platelet  6. Mixed hyperlipidemia - CMP14+EGFR -  CBC with Differential/Platelet  7. Need for immunization against influenza - Flu Vaccine QUAD 25moIM (Fluarix, Fluzone & Alfiuria Quad PF) - CMP14+EGFR - CBC with Differential/Platelet  8. Annual physical exam - CMP14+EGFR - CBC with Differential/Platelet - Lipid panel - TSH - VITAMIN D 25 Hydroxy (Vit-D Deficiency, Fractures)  9. Vitamin D deficiency  - CMP14+EGFR - CBC with Differential/Platelet - VITAMIN D 25 Hydroxy (Vit-D Deficiency, Fractures)  10. S/P lumbar fusion - CMP14+EGFR - CBC with Differential/Platelet  11. Colon cancer screening - Ambulatory referral to Gastroenterology - CMP14+EGFR - CBC with Differential/Platelet   Labs pending Health Maintenance reviewed Diet and exercise encouraged  Follow up plan: 6 months   CEvelina Dun FNP

## 2022-05-11 NOTE — Patient Instructions (Signed)

## 2022-05-12 LAB — CBC WITH DIFFERENTIAL/PLATELET
Basophils Absolute: 0 10*3/uL (ref 0.0–0.2)
Basos: 1 %
EOS (ABSOLUTE): 0.1 10*3/uL (ref 0.0–0.4)
Eos: 2 %
Hematocrit: 40 % (ref 34.0–46.6)
Hemoglobin: 13.6 g/dL (ref 11.1–15.9)
Immature Grans (Abs): 0 10*3/uL (ref 0.0–0.1)
Immature Granulocytes: 0 %
Lymphocytes Absolute: 1.4 10*3/uL (ref 0.7–3.1)
Lymphs: 30 %
MCH: 31 pg (ref 26.6–33.0)
MCHC: 34 g/dL (ref 31.5–35.7)
MCV: 91 fL (ref 79–97)
Monocytes Absolute: 0.4 10*3/uL (ref 0.1–0.9)
Monocytes: 9 %
Neutrophils Absolute: 2.8 10*3/uL (ref 1.4–7.0)
Neutrophils: 58 %
Platelets: 240 10*3/uL (ref 150–450)
RBC: 4.39 x10E6/uL (ref 3.77–5.28)
RDW: 12.5 % (ref 11.7–15.4)
WBC: 4.7 10*3/uL (ref 3.4–10.8)

## 2022-05-12 LAB — LIPID PANEL
Chol/HDL Ratio: 3.9 ratio (ref 0.0–4.4)
Cholesterol, Total: 233 mg/dL — ABNORMAL HIGH (ref 100–199)
HDL: 60 mg/dL (ref >39)
LDL Chol Calc (NIH): 154 mg/dL — ABNORMAL HIGH (ref 0–99)
Triglycerides: 106 mg/dL (ref 0–149)
VLDL Cholesterol Cal: 19 mg/dL (ref 5–40)

## 2022-05-12 LAB — CMP14+EGFR
ALT: 19 IU/L (ref 0–32)
AST: 20 IU/L (ref 0–40)
Albumin/Globulin Ratio: 1.4 (ref 1.2–2.2)
Albumin: 4.2 g/dL (ref 3.9–4.9)
Alkaline Phosphatase: 104 IU/L (ref 44–121)
BUN/Creatinine Ratio: 17 (ref 9–23)
BUN: 14 mg/dL (ref 6–24)
Bilirubin Total: 0.4 mg/dL (ref 0.0–1.2)
CO2: 26 mmol/L (ref 20–29)
Calcium: 9.5 mg/dL (ref 8.7–10.2)
Chloride: 102 mmol/L (ref 96–106)
Creatinine, Ser: 0.82 mg/dL (ref 0.57–1.00)
Globulin, Total: 3.1 g/dL (ref 1.5–4.5)
Glucose: 97 mg/dL (ref 70–99)
Potassium: 3.5 mmol/L (ref 3.5–5.2)
Sodium: 142 mmol/L (ref 134–144)
Total Protein: 7.3 g/dL (ref 6.0–8.5)
eGFR: 88 mL/min/{1.73_m2} (ref >59)

## 2022-05-12 LAB — VITAMIN D 25 HYDROXY (VIT D DEFICIENCY, FRACTURES): Vit D, 25-Hydroxy: 34.6 ng/mL (ref 30.0–100.0)

## 2022-05-12 LAB — TSH: TSH: 0.619 u[IU]/mL (ref 0.450–4.500)

## 2022-05-16 ENCOUNTER — Encounter: Payer: Self-pay | Admitting: *Deleted

## 2022-07-04 ENCOUNTER — Other Ambulatory Visit: Payer: Self-pay | Admitting: Family

## 2022-07-04 DIAGNOSIS — M542 Cervicalgia: Secondary | ICD-10-CM

## 2022-07-04 DIAGNOSIS — S161XXA Strain of muscle, fascia and tendon at neck level, initial encounter: Secondary | ICD-10-CM

## 2022-07-05 ENCOUNTER — Other Ambulatory Visit (HOSPITAL_COMMUNITY): Payer: Self-pay

## 2022-07-18 ENCOUNTER — Ambulatory Visit: Payer: BC Managed Care – PPO | Admitting: Family Medicine

## 2022-07-25 ENCOUNTER — Encounter: Payer: Self-pay | Admitting: Family

## 2022-07-26 ENCOUNTER — Encounter: Payer: Self-pay | Admitting: Family Medicine

## 2022-07-26 ENCOUNTER — Telehealth (INDEPENDENT_AMBULATORY_CARE_PROVIDER_SITE_OTHER): Payer: BC Managed Care – PPO | Admitting: Family Medicine

## 2022-07-26 DIAGNOSIS — U071 COVID-19: Secondary | ICD-10-CM | POA: Diagnosis not present

## 2022-07-26 MED ORDER — MOLNUPIRAVIR EUA 200MG CAPSULE: 4.0000 | ORAL_CAPSULE | Freq: Two times a day (BID) | ORAL | 0 refills | Status: AC

## 2022-07-26 MED ORDER — PROMETHAZINE-DM 6.25-15 MG/5ML PO SYRP: 2.5000 mL | ORAL_SOLUTION | Freq: Four times a day (QID) | ORAL | 0 refills | Status: DC | PRN

## 2022-07-26 MED ORDER — BENZONATATE 100 MG PO CAPS: 100.0000 mg | ORAL_CAPSULE | Freq: Three times a day (TID) | ORAL | 0 refills | Status: DC | PRN

## 2022-07-26 NOTE — Progress Notes (Signed)
Telephone visit  Subjective: CC: COVID PCP: Sharion Balloon, FNP QIW:[id number] Werntz is a 47 y.o. female calls for telephone consult today. Patient provides verbal consent for consult held via phone.  Due to COVID-19 pandemic this visit was conducted virtually. This visit type was conducted due to national recommendations for restrictions regarding the COVID-19 Pandemic (e.g. social distancing, sheltering in place) in an effort to limit this patient's exposure and mitigate transmission in our community. All issues noted in this document were discussed and addressed.  A physical exam was not performed with this format.   Location of patient: home Location of provider: [facility] Others present for call: none  1. COVID Patient reports she tested positive at work last night x2.  Her son is sick with COVID as well.  She reports feeling nauseated, achy, congestion.  She reports that her eyes are itching and irritated.  She is using Nyquil and Tylenol.  She reports cough.  No hemoptysis.  No shortness of breath or wheezing.  She is hydrating. She reports diarrhea but no vomiting reported.  She took imodium for that.  LMP: hysterectomy.   ROS: Per HPI  Allergies  Allergen Reactions   Lisinopril Cough   Past Medical History:  Diagnosis Date   Arthritis    Back pain    Bilateral swelling of feet    Fibroids    Gallstone 2021   Headache(784.0)    at times, nothing severe per pt.menstral   High cholesterol    Hypertension    Joint pain    Sjogrens syndrome (HCC)    Vitamin D deficiency     Current Outpatient Medications:    amLODipine (NORVASC) 10 MG tablet, Take 1 tablet (10 mg total) by mouth daily., Disp: 90 tablet, Rfl: 2   baclofen (LIORESAL) 10 MG tablet, Take 1 tablet (10 mg total) by mouth 3 (three) times daily., Disp: 30 each, Rfl: 0   diclofenac (VOLTAREN) 75 MG EC tablet, TAKE 1 TABLET BY MOUTH TWICE A DAY, Disp: 30 tablet, Rfl: 2   losartan-hydrochlorothiazide (HYZAAR) 100-25 MG  tablet, Take 1 tablet by mouth daily., Disp: 90 tablet, Rfl: 3   phentermine (ADIPEX-P) 37.5 MG tablet, Take 1 tablet (37.5 mg total) by mouth daily before breakfast., Disp: 30 tablet, Rfl: 2  Assessment/ Plan: 47 y.o. female   COVID-19 - Plan: molnupiravir EUA (LAGEVRIO) 200 mg CAPS capsule, benzonatate (TESSALON PERLES) 100 MG capsule, promethazine-dextromethorphan (PROMETHAZINE-DM) 6.25-15 MG/5ML syrup  Molnupiravir sent. Cough meds sent. Caution sedation.  Discussed reasons for reevaluation and emergent evaluation.  Work note provided.  Follow up prn  Start time: 11:56pm End time: 12:02pm  Total time spent on patient care (including telephone call/ virtual visit): 6 minutes  [place], Roxana [phone]

## 2022-08-11 ENCOUNTER — Other Ambulatory Visit: Payer: Self-pay | Admitting: Family

## 2022-08-11 DIAGNOSIS — M542 Cervicalgia: Secondary | ICD-10-CM

## 2022-08-11 DIAGNOSIS — S161XXA Strain of muscle, fascia and tendon at neck level, initial encounter: Secondary | ICD-10-CM

## 2022-09-05 ENCOUNTER — Other Ambulatory Visit: Payer: Self-pay | Admitting: Family

## 2022-09-05 DIAGNOSIS — S161XXA Strain of muscle, fascia and tendon at neck level, initial encounter: Secondary | ICD-10-CM

## 2022-09-05 DIAGNOSIS — M542 Cervicalgia: Secondary | ICD-10-CM

## 2022-09-06 ENCOUNTER — Institutional Professional Consult (permissible substitution): Payer: BC Managed Care – PPO | Admitting: Plastic Surgery

## 2022-11-05 DIAGNOSIS — M79605 Pain in left leg: Secondary | ICD-10-CM | POA: Diagnosis not present

## 2022-11-05 DIAGNOSIS — M25561 Pain in right knee: Secondary | ICD-10-CM | POA: Diagnosis not present

## 2022-11-05 DIAGNOSIS — M79604 Pain in right leg: Secondary | ICD-10-CM | POA: Diagnosis not present

## 2022-11-05 DIAGNOSIS — R600 Localized edema: Secondary | ICD-10-CM | POA: Diagnosis not present

## 2022-11-05 DIAGNOSIS — M79661 Pain in right lower leg: Secondary | ICD-10-CM | POA: Diagnosis not present

## 2022-11-12 ENCOUNTER — Ambulatory Visit: Payer: BC Managed Care – PPO | Admitting: Family

## 2022-11-12 ENCOUNTER — Encounter: Payer: Self-pay | Admitting: Family

## 2022-11-12 ENCOUNTER — Ambulatory Visit (INDEPENDENT_AMBULATORY_CARE_PROVIDER_SITE_OTHER): Payer: BC Managed Care – PPO

## 2022-11-12 VITALS — BP 119/79 | HR 80 | Temp 97.7°F | Ht 66.0 in | Wt 211.0 lb

## 2022-11-12 DIAGNOSIS — Z01818 Encounter for other preprocedural examination: Secondary | ICD-10-CM

## 2022-11-12 DIAGNOSIS — E782 Mixed hyperlipidemia: Secondary | ICD-10-CM | POA: Diagnosis not present

## 2022-11-12 DIAGNOSIS — I10 Essential (primary) hypertension: Secondary | ICD-10-CM | POA: Diagnosis not present

## 2022-11-12 LAB — BAYER DCA HB A1C WAIVED: HB A1C (BAYER DCA - WAIVED): 5.4 % (ref 4.8–5.6)

## 2022-11-12 LAB — COAGUCHEK XS/INR WAIVED
INR: 1 (ref 0.9–1.1)
Prothrombin Time: 12.1 s

## 2022-11-12 LAB — MICROSCOPIC EXAMINATION
Bacteria, UA: NONE SEEN
Renal Epithel, UA: NONE SEEN /hpf
WBC, UA: NONE SEEN /hpf (ref 0–5)

## 2022-11-12 LAB — URINALYSIS, COMPLETE
Bilirubin, UA: NEGATIVE
Glucose, UA: NEGATIVE
Ketones, UA: NEGATIVE
Leukocytes,UA: NEGATIVE
Nitrite, UA: NEGATIVE
Protein,UA: NEGATIVE
RBC, UA: NEGATIVE
Specific Gravity, UA: >1.03 — ABNORMAL HIGH (ref 1.005–1.030)
Urobilinogen, Ur: 0.2 mg/dL (ref 0.2–1.0)
pH, UA: 5.5 (ref 5.0–7.5)

## 2022-11-12 LAB — APTT

## 2022-11-12 LAB — THYROID PANEL WITH TSH

## 2022-11-12 MED ORDER — LOSARTAN POTASSIUM-HCTZ 100-25 MG PO TABS: 1.0000 | ORAL_TABLET | Freq: Every day | ORAL | 3 refills | Status: DC

## 2022-11-12 NOTE — Progress Notes (Signed)
Subjective:    Patient ID: Carol Vargas, female    DOB: [birth year], 47 y.o.   MRN: [id number]  Chief Complaint  Patient presents with   surgical clearance    PT presents to the office today for surgical clearance. She is scheduled for Lipo. She has forms to be completed.  Hypertension This is a chronic problem. The current episode started more than 1 year ago. The problem has been resolved since onset. The problem is controlled. Pertinent negatives include no malaise/fatigue, peripheral edema or shortness of breath. Risk factors for coronary artery disease include dyslipidemia and obesity. The current treatment provides moderate improvement.  Hyperlipidemia This is a chronic problem. The current episode started more than 1 year ago. Exacerbating diseases include obesity. Pertinent negatives include no shortness of breath. Current antihyperlipidemic treatment includes diet change. The current treatment provides mild improvement of lipids.      Review of Systems  Constitutional:  Negative for malaise/fatigue.  Respiratory:  Negative for shortness of breath.   All other systems reviewed and are negative.      Objective:   Physical Exam Vitals reviewed.  Constitutional:      General: She is not in acute distress.    Appearance: She is well-developed.  HENT:     Head: Normocephalic and atraumatic.     Right Ear: Tympanic membrane normal.     Left Ear: Tympanic membrane normal.  Eyes:     Pupils: Pupils are equal, round, and reactive to light.  Neck:     Thyroid: No thyromegaly.  Cardiovascular:     Rate and Rhythm: Normal rate and regular rhythm.     Heart sounds: Normal heart sounds. No murmur heard. Pulmonary:     Effort: Pulmonary effort is normal. No respiratory distress.     Breath sounds: Normal breath sounds. No wheezing.  Abdominal:     General: Bowel sounds are normal. There is no distension.     Palpations: Abdomen is soft.     Tenderness: There is no abdominal  tenderness.  Musculoskeletal:        General: No tenderness. Normal range of motion.     Cervical back: Normal range of motion and neck supple.  Skin:    General: Skin is warm and dry.  Neurological:     Mental Status: She is alert and oriented to person, place, and time.     Cranial Nerves: No cranial nerve deficit.     Deep Tendon Reflexes: Reflexes are normal and symmetric.  Psychiatric:        Behavior: Behavior normal.        Thought Content: Thought content normal.        Judgment: Judgment normal.       BP 119/79   Pulse 80   Temp 97.7 F (36.5 C) (Temporal)   Ht 5\' 6"  (1.676 m)   Wt 211 lb (95.7 kg)   LMP 01/06/2017 (Exact Date)   SpO2 100%   BMI 34.06 kg/m      Assessment & Plan:  [name] comes in today with chief complaint of surgical clearance    Diagnosis and orders addressed:  1. Primary hypertension - losartan-hydrochlorothiazide (HYZAAR) 100-25 MG tablet; Take 1 tablet by mouth daily.  Dispense: 90 tablet; Refill: 3 - EKG 12-Lead  2. Pre-op evaluation - Urinalysis, Complete - CBC with Differential/Platelet - CMP14+EGFR - Beta hCG quant (ref lab) - HIV Antibody (routine testing w rflx) - CoaguChek XS/INR Waived - APTT - Thyroid Panel  With TSH - Acute Viral Hepatitis (HAV, HBV, HCV) - Bayer DCA Hb A1c Waived - DG Chest 2 View  3. Mixed hyperlipidemia   Labs pending Health Maintenance reviewed Diet and exercise encouraged  Follow up plan: Keep chronic follow up   Jannifer Rodney, FNP

## 2022-11-12 NOTE — Patient Instructions (Signed)
Liposuction Liposuction (lipoplasty) is a procedure to remove fat from under your skin. It is often done to get rid of fat from your: Calves or ankles. Thighs or butt. Abdomen. Upper arms. Neck or face. You may choose to have this procedure done if you want to slim or reshape areas of your body. It can also be done to remove cells from your armpits. This can help you not sweat as much. Tell your health care provider about: Any allergies you have. All medicines you are taking, including vitamins, herbs, eye drops, creams, and over-the-counter medicines. Any problems you or family members have had with anesthesia. Any bleeding problems you have. Any surgeries you have had. Any medical conditions you have. The last time you used tobacco or nicotine. Whether you are pregnant or may be pregnant. What are the risks? Your health care provider will talk with you about risks. These may include: Bleeding. Infection. Bruising. Scarring. Numbness. Allergic reactions to medicines or dyes. Damage to nearby structures or organs. Other risks may include: Lumpy, sagging, or rippled skin. Changes in the color of the skin. Pockets of fluid under the skin. What happens before the procedure? When to stop eating and drinking Follow instructions from your provider about what you may eat and drink. These may include: 8 hours before your procedure Stop eating most foods. Do not eat meat, fried foods, or fatty foods. Eat only light foods, such as toast or crackers. All liquids are okay except energy drinks and alcohol. 6 hours before your procedure Stop eating. Drink only clear liquids, such as water, clear fruit juice, black coffee, plain tea, and sports drinks. Do not drink energy drinks or alcohol. 2 hours before your procedure Stop drinking all liquids. You may be allowed to take medicines with small sips of water. If you do not follow your provider's instructions, your procedure may be delayed  or canceled. Medicines Ask your provider about: Changing or stopping your regular medicines. These include any diabetes medicines or blood thinners you take. Taking medicines such as aspirin and ibuprofen. These medicines can thin your blood. Do not take them unless your provider tells you to. Taking over-the-counter medicines, vitamins, herbs, and supplements. Surgery safety Ask your provider: How your surgery site will be marked. What steps will be taken to help prevent infection. These steps may include: Removing hair at the surgery site. Washing skin with a soap that kills germs. Taking antibiotics. General instructions Do not use any products that contain nicotine or tobacco for at least 4 weeks before the procedure. These products include cigarettes, chewing tobacco, and vaping devices, such as e-cigarettes. If you need help quitting, ask your provider. You may have an exam, blood tests, and a heart test called an electrocardiogram (ECG) before your procedure. If you will be going home right after the procedure, plan to have a responsible adult: Take you home from the hospital. You will not be allowed to drive. Care for you for the time you are told. What happens during the procedure?  An IV will be inserted into one of your veins. You may be given: A sedative. This helps you relax. Anesthesia. This keeps you from feeling pain. It will make you fall asleep for surgery. Small incisions will be made in your skin. A thin, hollow tube (cannula) will be put through an incision. You will be given a saline solution through the tube. This solution is made of salt and water. You will also be given numbing medicine and medicine  to reduce bleeding. The tube will be moved back and forth to break up fat cells. In some cases, sound waves (ultrasound) or lasers will also be used. A suction device will be attached to the tube. It will be used to remove fluid and fat cells from under your  skin. You may be given more fluids through an IV. This may be done to replace fluids lost during the procedure. Your incisions will be closed with stitches (sutures), skin glue, or tape strips. Gauze pads or a bandage (dressing) will be placed over the incisions. A compression garment or elastic bandage will be placed over or around the area. The procedure may vary among providers and hospitals. What happens after the procedure? Your blood pressure, heart rate, breathing rate, and blood oxygen level will be monitored until you leave the hospital or clinic. If you were given a sedative during the procedure, it can affect you for several hours. Do not drive or operate machinery until your provider says that it is safe. Wear compression stockings as told by your provider. You may need to wear them along with other compression garments. The stockings help to prevent blood clots and reduce swelling in your legs. Where to find more information [employer] (ASPS): plasticsurgery.org This information is not intended to replace advice given to you by your health care provider. Make sure you discuss any questions you have with your health care provider. Document Revised: 03/27/2022 Document Reviewed: 03/27/2022 Elsevier Patient Education  2023 [employer].

## 2022-11-13 LAB — CBC WITH DIFFERENTIAL/PLATELET
Basophils Absolute: 0 10*3/uL (ref 0.0–0.2)
Basos: 1 %
EOS (ABSOLUTE): 0.1 10*3/uL (ref 0.0–0.4)
Eos: 1 %
Hematocrit: 45.6 % (ref 34.0–46.6)
Hemoglobin: 15.1 g/dL (ref 11.1–15.9)
Immature Grans (Abs): 0 10*3/uL (ref 0.0–0.1)
Immature Granulocytes: 0 %
Lymphocytes Absolute: 1.8 10*3/uL (ref 0.7–3.1)
Lymphs: 37 %
MCH: 30.8 pg (ref 26.6–33.0)
MCHC: 33.1 g/dL (ref 31.5–35.7)
MCV: 93 fL (ref 79–97)
Monocytes Absolute: 0.4 10*3/uL (ref 0.1–0.9)
Monocytes: 8 %
Neutrophils Absolute: 2.6 10*3/uL (ref 1.4–7.0)
Neutrophils: 53 %
Platelets: 245 10*3/uL (ref 150–450)
RBC: 4.91 x10E6/uL (ref 3.77–5.28)
RDW: 12.9 % (ref 11.7–15.4)
WBC: 5 10*3/uL (ref 3.4–10.8)

## 2022-11-13 LAB — CMP14+EGFR
ALT: 23 IU/L (ref 0–32)
AST: 20 IU/L (ref 0–40)
Albumin/Globulin Ratio: 1.8 (ref 1.2–2.2)
Albumin: 5 g/dL — ABNORMAL HIGH (ref 3.9–4.9)
Alkaline Phosphatase: 108 IU/L (ref 44–121)
BUN/Creatinine Ratio: 22 (ref 9–23)
BUN: 21 mg/dL (ref 6–24)
Bilirubin Total: 0.4 mg/dL (ref 0.0–1.2)
CO2: 25 mmol/L (ref 20–29)
Calcium: 10.1 mg/dL (ref 8.7–10.2)
Chloride: 100 mmol/L (ref 96–106)
Creatinine, Ser: 0.96 mg/dL (ref 0.57–1.00)
Globulin, Total: 2.8 g/dL (ref 1.5–4.5)
Glucose: 85 mg/dL (ref 70–99)
Potassium: 3.8 mmol/L (ref 3.5–5.2)
Sodium: 143 mmol/L (ref 134–144)
Total Protein: 7.8 g/dL (ref 6.0–8.5)
eGFR: 73 mL/min/{1.73_m2} (ref >59)

## 2022-11-13 LAB — BETA HCG QUANT (REF LAB): hCG Quant: <1 m[IU]/mL

## 2022-11-13 LAB — ACUTE VIRAL HEPATITIS (HAV, HBV, HCV)
HCV Ab: NONREACTIVE
Hep A IgM: NEGATIVE
Hep B C IgM: NEGATIVE
Hepatitis B Surface Ag: NEGATIVE

## 2022-11-13 LAB — THYROID PANEL WITH TSH
Free Thyroxine Index: 2.5 (ref 1.2–4.9)
T3 Uptake Ratio: 26 % (ref 24–39)
TSH: 1.11 u[IU]/mL (ref 0.450–4.500)

## 2022-11-13 LAB — HCV INTERPRETATION

## 2022-11-13 LAB — HIV ANTIBODY (ROUTINE TESTING W REFLEX): HIV Screen 4th Generation wRfx: NONREACTIVE

## 2022-11-21 DIAGNOSIS — I87391 Chronic venous hypertension (idiopathic) with other complications of right lower extremity: Secondary | ICD-10-CM | POA: Diagnosis not present

## 2022-11-21 DIAGNOSIS — R252 Cramp and spasm: Secondary | ICD-10-CM | POA: Diagnosis not present

## 2022-11-21 DIAGNOSIS — M7989 Other specified soft tissue disorders: Secondary | ICD-10-CM | POA: Diagnosis not present

## 2022-11-21 DIAGNOSIS — M79604 Pain in right leg: Secondary | ICD-10-CM | POA: Diagnosis not present

## 2022-11-21 DIAGNOSIS — M79661 Pain in right lower leg: Secondary | ICD-10-CM | POA: Diagnosis not present

## 2022-11-21 DIAGNOSIS — R600 Localized edema: Secondary | ICD-10-CM | POA: Diagnosis not present

## 2022-12-13 ENCOUNTER — Other Ambulatory Visit: Payer: Self-pay | Admitting: Family

## 2022-12-13 DIAGNOSIS — Z713 Dietary counseling and surveillance: Secondary | ICD-10-CM

## 2023-01-17 ENCOUNTER — Ambulatory Visit: Payer: BC Managed Care – PPO | Admitting: Family

## 2023-01-17 ENCOUNTER — Encounter: Payer: Self-pay | Admitting: Family

## 2023-01-17 VITALS — BP 138/92 | HR 66 | Temp 97.4°F | Ht 66.0 in | Wt 212.0 lb

## 2023-01-17 DIAGNOSIS — I10 Essential (primary) hypertension: Secondary | ICD-10-CM | POA: Diagnosis not present

## 2023-01-17 DIAGNOSIS — E669 Obesity, unspecified: Secondary | ICD-10-CM

## 2023-01-17 DIAGNOSIS — Z713 Dietary counseling and surveillance: Secondary | ICD-10-CM

## 2023-01-17 DIAGNOSIS — E782 Mixed hyperlipidemia: Secondary | ICD-10-CM

## 2023-01-17 DIAGNOSIS — I872 Venous insufficiency (chronic) (peripheral): Secondary | ICD-10-CM

## 2023-01-17 MED ORDER — PHENTERMINE HCL 37.5 MG PO TABS: 37.5000 mg | ORAL_TABLET | Freq: Every day | ORAL | 2 refills | Status: DC

## 2023-01-17 NOTE — Progress Notes (Signed)
Subjective:    Patient ID: Carol Vargas, female    DOB: 04-07-1976, 47 y.o.   MRN: [id number]  Chief Complaint  Patient presents with   Medical Management of Chronic Issues   PT presents to the office today for chronic follow up. She had a tummy tuck on 04/03/22.   She is taking phentermine and has lost  59 lb over the last year. She is going to the gym 2-3 times a week for 30 mins.      01/17/2023   10:27 AM 11/12/2022    8:07 AM 05/11/2022   10:45 AM  Last 3 Weights  Weight (lbs) 212 lb 211 lb 227 lb  Weight (kg) 96.163 kg 95.709 kg 102.967 kg    She is followed by [facility] for chronic venous insufficiency.  Hypertension This is a chronic problem. The current episode started more than 1 year ago. The problem is uncontrolled. Associated symptoms include peripheral edema. Pertinent negatives include no malaise/fatigue or shortness of breath. Risk factors for coronary artery disease include dyslipidemia, obesity and sedentary lifestyle. The current treatment provides moderate improvement.  Hyperlipidemia This is a chronic problem. The current episode started more than 1 year ago. The problem is uncontrolled. Recent lipid tests were reviewed and are high. Exacerbating diseases include obesity. Pertinent negatives include no shortness of breath. Current antihyperlipidemic treatment includes diet change. The current treatment provides moderate improvement of lipids. Risk factors for coronary artery disease include dyslipidemia, hypertension and a sedentary lifestyle.      Review of Systems  Constitutional:  Negative for malaise/fatigue.  Respiratory:  Negative for shortness of breath.   All other systems reviewed and are negative.      Objective:   Physical Exam Vitals reviewed.  Constitutional:      General: She is not in acute distress.    Appearance: She is well-developed. She is obese.  HENT:     Head: Normocephalic and atraumatic.     Right Ear: Tympanic membrane normal.      Left Ear: Tympanic membrane normal.  Eyes:     Pupils: Pupils are equal, round, and reactive to light.  Neck:     Thyroid: No thyromegaly.  Cardiovascular:     Rate and Rhythm: Normal rate and regular rhythm.     Heart sounds: Normal heart sounds. No murmur heard. Pulmonary:     Effort: Pulmonary effort is normal. No respiratory distress.     Breath sounds: Normal breath sounds. No wheezing.  Abdominal:     General: Bowel sounds are normal. There is no distension.     Palpations: Abdomen is soft.     Tenderness: There is no abdominal tenderness.  Musculoskeletal:        General: No tenderness. Normal range of motion.     Cervical back: Normal range of motion and neck supple.  Skin:    General: Skin is warm and dry.  Neurological:     Mental Status: She is alert and oriented to person, place, and time.     Cranial Nerves: No cranial nerve deficit.     Deep Tendon Reflexes: Reflexes are normal and symmetric.  Psychiatric:        Behavior: Behavior normal.        Thought Content: Thought content normal.        Judgment: Judgment normal.       BP (!) 139/96   Pulse 66   Temp (!) 97.4 F (36.3 C) (Temporal)   Ht 5\' 6"  (  1.676 m)   Wt 212 lb (96.2 kg)   LMP 01/06/2017 (Exact Date)   SpO2 99%   BMI 34.22 kg/m      Assessment & Plan:  Carol Vargas comes in today with chief complaint of Medical Management of Chronic Issues   Diagnosis and orders addressed:  1. Primary hypertension  2. Mixed hyperlipidemia  3. Obesity (BMI 30-39.9) - phentermine (ADIPEX-P) 37.5 MG tablet; Take 1 tablet (37.5 mg total) by mouth daily before breakfast.  Dispense: 30 tablet; Refill: 2  4. Venous insufficiency (chronic) (peripheral)  5. Weight loss counseling, encounter for - phentermine (ADIPEX-P) 37.5 MG tablet; Take 1 tablet (37.5 mg total) by mouth daily before breakfast.  Dispense: 30 tablet; Refill: 2   Labs pending Labs reviewed from 11/12/22 Pt will monitor BP at home,  if >140/90 will need to adjust medications, may need to hold phentermine.  -Dash diet information given -Exercise encouraged - Stress Management  Health Maintenance reviewed Diet and exercise encouraged  Follow up plan: 3 months   Jannifer Rodney, FNP

## 2023-01-17 NOTE — Patient Instructions (Signed)
Hypertension, Adult High blood pressure (hypertension) is when the force of blood pumping through the arteries is too strong. The arteries are the blood vessels that carry blood from the heart throughout the body. Hypertension forces the heart to work harder to pump blood and may cause arteries to become narrow or stiff. Untreated or uncontrolled hypertension can lead to a heart attack, heart failure, a stroke, kidney disease, and other problems. A blood pressure reading consists of a higher number over a lower number. Ideally, your blood pressure should be below 120/80. The first ("top") number is called the systolic pressure. It is a measure of the pressure in your arteries as your heart beats. The second ("bottom") number is called the diastolic pressure. It is a measure of the pressure in your arteries as the heart relaxes. What are the causes? The exact cause of this condition is not known. There are some conditions that result in high blood pressure. What increases the risk? Certain factors may make you more likely to develop high blood pressure. Some of these risk factors are under your control, including: Smoking. Not getting enough exercise or physical activity. Being overweight. Having too much fat, sugar, calories, or salt (sodium) in your diet. Drinking too much alcohol. Other risk factors include: Having a personal history of heart disease, diabetes, high cholesterol, or kidney disease. Stress. Having a family history of high blood pressure and high cholesterol. Having obstructive sleep apnea. Age. The risk increases with age. What are the signs or symptoms? High blood pressure may not cause symptoms. Very high blood pressure (hypertensive crisis) may cause: Headache. Fast or irregular heartbeats (palpitations). Shortness of breath. Nosebleed. Nausea and vomiting. Vision changes. Severe chest pain, dizziness, and seizures. How is this diagnosed? This condition is diagnosed by  measuring your blood pressure while you are seated, with your arm resting on a flat surface, your legs uncrossed, and your feet flat on the floor. The cuff of the blood pressure monitor will be placed directly against the skin of your upper arm at the level of your heart. Blood pressure should be measured at least twice using the same arm. Certain conditions can cause a difference in blood pressure between your right and left arms. If you have a high blood pressure reading during one visit or you have normal blood pressure with other risk factors, you may be asked to: Return on a different day to have your blood pressure checked again. Monitor your blood pressure at home for 1 week or longer. If you are diagnosed with hypertension, you may have other blood or imaging tests to help your health care provider understand your overall risk for other conditions. How is this treated? This condition is treated by making healthy lifestyle changes, such as eating healthy foods, exercising more, and reducing your alcohol intake. You may be referred for counseling on a healthy diet and physical activity. Your health care provider may prescribe medicine if lifestyle changes are not enough to get your blood pressure under control and if: Your systolic blood pressure is above 130. Your diastolic blood pressure is above 80. Your personal target blood pressure may vary depending on your medical conditions, your age, and other factors. Follow these instructions at home: Eating and drinking  Eat a diet that is high in fiber and potassium, and low in sodium, added sugar, and fat. An example of this eating plan is called the DASH diet. DASH stands for Dietary Approaches to Stop Hypertension. To eat this way: Eat   plenty of fresh fruits and vegetables. Try to fill one half of your plate at each meal with fruits and vegetables. Eat whole grains, such as whole-wheat pasta, brown rice, or whole-grain bread. Fill about one  fourth of your plate with whole grains. Eat or drink low-fat dairy products, such as skim milk or low-fat yogurt. Avoid fatty cuts of meat, processed or cured meats, and poultry with skin. Fill about one fourth of your plate with lean proteins, such as fish, chicken without skin, beans, eggs, or tofu. Avoid pre-made and processed foods. These tend to be higher in sodium, added sugar, and fat. Reduce your daily sodium intake. Many people with hypertension should eat less than 1,500 mg of sodium a day. Do not drink alcohol if: Your health care provider tells you not to drink. You are pregnant, may be pregnant, or are planning to become pregnant. If you drink alcohol: Limit how much you have to: 0-1 drink a day for women. 0-2 drinks a day for men. Know how much alcohol is in your drink. In the [place] one drink equals one 12 oz bottle of beer (355 mL), one 5 oz glass of wine (148 mL), or one 1 oz glass of hard liquor (44 mL). Lifestyle  Work with your health care provider to maintain a healthy body weight or to lose weight. Ask what an ideal weight is for you. Get at least 30 minutes of exercise that causes your heart to beat faster (aerobic exercise) most days of the week. Activities may include walking, swimming, or biking. Include exercise to strengthen your muscles (resistance exercise), such as Pilates or lifting weights, as part of your weekly exercise routine. Try to do these types of exercises for 30 minutes at least 3 days a week. Do not use any products that contain nicotine or tobacco. These products include cigarettes, chewing tobacco, and vaping devices, such as e-cigarettes. If you need help quitting, ask your health care provider. Monitor your blood pressure at home as told by your health care provider. Keep all follow-up visits. This is important. Medicines Take over-the-counter and prescription medicines only as told by your health care provider. Follow directions carefully. Blood  pressure medicines must be taken as prescribed. Do not skip doses of blood pressure medicine. Doing this puts you at risk for problems and can make the medicine less effective. Ask your health care provider about side effects or reactions to medicines that you should watch for. Contact a health care provider if you: Think you are having a reaction to a medicine you are taking. Have headaches that keep coming back (recurring). Feel dizzy. Have swelling in your ankles. Have trouble with your vision. Get help right away if you: Develop a severe headache or confusion. Have unusual weakness or numbness. Feel faint. Have severe pain in your chest or abdomen. Vomit repeatedly. Have trouble breathing. These symptoms may be an emergency. Get help right away. Call 911. Do not wait to see if the symptoms will go away. Do not drive yourself to the hospital. Summary Hypertension is when the force of blood pumping through your arteries is too strong. If this condition is not controlled, it may put you at risk for serious complications. Your personal target blood pressure may vary depending on your medical conditions, your age, and other factors. For most people, a normal blood pressure is less than 120/80. Hypertension is treated with lifestyle changes, medicines, or a combination of both. Lifestyle changes include losing weight, eating a healthy,   low-sodium diet, exercising more, and limiting alcohol. This information is not intended to replace advice given to you by your health care provider. Make sure you discuss any questions you have with your health care provider. Document Revised: 05/02/2021 Document Reviewed: 05/02/2021 Elsevier Patient Education  2024 [employer]  

## 2023-02-19 DIAGNOSIS — L299 Pruritus, unspecified: Secondary | ICD-10-CM | POA: Diagnosis not present

## 2023-02-19 DIAGNOSIS — I872 Venous insufficiency (chronic) (peripheral): Secondary | ICD-10-CM | POA: Diagnosis not present

## 2023-02-19 DIAGNOSIS — R600 Localized edema: Secondary | ICD-10-CM | POA: Diagnosis not present

## 2023-02-19 DIAGNOSIS — R252 Cramp and spasm: Secondary | ICD-10-CM | POA: Diagnosis not present

## 2023-02-20 DIAGNOSIS — I83891 Varicose veins of right lower extremities with other complications: Secondary | ICD-10-CM | POA: Diagnosis not present

## 2023-03-19 DIAGNOSIS — I87391 Chronic venous hypertension (idiopathic) with other complications of right lower extremity: Secondary | ICD-10-CM | POA: Diagnosis not present

## 2023-03-19 DIAGNOSIS — R600 Localized edema: Secondary | ICD-10-CM | POA: Diagnosis not present

## 2023-03-19 DIAGNOSIS — R252 Cramp and spasm: Secondary | ICD-10-CM | POA: Diagnosis not present

## 2023-03-19 DIAGNOSIS — L299 Pruritus, unspecified: Secondary | ICD-10-CM | POA: Diagnosis not present

## 2023-03-19 DIAGNOSIS — I872 Venous insufficiency (chronic) (peripheral): Secondary | ICD-10-CM | POA: Diagnosis not present

## 2023-03-21 DIAGNOSIS — R600 Localized edema: Secondary | ICD-10-CM | POA: Diagnosis not present

## 2023-03-27 DIAGNOSIS — I83891 Varicose veins of right lower extremities with other complications: Secondary | ICD-10-CM | POA: Diagnosis not present

## 2023-05-22 DIAGNOSIS — I83891 Varicose veins of right lower extremities with other complications: Secondary | ICD-10-CM | POA: Diagnosis not present

## 2023-06-05 DIAGNOSIS — I83891 Varicose veins of right lower extremities with other complications: Secondary | ICD-10-CM | POA: Diagnosis not present

## 2023-06-30 ENCOUNTER — Other Ambulatory Visit: Payer: Self-pay | Admitting: Family

## 2023-06-30 DIAGNOSIS — Z713 Dietary counseling and surveillance: Secondary | ICD-10-CM

## 2023-06-30 DIAGNOSIS — E669 Obesity, unspecified: Secondary | ICD-10-CM

## 2023-07-20 ENCOUNTER — Other Ambulatory Visit: Payer: Self-pay | Admitting: Family

## 2023-07-20 DIAGNOSIS — I10 Essential (primary) hypertension: Secondary | ICD-10-CM

## 2023-08-05 ENCOUNTER — Encounter: Payer: Self-pay | Admitting: Family

## 2023-08-05 ENCOUNTER — Other Ambulatory Visit: Payer: Self-pay | Admitting: Family

## 2023-08-05 DIAGNOSIS — I10 Essential (primary) hypertension: Secondary | ICD-10-CM

## 2023-08-05 NOTE — Telephone Encounter (Signed)
Letter sent.

## 2023-08-05 NOTE — Telephone Encounter (Signed)
[facility] pt NTBS 30-d given 07/22/23

## 2023-12-06 ENCOUNTER — Other Ambulatory Visit: Payer: Self-pay | Admitting: Family

## 2023-12-06 DIAGNOSIS — I10 Essential (primary) hypertension: Secondary | ICD-10-CM

## 2024-02-14 ENCOUNTER — Other Ambulatory Visit: Payer: Self-pay | Admitting: Medical Genetics

## 2024-02-16 ENCOUNTER — Other Ambulatory Visit: Payer: Self-pay | Admitting: Family

## 2024-02-16 DIAGNOSIS — I10 Essential (primary) hypertension: Secondary | ICD-10-CM

## 2024-02-17 NOTE — Telephone Encounter (Signed)
 Scheduled appt for aug 18

## 2024-02-17 NOTE — Addendum Note (Signed)
 Addended by: Geisha Abernathy D on: 02/17/2024 04:35 PM   Modules accepted: Orders

## 2024-02-17 NOTE — Telephone Encounter (Signed)
 [facility] NTBS last OV 01/17/23 NO RF sent to pharmacy last OV greater than a year

## 2024-02-18 MED ORDER — LOSARTAN POTASSIUM-HCTZ 100-25 MG PO TABS: 1.0000 | ORAL_TABLET | Freq: Every day | ORAL | 0 refills | Status: DC

## 2024-02-24 ENCOUNTER — Ambulatory Visit: Admitting: Family

## 2024-03-13 ENCOUNTER — Ambulatory Visit: Admitting: Family

## 2024-03-19 ENCOUNTER — Encounter: Payer: Self-pay | Admitting: Family

## 2024-03-19 ENCOUNTER — Ambulatory Visit: Admitting: Family

## 2024-03-19 DIAGNOSIS — Z713 Dietary counseling and surveillance: Secondary | ICD-10-CM

## 2024-03-19 DIAGNOSIS — S161XXA Strain of muscle, fascia and tendon at neck level, initial encounter: Secondary | ICD-10-CM

## 2024-03-19 DIAGNOSIS — M542 Cervicalgia: Secondary | ICD-10-CM

## 2024-03-19 DIAGNOSIS — I10 Essential (primary) hypertension: Secondary | ICD-10-CM

## 2024-03-19 DIAGNOSIS — E669 Obesity, unspecified: Secondary | ICD-10-CM

## 2024-04-28 ENCOUNTER — Other Ambulatory Visit: Payer: Self-pay | Admitting: Medical Genetics

## 2024-04-28 DIAGNOSIS — Z006 Encounter for examination for normal comparison and control in clinical research program: Secondary | ICD-10-CM

## 2024-05-06 ENCOUNTER — Telehealth: Payer: Self-pay | Admitting: Nurse Practitioner

## 2024-05-06 ENCOUNTER — Encounter: Payer: Self-pay | Admitting: Nurse Practitioner

## 2024-05-06 DIAGNOSIS — F321 Major depressive disorder, single episode, moderate: Secondary | ICD-10-CM

## 2024-05-06 MED ORDER — BUPROPION HCL ER (SR) 100 MG PO TB12: 100.0000 mg | ORAL_TABLET | Freq: Two times a day (BID) | ORAL | 1 refills | Status: DC

## 2024-05-06 NOTE — Progress Notes (Signed)
 Virtual Visit via video Note Due to COVID-19 pandemic this visit was conducted virtually. This visit type was conducted due to national recommendations for restrictions regarding the COVID-19 Pandemic (e.g. social distancing, sheltering in place) in an effort to limit this patient's exposure and mitigate transmission in our community. All issues noted in this document were discussed and addressed.  A physical exam was not performed with this format.   I connected with Carol Vargas on 05/06/2024 at 0948 and DOB and verified that I am speaking with the correct person using two identifiers. Carol Vargas is currently located at home during visit. The provider, Nena Deitra Morton Sebastian, DNP is located in their office at time of visit.  I discussed the limitations, risks, security and privacy concerns of performing an evaluation and management service by virtual visit and the availability of in person appointments. I also discussed with the patient that there may be a patient responsible charge related to this service. The patient expressed understanding and agreed to proceed.  Subjective:  Patient ID: Carol Vargas, female    DOB: 06-30-1976, 48 y.o.   MRN: [id number]  Chief Complaint:  trouble completing tasks ( Started a few months ago, where I am not able to complete tasks)   HPI: Carol Vargas is a 48 y.o. female presenting on 05/06/2024 for trouble completing tasks ( Started a few months ago, where I am not able to complete tasks)  Carol Vargas is a 48 year old female seen today via telehealth concern for not able to sleep, completing sample task.  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 She reports feeling easily distracted, often keeping 10-15 computer tabs open at once and switching  frequently between them without completing tasks. Although she is aware that this behavior interferes with her productivity, attempts to close all tabs and restart her work have not improved her focus or task completion.  She also reports significant fatigue in the mornings and difficulty falling asleep at night. Carol Vargas lives with her 34 year old child and is the primary caregiver for her 44 year old mother, which contributes to her stress and exhaustion.  During the encounter, Carol Vargas had noticeable difficulty maintaining her train of thought and required redirection several times. A PHQ-9 was completed--results are documented below.  She is agreeable to start Wellbutrin  and to follow-up with PCP in 4 weeks     05/06/2024    9:50 AM 11/12/2022    8:07 AM 01/19/2022   12:15 PM  PHQ9 SCORE ONLY  PHQ-9 Total Score 9 0  0      Data saved with a previous flowsheet row definition     Relevant past medical, surgical, family, and social history reviewed and updated as indicated.  Allergies and medications reviewed and updated.   Past Medical History:  Diagnosis Date   Arthritis    Back pain    Bilateral swelling of feet    Fibroids    Gallstone 2021   Headache(784.0)    at times, nothing severe per pt.menstral   High cholesterol    Hypertension    Joint pain    Sjogrens syndrome    Vitamin D  deficiency     Past Surgical History:  Procedure Laterality Date   ABDOMINAL HYSTERECTOMY     BACK SURGERY     L4 L5 fusion and instrumentation Jul 06 2020    BILATERAL SALPINGECTOMY Right 01/29/2017   Procedure: RIGHT SALPINGECTOMY;  Surgeon: Edsel Norleen GAILS, MD;  Location: [facility];  Service: Gynecology;  Laterality: Right;   BREAST REDUCTION SURGERY Bilateral 05/12/2019   Procedure: BILATERAL MAMMARY REDUCTION  (BREAST);  Surgeon: Elisabeth Craig RAMAN, MD;  Location: [facility];  Service: Plastics;  Laterality: Bilateral;  3.5 hours, please   CESAREAN SECTION      CHOLECYSTECTOMY N/A 05/24/2021   Procedure: LAPAROSCOPIC CHOLECYSTECTOMY;  Surgeon: Kallie Manuelita BROCKS, MD;  Location: [facility];  Service: General;  Laterality: N/A;   DILATION AND CURETTAGE OF UTERUS     EXTERNAL FIXATION LEG Right 07/05/2013   Procedure: EXTERNAL FIXATION LEG;  Surgeon: Kay Ozell Cummins, MD;  Location: [facility];  Service: Orthopedics;  Laterality: Right;   EXTERNAL FIXATION LEG Right 07/07/2013   Procedure: Ex-Fix Revision Right Tibial Plateau;  Surgeon: Ozell VEAR Bruch, MD;  Location: [facility] OR;  Service: Orthopedics;  Laterality: Right;   EXTERNAL FIXATION REMOVAL Right 07/28/2013   Procedure: REMOVAL EXTERNAL FIXATION LEG;  Surgeon: Ozell VEAR Bruch, MD;  Location: [facility];  Service: Orthopedics;  Laterality: Right;   HARDWARE REMOVAL Right 08/02/2014   Procedure: HARDWARE REMOVAL;  Surgeon: Marcey Raman, MD;  Location: [facility];  Service: Orthopedics;  Laterality: Right;   KNEE ARTHROSCOPY Right 08/02/2014   Procedure: ARTHROSCOPY KNEE;  Surgeon: Marcey Raman, MD;  Location: [facility] OR;  Service: Orthopedics;  Laterality: Right;   ORIF TIBIA PLATEAU Right 07/28/2013   Procedure: OPEN REDUCTION INTERNAL FIXATION (ORIF) RIGHT TIBIAL PLATEAU;  Surgeon: Ozell VEAR Bruch, MD;  Location: [facility];  Service: Orthopedics;  Laterality: Right;   SUPRACERVICAL ABDOMINAL HYSTERECTOMY N/A 01/29/2017   Procedure: HYSTERECTOMY SUPRACERVICAL ABDOMINAL;  Surgeon: Edsel Norleen GAILS, MD;  Location: [facility];  Service: Gynecology;  Laterality: N/A;   TONSILLECTOMY  1992   TOTAL KNEE ARTHROPLASTY Right 01/03/2015   Procedure: TOTAL KNEE ARTHROPLASTY;  Surgeon: Marcey Raman, MD;  Location: [facility];  Service: Orthopedics;  Laterality: Right;  former tibial plateau fracture orif with hardware removed 07/2014   TOTAL KNEE REVISION Right 11/08/2020   Procedure: RIGHT TOTAL KNEE REVISION;  Surgeon: Ernie Cough, MD;  Location: [facility];  Service: Orthopedics;  Laterality: Right;    Social History   Socioeconomic History   Marital  status: Divorced    Spouse name: Not on file   Number of children: Not on file   Years of education: Not on file   Highest education level: Not on file  Occupational History   Not on file  Tobacco Use   Smoking status: Former    Current packs/day: 0.00    Average packs/day: 0.5 packs/day for 5.0 years (2.5 ttl pk-yrs)    Types: Cigarettes    Start date: 02/07/2008    Quit date: 02/06/2013    Years since quitting: 11.2   Smokeless tobacco: Never  Vaping Use   Vaping status: Never Used  Substance and Sexual Activity   Alcohol use: No   Drug use: No   Sexual activity: Not Currently    Birth control/protection: Surgical    Comment: hyst  Other Topics Concern   Not on file  Social History Narrative   Not on file   Social Drivers of Health   Financial Resource Strain: Not on file  Food Insecurity: Not on file  Transportation Needs: Not on file  Physical Activity: Not on file  Stress: Not on file  Social Connections: Not on file  Intimate  Partner Violence: Not on file    Outpatient Encounter Medications as of 05/06/2024  Medication Sig   buPROPion  ER (WELLBUTRIN  SR) 100 MG 12 hr tablet Take 1 tablet (100 mg total) by mouth 2 (two) times daily.   amLODipine  (NORVASC ) 10 MG tablet Take 1 tablet (10 mg total) by mouth daily. **NEEDS TO BE SEEN BEFORE NEXT REFILL**   baclofen  (LIORESAL ) 10 MG tablet TAKE 1 TABLET BY MOUTH THREE TIMES A DAY   diclofenac  (VOLTAREN ) 75 MG EC tablet TAKE 1 TABLET BY MOUTH TWICE A DAY   losartan -hydrochlorothiazide  (HYZAAR) 100-25 MG tablet Take 1 tablet by mouth daily.   phentermine  (ADIPEX-P ) 37.5 MG tablet Take 1 tablet (37.5 mg total) by mouth daily before breakfast.   No facility-administered encounter medications on file as of 05/06/2024.    Allergies  Allergen Reactions   Lisinopril  Cough    Review of Systems  HENT:  Negative for congestion, sneezing and sore throat.   Respiratory:  Negative for chest tightness and shortness of breath.    Cardiovascular:  Negative for chest pain and palpitations.  Gastrointestinal:  Negative for diarrhea and nausea.  Skin:  Negative for color change, pallor and rash.  Neurological:  Negative for dizziness and facial asymmetry.  Psychiatric/Behavioral:  Positive for sleep disturbance.        Not able to stay on tasks         Observations/Objective: No vital signs or physical exam, this was a virtual health encounter.  Pt alert and oriented, answers all questions appropriately, and able to speak in full sentences.  Physical Exam Constitutional:      General: She is not in acute distress. HENT:     Head: Normocephalic and atraumatic.  Eyes:     Conjunctiva/sclera: Conjunctivae normal.     Pupils: Pupils are equal, round, and reactive to light.  Pulmonary:     Effort: Pulmonary effort is normal.  Neurological:     Mental Status: She is alert and oriented to person, place, and time.  Psychiatric:        Attention and Perception: Attention and perception normal.        Mood and Affect: Mood and affect normal.        Speech: Speech normal.        Behavior: Behavior normal. Behavior is cooperative.        Thought Content: Thought content normal. Thought content does not include homicidal or suicidal ideation. Thought content does not include homicidal or suicidal plan.        Cognition and Memory: Cognition normal. Memory is impaired.        Judgment: Judgment normal.      Assessment and Plan: Delainie was seen today for trouble completing tasks.  Diagnoses and all orders for this visit:  Current moderate episode of major depressive disorder, unspecified whether recurrent (HCC) -     buPROPion  ER (WELLBUTRIN  SR) 100 MG 12 hr tablet; Take 1 tablet (100 mg total) by mouth 2 (two) times daily.   Chaunta is a 49 year old female seen today for depression, no acute distress  Initiate Bupropion  (Wellbutrin ) 100 mg PO every morning to address decreased concentration, fatigue, and  low motivation. - Discussed potential side effects including insomnia, dry mouth, and increased anxiety. Patient verbalized understanding.  Non-pharmacological interventions -Encourage consistent sleep hygiene practices (set bedtime routine, limit screen time before bed, avoid caffeine in the evening). -Recommend structured daily schedule with prioritized task lists to improve focus and organization. -Encourage regular  physical activity and balanced nutrition to support mood and energy. -Recommend stress-management techniques such as deep breathing, mindfulness, or journaling. -Promote adequate social support and self-care, considering caregiver stress.   Follow Up Instructions: Return in about 4 weeks (around 06/03/2024) for With her PCP.    I discussed the assessment and treatment plan with the patient. The patient was provided an opportunity to ask questions and all were answered. The patient agreed with the plan and demonstrated an understanding of the instructions.   The patient was advised to call back or seek an in-person evaluation if the symptoms worsen or if the condition fails to improve as anticipated.  The above assessment and management plan was discussed with the patient. The patient verbalized understanding of and has agreed to the management plan. Patient is aware to call the clinic if they develop any new symptoms or if symptoms persist or worsen. Patient is aware when to return to the clinic for a follow-up visit. Patient educated on when it is appropriate to go to the emergency department.    I provided 15 minutes of time during this video encounter.   Rudransh Bellanca [place] Thompson, DNP [facility] [address] [place], [place] [address] [phone] 05/06/2024

## 2024-05-28 ENCOUNTER — Other Ambulatory Visit: Payer: Self-pay | Admitting: Nurse Practitioner

## 2024-05-28 DIAGNOSIS — F321 Major depressive disorder, single episode, moderate: Secondary | ICD-10-CM

## 2024-06-01 ENCOUNTER — Ambulatory Visit: Admitting: Family

## 2024-06-18 ENCOUNTER — Ambulatory Visit: Admitting: Family

## 2024-07-21 ENCOUNTER — Encounter: Payer: Self-pay | Admitting: Family

## 2024-07-21 ENCOUNTER — Ambulatory Visit (INDEPENDENT_AMBULATORY_CARE_PROVIDER_SITE_OTHER): Payer: Self-pay | Admitting: Family

## 2024-07-21 VITALS — BP 169/98 | HR 78 | Temp 96.8°F | Ht 66.0 in | Wt 227.6 lb

## 2024-07-21 DIAGNOSIS — F321 Major depressive disorder, single episode, moderate: Secondary | ICD-10-CM

## 2024-07-21 DIAGNOSIS — M25561 Pain in right knee: Secondary | ICD-10-CM

## 2024-07-21 DIAGNOSIS — I10 Essential (primary) hypertension: Secondary | ICD-10-CM

## 2024-07-21 DIAGNOSIS — G8929 Other chronic pain: Secondary | ICD-10-CM

## 2024-07-21 DIAGNOSIS — Z23 Encounter for immunization: Secondary | ICD-10-CM

## 2024-07-21 MED ORDER — AMLODIPINE BESYLATE 10 MG PO TABS: 10.0000 mg | ORAL_TABLET | Freq: Every day | ORAL | 1 refills | Status: AC

## 2024-07-21 MED ORDER — DICLOFENAC SODIUM 75 MG PO TBEC: 75.0000 mg | DELAYED_RELEASE_TABLET | Freq: Two times a day (BID) | ORAL | 2 refills | Status: AC

## 2024-07-21 MED ORDER — LOSARTAN POTASSIUM-HCTZ 100-25 MG PO TABS: 1.0000 | ORAL_TABLET | Freq: Every day | ORAL | 1 refills | Status: AC

## 2024-07-21 MED ORDER — BUPROPION HCL ER (SR) 150 MG PO TB12: 150.0000 mg | ORAL_TABLET | Freq: Two times a day (BID) | ORAL | 1 refills | Status: AC

## 2024-07-21 NOTE — Patient Instructions (Signed)

## 2024-07-21 NOTE — Progress Notes (Signed)
 "  Subjective:    Patient ID: Carol Vargas, female    DOB: 1976-03-28, 49 y.o.   MRN: [id number]  Chief Complaint  Patient presents with   Depression    Following up from visit with Nena.   PT presents to the office today for follow upon depression.   She reports she has not been able to go the gym like she was because she hurt her knee. This has caused increase anxiety with weight gain.   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   Her BP is elevated today, but has been out of her medications for the last month.    Hypertension This is a chronic problem. The current episode started more than 1 year ago. The problem has been gradually worsening since onset. The problem is uncontrolled. Associated symptoms include anxiety and malaise/fatigue. Pertinent negatives include no peripheral edema or shortness of breath. Risk factors for coronary artery disease include dyslipidemia, obesity and sedentary lifestyle. The current treatment provides moderate improvement.  Hyperlipidemia This is a chronic problem. The current episode started more than 1 year ago. The problem is uncontrolled. Recent lipid tests were reviewed and are high. Exacerbating diseases include obesity. Pertinent negatives include no shortness of breath. Current antihyperlipidemic treatment includes diet change. The current treatment provides moderate improvement of lipids. Risk factors for coronary artery disease include dyslipidemia, hypertension, a sedentary lifestyle and obesity.  Depression        This is a chronic problem.  The current episode started more than 1 year ago.   Associated symptoms include decreased concentration and restlessness.  Associated symptoms include no helplessness, no hopelessness and not sad.  Past medical history includes anxiety.   Anxiety Presents for follow-up visit. Symptoms include decreased concentration, depressed mood, excessive worry, nervous/anxious  behavior, obsessions and restlessness. Patient reports no shortness of breath. Symptoms occur most days.    Knee Pain  The incident occurred more than 1 week ago. There was no injury mechanism. The pain is present in the right knee. The quality of the pain is described as aching. The pain is at a severity of 6/10. The pain is moderate. The pain has been Intermittent since onset.      Review of Systems  Constitutional:  Positive for malaise/fatigue.  Respiratory:  Negative for shortness of breath.   Psychiatric/Behavioral:  Positive for decreased concentration and depression. The patient is nervous/anxious.   All other systems reviewed and are negative.  Family History  Problem Relation Age of Onset   Hypertension Mother    Diabetes Mellitus II Mother    Osteoporosis Mother    Fibroids Mother    Diabetes Mother    Hyperlipidemia Mother    Obesity Mother    Stroke Father        died from this   Hypertension Father    Lung cancer Maternal Aunt    Fibroids Sister    Social History   Socioeconomic History   Marital status: Divorced    Spouse name: Not on file   Number of children: Not on file   Years of education: Not on file   Highest education level: Not on file  Occupational History   Not on file  Tobacco Use   Smoking status: Former    Current packs/day: 0.00    Average packs/day: 0.5 packs/day for 5.0 years (2.5 ttl pk-yrs)    Types: Cigarettes    Start date: 02/07/2008    Quit date:  02/06/2013    Years since quitting: 11.4   Smokeless tobacco: Never  Vaping Use   Vaping status: Never Used  Substance and Sexual Activity   Alcohol use: No   Drug use: No   Sexual activity: Not Currently    Birth control/protection: Surgical    Comment: hyst  Other Topics Concern   Not on file  Social History Narrative   Not on file   Social Drivers of Health   Tobacco Use: Medium Risk (07/21/2024)   Patient History    Smoking Tobacco Use: Former    Smokeless Tobacco Use:  Never    Passive Exposure: Not on Actuary Strain: Not on file  Food Insecurity: Not on file  Transportation Needs: Not on file  Physical Activity: Not on file  Stress: Not on file  Social Connections: Not on file  Depression (PHQ2-9): Medium Risk (05/06/2024)   Depression (PHQ2-9)    PHQ-2 Score: 9  Alcohol Screen: Not on file  Housing: Not on file  Utilities: Not on file  Health Literacy: Not on file       Objective:   Physical Exam Vitals reviewed.  Constitutional:      General: She is not in acute distress.    Appearance: She is well-developed. She is obese.  HENT:     Head: Normocephalic and atraumatic.     Right Ear: Tympanic membrane normal.     Left Ear: Tympanic membrane normal.  Eyes:     Pupils: Pupils are equal, round, and reactive to light.  Neck:     Thyroid : No thyromegaly.  Cardiovascular:     Rate and Rhythm: Normal rate and regular rhythm.     Heart sounds: Normal heart sounds. No murmur heard. Pulmonary:     Effort: Pulmonary effort is normal. No respiratory distress.     Breath sounds: Normal breath sounds. No wheezing.  Abdominal:     General: Bowel sounds are normal. There is no distension.     Palpations: Abdomen is soft.     Tenderness: There is no abdominal tenderness.  Musculoskeletal:        General: No tenderness. Normal range of motion.     Cervical back: Normal range of motion and neck supple.  Skin:    General: Skin is warm and dry.  Neurological:     Mental Status: She is alert and oriented to person, place, and time.     Cranial Nerves: No cranial nerve deficit.     Deep Tendon Reflexes: Reflexes are normal and symmetric.  Psychiatric:        Behavior: Behavior normal.        Thought Content: Thought content normal.        Judgment: Judgment normal.       BP (!) 158/114   Pulse 78   Temp (!) 96.8 F (36 C) (Temporal)   Ht 5' 6 (1.676 m)   Wt 227 lb 9.6 oz (103.2 kg)   LMP 01/06/2017   SpO2 98%   BMI  36.74 kg/m      Assessment & Plan:  Carol Vargas comes in today with chief complaint of Depression (Following up from visit with Nena.)   Diagnosis and orders addressed:  1. Primary hypertension (Primary) Restart Norvasc  10 mg and Hyzaar 100-25 mg  -Daily blood pressure log given with instructions on how to fill out and told to bring to next visit -Dash diet information given -Exercise encouraged - Stress Management  -Continue current  meds - amLODipine  (NORVASC ) 10 MG tablet; Take 1 tablet (10 mg total) by mouth daily.  Dispense: 90 tablet; Refill: 1 - losartan -hydrochlorothiazide  (HYZAAR) 100-25 MG tablet; Take 1 tablet by mouth daily.  Dispense: 90 tablet; Refill: 1 - CMP14+EGFR  2. Current moderate episode of major depressive disorder, unspecified whether recurrent (HCC) Will increase Wellbutrin  SR to 150 mg from 100 mg Stress management  - buPROPion  (WELLBUTRIN  SR) 150 MG 12 hr tablet; Take 1 tablet (150 mg total) by mouth 2 (two) times daily.  Dispense: 180 tablet; Refill: 1 - CMP14+EGFR  3. Encounter for immunization - Flu vaccine trivalent PF, 6mos and older(Flulaval,Afluria,Fluarix,Fluzone) - CMP14+EGFR  4. Chronic pain of right knee Diclofenac  BID, no other NSAID's Rest ROM exercises  - diclofenac  (VOLTAREN ) 75 MG EC tablet; Take 1 tablet (75 mg total) by mouth 2 (two) times daily.  Dispense: 180 tablet; Refill: 2   Labs pending Restart Norvasc  10 mg and Hyzaar 100-25 mg  Will increase Wellbutrin  SR to 150 mg from 100 mg Will hold off on restarting phentermine  today, until BP is well controlled.   Stress Management  Health Maintenance reviewed Diet and exercise encouraged  Follow up plan: 1 month to recheck HTN, Depression, GAD. If stable may restart phentermine .   Bari Learn, FNP   "

## 2024-07-22 LAB — CMP14+EGFR
ALT: 16 IU/L (ref 0–32)
AST: 22 IU/L (ref 0–40)
Albumin: 4.4 g/dL (ref 3.9–4.9)
Alkaline Phosphatase: 131 IU/L — ABNORMAL HIGH (ref 41–116)
BUN/Creatinine Ratio: 12 (ref 9–23)
BUN: 10 mg/dL (ref 6–24)
Bilirubin Total: 0.3 mg/dL (ref 0.0–1.2)
CO2: 24 mmol/L (ref 20–29)
Calcium: 9.2 mg/dL (ref 8.7–10.2)
Chloride: 103 mmol/L (ref 96–106)
Creatinine, Ser: 0.82 mg/dL (ref 0.57–1.00)
Globulin, Total: 2.8 g/dL (ref 1.5–4.5)
Glucose: 83 mg/dL (ref 70–99)
Potassium: 3.7 mmol/L (ref 3.5–5.2)
Sodium: 143 mmol/L (ref 134–144)
Total Protein: 7.2 g/dL (ref 6.0–8.5)
eGFR: 87 mL/min/1.73

## 2024-07-23 ENCOUNTER — Ambulatory Visit: Payer: Self-pay | Admitting: Family

## 2024-08-21 ENCOUNTER — Ambulatory Visit: Payer: Self-pay | Admitting: Family
